# Patient Record
Sex: Male | Born: 1950 | Race: White | Hispanic: No | Smoking: Never smoker
Health system: Southern US, Community
[De-identification: ages and names within clinical notes are randomized; demographics above are authoritative.]

## PROBLEM LIST (undated history)

## (undated) DIAGNOSIS — K219 Gastro-esophageal reflux disease without esophagitis: Secondary | ICD-10-CM

## (undated) DIAGNOSIS — Z8673 Personal history of transient ischemic attack (TIA), and cerebral infarction without residual deficits: Secondary | ICD-10-CM

## (undated) DIAGNOSIS — I2510 Atherosclerotic heart disease of native coronary artery without angina pectoris: Secondary | ICD-10-CM

## (undated) DIAGNOSIS — F4310 Post-traumatic stress disorder, unspecified: Secondary | ICD-10-CM

## (undated) DIAGNOSIS — F319 Bipolar disorder, unspecified: Secondary | ICD-10-CM

## (undated) HISTORY — PX: CARDIAC CATHETERIZATION: SHX172

---

## 1999-12-12 ENCOUNTER — Encounter: Payer: Self-pay | Admitting: Cardiology

## 1999-12-12 ENCOUNTER — Ambulatory Visit (HOSPITAL_COMMUNITY): Admission: RE | Admit: 1999-12-12 | Discharge: 1999-12-12 | Payer: Self-pay | Admitting: Cardiology

## 2001-02-28 ENCOUNTER — Encounter: Payer: Self-pay | Admitting: Cardiology

## 2001-02-28 ENCOUNTER — Ambulatory Visit (HOSPITAL_COMMUNITY): Admission: RE | Admit: 2001-02-28 | Discharge: 2001-02-28 | Payer: Self-pay | Admitting: Cardiology

## 2001-03-01 ENCOUNTER — Ambulatory Visit (HOSPITAL_COMMUNITY): Admission: RE | Admit: 2001-03-01 | Discharge: 2001-03-01 | Payer: Self-pay | Admitting: Cardiology

## 2004-03-17 ENCOUNTER — Encounter: Admission: RE | Admit: 2004-03-17 | Discharge: 2004-03-17 | Payer: Self-pay | Admitting: Cardiovascular Disease

## 2004-09-13 ENCOUNTER — Emergency Department: Payer: Self-pay | Admitting: Unknown Physician Specialty

## 2004-09-13 ENCOUNTER — Other Ambulatory Visit: Payer: Self-pay

## 2004-09-18 ENCOUNTER — Encounter: Admission: RE | Admit: 2004-09-18 | Discharge: 2004-09-18 | Payer: Self-pay | Admitting: Cardiology

## 2005-02-05 ENCOUNTER — Encounter: Admission: RE | Admit: 2005-02-05 | Discharge: 2005-02-05 | Payer: Self-pay | Admitting: Cardiology

## 2005-10-23 ENCOUNTER — Emergency Department: Payer: Self-pay | Admitting: Unknown Physician Specialty

## 2009-01-28 ENCOUNTER — Inpatient Hospital Stay (HOSPITAL_COMMUNITY): Admission: EM | Admit: 2009-01-28 | Discharge: 2009-01-31 | Payer: Self-pay | Admitting: Emergency Medicine

## 2009-01-29 ENCOUNTER — Encounter (INDEPENDENT_AMBULATORY_CARE_PROVIDER_SITE_OTHER): Payer: Self-pay | Admitting: Cardiology

## 2009-01-30 ENCOUNTER — Encounter (INDEPENDENT_AMBULATORY_CARE_PROVIDER_SITE_OTHER): Payer: Self-pay | Admitting: Cardiovascular Disease

## 2009-01-31 ENCOUNTER — Encounter (INDEPENDENT_AMBULATORY_CARE_PROVIDER_SITE_OTHER): Payer: Self-pay | Admitting: Cardiology

## 2009-01-31 ENCOUNTER — Ambulatory Visit: Payer: Self-pay | Admitting: Vascular Surgery

## 2009-06-02 ENCOUNTER — Inpatient Hospital Stay (HOSPITAL_COMMUNITY): Admission: EM | Admit: 2009-06-02 | Discharge: 2009-06-02 | Payer: Self-pay | Admitting: Emergency Medicine

## 2010-02-23 ENCOUNTER — Encounter: Payer: Self-pay | Admitting: Neurology

## 2010-04-22 LAB — CBC
HCT: 43.4 % (ref 39.0–52.0)
Hemoglobin: 14.9 g/dL (ref 13.0–17.0)
MCHC: 34.5 g/dL (ref 30.0–36.0)
MCHC: 34.8 g/dL (ref 30.0–36.0)
MCV: 94.1 fL (ref 78.0–100.0)
RBC: 4.83 MIL/uL (ref 4.22–5.81)
RDW: 12.5 % (ref 11.5–15.5)
WBC: 6.5 10*3/uL (ref 4.0–10.5)
WBC: 9 10*3/uL (ref 4.0–10.5)

## 2010-04-22 LAB — GLUCOSE, CAPILLARY

## 2010-04-22 LAB — COMPREHENSIVE METABOLIC PANEL
AST: 20 U/L (ref 0–37)
Alkaline Phosphatase: 54 U/L (ref 39–117)
BUN: 28 mg/dL — ABNORMAL HIGH (ref 6–23)
Calcium: 8.9 mg/dL (ref 8.4–10.5)
Creatinine, Ser: 1.09 mg/dL (ref 0.4–1.5)
GFR calc non Af Amer: >60 mL/min (ref >60)
Glucose, Bld: 109 mg/dL — ABNORMAL HIGH (ref 70–99)
Sodium: 142 mEq/L (ref 135–145)

## 2010-04-22 LAB — DIFFERENTIAL
Lymphocytes Relative: 30 % (ref 12–46)
Lymphs Abs: 2.7 10*3/uL (ref 0.7–4.0)
Neutro Abs: 5.2 10*3/uL (ref 1.7–7.7)
Neutrophils Relative %: 58 % (ref 43–77)

## 2010-04-22 LAB — POCT CARDIAC MARKERS
CKMB, poc: <1 ng/mL — ABNORMAL LOW (ref 1.0–8.0)
Troponin i, poc: <0.05 ng/mL (ref 0.00–0.09)

## 2010-04-22 LAB — BASIC METABOLIC PANEL
BUN: 29 mg/dL — ABNORMAL HIGH (ref 6–23)
Chloride: 109 mEq/L (ref 96–112)
GFR calc Af Amer: >60 mL/min (ref >60)
Glucose, Bld: 109 mg/dL — ABNORMAL HIGH (ref 70–99)
Potassium: 3.8 mEq/L (ref 3.5–5.1)

## 2010-04-22 LAB — MAGNESIUM: Magnesium: 2.2 mg/dL (ref 1.5–2.5)

## 2010-05-05 LAB — COMPREHENSIVE METABOLIC PANEL
ALT: 27 U/L (ref 0–53)
ALT: 29 U/L (ref 0–53)
AST: 16 U/L (ref 0–37)
Albumin: 3.2 g/dL — ABNORMAL LOW (ref 3.5–5.2)
Alkaline Phosphatase: 40 U/L (ref 39–117)
Alkaline Phosphatase: 42 U/L (ref 39–117)
Calcium: 8.6 mg/dL (ref 8.4–10.5)
Chloride: 104 mEq/L (ref 96–112)
GFR calc Af Amer: >60 mL/min (ref >60)
Glucose, Bld: 146 mg/dL — ABNORMAL HIGH (ref 70–99)
Potassium: 3.5 mEq/L (ref 3.5–5.1)
Potassium: 3.7 mEq/L (ref 3.5–5.1)
Sodium: 141 mEq/L (ref 135–145)
Sodium: 142 mEq/L (ref 135–145)
Total Bilirubin: 0.6 mg/dL (ref 0.3–1.2)
Total Protein: 5.1 g/dL — ABNORMAL LOW (ref 6.0–8.3)
Total Protein: 5.5 g/dL — ABNORMAL LOW (ref 6.0–8.3)

## 2010-05-05 LAB — POCT I-STAT, CHEM 8
Creatinine, Ser: 1 mg/dL (ref 0.4–1.5)
Glucose, Bld: 100 mg/dL — ABNORMAL HIGH (ref 70–99)
HCT: 43 % (ref 39.0–52.0)
Hemoglobin: 14.6 g/dL (ref 13.0–17.0)
Potassium: 3.7 mEq/L (ref 3.5–5.1)
Sodium: 142 mEq/L (ref 135–145)
TCO2: 27 mmol/L (ref 0–100)

## 2010-05-05 LAB — LIPID PANEL
Cholesterol: 155 mg/dL (ref 0–200)
HDL: 43 mg/dL (ref >39)
LDL Cholesterol: 89 mg/dL (ref 0–99)
Total CHOL/HDL Ratio: 3.6 RATIO
Triglycerides: 117 mg/dL (ref <150)

## 2010-05-05 LAB — CBC
MCHC: 34.2 g/dL (ref 30.0–36.0)
RDW: 13.1 % (ref 11.5–15.5)

## 2010-05-05 LAB — PROTIME-INR: INR: 1.06 (ref 0.00–1.49)

## 2010-05-05 LAB — HEMOGLOBIN A1C: Hgb A1c MFr Bld: 5.9 % (ref 4.6–6.1)

## 2010-05-05 LAB — APTT: aPTT: 27 seconds (ref 24–37)

## 2010-06-20 NOTE — Cardiovascular Report (Signed)
[place]  Patient:    Austin Rhodes, Austin Rhodes Visit Number: [id number] MRN: [id number]          Service Type: CAT Location: [facility] 2874 [id number] Attending Physician:  Robynn Pane Dictated by:   Eduardo Osier Sharyn Lull, M.D. Proc. Date: 03/01/01 Admit Date:  03/01/2001 Discharge Date: 03/01/2001   CC:         Cardiac Catheterization Laboratory   Cardiac Catheterization  PROCEDURE: Left cardiac catheterization with selective right and left coronary angiography, left ventriculography via right groin using Judkins technique.  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 Denies any nausea, vomiting or diaphoresis. Denies PND, orthopnea, leg swelling. Denies palpitations, lightheadedness or syncope lately. States lately he gets short of breath and tired with minimal exertion. The patient underwent stress Cardiolite exercise for 9 minutes on Bruce protocol, complaints of retrosternal chest pain at peak of exercise. Peak heart rate achieved was 156 and peak blood pressure was 180/100. ECG showed minor ST depression in inferior leads. Cardiolite scan showed borderline inducible reversible ischemia in the anterior wall with EF of 65%. Due to recurrent typical anginal pain and mildly positive stress Cardiolite and multiple risk factors, the patient was advised for catheterization, possible PCI.  PAST MEDICAL HISTORY: As above.  PAST SURGICAL HISTORY: He had tonsillectomy many years ago.  MEDICATIONS AT HOME: 1. He is on Cozaar 100 mg p.o. q.d. 2. Norvasc was just recently added, 5 mg p.o. q.d. 3.  Enteric-coated aspirin 1 tablet daily. 4. Nitrostat sublingual p.r.n.  ALLERGIES: He is allergic to NOVOCAINE and he has intolerance to beta blockers.  SOCIAL HISTORY: He is married, works as ______.  No history of smoking nor alcohol abuse. Drinks alcohol socially.  FAMILY HISTORY: Father is alive, had three-vessel CAD with LV dysfunction and had stroke. He is hypertensive and diabetic and mother died of CV at the age of 59. Mother had hypertension. Two brothers and one sister in good health.  PHYSICAL EXAMINATION:  GENERAL: On examination, he is awake, alert and oriented x3 in no acute distress.  VITAL SIGNS: Blood pressure was 138/96, pulse was 70, regular.  HEENT: Conjunctiva pink.  NECK: Supple, no JVD, no bruits.  LUNGS: Lungs are clear to auscultation without rhonchi or rales.  CARDIOVASCULAR: S1 and S2 was normal. There was soft S4 gallop.  ABDOMEN: Soft.  Bowel sounds are present, nontender.  EXTREMITIES: There is no clubbing, cyanosis or edema.  IMPRESSION: New onset angina, positive stress Cardiolite, uncontrolled hypertension, positive family history of coronary artery disease.  Discussed with patient regarding various options of treatment, i.e., medical versus invasive, left catheterization, possible PCI, stenting, its risks, i.e., death, MI, stroke, need for emergency CABG, risk of restenosis, etc., and consented for PCI.  DESCRIPTION OF PROCEDURE: After obtaining the informed consent, the patient was brought to the catheterization lab and was placed on the fluoroscopy table.  The right groin was prepped and draped in the usual fashion. Xylocaine 2% was used for local anesthesia in the right groin. With the help of a thin-walled needle, a 6 French arterial sheath was placed. The sheath was aspirated and flushed. Next, a 6 French left Judkins catheter was advanced  over the wire under fluoroscopic guidance up to the ascending aorta. The wire was pulled out,  the catheter was aspirated and connected to the manifold. The catheter was further advanced and engaged into the left coronary ostium. Multiple views of the left system were taken.  Next, the catheter was disengaged and was pulled out over the wire and was replaced with a 6 French right Judkins catheter, which was advanced over the wire under fluoroscopic guidance up to the ascending aorta. The wire was pulled out, the catheter was aspirated and connected to the manifold. The catheter was further advanced and engaged into the right coronary ostium. Multiple views of the right system were taken.  Next, the catheter was disengaged and was pulled out over the wire and was replaced with a 6 French pigtail catheter, which was advanced over the wire under fluoroscopic guidance up to the ascending aorta. The wire was pulled out, the catheter was aspirated and connected to the manifold.  The catheter was further advanced across the aortic valve into the LV. LV pressures were recorded. Next, LV-graphy was done in 30-degree RAO position. Post-angiographic pressures were recorded from LV and then pullback pressures were recorded from the aorta. There was no gradient across the aortic valve. Next, the pigtail catheter was pulled out, the sheaths were aspirated and flushed.  FINDINGS:  LV showed good LV systolic function, EF of 60-65%.  Left main was patent.  LAD had multiple 10-15% sequential lesions in proximal and midportion. Diagonal #1 had 20-30% proximal and ostial stenosis. The vessel is small. Diagonal #2 to diagonal #4 are very small but are patent.  Left circumflex is patent proximally and tapers down in the AV groove after giving off large OM-2. OM-1 is small, which has 20-30% proximal stenosis which appears slightly hazy in one view. OM-2 is large which bifurcates into superior and inferior branches, which are patent.  RCA is patent. The patient has right dominant coronary  system.   Arteriotomy was closed with Perclose without any complications. The patient tolerated the procedure well and was transferred to recovery room in stable condition. Dictated by:   Eduardo Osier Sharyn Lull, M.D. Attending Physician:  Robynn Pane DD:  03/01/01 TD:  03/02/01 Job: [id number]

## 2011-03-01 ENCOUNTER — Other Ambulatory Visit: Payer: Self-pay | Admitting: Cardiology

## 2011-03-18 ENCOUNTER — Other Ambulatory Visit: Payer: Self-pay | Admitting: Cardiology

## 2011-06-30 ENCOUNTER — Other Ambulatory Visit: Payer: Self-pay | Admitting: Cardiology

## 2011-07-22 ENCOUNTER — Other Ambulatory Visit: Payer: Self-pay | Admitting: Cardiology

## 2015-05-09 ENCOUNTER — Emergency Department (HOSPITAL_COMMUNITY): Payer: Medicaid Other

## 2015-05-09 ENCOUNTER — Encounter (HOSPITAL_COMMUNITY): Payer: Self-pay

## 2015-05-09 ENCOUNTER — Observation Stay (HOSPITAL_COMMUNITY)
Admission: EM | Admit: 2015-05-09 | Discharge: 2015-05-10 | Disposition: A | Discharge status: Home / Self Care | Payer: Medicaid Other | Attending: Cardiology | Admitting: Cardiology

## 2015-05-09 DIAGNOSIS — R7303 Prediabetes: Secondary | ICD-10-CM | POA: Insufficient documentation

## 2015-05-09 DIAGNOSIS — D696 Thrombocytopenia, unspecified: Secondary | ICD-10-CM | POA: Diagnosis not present

## 2015-05-09 DIAGNOSIS — Z8673 Personal history of transient ischemic attack (TIA), and cerebral infarction without residual deficits: Secondary | ICD-10-CM | POA: Insufficient documentation

## 2015-05-09 DIAGNOSIS — K219 Gastro-esophageal reflux disease without esophagitis: Secondary | ICD-10-CM | POA: Insufficient documentation

## 2015-05-09 DIAGNOSIS — F4310 Post-traumatic stress disorder, unspecified: Secondary | ICD-10-CM | POA: Insufficient documentation

## 2015-05-09 DIAGNOSIS — F319 Bipolar disorder, unspecified: Secondary | ICD-10-CM | POA: Diagnosis not present

## 2015-05-09 DIAGNOSIS — R079 Chest pain, unspecified: Secondary | ICD-10-CM

## 2015-05-09 DIAGNOSIS — I25118 Atherosclerotic heart disease of native coronary artery with other forms of angina pectoris: Principal | ICD-10-CM | POA: Insufficient documentation

## 2015-05-09 DIAGNOSIS — Z79899 Other long term (current) drug therapy: Secondary | ICD-10-CM | POA: Insufficient documentation

## 2015-05-09 DIAGNOSIS — I959 Hypotension, unspecified: Secondary | ICD-10-CM

## 2015-05-09 DIAGNOSIS — I10 Essential (primary) hypertension: Secondary | ICD-10-CM | POA: Insufficient documentation

## 2015-05-09 HISTORY — DX: Atherosclerotic heart disease of native coronary artery without angina pectoris: I25.10

## 2015-05-09 HISTORY — DX: Gastro-esophageal reflux disease without esophagitis: K21.9

## 2015-05-09 HISTORY — DX: Bipolar disorder, unspecified: F31.9

## 2015-05-09 HISTORY — DX: Post-traumatic stress disorder, unspecified: F43.10

## 2015-05-09 LAB — BASIC METABOLIC PANEL
Anion gap: 7 (ref 5–15)
BUN: 16 mg/dL (ref 6–20)
CO2: 27 mmol/L (ref 22–32)
CREATININE: 1.4 mg/dL — AB (ref 0.61–1.24)
Calcium: 9.2 mg/dL (ref 8.9–10.3)
Chloride: 109 mmol/L (ref 101–111)
GFR, EST NON AFRICAN AMERICAN: 52 mL/min — AB (ref >60)
Glucose, Bld: 126 mg/dL — ABNORMAL HIGH (ref 65–99)
POTASSIUM: 4.8 mmol/L (ref 3.5–5.1)
SODIUM: 143 mmol/L (ref 135–145)

## 2015-05-09 LAB — CBG MONITORING, ED: GLUCOSE-CAPILLARY: 91 mg/dL (ref 65–99)

## 2015-05-09 LAB — CBC WITH DIFFERENTIAL/PLATELET
BASOS PCT: 0 %
Basophils Absolute: 0 10*3/uL (ref 0.0–0.1)
EOS ABS: 0.1 10*3/uL (ref 0.0–0.7)
EOS PCT: 1 %
HCT: 43.9 % (ref 39.0–52.0)
Hemoglobin: 14.2 g/dL (ref 13.0–17.0)
LYMPHS ABS: 1.3 10*3/uL (ref 0.7–4.0)
Lymphocytes Relative: 13 %
MCH: 29.6 pg (ref 26.0–34.0)
MCHC: 32.3 g/dL (ref 30.0–36.0)
MCV: 91.5 fL (ref 78.0–100.0)
Monocytes Absolute: 0.5 10*3/uL (ref 0.1–1.0)
Monocytes Relative: 5 %
NEUTROS PCT: 81 %
Neutro Abs: 7.9 10*3/uL — ABNORMAL HIGH (ref 1.7–7.7)
PLATELETS: 122 10*3/uL — AB (ref 150–400)
RBC: 4.8 MIL/uL (ref 4.22–5.81)
RDW: 13.3 % (ref 11.5–15.5)
WBC: 9.7 10*3/uL (ref 4.0–10.5)

## 2015-05-09 LAB — CBC
HEMATOCRIT: 40.9 % (ref 39.0–52.0)
HEMOGLOBIN: 13.2 g/dL (ref 13.0–17.0)
MCH: 29.5 pg (ref 26.0–34.0)
MCHC: 32.3 g/dL (ref 30.0–36.0)
MCV: 91.5 fL (ref 78.0–100.0)
Platelets: 123 10*3/uL — ABNORMAL LOW (ref 150–400)
RBC: 4.47 MIL/uL (ref 4.22–5.81)
RDW: 13.2 % (ref 11.5–15.5)
WBC: 8.1 10*3/uL (ref 4.0–10.5)

## 2015-05-09 LAB — VALPROIC ACID LEVEL: VALPROIC ACID LVL: 65 ug/mL (ref 50.0–100.0)

## 2015-05-09 LAB — PROTIME-INR
INR: 1.21 (ref 0.00–1.49)
PROTHROMBIN TIME: 15.5 s — AB (ref 11.6–15.2)

## 2015-05-09 LAB — COMPREHENSIVE METABOLIC PANEL
ALBUMIN: 2.9 g/dL — AB (ref 3.5–5.0)
ALK PHOS: 39 U/L (ref 38–126)
ALT: 11 U/L — AB (ref 17–63)
AST: 14 U/L — ABNORMAL LOW (ref 15–41)
Anion gap: 9 (ref 5–15)
BILIRUBIN TOTAL: 0.4 mg/dL (ref 0.3–1.2)
BUN: 15 mg/dL (ref 6–20)
CALCIUM: 8.8 mg/dL — AB (ref 8.9–10.3)
CO2: 24 mmol/L (ref 22–32)
CREATININE: 1.32 mg/dL — AB (ref 0.61–1.24)
Chloride: 108 mmol/L (ref 101–111)
GFR calc Af Amer: >60 mL/min (ref >60)
GFR calc non Af Amer: 56 mL/min — ABNORMAL LOW (ref >60)
GLUCOSE: 137 mg/dL — AB (ref 65–99)
Potassium: 3.9 mmol/L (ref 3.5–5.1)
SODIUM: 141 mmol/L (ref 135–145)
TOTAL PROTEIN: 4.8 g/dL — AB (ref 6.5–8.1)

## 2015-05-09 LAB — TROPONIN I: Troponin I: <0.03 ng/mL (ref <0.031)

## 2015-05-09 LAB — RAPID URINE DRUG SCREEN, HOSP PERFORMED
Amphetamines: NOT DETECTED
BARBITURATES: NOT DETECTED
BENZODIAZEPINES: NOT DETECTED
COCAINE: NOT DETECTED
OPIATES: NOT DETECTED
TETRAHYDROCANNABINOL: NOT DETECTED

## 2015-05-09 LAB — URINALYSIS, DIPSTICK ONLY
Bilirubin Urine: NEGATIVE
Glucose, UA: NEGATIVE mg/dL
Hgb urine dipstick: NEGATIVE
KETONES UR: NEGATIVE mg/dL
LEUKOCYTES UA: NEGATIVE
NITRITE: NEGATIVE
PH: 6.5 (ref 5.0–8.0)
Protein, ur: NEGATIVE mg/dL
SPECIFIC GRAVITY, URINE: 1.02 (ref 1.005–1.030)

## 2015-05-09 LAB — LACTIC ACID, PLASMA
Lactic Acid, Venous: 1.4 mmol/L (ref 0.5–2.0)
Lactic Acid, Venous: 1.9 mmol/L (ref 0.5–2.0)

## 2015-05-09 LAB — I-STAT TROPONIN, ED: Troponin i, poc: 0 ng/mL (ref 0.00–0.08)

## 2015-05-09 LAB — APTT: aPTT: 189 seconds — ABNORMAL HIGH (ref 24–37)

## 2015-05-09 LAB — MAGNESIUM: Magnesium: 2.1 mg/dL (ref 1.7–2.4)

## 2015-05-09 MED ORDER — HEPARIN (PORCINE) IN NACL 100-0.45 UNIT/ML-% IJ SOLN
1150.0000 [IU]/h | INTRAMUSCULAR | Status: DC
  Administered 2015-05-09: 1150 [IU]/h via INTRAVENOUS
  Filled 2015-05-09: qty 250

## 2015-05-09 MED ORDER — ASPIRIN 300 MG RE SUPP
300.0000 mg | RECTAL | Status: AC
  Filled 2015-05-09: qty 1

## 2015-05-09 MED ORDER — ASPIRIN 81 MG PO CHEW
324.0000 mg | CHEWABLE_TABLET | ORAL | Status: AC
  Administered 2015-05-09: 324 mg via ORAL
  Filled 2015-05-09: qty 4

## 2015-05-09 MED ORDER — METOPROLOL TARTRATE 12.5 MG HALF TABLET
12.5000 mg | ORAL_TABLET | Freq: Two times a day (BID) | ORAL | Status: DC
  Administered 2015-05-09 – 2015-05-10 (×2): 12.5 mg via ORAL
  Filled 2015-05-09 (×2): qty 1

## 2015-05-09 MED ORDER — HEPARIN SODIUM (PORCINE) 5000 UNIT/ML IJ SOLN
5000.0000 [IU] | Freq: Three times a day (TID) | INTRAMUSCULAR | Status: DC
  Administered 2015-05-10: 5000 [IU] via SUBCUTANEOUS
  Filled 2015-05-09 (×2): qty 1

## 2015-05-09 MED ORDER — ONDANSETRON HCL 4 MG/2ML IJ SOLN: 4.0000 mg | Freq: Four times a day (QID) | INTRAMUSCULAR | Status: DC | PRN

## 2015-05-09 MED ORDER — HEPARIN BOLUS VIA INFUSION
4000.0000 [IU] | Freq: Once | INTRAVENOUS | Status: AC
  Administered 2015-05-09: 4000 [IU] via INTRAVENOUS
  Filled 2015-05-09: qty 4000

## 2015-05-09 MED ORDER — NITROGLYCERIN 0.4 MG SL SUBL: 0.4000 mg | SUBLINGUAL_TABLET | SUBLINGUAL | Status: DC | PRN

## 2015-05-09 MED ORDER — DIVALPROEX SODIUM ER 500 MG PO TB24
500.0000 mg | ORAL_TABLET | Freq: Two times a day (BID) | ORAL | Status: DC
  Administered 2015-05-09 – 2015-05-10 (×2): 500 mg via ORAL
  Filled 2015-05-09 (×2): qty 1

## 2015-05-09 MED ORDER — ROSUVASTATIN CALCIUM 10 MG PO TABS
10.0000 mg | ORAL_TABLET | Freq: Every day | ORAL | Status: DC
  Administered 2015-05-09 – 2015-05-10 (×2): 10 mg via ORAL
  Filled 2015-05-09 (×2): qty 1

## 2015-05-09 MED ORDER — SODIUM CHLORIDE 0.9 % IV SOLN
INTRAVENOUS | Status: DC
  Administered 2015-05-09: 17:00:00 via INTRAVENOUS

## 2015-05-09 MED ORDER — QUETIAPINE FUMARATE ER 200 MG PO TB24
200.0000 mg | ORAL_TABLET | Freq: Every day | ORAL | Status: DC
  Administered 2015-05-09: 200 mg via ORAL
  Filled 2015-05-09 (×2): qty 1

## 2015-05-09 MED ORDER — ACETAMINOPHEN 325 MG PO TABS: 650.0000 mg | ORAL_TABLET | ORAL | Status: DC | PRN

## 2015-05-09 MED ORDER — ASPIRIN EC 81 MG PO TBEC
81.0000 mg | DELAYED_RELEASE_TABLET | Freq: Every day | ORAL | Status: DC
  Administered 2015-05-10: 81 mg via ORAL
  Filled 2015-05-09: qty 1

## 2015-05-09 NOTE — ED Notes (Signed)
Pt states he has not been feeling well for several days.  C/o body aches yesterday.  States today was sitting at [employer] and he felt as if he was going to pass out, so laid down.  When EMS arrived, pt was pale, diaphoretic and c/o central chest pressure and R sided neck stiffness.  SBP was in 70's, O2 sats 72% ekg unremarkable, cbg 121.  Given 700 NS with bp improvement to sbp in 90's. Rectal temp 96.7.

## 2015-05-09 NOTE — ED Notes (Signed)
Pt given sandwich pack and ginger ale per MD approval.

## 2015-05-09 NOTE — Progress Notes (Signed)
ANTICOAGULATION CONSULT NOTE - Initial Consult  Pharmacy Consult for Heparin  Indication: chest pain/ACS  No Known Allergies  Patient Measurements: Weight: 195 lb (88.451 kg) (Patient reported ) Heparin Dosing Weight:   Vital Signs: Temp: 96.7 F (35.9 C) (04/06 1221) Temp Source: Rectal (04/06 1221) BP: 94/61 mmHg (04/06 1500) Pulse Rate: 69 (04/06 1500)  Labs:  Recent Labs  05/09/15 1235  HGB 14.2  HCT 43.9  PLT 122*  CREATININE 1.40*    CrCl cannot be calculated (Unknown ideal weight.).   Medical History: Past Medical History  Diagnosis Date  . PTSD (post-traumatic stress disorder)   . Coronary artery disease     Medications:   (Not in a hospital admission)  Assessment: [age] YOM who presented with chest pain and mild diaphoresis. Pharmacy consulted to start IV heparin for angina with rule out STEMI. H/H wnl. Plt low. He is not on any anticoagulation prior to admission   Goal of Therapy:  Heparin level 0.3-0.7 units/ml Monitor platelets by anticoagulation protocol: Yes   Plan:  -Heparin 4000 units IV heparin bolus followed by IV heparin at 1150 units/hr  -F/u 6 hr HL -Monitor CBC, renal fx, and s/s of bleeding    Vinnie LevelBenjamin Sherryn Pollino, PharmD., BCPS Clinical Pharmacist Pager [phone]

## 2015-05-09 NOTE — Progress Notes (Signed)
Pt a/o, no c/o pain, pt ambulated in hallway, pt frustrated IV pump was beeping and stated "it bothers me, I have PTSD, can we turn it off?" Dr. Sharyn LullHarwani paged and said OK to turn off and ordered SQ heparin to be started, pt now resting comfortably in bed, pt stable

## 2015-05-09 NOTE — ED Notes (Signed)
Attempted report 

## 2015-05-09 NOTE — ED Notes (Signed)
MD at bedside. 

## 2015-05-09 NOTE — ED Provider Notes (Signed)
CSN: [id number]     Arrival date & time 05/09/15  1157 History   First MD Initiated Contact with Patient 05/09/15 1215     Chief Complaint  Patient presents with  . Near Syncope  . Chest Pain     (Consider location/radiation/quality/duration/timing/severity/associated sxs/prior Treatment) Patient is a 65 y.o. male presenting with near-syncope and chest pain. The history is provided by the patient and a relative (daughter).  Near Syncope This is a recurrent (hx of vasovagal syncope) problem. The current episode started today. The problem has been resolved. Associated symptoms include chest pain, fatigue, myalgias and weakness. Pertinent negatives include no abdominal pain, diaphoresis, fever, headaches, nausea, rash, sore throat or vomiting. Treatments tried: IVF. The treatment provided significant relief.  Chest Pain Pain location:  Substernal area Pain quality: pressure   Pain radiates to:  L shoulder Pain radiates to the back: no   Pain severity:  Moderate Onset quality:  Sudden Duration:  45 minutes Timing:  Constant Progression:  Partially resolved Chronicity:  Recurrent Context: at rest   Associated symptoms: fatigue, near-syncope and weakness   Associated symptoms: no abdominal pain, no back pain, no diaphoresis, no dizziness, no dysphagia, no fever, no headache, no nausea, no palpitations, no shortness of breath and not vomiting     Past Medical History  Diagnosis Date  . PTSD (post-traumatic stress disorder)   . Coronary artery disease    Past Surgical History  Procedure Laterality Date  . Cardiac catheterization     History reviewed. No pertinent family history. Social History  Substance Use Topics  . Smoking status: Never Smoker   . Smokeless tobacco: None  . Alcohol Use: No    Review of Systems  Constitutional: Positive for fatigue. Negative for fever, diaphoresis, activity change and appetite change.  HENT: Negative for facial swelling, sore throat,  tinnitus, trouble swallowing and voice change.   Eyes: Negative for pain, redness and visual disturbance.  Respiratory: Negative for chest tightness, shortness of breath and wheezing.   Cardiovascular: Positive for chest pain and near-syncope. Negative for palpitations and leg swelling.  Gastrointestinal: Negative for nausea, vomiting, abdominal pain, diarrhea, constipation and abdominal distention.  Endocrine: Negative.   Genitourinary: Negative.  Negative for dysuria, decreased urine volume, scrotal swelling and testicular pain.  Musculoskeletal: Positive for myalgias. Negative for back pain and gait problem.  Skin: Negative.  Negative for rash.  Neurological: Positive for syncope and weakness. Negative for dizziness, tremors and headaches.  Psychiatric/Behavioral: Negative for suicidal ideas, hallucinations and self-injury. The patient is not nervous/anxious.       Allergies  Review of patient's allergies indicates no known allergies.  Home Medications   Prior to Admission medications   Medication Sig Start Date End Date Taking? Authorizing Provider  amLODipine (NORVASC) 5 MG tablet Take 5 mg by mouth daily.   Yes Historical Provider, MD  B Complex-Biotin-FA (B COMPLETE PO) Take 1 tablet by mouth daily.   Yes Historical Provider, MD  divalproex (DEPAKOTE ER) 500 MG 24 hr tablet Take 500 mg by mouth 2 (two) times daily. 05/07/15  Yes Historical Provider, MD  lamoTRIgine (LAMICTAL) 200 MG tablet Take 200 mg by mouth daily. 04/08/15  Yes Historical Provider, MD  losartan (COZAAR) 100 MG tablet Take 100 mg by mouth daily.   Yes Historical Provider, MD  metoprolol succinate (TOPROL-XL) 25 MG 24 hr tablet Take 25 mg by mouth daily.   Yes Historical Provider, MD  Multiple Vitamin (MULTIVITAMIN WITH MINERALS) TABS tablet Take 1 tablet  by mouth daily.   Yes Historical Provider, MD  rosuvastatin (CRESTOR) 10 MG tablet Take 10 mg by mouth daily.   Yes Historical Provider, MD  SEROQUEL XR 200 MG 24  hr tablet Take 200 mg by mouth at bedtime.  03/29/15  Yes Historical Provider, MD  tamsulosin (FLOMAX) 0.4 MG CAPS capsule Take 0.4 mg by mouth daily.   Yes Historical Provider, MD   BP 108/63 mmHg  Pulse 79  Temp(Src) 96.7 F (35.9 C) (Rectal)  Resp 22  SpO2 96% Physical Exam  Constitutional: He is oriented to person, place, and time. He appears well-developed and well-nourished. No distress.  HENT:  Head: Normocephalic and atraumatic.  Right Ear: External ear normal.  Left Ear: External ear normal.  Nose: Nose normal.  Eyes: Conjunctivae and EOM are normal. Pupils are equal, round, and reactive to light. No scleral icterus.  Neck: Normal range of motion. Neck supple. No JVD present. No tracheal deviation present. No thyromegaly present.  Cardiovascular: Normal rate, regular rhythm and intact distal pulses.   Pulmonary/Chest: Effort normal and breath sounds normal. No stridor. No respiratory distress. He has no wheezes. He has no rales.  Abdominal: Soft. He exhibits no distension. There is no tenderness. There is no rebound and no guarding.  Musculoskeletal: Normal range of motion. He exhibits no edema or tenderness.  Neurological: He is alert and oriented to person, place, and time. No cranial nerve deficit. He exhibits normal muscle tone. Coordination normal.  Skin: Skin is warm and dry. No rash noted. He is not diaphoretic.  Psychiatric: He has a normal mood and affect. His behavior is normal.  Nursing note and vitals reviewed.   ED Course  Procedures (including critical care time) Labs Review Labs Reviewed  CBC WITH DIFFERENTIAL/PLATELET - Abnormal; Notable for the following:    Platelets 122 (*)    Neutro Abs 7.9 (*)    All other components within normal limits  BASIC METABOLIC PANEL - Abnormal; Notable for the following:    Glucose, Bld 126 (*)    Creatinine, Ser 1.40 (*)    GFR calc non Af Amer 52 (*)    All other components within normal limits  LACTIC ACID, PLASMA   LACTIC ACID, PLASMA  URINALYSIS, DIPSTICK ONLY  URINE RAPID DRUG SCREEN, HOSP PERFORMED  CBG MONITORING, ED  Rosezena Sensor, ED    Imaging Review Dg Chest 2 View  05/09/2015  CLINICAL DATA:  Chest pain, PTSD EXAM: CHEST  2 VIEW COMPARISON:  06/01/2009 FINDINGS: Cardiomediastinal silhouette is stable. No acute infiltrate or pleural effusion. No pulmonary edema. Minimal degenerative changes mid thoracic spine. IMPRESSION: No active cardiopulmonary disease. Electronically Signed   By: Natasha Mead M.D.   On: 05/09/2015 13:18   I have personally reviewed and evaluated these images and lab results as part of my medical decision-making.   EKG Interpretation   Date/Time:  Thursday May 09 2015 12:10:12 EDT Ventricular Rate:  64 PR Interval:  190 QRS Duration: 98 QT Interval:  412 QTC Calculation: 425 R Axis:   83 Text Interpretation:  Normal sinus rhythm notching inferior leads  Confirmed by RAY MD, Duwayne Heck [phone]) on 05/09/2015 2:32:46 PM      MDM   Final diagnoses:  Chest pain, unspecified chest pain type  Hypotension, unspecified hypotension type    The patient is a 65 year old male with a history of bipolar disorder, previous episodes of vasovagal syncope, and mild coronary artery disease with previous catheterizations without PCI who presents with a  couple days of generalized mobilize and then near syncopal episode with chest pain earlier today. Patient was hypotensive with poor oxygen saturation per EMS however both of these problems resolved with 700 mL of normal saline. On arrival to the emergency department the patient is afebrile hemolytically stable. GCS of 15. Blood pressure of 112/75 on arrival. The patient received aspirin in route and reports significant improvement in his chest pain. EKG shows sinus rhythm with possible fascicular block and inferior leads but is otherwise unremarkable. Original troponin is undetectable. I spoke to the patient's cardiologist Dr. Sharyn Lull  who agrees to admit the patient for ACS rule out and further management of his hypertension. The patient has daughter expressed understanding and agreement with this plan.  Patient seen with attending, Dr. Rosalia Hammers, who oversaw clinical decision making.    Lula Olszewski, MD 05/09/15 1437  Margarita Grizzle, MD 05/09/15 [phone]

## 2015-05-09 NOTE — H&P (Signed)
Austin Rhodes is an 65 y.o. male.   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 Patient denies any syncopal episode. Patient states chest pain radiating to the left shoulder associated with mild diaphoresis patient was noted to be hypotensive with blood pressure in 70s by EMS received IV fluids with improvement in his blood pressure EKG done in the ED showed normal sinus rhythm with no acute ischemic changes first set of troponin I is normal. Patient denies any weakness in the arms or legs denies any slurred speech. Denies any recent changes in the medications. States lately he feels started and fatigue no energy.  Past Medical History  Diagnosis Date  . PTSD (post-traumatic stress disorder)   . Coronary artery disease     Past Surgical History  Procedure Laterality Date  . Cardiac catheterization      History reviewed. No pertinent family history. Social History:  reports that he has never smoked. He does not have any smokeless tobacco history on file. He reports that he does not drink alcohol. His drug history is not on file.  Allergies: No Known Allergies   (Not in a hospital admission)  Results for orders placed or performed during the hospital encounter of 05/09/15 (from the past 48 hour(s))  CBC with Differential     Status: Abnormal   Collection Time: 05/09/15 12:35 PM  Result Value Ref Range   WBC 9.7 4.0 - 10.5 K/uL   RBC 4.80 4.22 - 5.81 MIL/uL   Hemoglobin 14.2 13.0 - 17.0  g/dL   HCT 43.9 39.0 - 52.0 %   MCV 91.5 78.0 - 100.0 fL   MCH 29.6 26.0 - 34.0 pg   MCHC 32.3 30.0 - 36.0 g/dL   RDW 13.3 11.5 - 15.5 %   Platelets 122 (L) 150 - 400 K/uL   Neutrophils Relative % 81 %   Neutro Abs 7.9 (H) 1.7 - 7.7 K/uL   Lymphocytes Relative 13 %   Lymphs Abs 1.3 0.7 - 4.0 K/uL   Monocytes Relative 5 %   Monocytes Absolute 0.5 0.1 - 1.0 K/uL   Eosinophils Relative 1 %   Eosinophils Absolute 0.1 0.0 - 0.7 K/uL   Basophils Relative 0 %   Basophils Absolute 0.0 0.0 - 0.1 K/uL  Basic metabolic panel     Status: Abnormal   Collection Time: 05/09/15 12:35 PM  Result Value Ref Range   Sodium 143 135 - 145 mmol/L   Potassium 4.8 3.5 - 5.1 mmol/L   Chloride 109 101 - 111 mmol/L   CO2 27 22 - 32 mmol/L   Glucose, Bld 126 (H) 65 - 99 mg/dL   BUN 16 6 - 20 mg/dL   Creatinine, Ser 1.40 (H) 0.61 - 1.24 mg/dL   Calcium 9.2 8.9 - 10.3 mg/dL   GFR calc non Af Amer 52 (L) >60 mL/min   GFR calc Af Amer >60 >60 mL/min    Comment: (NOTE) The eGFR has been calculated using the CKD EPI equation. This calculation has not been validated in all clinical situations. eGFR's persistently <  60 mL/min signify possible Chronic Kidney Disease.    Anion gap 7 5 - 15  POC CBG, ED     Status: None   Collection Time: 05/09/15 12:42 PM  Result Value Ref Range   Glucose-Capillary 91 65 - 99 mg/dL  I-stat troponin, ED     Status: None   Collection Time: 05/09/15 12:44 PM  Result Value Ref Range   Troponin i, poc 0.00 0.00 - 0.08 ng/mL   Comment 3            Comment: Due to the release kinetics of cTnI, a negative result within the first hours of the onset of symptoms does not rule out myocardial infarction with certainty. If myocardial infarction is still suspected, repeat the test at appropriate intervals.    Dg Chest 2 View  05/09/2015  CLINICAL DATA:  Chest pain, PTSD EXAM: CHEST  2 VIEW COMPARISON:  06/01/2009 FINDINGS: Cardiomediastinal silhouette is stable. No acute infiltrate  or pleural effusion. No pulmonary edema. Minimal degenerative changes mid thoracic spine. IMPRESSION: No active cardiopulmonary disease. Electronically Signed   By: Lahoma Crocker M.D.   On: 05/09/2015 13:18    Review of Systems  Constitutional: Positive for malaise/fatigue and diaphoresis. Negative for fever and chills.  Eyes: Negative for double vision and photophobia.  Respiratory: Negative for shortness of breath.   Cardiovascular: Positive for chest pain. Negative for palpitations, orthopnea, claudication and leg swelling.  Gastrointestinal: Negative for nausea, vomiting and abdominal pain.  Genitourinary: Negative for dysuria.  Neurological: Positive for dizziness. Negative for headaches.    Blood pressure 108/63, pulse 79, temperature 96.7 F (35.9 C), temperature source Rectal, resp. rate 22, SpO2 96 %. Physical Exam  Constitutional: He is oriented to person, place, and time.  HENT:  Head: Normocephalic and atraumatic.  Eyes: Conjunctivae are normal. Pupils are equal, round, and reactive to light. Left eye exhibits no discharge. No scleral icterus.  Neck: Normal range of motion. Neck supple. No JVD present. No tracheal deviation present. No thyromegaly present.  Cardiovascular: Normal rate and regular rhythm.   Murmur (Soft systolic murmur noted no S3 gallop) heard. Respiratory: Effort normal and breath sounds normal. No respiratory distress. He has no wheezes. He has no rales.  GI: Bowel sounds are normal. He exhibits no distension. There is no tenderness. There is no rebound.  Musculoskeletal: He exhibits no edema or tenderness.  Neurological: He is alert and oriented to person, place, and time.     Assessment/Plan Chest pain worrisome for angina rule out MI Status post hypotension probably secondary to meds History of mild CAD in remote past Hypertension History of CVA in the past History of vasovagal syncope in the past GERD Bipolar disorder Posttraumatic stress  disorder Plan As per orders   Charolette Forward, MD 05/09/2015, 2:48 PM

## 2015-05-10 ENCOUNTER — Observation Stay (HOSPITAL_COMMUNITY): Payer: Medicaid Other

## 2015-05-10 DIAGNOSIS — I10 Essential (primary) hypertension: Secondary | ICD-10-CM | POA: Diagnosis not present

## 2015-05-10 DIAGNOSIS — R7303 Prediabetes: Secondary | ICD-10-CM | POA: Diagnosis not present

## 2015-05-10 DIAGNOSIS — I25118 Atherosclerotic heart disease of native coronary artery with other forms of angina pectoris: Secondary | ICD-10-CM | POA: Diagnosis not present

## 2015-05-10 DIAGNOSIS — F319 Bipolar disorder, unspecified: Secondary | ICD-10-CM | POA: Diagnosis not present

## 2015-05-10 LAB — TROPONIN I: Troponin I: <0.03 ng/mL (ref <0.031)

## 2015-05-10 LAB — CBC
HCT: 41.1 % (ref 39.0–52.0)
Hemoglobin: 13.2 g/dL (ref 13.0–17.0)
MCH: 29.3 pg (ref 26.0–34.0)
MCHC: 32.1 g/dL (ref 30.0–36.0)
MCV: 91.3 fL (ref 78.0–100.0)
PLATELETS: 122 10*3/uL — AB (ref 150–400)
RBC: 4.5 MIL/uL (ref 4.22–5.81)
RDW: 13.3 % (ref 11.5–15.5)
WBC: 6.6 10*3/uL (ref 4.0–10.5)

## 2015-05-10 LAB — BASIC METABOLIC PANEL
ANION GAP: 9 (ref 5–15)
BUN: 14 mg/dL (ref 6–20)
CO2: 25 mmol/L (ref 22–32)
Calcium: 8.8 mg/dL — ABNORMAL LOW (ref 8.9–10.3)
Chloride: 106 mmol/L (ref 101–111)
Creatinine, Ser: 1.1 mg/dL (ref 0.61–1.24)
Glucose, Bld: 99 mg/dL (ref 65–99)
POTASSIUM: 4 mmol/L (ref 3.5–5.1)
SODIUM: 140 mmol/L (ref 135–145)

## 2015-05-10 LAB — LIPID PANEL
CHOL/HDL RATIO: 2.4 ratio
CHOLESTEROL: 93 mg/dL (ref 0–200)
HDL: 39 mg/dL — AB (ref >40)
LDL Cholesterol: 39 mg/dL (ref 0–99)
TRIGLYCERIDES: 75 mg/dL (ref <150)
VLDL: 15 mg/dL (ref 0–40)

## 2015-05-10 LAB — LAMOTRIGINE LEVEL: Lamotrigine Lvl: 12.9 ug/mL (ref 2.0–20.0)

## 2015-05-10 LAB — HEPARIN LEVEL (UNFRACTIONATED): Heparin Unfractionated: <0.1 IU/mL — ABNORMAL LOW (ref 0.30–0.70)

## 2015-05-10 MED ORDER — CLOPIDOGREL BISULFATE 75 MG PO TABS: 75.0000 mg | ORAL_TABLET | Freq: Every day | ORAL | Status: DC

## 2015-05-10 MED ORDER — TECHNETIUM TC 99M SESTAMIBI GENERIC - CARDIOLITE
30.0000 | Freq: Once | INTRAVENOUS | Status: AC | PRN
  Administered 2015-05-10: 30 via INTRAVENOUS

## 2015-05-10 MED ORDER — REGADENOSON 0.4 MG/5ML IV SOLN
INTRAVENOUS | Status: AC
  Administered 2015-05-10: 0.4 mg via INTRAVENOUS
  Filled 2015-05-10: qty 5

## 2015-05-10 MED ORDER — REGADENOSON 0.4 MG/5ML IV SOLN
0.4000 mg | Freq: Once | INTRAVENOUS | Status: AC
  Administered 2015-05-10: 0.4 mg via INTRAVENOUS
  Filled 2015-05-10: qty 5

## 2015-05-10 MED ORDER — NITROGLYCERIN 0.4 MG SL SUBL: 0.4000 mg | SUBLINGUAL_TABLET | SUBLINGUAL | Status: DC | PRN

## 2015-05-10 MED ORDER — ASPIRIN 81 MG PO TBEC: 81.0000 mg | DELAYED_RELEASE_TABLET | Freq: Every day | ORAL | Status: DC

## 2015-05-10 MED ORDER — TECHNETIUM TC 99M SESTAMIBI GENERIC - CARDIOLITE
10.0000 | Freq: Once | INTRAVENOUS | Status: AC | PRN
  Administered 2015-05-10: 10 via INTRAVENOUS

## 2015-05-10 NOTE — Progress Notes (Signed)
7 mins, Dr. Sharyn LullHarwani at bedside, pt denies CP/SOB or abd discomfort. Testing has ended.

## 2015-05-10 NOTE — Progress Notes (Signed)
5 mins, Dr. Sharyn LullHarwani at bedside. Pt denies CP or abd discomfort. Pt states his SOB "has went away."

## 2015-05-10 NOTE — Progress Notes (Signed)
1 min, Dr. Sharyn Lullharwani at bedside, pt reports some SOB. Pt denies CP or abd discomfort

## 2015-05-10 NOTE — Progress Notes (Signed)
3 mins, Dr. Sharyn LullHarwani remains at bedside. Pt denies CP or abd discomfort. Pt also reports SOB has "leveled down."

## 2015-05-10 NOTE — Discharge Summary (Signed)
Discharge summary dictated on 05/10/2015, dictation number is [id number]

## 2015-05-10 NOTE — Progress Notes (Signed)
Patient given discharge instructions and all questions answered.  Patient stated that he goes to the [facility] in [place], [place].  Dr. Sharyn LullHarwani made aware.  Patient discharged with all belongings.

## 2015-05-10 NOTE — Progress Notes (Signed)
Pt has orders to be discharged. Discharge instructions given and pt has no additional questions at this time. Medication regimen reviewed and pt educated. Pt verbalized understanding and has no additional questions. Telemetry box removed. IV removed and site in good condition. Pt stable and waiting for transportation. 

## 2015-05-10 NOTE — Progress Notes (Signed)
Pre test vital signs

## 2015-05-10 NOTE — Discharge Instructions (Signed)
Angina Pectoris  Angina pectoris, often called angina, is extreme discomfort in the chest, neck, or arm. This is caused by a lack of blood in the middle and thickest layer of the heart wall (myocardium). There are four types of angina:  · Stable angina. Stable angina usually occurs in episodes of predictable frequency and duration. It is usually brought on by physical activity, stress, or excitement. Stable angina usually lasts a few minutes and can often be relieved by a medicine that you place under your tongue. This medicine is called sublingual nitroglycerin.  · Unstable angina. Unstable angina can occur even when you are doing little or no physical activity. It can even occur while you are sleeping or when you are at rest. It can suddenly increase in severity or frequency. It may not be relieved by sublingual nitroglycerin, and it can last up to 30 minutes.  · Microvascular angina. This type of angina is caused by a disorder of tiny blood vessels called arterioles. Microvascular angina is more common in women. The pain may be more severe and last longer than other types of angina pectoris.  · Prinzmetal or variant angina. This type of angina pectoris is rare and usually occurs when you are doing little or no physical activity. It especially occurs in the early morning hours.  CAUSES  Atherosclerosis is the cause of angina. This is the buildup of fat and cholesterol (plaque) on the inside of the arteries. Over time, the plaque may narrow or block the artery, and this will lessen blood flow to the heart. Plaque can also become weak and break off within a coronary artery to form a clot and cause a sudden blockage.  RISK FACTORS  Risk factors common to both men and women include:  · High cholesterol levels.  · High blood pressure (hypertension).  · Tobacco use.  · Diabetes.  · Family history of angina.  · Obesity.  · Lack of exercise.  · A diet high in saturated fats.  Women are at greater risk for angina if they  are:  · Over age 55.  · Postmenopausal.  SYMPTOMS  Many people do not experience any symptoms during the early stages of angina. As the condition progresses, symptoms common to both men and women may include:  · Chest pain.    The pain can be described as a crushing or squeezing in the chest, or a tightness, pressure, fullness, or heaviness in the chest.    The pain can last more than a few minutes, or it can stop and recur.  · Pain in the arms, neck, jaw, or back.  · Unexplained heartburn or indigestion.  · Shortness of breath.  · Nausea.  · Sudden cold sweats.  · Sudden light-headedness.  Many women have chest discomfort and some of the other symptoms. However, women often have different (atypical) symptoms, such as:   · Fatigue.  · Unexplained feelings of nervousness or anxiety.  · Unexplained weakness.  · Dizziness or fainting.  Sometimes, women may have angina without any symptoms.  DIAGNOSIS   Tests to diagnose angina may include:  · ECG (electrocardiogram).  · Exercise stress test. This looks for signs of blockage when the heart is being exercised.  · Pharmacologic stress test. This test looks for signs of blockage when the heart is being stressed with a medicine.  · Blood tests.  · Coronary angiogram. This is a procedure to look at the coronary arteries to see if there is any blockage.    TREATMENT   The treatment of angina may include the following:  · Healthy behavioral changes to reduce or control risk factors.  · Medicine.  · Coronary stenting. A stent helps to keep an artery open.  · Coronary angioplasty. This procedure widens a narrowed or blocked artery.  · Coronary artery bypass surgery. This will allow your blood to pass the blockage (bypass) to reach your heart.  HOME CARE INSTRUCTIONS   · Take medicines only as directed by your health care provider.  · Do not take the following medicines unless your health care provider approves:    Nonsteroidal anti-inflammatory drugs (NSAIDs), such as ibuprofen,  naproxen, or celecoxib.    Vitamin supplements that contain vitamin A, vitamin E, or both.    Hormone replacement therapy that contains estrogen with or without progestin.  · Manage other health conditions such as hypertension and diabetes as directed by your health care provider.  · Follow a heart-healthy diet. A dietitian can help to educate you about healthy food options and changes.  · Use healthy cooking methods such as roasting, grilling, broiling, baking, poaching, steaming, or stir-frying. Talk to a dietitian to learn more about healthy cooking methods.  · Follow an exercise program approved by your health care provider.  · Maintain a healthy weight. Lose weight as approved by your health care provider.  · Plan rest periods when fatigued.  · Learn to manage stress.  · Do not use any tobacco products, including cigarettes, chewing tobacco, or electronic cigarettes. If you need help quitting, ask your health care provider.  · If you drink alcohol, and your health care provider approves, limit your alcohol intake to no more than 1 drink per day. One drink equals 12 ounces of beer, 5 ounces of wine, or 1½ ounces of hard liquor.  · Stop illegal drug use.  · Keep all follow-up visits as directed by your health care provider. This is important.  SEEK IMMEDIATE MEDICAL CARE IF:   · You have pain in your chest, neck, arm, jaw, stomach, or back that lasts more than a few minutes, is recurring, or is unrelieved by taking sublingual nitroglycerin.  · You have profuse sweating without cause.  · You have unexplained:    Heartburn or indigestion.    Shortness of breath or difficulty breathing.    Nausea or vomiting.    Fatigue.    Feelings of nervousness or anxiety.    Weakness.    Diarrhea.  · You have sudden light-headedness or dizziness.  · You faint.  These symptoms may represent a serious problem that is an emergency. Do not wait to see if the symptoms will go away. Get medical help right away. Call your local  emergency services (911 in the [place] Do not drive yourself to the hospital.     This information is not intended to replace advice given to you by your health care provider. Make sure you discuss any questions you have with your health care provider.     Document Released: 01/19/2005 Document Revised: 02/09/2014 Document Reviewed: 05/23/2013  Elsevier Interactive Patient Education ©2016 [employer]

## 2015-05-11 NOTE — Discharge Summary (Signed)
NAME:  Austin Rhodes, Austin Rhodes            ACCOUNT NO.:  [id number]  MEDICAL RECORD NO.:  [id number]  LOCATION:  3E20C                        FACILITY:  [facility]  PHYSICIAN:  Jaylanni Eltringham N. Sharyn Lull, M.D. DATE OF BIRTH:  1950/09/01  DATE OF ADMISSION:  05/09/2015 DATE OF DISCHARGE:  05/10/2015                              DISCHARGE SUMMARY   APRIL 08, 2017ADMITTING DIAGNOSES: 1. Chest pain, worrisome for angina, rule out myocardial infarction. 2. Status post hypotension, probably secondary to meds. 3. History of mild CAD in remote past. 4. Hypertension. 5. History of cerebrovascular accident in the past. 6. History of vasovagal syncope in the past. 7. Gastroesophageal reflux disease. 8. Bipolar disorder. 9. Posttraumatic stress disorder. 10.Elevated blood sugar, rule out diabetes mellitus.  FINAL DIAGNOSES: 1. Stable angina, myocardial infarction ruled out, mildly positive     nuclear stress test. 2. Status post hypotension secondary to medication. 3. History of mild coronary artery disease in the remote past. 4. Hypertension. 5. History of cerebrovascular accident in the past. 6. History of vasovagal syncope in the past. 7. Gastroesophageal reflux disease. 8. Bipolar disorder. 9. History of posttraumatic stress disorder. 10.Prediabetes. 11.Thrombocytopenia, stable.  DISCHARGE HOME MEDICATIONS: 1. Aspirin 81 mg 1 tablet daily. 2. Clopidogrel 75 mg 1 tablet daily. 3. Nitrostat 0.4 mg sublingual use as directed. 4. B complete vitamin 1 tablet daily. 5. Depakote ER 500 mg twice daily. 6. Lamictal 200 mg daily. 7. Metoprolol succinate 25 mg a day daily. 8. Multivitamin with minerals 1 tablet daily. 9. Crestor 10 mg 1 tablet daily. 10.Seroquel XR 200 mg daily at bedtime. 11.Flomax 0.4 mg daily. The patient has been advised to stop amlodipine and losartan for now.  DIET:  Low-salt, low-cholesterol 1800 calories ADA diet.  The patient has been advised to avoid sweets and carbohydrates.   Monitor blood pressure daily.  FOLLOWUP:  Follow up with me in 1 week.  CONDITION AT DISCHARGE:  Stable.  BRIEF HISTORY AND HOSPITAL COURSE:  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  The patient denies any syncopal episode.  States chest pain, radiated to left shoulder associated with mild diaphoresis.  The patient was noted to be hypotensive with blood pressure in 70s by EMS, received IV fluid bolus with improvement in his blood pressure.  EKG done in the ED showed normal sinus rhythm with no acute ischemic changes.  First set of troponin I in the ED was normal.  The patient denies any weakness in the arms or legs.  Denies any slurred speech.  Denies any recent changes in medications.  He states lately he feels tired, fatigued, and no energy.  PHYSICAL EXAMINATION:  GENERAL:  He was alert, awake, oriented x3. VITAL SIGNS:  Blood pressure when seen in the ED was 108/63, pulse 79, he was  afebrile.  Conjunctiva was pink. NECK:  Supple.  No JVD.  No bruit. LUNGS:  Clear to auscultation without rhonchi or rales. CARDIOVASCULAR EXAM:  S1, S2 was normal.  There was no S3 gallop. ABDOMEN:  Soft.  Bowel sounds were present.  Nontender. EXTREMITIES:  There is no clubbing, cyanosis, or edema. NEUROLOGIC:  Grossly intact.  LABORATORY DATA:  Sodium was 143, potassium 4.8, BUN 16, creatinine 1.40.  His glucose was 126.  Repeat fasting blood sugar was 99 hemoglobin was 14.2, hematocrit 43.9, white  count of 9.7 chest x-ray showed no evidence of cardiopulmonary disease.  Repeat electrolytes, BUN was 15 creatinine 1.32, which is trending down, this morning BUN was 14, creatinine 1.10.  Fasting blood sugar was 99.  Three sets of troponin-I were negative.  His lactic acid level was normal 1.4, cholesterol was 93, triglycerides 75, HDL 39, LDL 39.  BRIEF HOSPITAL COURSE:  The patient was admitted to telemetry unit.  MI was ruled out by serial enzymes and EKG.  The patient did not have any episodes of chest pain during the hospital stay.  The patient subsequently underwent nuclear stress test, which showed equivocal finding involving the inferior wall and anterior wall and questionable reversibility in the basal segment of the anterior wall, which is also equivocal with normal wall motion and EF of 67%.  The patient is eager to go home.  The patient will be discharged home on above medications and will be followed up closely in my office in 1 week.  If he continues to have recurrent chest pain we will discuss with the patient regarding cardiac catheterization as outpatient.     Eduardo OsierMohan N. Sharyn LullHarwani, M.D.     [facility]  D:  05/10/2015  T:  05/11/2015  Job:  [id number]

## 2016-08-29 ENCOUNTER — Encounter: Payer: Self-pay | Admitting: Emergency Medicine

## 2016-08-29 ENCOUNTER — Emergency Department: Payer: Medicaid Other

## 2016-08-29 ENCOUNTER — Emergency Department
Admission: EM | Admit: 2016-08-29 | Discharge: 2016-08-31 | Payer: Medicaid Other | Attending: Emergency Medicine | Admitting: Emergency Medicine

## 2016-08-29 DIAGNOSIS — Z79899 Other long term (current) drug therapy: Secondary | ICD-10-CM | POA: Diagnosis not present

## 2016-08-29 DIAGNOSIS — F22 Delusional disorders: Secondary | ICD-10-CM | POA: Diagnosis not present

## 2016-08-29 DIAGNOSIS — I2510 Atherosclerotic heart disease of native coronary artery without angina pectoris: Secondary | ICD-10-CM | POA: Insufficient documentation

## 2016-08-29 DIAGNOSIS — Z7982 Long term (current) use of aspirin: Secondary | ICD-10-CM | POA: Insufficient documentation

## 2016-08-29 DIAGNOSIS — F29 Unspecified psychosis not due to a substance or known physiological condition: Secondary | ICD-10-CM

## 2016-08-29 LAB — URINE DRUG SCREEN, QUALITATIVE (ARMC ONLY)
AMPHETAMINES, UR SCREEN: NOT DETECTED
BENZODIAZEPINE, UR SCRN: NOT DETECTED
Barbiturates, Ur Screen: NOT DETECTED
Cannabinoid 50 Ng, Ur ~~LOC~~: NOT DETECTED
Cocaine Metabolite,Ur ~~LOC~~: NOT DETECTED
MDMA (ECSTASY) UR SCREEN: NOT DETECTED
METHADONE SCREEN, URINE: NOT DETECTED
OPIATE, UR SCREEN: NOT DETECTED
Phencyclidine (PCP) Ur S: NOT DETECTED
Tricyclic, Ur Screen: NOT DETECTED

## 2016-08-29 LAB — URINALYSIS, COMPLETE (UACMP) WITH MICROSCOPIC
BACTERIA UA: NONE SEEN
BILIRUBIN URINE: NEGATIVE
Glucose, UA: 50 mg/dL — AB
Hgb urine dipstick: NEGATIVE
Ketones, ur: 5 mg/dL — AB
LEUKOCYTES UA: NEGATIVE
Nitrite: NEGATIVE
Protein, ur: NEGATIVE mg/dL
SPECIFIC GRAVITY, URINE: 1.006 (ref 1.005–1.030)
SQUAMOUS EPITHELIAL / LPF: NONE SEEN
pH: 7 (ref 5.0–8.0)

## 2016-08-29 LAB — ACETAMINOPHEN LEVEL

## 2016-08-29 LAB — CARBAMAZEPINE LEVEL, TOTAL: Carbamazepine Lvl: <2 ug/mL — ABNORMAL LOW (ref 4.0–12.0)

## 2016-08-29 LAB — COMPREHENSIVE METABOLIC PANEL
ALBUMIN: 4.2 g/dL (ref 3.5–5.0)
ALT: 19 U/L (ref 17–63)
AST: 36 U/L (ref 15–41)
Alkaline Phosphatase: 49 U/L (ref 38–126)
Anion gap: 12 (ref 5–15)
BILIRUBIN TOTAL: 0.6 mg/dL (ref 0.3–1.2)
BUN: 14 mg/dL (ref 6–20)
CO2: 24 mmol/L (ref 22–32)
CREATININE: 1.28 mg/dL — AB (ref 0.61–1.24)
Calcium: 9.7 mg/dL (ref 8.9–10.3)
Chloride: 104 mmol/L (ref 101–111)
GFR calc Af Amer: >60 mL/min (ref >60)
GFR calc non Af Amer: 58 mL/min — ABNORMAL LOW (ref >60)
GLUCOSE: 120 mg/dL — AB (ref 65–99)
POTASSIUM: 3.9 mmol/L (ref 3.5–5.1)
SODIUM: 140 mmol/L (ref 135–145)
Total Protein: 6.9 g/dL (ref 6.5–8.1)

## 2016-08-29 LAB — CBC
HCT: 49.9 % (ref 40.0–52.0)
Hemoglobin: 16.9 g/dL (ref 13.0–18.0)
MCH: 30.3 pg (ref 26.0–34.0)
MCHC: 33.9 g/dL (ref 32.0–36.0)
MCV: 89.5 fL (ref 80.0–100.0)
PLATELETS: 189 10*3/uL (ref 150–440)
RBC: 5.58 MIL/uL (ref 4.40–5.90)
RDW: 13.6 % (ref 11.5–14.5)
WBC: 8.5 10*3/uL (ref 3.8–10.6)

## 2016-08-29 LAB — SALICYLATE LEVEL

## 2016-08-29 LAB — ETHANOL: Alcohol, Ethyl (B): <5 mg/dL (ref <5)

## 2016-08-29 LAB — VALPROIC ACID LEVEL: Valproic Acid Lvl: 65 ug/mL (ref 50.0–100.0)

## 2016-08-29 MED ORDER — QUETIAPINE FUMARATE ER 200 MG PO TB24
200.0000 mg | ORAL_TABLET | Freq: Every day | ORAL | Status: DC
  Administered 2016-08-29 – 2016-08-30 (×2): 200 mg via ORAL
  Filled 2016-08-29 (×4): qty 1

## 2016-08-29 MED ORDER — SODIUM CHLORIDE 0.9 % IV BOLUS (SEPSIS)
500.0000 mL | Freq: Once | INTRAVENOUS | Status: AC
  Administered 2016-08-29: 500 mL via INTRAVENOUS

## 2016-08-29 MED ORDER — METOPROLOL SUCCINATE ER 50 MG PO TB24
25.0000 mg | ORAL_TABLET | Freq: Every day | ORAL | Status: DC
  Administered 2016-08-29 – 2016-08-30 (×2): 25 mg via ORAL
  Filled 2016-08-29 (×2): qty 1

## 2016-08-29 MED ORDER — ASPIRIN EC 81 MG PO TBEC
81.0000 mg | DELAYED_RELEASE_TABLET | Freq: Every day | ORAL | Status: DC
  Administered 2016-08-29 – 2016-08-30 (×2): 81 mg via ORAL
  Filled 2016-08-29 (×2): qty 1

## 2016-08-29 MED ORDER — LORAZEPAM 2 MG/ML IJ SOLN
1.0000 mg | Freq: Once | INTRAMUSCULAR | Status: AC
  Administered 2016-08-29: 1 mg via INTRAVENOUS
  Filled 2016-08-29: qty 1

## 2016-08-29 MED ORDER — TAMSULOSIN HCL 0.4 MG PO CAPS
0.4000 mg | ORAL_CAPSULE | Freq: Every day | ORAL | Status: DC
Start: 2016-08-29 — End: 2016-08-31
  Administered 2016-08-29 – 2016-08-30 (×2): 0.4 mg via ORAL
  Filled 2016-08-29 (×2): qty 1

## 2016-08-29 MED ORDER — CLOPIDOGREL BISULFATE 75 MG PO TABS
75.0000 mg | ORAL_TABLET | Freq: Every day | ORAL | Status: DC
  Administered 2016-08-29 – 2016-08-30 (×2): 75 mg via ORAL
  Filled 2016-08-29 (×2): qty 1

## 2016-08-29 MED ORDER — DIVALPROEX SODIUM ER 250 MG PO TB24
500.0000 mg | ORAL_TABLET | Freq: Two times a day (BID) | ORAL | Status: DC
  Administered 2016-08-29 – 2016-08-30 (×4): 500 mg via ORAL
  Filled 2016-08-29 (×4): qty 2

## 2016-08-29 MED ORDER — LAMOTRIGINE 100 MG PO TABS
200.0000 mg | ORAL_TABLET | Freq: Every day | ORAL | Status: DC
  Administered 2016-08-29 – 2016-08-30 (×2): 200 mg via ORAL
  Filled 2016-08-29 (×2): qty 2

## 2016-08-29 NOTE — ED Notes (Addendum)
This tech heard movement in pts room and discovered the pt to be incredibly unsteady attempting to walk towards the head of his bed when I caught him just before he fell to the ground mid air. Pt st'd "My clothes are wet, Im looking for the bathroom"/ this EDT assisted pt back to bed and informed RN of the near miss. Pt was transferred to the restroom via wheelchair. Pt;s bed and clothing were dry. Pt was assisted to the toilet. Pt did not void or defecate. Pt was then transferred back to bed via wheelchair and was given a drink of water and 2 new blankets with a new linen change. A bed alarm has been placed on the pt. RN notified

## 2016-08-29 NOTE — ED Notes (Signed)
PT IVC/ PENDING SOC CONSULT  

## 2016-08-29 NOTE — ED Notes (Signed)
Pts dtr Austin FanningJulie called to check status of pt, informed her that [facility] and that we are still waiting on report to come back. Informed her pt has been calm since she left and we have not had any issues.

## 2016-08-29 NOTE — ED Notes (Signed)
Spoke with Dr. Mayford KnifeWilliams and informed him that pt too unsteady on his feet [facility]
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
## 2016-08-29 NOTE — ED Notes (Signed)
Spoke with pt dtr, Raynelle FanningJulie, and informed her that pt is now IVC, gave her a copy of visitation and phone call hours and password and discussed plan of care with her.  Raynelle FanningJulie request to be called with any updates

## 2016-08-29 NOTE — ED Notes (Signed)
Pt was given a sandwich tray and a beverage for a midnight snack. Pt does not need anything at time. Bed alarm still on pt, environment secure

## 2016-08-29 NOTE — BH Assessment (Signed)
Spoke with [facility] and they do not have any beds at this time for the patient.   
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
## 2016-08-29 NOTE — ED Notes (Signed)
TTS at bedside. 
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
## 2016-08-29 NOTE — ED Notes (Signed)
Pt dressed out by this RN and Allayne StackJann, ED Tech. Pt had 1 pair of shoes, 1 pair of shorts, 1 white t-shirt, 1 pair of gray boxers, 1 belt, 1 set of keys, 71 cents in change (2 quarters, 2 dimes, 1 penny). Belongings were placed in pt belongings bag and given to daughter, [name](Julie).
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
## 2016-08-30 MED ORDER — RISPERIDONE 1 MG PO TABS
0.5000 mg | ORAL_TABLET | Freq: Two times a day (BID) | ORAL | Status: DC
  Administered 2016-08-30 (×2): 0.5 mg via ORAL
  Filled 2016-08-30 (×2): qty 1

## 2016-08-30 NOTE — BH Assessment (Signed)
[facility].

## 2016-08-30 NOTE — ED Notes (Signed)
PT IVC PENDING PSYCH ADMISSION 

## 2016-08-30 NOTE — ED Notes (Signed)
PT IVC/ PENDING TRANSPORTATION TO [facility].

## 2016-08-30 NOTE — BH Assessment (Signed)
[facility] requesting updated vitals. Pt RN Biomedical scientist(Bill) informed.

## 2016-08-30 NOTE — BH Assessment (Signed)
Spoke with Gunnar FusiPaula from [facility] and the patient has been placed on the wait list.

## 2016-08-30 NOTE — BH Assessment (Signed)
Pt has been accepted to [facility]. Informed ED Secretary, Nurse Annette StableBill and EDP Mayford KnifeWilliams. [facility] Is Dr. Merilyn BabaUreh [place]. The phone number is [phone]. Delorise ShinerGrace was the representative.

## 2016-08-30 NOTE — ED Notes (Signed)
Daughter, Raynelle FanningJulie, called with update, msg passed to pt

## 2016-08-30 NOTE — ED Provider Notes (Signed)
-----------------------------------------   7:22 AM on 08/30/2016 -----------------------------------------   Blood pressure 124/84, pulse 72, temperature 98.7 F (37.1 C), temperature source Oral, resp. rate 16, height 5\' 10"  (1.778 m), weight 77.1 kg (170 lb), SpO2 97 %.  The patient had no acute events since last update.  Calm and cooperative at this time.  Disposition is pending Psychiatry/Behavioral Medicine team recommendations.     Jeanmarie PlantMcShane, Eiman Maret A, MD 08/30/16 [phone]

## 2016-08-30 NOTE — ED Notes (Signed)
Report to bill, rn.  

## 2016-08-30 NOTE — ED Notes (Signed)
Pt daughter came to visit pt
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
## 2016-08-30 NOTE — ED Notes (Addendum)
Please call [facility] [phone] when pt is transferred.  Please call pts daughter, Raynelle FanningJulie, [phone] at transfer

## 2016-08-30 NOTE — ED Notes (Signed)
Report from noel, rn.  

## 2016-08-30 NOTE — ED Notes (Signed)
Pt awake;pt sitting up in bed eating lunch tray; pt given lemon-lime soda

## 2016-08-30 NOTE — ED Notes (Signed)
Per pts daughter pt has history of bipolar and has been taking his meds. [facility]. [facility]

## 2016-08-30 NOTE — ED Notes (Signed)
pts daughter Raynelle Fanningjulie notified of pts pending transfer.

## 2016-08-30 NOTE — ED Notes (Signed)
Pt's bed adjusted; pt reports I feel much better, "I forgot to tell you I'm supposed to get married next week, I have to get out of here"
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
## 2016-08-30 NOTE — BH Assessment (Signed)
Referral information for Psychiatric Hospitalization faxed to;    [facility] [phone]),    [facility] [phone]),    [facility] [phone]),    [address] [phone]),    [facility] [phone])    [facility] [phone]),    [facility] [phone]),    [facility] [phone]),    [facility] [phone]).

## 2016-08-31 DIAGNOSIS — I2510 Atherosclerotic heart disease of native coronary artery without angina pectoris: Secondary | ICD-10-CM | POA: Diagnosis present

## 2016-08-31 DIAGNOSIS — K219 Gastro-esophageal reflux disease without esophagitis: Secondary | ICD-10-CM | POA: Insufficient documentation

## 2016-08-31 DIAGNOSIS — Z862 Personal history of diseases of the blood and blood-forming organs and certain disorders involving the immune mechanism: Secondary | ICD-10-CM | POA: Insufficient documentation

## 2016-08-31 DIAGNOSIS — N400 Enlarged prostate without lower urinary tract symptoms: Secondary | ICD-10-CM | POA: Diagnosis present

## 2016-08-31 DIAGNOSIS — Z8673 Personal history of transient ischemic attack (TIA), and cerebral infarction without residual deficits: Secondary | ICD-10-CM | POA: Insufficient documentation

## 2016-08-31 DIAGNOSIS — R7303 Prediabetes: Secondary | ICD-10-CM | POA: Insufficient documentation

## 2016-08-31 DIAGNOSIS — Z87898 Personal history of other specified conditions: Secondary | ICD-10-CM | POA: Insufficient documentation

## 2016-08-31 NOTE — ED Notes (Signed)
Austin Rhodes, daughter, has pt belongings, updated on disposition [facility]

## 2016-08-31 NOTE — ED Provider Notes (Signed)
The patient remained stable for transfer no change in his condition.   Merrily Brittleifenbark, Shantanique Hodo, MD 08/31/16 [phone]
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
## 2016-09-01 LAB — LAMOTRIGINE LEVEL: Lamotrigine Lvl: 18.4 ug/mL (ref 2.0–20.0)

## 2017-03-31 ENCOUNTER — Emergency Department: Payer: Medicaid Other

## 2017-03-31 ENCOUNTER — Emergency Department
Admission: EM | Admit: 2017-03-31 | Discharge: 2017-03-31 | Payer: Medicaid Other | Attending: Emergency Medicine | Admitting: Emergency Medicine

## 2017-03-31 ENCOUNTER — Other Ambulatory Visit: Payer: Self-pay

## 2017-03-31 ENCOUNTER — Ambulatory Visit (HOSPITAL_COMMUNITY)
Admission: AD | Admit: 2017-03-31 | Discharge: 2017-03-31 | Disposition: A | Discharge status: Home / Self Care | Payer: Medicaid Other | Source: Other Acute Inpatient Hospital | Attending: Emergency Medicine | Admitting: Emergency Medicine

## 2017-03-31 ENCOUNTER — Encounter: Payer: Self-pay | Admitting: Emergency Medicine

## 2017-03-31 DIAGNOSIS — S022XXA Fracture of nasal bones, initial encounter for closed fracture: Secondary | ICD-10-CM | POA: Insufficient documentation

## 2017-03-31 DIAGNOSIS — S065XAA Traumatic subdural hemorrhage with loss of consciousness status unknown, initial encounter: Secondary | ICD-10-CM

## 2017-03-31 DIAGNOSIS — I2510 Atherosclerotic heart disease of native coronary artery without angina pectoris: Secondary | ICD-10-CM | POA: Diagnosis not present

## 2017-03-31 DIAGNOSIS — I6200 Nontraumatic subdural hemorrhage, unspecified: Secondary | ICD-10-CM | POA: Diagnosis not present

## 2017-03-31 DIAGNOSIS — S065X9A Traumatic subdural hemorrhage with loss of consciousness of unspecified duration, initial encounter: Secondary | ICD-10-CM

## 2017-03-31 DIAGNOSIS — W08XXXA Fall from other furniture, initial encounter: Secondary | ICD-10-CM | POA: Diagnosis not present

## 2017-03-31 DIAGNOSIS — Y999 Unspecified external cause status: Secondary | ICD-10-CM | POA: Diagnosis not present

## 2017-03-31 DIAGNOSIS — Y939 Activity, unspecified: Secondary | ICD-10-CM | POA: Insufficient documentation

## 2017-03-31 DIAGNOSIS — Y929 Unspecified place or not applicable: Secondary | ICD-10-CM | POA: Insufficient documentation

## 2017-03-31 DIAGNOSIS — S0990XA Unspecified injury of head, initial encounter: Secondary | ICD-10-CM | POA: Diagnosis present

## 2017-03-31 LAB — VALPROIC ACID LEVEL: VALPROIC ACID LVL: 16 ug/mL — AB (ref 50.0–100.0)

## 2017-03-31 LAB — CBC WITH DIFFERENTIAL/PLATELET
Basophils Absolute: 0.1 10*3/uL (ref 0–0.1)
Basophils Relative: 0 %
EOS ABS: 0 10*3/uL (ref 0–0.7)
Eosinophils Relative: 0 %
HEMATOCRIT: 48.9 % (ref 40.0–52.0)
HEMOGLOBIN: 16.4 g/dL (ref 13.0–18.0)
LYMPHS ABS: 1.1 10*3/uL (ref 1.0–3.6)
LYMPHS PCT: 7 %
MCH: 30.5 pg (ref 26.0–34.0)
MCHC: 33.5 g/dL (ref 32.0–36.0)
MCV: 91.1 fL (ref 80.0–100.0)
MONOS PCT: 5 %
Monocytes Absolute: 0.9 10*3/uL (ref 0.2–1.0)
NEUTROS ABS: 14.2 10*3/uL — AB (ref 1.4–6.5)
NEUTROS PCT: 88 %
Platelets: 215 10*3/uL (ref 150–440)
RBC: 5.37 MIL/uL (ref 4.40–5.90)
RDW: 13.9 % (ref 11.5–14.5)
WBC: 16.2 10*3/uL — AB (ref 3.8–10.6)

## 2017-03-31 LAB — ETHANOL

## 2017-03-31 LAB — COMPREHENSIVE METABOLIC PANEL
ALK PHOS: 59 U/L (ref 38–126)
ALT: 21 U/L (ref 17–63)
ANION GAP: 11 (ref 5–15)
AST: 36 U/L (ref 15–41)
Albumin: 4.2 g/dL (ref 3.5–5.0)
BILIRUBIN TOTAL: 0.8 mg/dL (ref 0.3–1.2)
BUN: 13 mg/dL (ref 6–20)
CALCIUM: 9 mg/dL (ref 8.9–10.3)
CO2: 22 mmol/L (ref 22–32)
CREATININE: 1.12 mg/dL (ref 0.61–1.24)
Chloride: 103 mmol/L (ref 101–111)
Glucose, Bld: 195 mg/dL — ABNORMAL HIGH (ref 65–99)
Potassium: 4.1 mmol/L (ref 3.5–5.1)
Sodium: 136 mmol/L (ref 135–145)
TOTAL PROTEIN: 6.8 g/dL (ref 6.5–8.1)

## 2017-03-31 LAB — TROPONIN I: Troponin I: <0.03 ng/mL (ref <0.03)

## 2017-03-31 MED ORDER — SODIUM CHLORIDE 0.9 % IV SOLN
Freq: Once | INTRAVENOUS | Status: AC
  Administered 2017-03-31: 18:00:00 via INTRAVENOUS

## 2017-03-31 MED ORDER — MORPHINE SULFATE (PF) 2 MG/ML IV SOLN
2.0000 mg | Freq: Once | INTRAVENOUS | Status: AC
  Administered 2017-03-31: 2 mg via INTRAVENOUS
  Filled 2017-03-31: qty 1

## 2017-03-31 MED ORDER — OXYCODONE-ACETAMINOPHEN 5-325 MG PO TABS: 2.0000 | ORAL_TABLET | Freq: Once | ORAL | Status: DC

## 2017-03-31 MED ORDER — MORPHINE SULFATE (PF) 2 MG/ML IV SOLN
2.0000 mg | Freq: Once | INTRAVENOUS | Status: AC
  Administered 2017-03-31: 2 mg via INTRAVENOUS

## 2017-03-31 MED ORDER — MORPHINE SULFATE (PF) 2 MG/ML IV SOLN
INTRAVENOUS | Status: AC
  Filled 2017-03-31: qty 1

## 2017-03-31 MED ORDER — LORAZEPAM 2 MG/ML IJ SOLN
0.5000 mg | Freq: Once | INTRAMUSCULAR | Status: AC
  Administered 2017-03-31: 0.5 mg via INTRAVENOUS
  Filled 2017-03-31: qty 1

## 2017-03-31 NOTE — ED Notes (Signed)
Daughter at bedside and updated on pt's plan of care

## 2017-03-31 NOTE — ED Notes (Addendum)
[facility]. Pt requesting pain medication. MD to be notified

## 2017-03-31 NOTE — ED Notes (Signed)
Patient transported to X-ray 

## 2017-03-31 NOTE — ED Notes (Signed)
[facility]  ALL  PAPERWORK AND  XRAY COPY SENT  WITH [facility] STAFF

## 2017-03-31 NOTE — ED Notes (Signed)
Family updated that still no bed assignment. Pt requesting food and something to drink

## 2017-03-31 NOTE — ED Notes (Signed)
Report given to [facility]. 

## 2017-03-31 NOTE — ED Notes (Signed)
Pt and family updated on plan of care  

## 2017-03-31 NOTE — ED Notes (Signed)
Family at bedside. 

## 2017-03-31 NOTE — ED Notes (Signed)
Pt spoke with daughter on phone.

## 2017-03-31 NOTE — ED Provider Notes (Addendum)
[facility] Emergency Department Provider Note       Time seen: ----------------------------------------- 1:26 PM on 03/31/2017 -----------------------------------------   I have reviewed the triage vital signs and the nursing notes.  HISTORY   Chief Complaint No chief complaint on file.    HPI Austin Rhodes is a 67 y.o. male with a history of polar disorder, coronary artery disease, PTSD and GERD who presents to the ED for a fall.  Patient states he fell off his couch and struck his face and head on the concrete floor.  Patient thinks he broke his nose, was noted to have a nosebleed prior to arrival.  He also vomited in route by EMS.  EMS states his story was somewhat unreliable as to why he fell.  He denies any recent illness or other complaints.  Patient reports he is taking his medications as prescribed  Past Medical History:  Diagnosis Date  . Bipolar disorder (HCC)   . Coronary artery disease   . GERD (gastroesophageal reflux disease)   . PTSD (post-traumatic stress disorder)     Patient Active Problem List   Diagnosis Date Noted  . Chest pain 05/09/2015    Past Surgical History:  Procedure Laterality Date  . CARDIAC CATHETERIZATION      Allergies Patient has no known allergies.  Social History Social History   Tobacco Use  . Smoking status: Never Smoker  . Smokeless tobacco: Never Used  Substance Use Topics  . Alcohol use: No  . Drug use: Not on file    Review of Systems Constitutional: Negative for fever. Eyes: Negative for vision changes ENT: Positive for nasal swelling and nosebleed Cardiovascular: Negative for chest pain. Respiratory: Negative for shortness of breath. Gastrointestinal: Negative for abdominal pain, positive for vomiting Musculoskeletal: Negative for back pain. Skin: Positive for facial swelling and contusions Neurological: Negative for headaches, positive for weakness  All systems  negative/normal/unremarkable except as stated in the HPI  ____________________________________________   PHYSICAL EXAM:  VITAL SIGNS: ED Triage Vitals  Enc Vitals Group     BP      Pulse      Resp      Temp      Temp src      SpO2      Weight      Height      Head Circumference      Peak Flow      Pain Score      Pain Loc      Pain Edu?      Excl. in GC?     Constitutional: Alert but confused.  No obvious distress, patient brought in C-spine immobilized Eyes: Conjunctivae are normal. Normal extraocular movements. ENT   Head: Normocephalic, with obvious anterior facial swelling and lip swelling   Nose: Dried blood is appreciated in the left naris   Mouth/Throat: Blood is present in his mouth with lip swelling   Neck: No stridor. Cardiovascular: Normal rate, regular rhythm. No murmurs, rubs, or gallops. Respiratory: Normal respiratory effort without tachypnea nor retractions. Breath sounds are clear and equal bilaterally. No wheezes/rales/rhonchi. Gastrointestinal: Soft and nontender. Normal bowel sounds Musculoskeletal: No lower extremity tenderness nor edema, pain with range of motion of the right knee and right wrist Neurologic:  Normal speech and language. No gross focal neurologic deficits are appreciated.  Confusion but seems to follow commands with repeated questioning Skin:  Skin is warm, dry and intact. No rash noted. Psychiatric: Flat affect is noted ____________________________________________  ED COURSE:  As part of my medical decision making, I reviewed the following data within the electronic MEDICAL RECORD NUMBER History obtained from family if available, nursing notes, old chart and ekg, as well as notes from prior ED visits. Patient presented for a fall with head and face injury, we will assess with labs and imaging as indicated at this time.   Procedures ____________________________________________   LABS (pertinent positives/negatives)  Labs  Reviewed  CBC WITH DIFFERENTIAL/PLATELET - Abnormal; Notable for the following components:      Result Value   WBC 16.2 (*)    Neutro Abs 14.2 (*)    All other components within normal limits  COMPREHENSIVE METABOLIC PANEL - Abnormal; Notable for the following components:   Glucose, Bld 195 (*)    All other components within normal limits  VALPROIC ACID LEVEL - Abnormal; Notable for the following components:   Valproic Acid Lvl 16 (*)    All other components within normal limits  TROPONIN I  ETHANOL  URINE DRUG SCREEN, QUALITATIVE ([facility] ONLY)  URINALYSIS, COMPLETE (UACMP) WITH MICROSCOPIC   EKG: Interpreted by me, normal sinus rhythm with normal axis, normal intervals, no evidence of hypertrophy or acute infarction  RADIOLOGY Images were viewed by me  CT head, C-spine, maxillofacial, knee x-ray, right wrist x-ray, chest x-ray Reveals subdural hematoma, nasal fracture IMPRESSION: CT head:  1. Falcine subdural hematoma with maximum thickness of 1.5 cm. Left frontal and temporal subdural hematoma with maximum thickness of 5 mm causing slight mass effect on localized structures but no midline shift. Note, however, that there is effacement of sulci throughout the left supratentorial region, consistent with early edema throughout this region. There is a left tentorial subdural hematoma with a thickness of 2 mm, not causing mass effect. No intra-axial fluid seen. 2. Moderate supratentorial small vessel disease. Small vessel disease noted throughout the right anterior limb internal capsule. 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 No acute infarct. 3. Apparent cerumen in each external auditory canal. 4. Foci of arterial vascular disease. CT maxillofacial:  1. Small avulsion arising from the anterior left nasal bone. No other fracture. No dislocation. 2. Mild paranasal sinus  disease. 3. Soft tissue edema mid face. No well-defined hematoma. 4. No intraorbital lesions. CT cervical spine:  1. No fracture or spondylolisthesis. 2. Multifocal arthropathy. 3. Carotid artery calcification bilaterally. Critical Value/emergent results were called by telephone at the time of interpretation on 03/31/2017 at 2:13 pm to Dr. Daryel NovemberJONATHAN Emmelina Mcloughlin , who verbally acknowledged these results. ____________________________________________  CRITICAL CARE Performed by: Ulice DashJohnathan E Aasiyah Auerbach   Total critical care time: 30 minutes  Critical care time was exclusive of separately billable procedures and treating other patients.  Critical care was necessary to treat or prevent imminent or life-threatening deterioration.  Critical care was time spent personally by me on the following activities: development of treatment plan with patient and/or surrogate as well as nursing, discussions with consultants, evaluation of patient's response to treatment, examination of patient, obtaining history from patient or surrogate, ordering and performing treatments and interventions, ordering and review of laboratory studies, ordering and review of radiographic studies, pulse oximetry and re-evaluation of patient's condition.   DIFFERENTIAL DIAGNOSIS   Head injury, ICH,  nasal fracture, C-spine fracture, concussion, medication noncompliance, medication side effect, dehydration, electrolyte abnormality, arrhythmia, MI  FINAL ASSESSMENT AND PLAN  Fall, head injury, nasal fracture, subdural hematomas   Plan: Patient had presented for  a fall that occurred at home. Patient's labs did not reveal any acute process other than leukocytosis. Patient's imaging was positive for multiple subdural hematomas.  Patient is somewhat confused at this time but otherwise appears to be awake and alert and following commands.  He will need to be transferred to a neuro ICU   Ulice Dash, MD   Note:  This note was generated in part or whole with voice recognition software. Voice recognition is usually quite accurate but there are transcription errors that can and very often do occur. I apologize for any typographical errors that were not detected and corrected.     Emily Filbert, MD 03/31/17 1414    Emily Filbert, MD 03/31/17 [phone]

## 2017-03-31 NOTE — ED Triage Notes (Signed)
Legal guardian is pt's daughter Cheryl FlashJulia Piscitelli [phone]) attempted to call , left a message for her to call ED.  Pt arrived by EMS after a fall with AMS c-spine precautions with c-collar and head blocks.  Bruising to left arm , swollen lower lip, pt complaining of neck pain.

## 2017-03-31 NOTE — ED Notes (Signed)
Secretary Misty StanleyLisa will attempt to call [facility] again to check on bed for pt. Family notified

## 2017-03-31 NOTE — ED Notes (Signed)
EMTAL[facility] and Medical Necessity documentation reviewed at this time and noted to be complete per policy. 

## 2017-10-29 ENCOUNTER — Encounter: Payer: Self-pay | Admitting: Emergency Medicine

## 2017-10-29 ENCOUNTER — Other Ambulatory Visit: Payer: Self-pay

## 2017-10-29 ENCOUNTER — Emergency Department
Admission: EM | Admit: 2017-10-29 | Discharge: 2017-10-31 | Disposition: A | Discharge status: Other Acute Inpatient Hospital | Payer: Medicare Other | Attending: Emergency Medicine | Admitting: Emergency Medicine

## 2017-10-29 DIAGNOSIS — Z7902 Long term (current) use of antithrombotics/antiplatelets: Secondary | ICD-10-CM | POA: Insufficient documentation

## 2017-10-29 DIAGNOSIS — Z79899 Other long term (current) drug therapy: Secondary | ICD-10-CM | POA: Insufficient documentation

## 2017-10-29 DIAGNOSIS — F3112 Bipolar disorder, current episode manic without psychotic features, moderate: Secondary | ICD-10-CM

## 2017-10-29 DIAGNOSIS — I2510 Atherosclerotic heart disease of native coronary artery without angina pectoris: Secondary | ICD-10-CM | POA: Insufficient documentation

## 2017-10-29 DIAGNOSIS — F209 Schizophrenia, unspecified: Secondary | ICD-10-CM | POA: Insufficient documentation

## 2017-10-29 HISTORY — DX: Personal history of transient ischemic attack (TIA), and cerebral infarction without residual deficits: Z86.73

## 2017-10-29 LAB — SALICYLATE LEVEL

## 2017-10-29 LAB — CBC
HCT: 48.6 % (ref 40.0–52.0)
Hemoglobin: 16.8 g/dL (ref 13.0–18.0)
MCH: 31.3 pg (ref 26.0–34.0)
MCHC: 34.5 g/dL (ref 32.0–36.0)
MCV: 90.6 fL (ref 80.0–100.0)
PLATELETS: 197 10*3/uL (ref 150–440)
RBC: 5.36 MIL/uL (ref 4.40–5.90)
RDW: 14.1 % (ref 11.5–14.5)
WBC: 9.5 10*3/uL (ref 3.8–10.6)

## 2017-10-29 LAB — COMPREHENSIVE METABOLIC PANEL
ALK PHOS: 56 U/L (ref 38–126)
ALT: 16 U/L (ref 0–44)
ANION GAP: 11 (ref 5–15)
AST: 22 U/L (ref 15–41)
Albumin: 4.4 g/dL (ref 3.5–5.0)
BUN: 12 mg/dL (ref 8–23)
CALCIUM: 9.5 mg/dL (ref 8.9–10.3)
CO2: 23 mmol/L (ref 22–32)
Chloride: 106 mmol/L (ref 98–111)
Creatinine, Ser: 1.12 mg/dL (ref 0.61–1.24)
GFR calc Af Amer: >60 mL/min (ref >60)
Glucose, Bld: 148 mg/dL — ABNORMAL HIGH (ref 70–99)
Potassium: 3.3 mmol/L — ABNORMAL LOW (ref 3.5–5.1)
SODIUM: 140 mmol/L (ref 135–145)
Total Bilirubin: 0.8 mg/dL (ref 0.3–1.2)
Total Protein: 6.8 g/dL (ref 6.5–8.1)

## 2017-10-29 LAB — URINE DRUG SCREEN, QUALITATIVE (ARMC ONLY)
AMPHETAMINES, UR SCREEN: NOT DETECTED
Barbiturates, Ur Screen: NOT DETECTED
Benzodiazepine, Ur Scrn: NOT DETECTED
Cannabinoid 50 Ng, Ur ~~LOC~~: NOT DETECTED
Cocaine Metabolite,Ur ~~LOC~~: NOT DETECTED
MDMA (ECSTASY) UR SCREEN: NOT DETECTED
Methadone Scn, Ur: NOT DETECTED
Opiate, Ur Screen: NOT DETECTED
PHENCYCLIDINE (PCP) UR S: NOT DETECTED
TRICYCLIC, UR SCREEN: NOT DETECTED

## 2017-10-29 LAB — ACETAMINOPHEN LEVEL: Acetaminophen (Tylenol), Serum: <10 ug/mL — ABNORMAL LOW (ref 10–30)

## 2017-10-29 LAB — ETHANOL

## 2017-10-29 NOTE — ED Notes (Signed)
[employer] got in touch with pt's son Nehemyah Foushee who is POA [phone] pt's son reports pt has bipolar disorder and Schizophrenia. Reported when pt does not take his medication pt has episodes like this, per officer

## 2017-10-29 NOTE — ED Provider Notes (Signed)
[facility] Emergency Department Provider Note   ____________________________________________   First MD Initiated Contact with Patient 10/29/17 2307     (approximate)  I have reviewed the triage vital signs and the nursing notes.   HISTORY  Chief Complaint Psychiatric Evaluation  Level V caveat: Limited by psychosis  HPI Austin Rhodes is a 67 y.o. male brought to the ED under IVC by [facility] police for psychosis.  Patient has a history of bipolar disorder as well as schizophrenia.  According to his son, who is his legal guardian, patient has been bizarre behavior when he is off his psychiatric medications.  Patient tells me he some fantastic all stories; for example, tells me he has fathered children with Hollywood stars such as Benay Spice.  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  Denies active SI/HI/AH/VH.  Voices no medical complaints.   Past Medical History:  Diagnosis Date  . Bipolar disorder (HCC)   . Coronary artery disease   . GERD (gastroesophageal reflux disease)   . History of multiple strokes   . PTSD (post-traumatic stress disorder)     Patient Active Problem List   Diagnosis Date Noted  . Chest pain 05/09/2015    Past Surgical History:  Procedure Laterality Date  . CARDIAC CATHETERIZATION      Prior to Admission medications   Medication Sig Start Date End Date Taking? Authorizing Provider  B Complex-Biotin-FA (B COMPLETE PO) Take 1 tablet by mouth daily.    [provider]  clopidogrel (PLAVIX) 75 MG tablet Take 1 tablet (75 mg total) by mouth daily. Patient not taking: Reported on 10/29/2017 05/10/15   Rinaldo Cloud, MD  divalproex (DEPAKOTE ER) 500 MG 24 hr tablet Take 500 mg by mouth 2 (two) times daily. 05/07/15   [provider]  lamoTRIgine (LAMICTAL) 200 MG tablet Take 200 mg by mouth daily. 04/08/15    [provider]  metoprolol succinate (TOPROL-XL) 25 MG 24 hr tablet Take 25 mg by mouth daily.    [provider]  Multiple Vitamin (MULTIVITAMIN WITH MINERALS) TABS tablet Take 1 tablet by mouth daily.    [provider]  rosuvastatin (CRESTOR) 10 MG tablet Take 10 mg by mouth daily.    [provider]  SEROQUEL XR 200 MG 24 hr tablet Take 200 mg by mouth at bedtime.  03/29/15   [provider]    Allergies Patient has no known allergies.  No family history on file.  Social History Social History   Tobacco Use  . Smoking status: Never Smoker  . Smokeless tobacco: Never Used  Substance Use Topics  . Alcohol use: No  . Drug use: Not on file    Review of Systems  Constitutional: No fever/chills Eyes: No visual changes. ENT: No sore throat. Cardiovascular: Denies chest pain. Respiratory: Denies shortness of breath. Gastrointestinal: No abdominal pain.  No nausea, no vomiting.  No diarrhea.  No constipation. Genitourinary: Negative for dysuria. Musculoskeletal: Negative for back pain. Skin: Negative for rash. Neurological: Negative for headaches, focal weakness or numbness. Psychiatric:Positive for psychosis.  ____________________________________________   PHYSICAL EXAM:  VITAL SIGNS: ED Triage Vitals [10/29/17 2209]  Enc Vitals Group     BP (!) 166/98     Pulse Rate (!) 130     Resp (!) 24     Temp 98.5 F (36.9 C)     Temp Source  Oral     SpO2 96 %     Weight 178 lb (80.7 kg)     Height 5\' 10"  (1.778 m)     Head Circumference      Peak Flow      Pain Score 0     Pain Loc      Pain Edu?      Excl. in GC?     Constitutional: Alert and oriented.  Disheveled appearing and in no acute distress.  Animated. Eyes: Conjunctivae are normal. PERRL. EOMI. Head: Atraumatic. Nose: No congestion/rhinnorhea. Mouth/Throat: Mucous membranes are moist.  Oropharynx non-erythematous. Neck: No stridor.   Cardiovascular: Normal  rate, regular rhythm. Grossly normal heart sounds.  Good peripheral circulation. Respiratory: Normal respiratory effort.  No retractions. Lungs CTAB. Gastrointestinal: Soft and nontender. No distention. No abdominal bruits. No CVA tenderness. Musculoskeletal: No lower extremity tenderness nor edema.  No joint effusions. Neurologic:  Normal speech and language. No gross focal neurologic deficits are appreciated. No gait instability. Skin:  Skin is warm, dry and intact. No rash noted. Psychiatric: Mood and affect are animated.  Pressured speech.  Bizarre behavior. ____________________________________________   LABS (all labs ordered are listed, but only abnormal results are displayed)  Labs Reviewed  COMPREHENSIVE METABOLIC PANEL - Abnormal; Notable for the following components:      Result Value   Potassium 3.3 (*)    Glucose, Bld 148 (*)    All other components within normal limits  ACETAMINOPHEN LEVEL - Abnormal; Notable for the following components:   Acetaminophen (Tylenol), Serum <10 (*)    All other components within normal limits  ETHANOL  CBC  URINE DRUG SCREEN, QUALITATIVE ([facility] ONLY)  SALICYLATE LEVEL   ____________________________________________  EKG  None ____________________________________________  RADIOLOGY  ED MD interpretation: None  Official radiology report(s): No results found.  ____________________________________________   PROCEDURES  Procedure(s) performed: None  Procedures  Critical Care performed: No  ____________________________________________   INITIAL IMPRESSION / ASSESSMENT AND PLAN / ED COURSE  As part of my medical decision making, I reviewed the following data within the electronic MEDICAL RECORD NUMBER History obtained from family, Nursing notes reviewed and incorporated, Labs reviewed, Old chart reviewed, A consult was requested and obtained from this/these consultant(s) Psychiatry and Notes from prior ED visits   67 year old  male with bipolar disorder as well as schizophrenia who presents to the ED under IVC for psychosis.  Laboratory results unremarkable.  Tachycardia noted; will have patient orally hydrate.  Will maintain IVC pending psychiatric evaluation and disposition.  Clinical Course as of Oct 31 703  Sat Oct 30, 2017  0704 Patient still awaiting [facility] psychiatry consultation.  He will remain in the ED under IVC pending psychiatric evaluation and disposition.   [JS]    Clinical Course User Index [JS] Irean Hong, MD     ____________________________________________   FINAL CLINICAL IMPRESSION(S) / ED DIAGNOSES  Final diagnoses:  Schizophrenia, unspecified type (HCC)  Bipolar affective disorder, currently manic, moderate [facility])     ED Discharge Orders    None       Note:  This document was prepared using Dragon voice recognition software and may include unintentional dictation errors.    Irean Hong, MD 10/30/17 [phone]

## 2017-10-29 NOTE — ED Notes (Signed)
Pt. Requested and was given 2nd meal tray and drink.  Pt. Is calm and cooperative at this time.

## 2017-10-29 NOTE — ED Triage Notes (Signed)
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 Pt upon RN entrance to triage room has pressure speech RN had to redirect several times Denies any SI, HI, denies any visual or auditory hallucinations

## 2017-10-30 MED ORDER — ROSUVASTATIN CALCIUM 10 MG PO TABS
10.0000 mg | ORAL_TABLET | Freq: Every day | ORAL | Status: DC
  Administered 2017-10-30 – 2017-10-31 (×2): 10 mg via ORAL
  Filled 2017-10-30 (×3): qty 1

## 2017-10-30 MED ORDER — HYDROXYZINE HCL 25 MG PO TABS
50.0000 mg | ORAL_TABLET | Freq: Once | ORAL | Status: AC
  Administered 2017-10-30: 50 mg via ORAL
  Filled 2017-10-30: qty 2

## 2017-10-30 MED ORDER — QUETIAPINE FUMARATE 200 MG PO TABS
200.0000 mg | ORAL_TABLET | Freq: Every day | ORAL | Status: DC
  Administered 2017-10-30 – 2017-10-31 (×2): 200 mg via ORAL
  Filled 2017-10-30 (×2): qty 1

## 2017-10-30 MED ORDER — LAMOTRIGINE 100 MG PO TABS
200.0000 mg | ORAL_TABLET | Freq: Every day | ORAL | Status: DC
  Administered 2017-10-30 – 2017-10-31 (×2): 200 mg via ORAL
  Filled 2017-10-30 (×2): qty 2

## 2017-10-30 MED ORDER — DIVALPROEX SODIUM 500 MG PO DR TAB
500.0000 mg | DELAYED_RELEASE_TABLET | Freq: Two times a day (BID) | ORAL | Status: DC
  Administered 2017-10-30 – 2017-10-31 (×4): 500 mg via ORAL
  Filled 2017-10-30 (×4): qty 1

## 2017-10-30 MED ORDER — METOPROLOL SUCCINATE ER 50 MG PO TB24
25.0000 mg | ORAL_TABLET | Freq: Every day | ORAL | Status: DC
  Administered 2017-10-30 – 2017-10-31 (×2): 25 mg via ORAL
  Filled 2017-10-30 (×2): qty 1

## 2017-10-30 NOTE — ED Notes (Signed)
Daughter of patient(Julie [phone] came to ED to see patient and pick up some of the patients things.  This nurse allowed daughter to talk to her father and ensure her father that his dog and car were with her and she would take care of it.  Patient was reassured and transported to the [facility].

## 2017-10-30 NOTE — ED Notes (Signed)
Patient received PM snack. 

## 2017-10-30 NOTE — BH Assessment (Addendum)
Patient is to be admitted to [facility] by Dr. Joseph Art. Attending Physician will be Dr.Pucilowska.   Patient has been assigned to room 313, by [facility] Charge Nurse Lorenda Hatchet.   Intake Paper Work has been signed and placed on patient chart.  ER staff is aware of the admission:  French Ana: ER Secretary    Dr. Cyril Loosen: ER MD   Amy: Patient's Nurse   Dan Humphreys: Patient Access.

## 2017-10-30 NOTE — ED Notes (Signed)

## 2017-10-30 NOTE — BH Assessment (Signed)
Assessment Note  Austin Rhodes is an 67 y.o. male who presents to the ED with a altered mental status. Per pt's chart he has a hx of Bipolar disorder, schizophrenia, and PTSD. Pt rambling on about various historical facts. Pt reports "I'm the whistle blower. And you should go home now because everyone who talked to me will get caught up in this mess. You all shouldn't have brought me here I'm going to have this entire hospital shut down". Pt reports several sexual relationships with several different celebrity women that all resulted in having a child.   Pt denies SI/HI A/V H/D. "No I shouldn't be here I'm fine. I'm just here because the FBI have me in a protected program and you all are supposed to be hidging e for safety"            Diagnosis: Bipo;ar Disorder.   Past Medical History:  Past Medical History:  Diagnosis Date  . Bipolar disorder (HCC)   . Coronary artery disease   . GERD (gastroesophageal reflux disease)   . History of multiple strokes   . PTSD (post-traumatic stress disorder)     Past Surgical History:  Procedure Laterality Date  . CARDIAC CATHETERIZATION      Family History: No family history on file.  Social History:  reports that he has never smoked. He has never used smokeless tobacco. He reports that he does not drink alcohol. His drug history is not on file.  Additional Social History:  Alcohol / Drug Use Pain Medications: SEE MAR Prescriptions: SEE MAR Over the Counter: SEE MAR History of alcohol / drug use?: No history of alcohol / drug abuse  CIWA: CIWA-Ar BP: (!) 166/98 Pulse Rate: (!) 130 COWS:    Allergies: No Known Allergies  Home Medications:  (Not in a hospital admission)  OB/GYN Status:  No LMP for male patient.  General Assessment Data Assessment unable to be completed: Yes Reason for not completing assessment: Altered mental status Location of Assessment: [facility] ED TTS Assessment: In system Is this a Tele or Face-to-Face  Assessment?: Face-to-Face Is this an Initial Assessment or a Re-assessment for this encounter?: Initial Assessment Patient Accompanied by:: N/A Language Other than English: No Living Arrangements: (Alone) What gender do you identify as?: Male Marital status: Single Living Arrangements: Alone Can pt return to current living arrangement?: Yes Admission Status: Involuntary Petitioner: Family member Is patient capable of signing voluntary admission?: No Referral Source: Self/Family/Friend Insurance type: Medicaid     Crisis Care Plan Living Arrangements: Alone Legal Guardian: Other relativeAmil Amen Reade [phone])) Name of Psychiatrist: [facility] Name of Therapist: [facility]  Education Status Is patient currently in school?: No Is the patient employed, unemployed or receiving disability?: Receiving disability income  Risk to self with the past 6 months Suicidal Ideation: No Has patient been a risk to self within the past 6 months prior to admission? : No Suicidal Intent: No Has patient had any suicidal intent within the past 6 months prior to admission? : No Is patient at risk for suicide?: No Suicidal Plan?: No Has patient had any suicidal plan within the past 6 months prior to admission? : No Access to Means: No What has been your use of drugs/alcohol within the last 12 months?: denies use Previous Attempts/Gestures: No How many times?: 0 Other Self Harm Risks: n/a Triggers for Past Attempts: None known Intentional Self Injurious Behavior: None Family Suicide History: No Recent stressful life event(s): Other (Comment) Persecutory voices/beliefs?: Yes Depression:  No Substance abuse history and/or treatment for substance abuse?: No Suicide prevention information given to non-admitted patients: Not applicable  Risk to Others within the past 6 months Homicidal Ideation: No Does patient have any lifetime risk of violence toward others beyond the six  months prior to admission? : No Thoughts of Harm to Others: No Current Homicidal Intent: No Current Homicidal Plan: No Access to Homicidal Means: No Identified Victim: n/a History of harm to others?: No Assessment of Violence: None Noted Violent Behavior Description: none noted Does patient have access to weapons?: No Criminal Charges Pending?: No Does patient have a court date: No Is patient on probation?: No  Psychosis Hallucinations: None noted Delusions: Erotomanic, Grandiose, Persecutory, Unspecified  Mental Status Report Appearance/Hygiene: In scrubs Eye Contact: Good Motor Activity: Freedom of movement Speech: Pressured, Rapid Level of Consciousness: Quiet/awake Mood: Suspicious Affect: Appropriate to circumstance Anxiety Level: Minimal Thought Processes: Flight of Ideas, Irrelevant Judgement: Impaired Orientation: Unable to assess Obsessive Compulsive Thoughts/Behaviors: Unable to Assess  Cognitive Functioning Concentration: Decreased Memory: Recent Intact, Remote Intact Is patient IDD: No Insight: Poor Impulse Control: Poor Appetite: Good Have you had any weight changes? : No Change Sleep: Unable to Assess Vegetative Symptoms: None  ADLScreening [facility] Assessment Services) Patient's cognitive ability adequate to safely complete daily activities?: Yes Patient able to express need for assistance with ADLs?: Yes Independently performs ADLs?: Yes (appropriate for developmental age)  Prior Inpatient Therapy Prior Inpatient Therapy: Yes Prior Therapy Dates: 2018 Prior Therapy Facilty/Provider(s): [facility] Reason for Treatment: Delusions of Grandour    Prior Outpatient Therapy Prior Outpatient Therapy: Yes Prior Therapy Dates: current Prior Therapy Facilty/Provider(s): [facility] Reason for Treatment: Bipolar, schizophreniia Does patient have an ACCT team?: No Does patient have Intensive In-House Services?  : No Does patient have [facility] services? :  No Does patient have P4CC services?: No  ADL Screening (condition at time of admission) Patient's cognitive ability adequate to safely complete daily activities?: Yes Is the patient deaf or have difficulty hearing?: No Does the patient have difficulty seeing, even when wearing glasses/contacts?: No Does the patient have difficulty concentrating, remembering, or making decisions?: No Patient able to express need for assistance with ADLs?: Yes Does the patient have difficulty dressing or bathing?: No Independently performs ADLs?: Yes (appropriate for developmental age) Does the patient have difficulty walking or climbing stairs?: No Weakness of Legs: None Weakness of Arms/Hands: None  Home Assistive Devices/Equipment Home Assistive Devices/Equipment: None  Therapy Consults (therapy consults require a physician order) PT Evaluation Needed: No OT Evalulation Needed: No SLP Evaluation Needed: No Abuse/Neglect Assessment (Assessment to be complete while patient is alone) Abuse/Neglect Assessment Can Be Completed: Unable to assess, patient is non-responsive or altered mental status Values / Beliefs Cultural Requests During Hospitalization: None Spiritual Requests During Hospitalization: None Consults Spiritual Care Consult Needed: No Social Work Consult Needed: No Merchant navy officer (For Healthcare) Does Patient Have a Medical Advance Directive?: No Would patient like information on creating a medical advance directive?: No - Patient declined          Disposition:  Disposition Initial Assessment Completed for this Encounter: Yes Disposition of Patient: (Pending psych disposition) Patient refused recommended treatment: No Mode of transportation if patient is discharged?: Car  On Site Evaluation by:   Reviewed with Physician:    Shaneca Orne D Marlea Gambill 10/30/2017 2:02 AM

## 2017-10-30 NOTE — ED Notes (Signed)
Pt. Came to ED via [employer] for displaying odd behavior.  Pt. States "I am the whistle-blower they talk about on tv".  Pt. While calm and cooperative, voicing multiple stories of grandeur.  (owner of [employer], multiple degrees, informant for government).

## 2017-10-30 NOTE — ED Notes (Signed)
Pt's son came to visit. RN updated on patient's status (SOC recommended inpatient treatment).    Maintained on 15 minute checks and observation by security camera for safety.

## 2017-10-30 NOTE — ED Notes (Signed)
Pt speaking with SOC.  Maintained on 15 minute checks and observation by security camera for safety. 

## 2017-10-30 NOTE — ED Notes (Addendum)
Pt to nurses station door requesting a drink. 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 Pt wanting staff to research various scandals to make [place] aware of his involvement.  Pt also stated he is marry Barrister's clerk at the [employer]. Austin Rhodes is his best man and it will be on [employer].   Maintained on 15 minute checks and observation by security camera for safety.

## 2017-10-30 NOTE — ED Notes (Signed)
Pt taking a shower. Maintained on 15 minute checks and observation by security camera for safety. 

## 2017-10-31 ENCOUNTER — Other Ambulatory Visit: Payer: Self-pay

## 2017-10-31 ENCOUNTER — Inpatient Hospital Stay
Admission: AD | Admit: 2017-10-31 | Discharge: 2017-11-10 | DRG: 885 | Disposition: A | Discharge status: Home / Self Care | Payer: Medicare Other | Attending: Psychiatry | Admitting: Psychiatry

## 2017-10-31 DIAGNOSIS — Z8673 Personal history of transient ischemic attack (TIA), and cerebral infarction without residual deficits: Secondary | ICD-10-CM

## 2017-10-31 DIAGNOSIS — F4310 Post-traumatic stress disorder, unspecified: Secondary | ICD-10-CM | POA: Diagnosis present

## 2017-10-31 DIAGNOSIS — Z7902 Long term (current) use of antithrombotics/antiplatelets: Secondary | ICD-10-CM

## 2017-10-31 DIAGNOSIS — I2510 Atherosclerotic heart disease of native coronary artery without angina pectoris: Secondary | ICD-10-CM | POA: Diagnosis present

## 2017-10-31 DIAGNOSIS — K219 Gastro-esophageal reflux disease without esophagitis: Secondary | ICD-10-CM | POA: Diagnosis present

## 2017-10-31 DIAGNOSIS — Z79899 Other long term (current) drug therapy: Secondary | ICD-10-CM | POA: Diagnosis not present

## 2017-10-31 DIAGNOSIS — F319 Bipolar disorder, unspecified: Secondary | ICD-10-CM | POA: Diagnosis present

## 2017-10-31 DIAGNOSIS — Z9181 History of falling: Secondary | ICD-10-CM

## 2017-10-31 DIAGNOSIS — F312 Bipolar disorder, current episode manic severe with psychotic features: Principal | ICD-10-CM | POA: Diagnosis present

## 2017-10-31 DIAGNOSIS — Z23 Encounter for immunization: Secondary | ICD-10-CM

## 2017-10-31 DIAGNOSIS — I10 Essential (primary) hypertension: Secondary | ICD-10-CM | POA: Diagnosis present

## 2017-10-31 DIAGNOSIS — F410 Panic disorder [episodic paroxysmal anxiety] without agoraphobia: Secondary | ICD-10-CM | POA: Diagnosis not present

## 2017-10-31 MED ORDER — ROSUVASTATIN CALCIUM 20 MG PO TABS
10.0000 mg | ORAL_TABLET | Freq: Every day | ORAL | Status: DC
  Administered 2017-11-01 – 2017-11-10 (×10): 10 mg via ORAL
  Filled 2017-10-31 (×10): qty 1

## 2017-10-31 MED ORDER — METOPROLOL SUCCINATE ER 25 MG PO TB24
25.0000 mg | ORAL_TABLET | Freq: Every day | ORAL | Status: DC
  Administered 2017-11-01 – 2017-11-10 (×10): 25 mg via ORAL
  Filled 2017-10-31 (×11): qty 1

## 2017-10-31 MED ORDER — QUETIAPINE FUMARATE 200 MG PO TABS
200.0000 mg | ORAL_TABLET | Freq: Every day | ORAL | Status: DC
  Administered 2017-11-01: 200 mg via ORAL
  Filled 2017-10-31: qty 1

## 2017-10-31 MED ORDER — MAGNESIUM HYDROXIDE 400 MG/5ML PO SUSP: 30.0000 mL | Freq: Every day | ORAL | Status: DC | PRN

## 2017-10-31 MED ORDER — TRAZODONE HCL 50 MG PO TABS
50.0000 mg | ORAL_TABLET | Freq: Every evening | ORAL | Status: DC | PRN
  Administered 2017-11-01 – 2017-11-08 (×4): 50 mg via ORAL
  Filled 2017-10-31 (×4): qty 1

## 2017-10-31 MED ORDER — LAMOTRIGINE 100 MG PO TABS
200.0000 mg | ORAL_TABLET | Freq: Every day | ORAL | Status: DC
  Administered 2017-11-01 – 2017-11-05 (×5): 200 mg via ORAL
  Filled 2017-10-31 (×5): qty 2

## 2017-10-31 MED ORDER — ALUM & MAG HYDROXIDE-SIMETH 200-200-20 MG/5ML PO SUSP: 30.0000 mL | ORAL | Status: DC | PRN

## 2017-10-31 MED ORDER — DIVALPROEX SODIUM 500 MG PO DR TAB
500.0000 mg | DELAYED_RELEASE_TABLET | Freq: Two times a day (BID) | ORAL | Status: DC
  Administered 2017-11-01 – 2017-11-07 (×14): 500 mg via ORAL
  Filled 2017-10-31 (×14): qty 1

## 2017-10-31 MED ORDER — ACETAMINOPHEN 325 MG PO TABS: 650.0000 mg | ORAL_TABLET | Freq: Four times a day (QID) | ORAL | Status: DC | PRN

## 2017-10-31 MED ORDER — HYDROXYZINE HCL 25 MG PO TABS: 25.0000 mg | ORAL_TABLET | Freq: Three times a day (TID) | ORAL | Status: DC | PRN

## 2017-10-31 NOTE — Tx Team (Signed)
Initial Treatment Plan 10/31/2017 11:47 PM Travaughn Vue Decou [id number]    PATIENT STRESSORS: Financial difficulties Health problems Medication change or noncompliance Occupational concerns   PATIENT STRENGTHS: Capable of independent living Motivation for treatment/growth Physical Health   PATIENT IDENTIFIED PROBLEMS: Depression/Anxiety    Delusions     Moderately confused    None compliant with his medications         DISCHARGE CRITERIA:  Improved stabilization in mood, thinking, and/or behavior Medical problems require only outpatient monitoring Motivation to continue treatment in a less acute level of care Reduction of life-threatening or endangering symptoms to within safe limits  PRELIMINARY DISCHARGE PLAN: Outpatient therapy Participate in family therapy Return to previous living arrangement  PATIENT/FAMILY INVOLVEMENT: This treatment plan has been presented to and reviewed with the patient, Ashok Croon,.  The patient  have been given the opportunity to ask questions and make suggestions.  Lelan Pons, RN 10/31/2017, 11:47 PM

## 2017-10-31 NOTE — BH Assessment (Addendum)
Referral information for Psychiatric Hospitalization faxed to;    [facility] [phone]),    [facility] ([phone]---[phone]),   Austin Rhodes [phone] or [phone])   [address] [phone]),    Austin Rhodes [phone]),    [facility] [phone] or [phone]),   [facility] [phone]),   [facility] [phone])   [facility] [phone])  Patient referred to other facilities per the instructions of [facility]). [facility] BMU staffing would not allow for patient to transfer to the unit at this time.

## 2017-10-31 NOTE — ED Provider Notes (Signed)
Vitals:   10/30/17 1606 10/30/17 1925  BP: 135/75 121/63  Pulse: 77 81  Resp: 18 16  Temp:  98.2 F (36.8 C)  SpO2: 100% 100%   No acute events reported to me overnight in report.  Patient is accepted to [facility] behavioral medicine for psychiatric admission inpatient.   Governor Rooks, MD 10/31/17 [phone]

## 2017-10-31 NOTE — ED Notes (Signed)
Patient received PM snack. 

## 2017-10-31 NOTE — ED Notes (Signed)
Pt IVC has been accept to [facility] bed 313  on 10/31/17 after 9:00 am.

## 2017-10-31 NOTE — BH Assessment (Signed)
[facility] (Shaleta) was able to secure the staff member needed in order to have patient transfer to [facility] BMU. We will move forward the original plan for the patient move to the BMU. Patient nurse (Olivette) and ER Secretary (Ronnie) are aware of the plan. 

## 2017-10-31 NOTE — ED Notes (Signed)
Pt given supper tray.

## 2017-10-31 NOTE — Progress Notes (Signed)
Pt A & O to self, place and event. Presents animated, hyper-verbal, tangential / confuse and delusional (grandeur) on interactions. Per pt "I'm well rested, I had a good night sleep and I will like to leave now". 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 Pt also believes he is the "whistle blower to the Trump case, I did that , I'm tired of all this, I need my phone to call my staff, I have drones everywhere". Vitals done. Scheduled medications given as ordered with verbal education and effects monitored. Support and encouragement offered. Safety checks maintained without self harm gestures. Pt receptive to care. Compliant with medications when offered. Denies concerns or adverse drug reactions when assessed. Pending transfer to BMU.

## 2017-11-01 ENCOUNTER — Encounter: Payer: Self-pay | Admitting: Psychiatry

## 2017-11-01 DIAGNOSIS — F312 Bipolar disorder, current episode manic severe with psychotic features: Principal | ICD-10-CM

## 2017-11-01 DIAGNOSIS — I10 Essential (primary) hypertension: Secondary | ICD-10-CM | POA: Diagnosis present

## 2017-11-01 MED ORDER — PNEUMOCOCCAL VAC POLYVALENT 25 MCG/0.5ML IJ INJ
0.5000 mL | INJECTION | INTRAMUSCULAR | Status: DC
  Filled 2017-11-01: qty 0.5

## 2017-11-01 MED ORDER — INFLUENZA VAC SPLIT HIGH-DOSE 0.5 ML IM SUSY
0.5000 mL | PREFILLED_SYRINGE | INTRAMUSCULAR | Status: AC
  Administered 2017-11-02: 0.5 mL via INTRAMUSCULAR
  Filled 2017-11-01 (×2): qty 0.5

## 2017-11-01 NOTE — Progress Notes (Signed)
   11/01/17 1030  Clinical Encounter Type  Visited With Patient  Visit Type Initial;Spiritual support;Behavioral Health  Referral From Social work  Consult/Referral To Chaplain  Spiritual Encounters  Spiritual Needs Emotional;Other (Comment)   CH attended the patient's treatment team meeting. The patient appears to be having a wide imagination as he sates that his purpose for his hospital stay was to make others aware of the harm that plastic is causing the world. Mr. Doo states that he has invented a way to get rid of plastic and many companies are currently using his invention. Patient also states that he wants to prove that [employer] is doubling billing people.

## 2017-11-01 NOTE — Tx Team (Addendum)
Interdisciplinary Treatment and Diagnostic Plan Update  11/01/2017 Time of Session: 10:30am MATHIEU Rhodes MRN: [id number]  Principal Diagnosis: Bipolar I disorder, current or most recent episode manic, with psychotic features (HCC)  Secondary Diagnoses: Principal Problem:   Bipolar I disorder, current or most recent episode manic, with psychotic features (HCC) Active Problems:   HTN (hypertension)   Current Medications:  Current Facility-Administered Medications  Medication Dose Route Frequency Provider Last Rate Last Dose  . acetaminophen (TYLENOL) tablet 650 mg  650 mg Oral Q6H PRN Beverly Sessions, MD      . alum & mag hydroxide-simeth (MAALOX/MYLANTA) 200-200-20 MG/5ML suspension 30 mL  30 mL Oral Q4H PRN Beverly Sessions, MD      . divalproex (DEPAKOTE) DR tablet 500 mg  500 mg Oral BID Beverly Sessions, MD   500 mg at 11/01/17 4098  . hydrOXYzine (ATARAX/VISTARIL) tablet 25 mg  25 mg Oral TID PRN Beverly Sessions, MD      . Austin Rhodes ON 11/02/2017] Influenza vac split quadrivalent PF (FLUZONE HIGH-DOSE) injection 0.5 mL  0.5 mL Intramuscular Tomorrow-1000 Pucilowska, [name], MD      . lamoTRIgine (LAMICTAL) tablet 200 mg  200 mg Oral Daily Beverly Sessions, MD   200 mg at 11/01/17 0807  . magnesium hydroxide (MILK OF MAGNESIA) suspension 30 mL  30 mL Oral Daily PRN Beverly Sessions, MD      . metoprolol succinate (TOPROL-XL) 24 hr tablet 25 mg  25 mg Oral Daily Beverly Sessions, MD   25 mg at 11/01/17 0807  . [START ON 11/02/2017] pneumococcal 23 valent vaccine (PNU-IMMUNE) injection 0.5 mL  0.5 mL Intramuscular Tomorrow-1000 Pucilowska, [name], MD      . QUEtiapine (SEROQUEL) tablet 200 mg  200 mg Oral QHS Beverly Sessions, MD      . rosuvastatin (CRESTOR) tablet 10 mg  10 mg Oral Daily Beverly Sessions, MD      . traZODone (DESYREL) tablet 50 mg  50 mg Oral QHS PRN Beverly Sessions, MD       PTA Medications: Medications Prior to Admission  Medication Sig  Dispense Refill Last Dose  . B Complex-Biotin-FA (B COMPLETE PO) Take 1 tablet by mouth daily.   Not Taking at Unknown time  . clopidogrel (PLAVIX) 75 MG tablet Take 1 tablet (75 mg total) by mouth daily. (Patient not taking: Reported on 10/29/2017) 30 tablet 3 Not Taking at Unknown time  . divalproex (DEPAKOTE ER) 500 MG 24 hr tablet Take 500 mg by mouth 2 (two) times daily.  0 Not Taking at Unknown time  . lamoTRIgine (LAMICTAL) 200 MG tablet Take 200 mg by mouth daily.  0 Not Taking at Unknown time  . metoprolol succinate (TOPROL-XL) 25 MG 24 hr tablet Take 25 mg by mouth daily.   Not Taking at Unknown time  . Multiple Vitamin (MULTIVITAMIN WITH MINERALS) TABS tablet Take 1 tablet by mouth daily.   Not Taking at Unknown time  . rosuvastatin (CRESTOR) 10 MG tablet Take 10 mg by mouth daily.   Not Taking at Unknown time  . SEROQUEL XR 200 MG 24 hr tablet Take 200 mg by mouth at bedtime.   0 Not Taking at Unknown time    Patient Stressors: Financial difficulties Health problems Medication change or noncompliance Occupational concerns  Patient Strengths: Capable of independent living Motivation for treatment/growth Physical Health  Treatment Modalities: Medication Management, Group therapy, Case management,  1 to 1 session with clinician, Psychoeducation, Recreational therapy.   Physician Treatment Plan for  Primary Diagnosis: Bipolar I disorder, current or most recent episode manic, with psychotic features (HCC) Long Term Goal(s): Improvement in symptoms so as ready for discharge Improvement in symptoms so as ready for discharge   Short Term Goals: Ability to identify changes in lifestyle to reduce recurrence of condition will improve Ability to verbalize feelings will improve Ability to disclose and discuss suicidal ideas Ability to demonstrate self-control will improve Ability to identify and develop effective coping behaviors will improve Ability to maintain clinical measurements  within normal limits will improve Compliance with prescribed medications will improve Ability to identify triggers associated with substance abuse/mental health issues will improve NA  Medication Management: Evaluate patient's response, side effects, and tolerance of medication regimen.  Therapeutic Interventions: 1 to 1 sessions, Unit Group sessions and Medication administration.  Evaluation of Outcomes: Progressing  Physician Treatment Plan for Secondary Diagnosis: Principal Problem:   Bipolar I disorder, current or most recent episode manic, with psychotic features (HCC) Active Problems:   HTN (hypertension)  Long Term Goal(s): Improvement in symptoms so as ready for discharge Improvement in symptoms so as ready for discharge   Short Term Goals: Ability to identify changes in lifestyle to reduce recurrence of condition will improve Ability to verbalize feelings will improve Ability to disclose and discuss suicidal ideas Ability to demonstrate self-control will improve Ability to identify and develop effective coping behaviors will improve Ability to maintain clinical measurements within normal limits will improve Compliance with prescribed medications will improve Ability to identify triggers associated with substance abuse/mental health issues will improve NA     Medication Management: Evaluate patient's response, side effects, and tolerance of medication regimen.  Therapeutic Interventions: 1 to 1 sessions, Unit Group sessions and Medication administration.  Evaluation of Outcomes: Progressing   RN Treatment Plan for Primary Diagnosis: Bipolar I disorder, current or most recent episode manic, with psychotic features (HCC) Long Term Goal(s): Knowledge of disease and therapeutic regimen to maintain health will improve  Short Term Goals: Ability to identify and develop effective coping behaviors will improve and Compliance with prescribed medications will  improve  Medication Management: RN will administer medications as ordered by provider, will assess and evaluate patient's response and provide education to patient for prescribed medication. RN will report any adverse and/or side effects to prescribing provider.  Therapeutic Interventions: 1 on 1 counseling sessions, Psychoeducation, Medication administration, Evaluate responses to treatment, Monitor vital signs and CBGs as ordered, Perform/monitor CIWA, COWS, AIMS and Fall Risk screenings as ordered, Perform wound care treatments as ordered.  Evaluation of Outcomes: Progressing   LCSW Treatment Plan for Primary Diagnosis: Bipolar I disorder, current or most recent episode manic, with psychotic features (HCC) Long Term Goal(s): Safe transition to appropriate next level of care at discharge, Engage patient in therapeutic group addressing interpersonal concerns.  Short Term Goals: Engage patient in aftercare planning with referrals and resources, Identify triggers associated with mental health/substance abuse issues and Increase skills for wellness and recovery  Therapeutic Interventions: Assess for all discharge needs, 1 to 1 time with Social worker, Explore available resources and support systems, Assess for adequacy in community support network, Educate family and significant other(s) on suicide prevention, Complete Psychosocial Assessment, Interpersonal group therapy.  Evaluation of Outcomes: Progressing   Progress in Treatment: Attending groups: No. Participating in groups: No. Taking medication as prescribed: Yes. Toleration medication: Yes. Family/Significant other contact made: No, will contact:    Patient understands diagnosis: Yes. Discussing patient identified problems/goals with staff: Yes. Medical problems stabilized or  resolved: Yes. Denies suicidal/homicidal ideation: Yes. Issues/concerns per patient self-inventory: Yes. Other:    New problem(s) identified: No, Describe:      New Short Term/Long Term Goal(s):  Patient Goals:  TBD, Pt unable to really state as difficult to redirect for delusions of grandiosity   Discharge Plan or Barriers: TBD  Reason for Continuation of Hospitalization: Delusions  Mania Medication stabilization  Coordination of aftercare plan  Estimated Length of Stay:5-7  Recreational Therapy: Patient Stressors: N/A Patient Goal: Patient will focus on task/topic with 2 prompts from staff within 5 recreation therapy group sessions  Attendees: Patient:Austin Rhodes 11/01/2017 5:04 PM  Physician: Braulio Conte Pucilowska 11/01/2017 5:04 PM  Nursing: Hulan Amato, RN 11/01/2017 5:04 PM  RN Care Manager: 11/01/2017 5:04 PM  Social Worker: Jake Shark, LCSW 11/01/2017 5:04 PM  Recreational Therapist: Garret Reddish, LRT 11/01/2017 5:04 PM  Other: Johny Shears, LCSWA 11/01/2017 5:04 PM  Other: Damian Leavell, Chaplain 11/01/2017 5:04 PM  Other: 11/01/2017 5:04 PM    Scribe for Treatment Team: Glennon Mac, LCSW 11/01/2017 5:04 PM

## 2017-11-01 NOTE — [facility] Suicide Risk Assessment (Addendum)
[facility] Admission Suicide Risk Assessment   Nursing information obtained from:  Patient Demographic factors:  Male, Age 65 or older Current Mental Status:  Self-harm behaviors Loss Factors:  NA Historical Factors:  NA Risk Reduction Factors:  Positive therapeutic relationship  Total Time spent with patient: 1 hour Principal Problem: Bipolar I disorder, current or most recent episode manic, with psychotic features (HCC) Diagnosis:   Patient Active Problem List   Diagnosis Date Noted  . Bipolar I disorder, current or most recent episode manic, with psychotic features (HCC) [F31.2] 10/31/2017    Priority: High  . HTN (hypertension) [I10] 11/01/2017  . Chest pain [R07.9] 05/09/2015   Subjective Data: mania  Continued Clinical Symptoms:  Alcohol Use Disorder Identification Test Final Score (AUDIT): 7 The "Alcohol Use Disorders Identification Test", Guidelines for Use in Primary Care, Second Edition.  World Science writer [facility]). Score between 0-7:  no or low risk or alcohol related problems. Score between 8-15:  moderate risk of alcohol related problems. Score between 16-19:  high risk of alcohol related problems. Score 20 or above:  warrants further diagnostic evaluation for alcohol dependence and treatment.   CLINICAL FACTORS:   Bipolar Disorder:   Mixed State Currently Psychotic   Musculoskeletal: Strength & Muscle Tone: within normal limits Gait & Station: normal Patient leans: N/A  Psychiatric Specialty Exam: Physical Exam  Nursing note and vitals reviewed. Psychiatric: His affect is labile and inappropriate. His speech is rapid and/or pressured. He is hyperactive. Thought content is paranoid and delusional. Cognition and memory are impaired. He expresses impulsivity.    Review of Systems  Psychiatric/Behavioral: The patient has insomnia.   All other systems reviewed and are negative.   Blood pressure 125/85, pulse 95, temperature 97.7 F (36.5 C), temperature  source Oral, resp. rate 18, height 5\' 9"  (1.753 m), weight 85.3 kg, SpO2 98 %.Body mass index is 27.76 kg/m.  General Appearance: Fairly Groomed  Eye Contact:  Good  Speech:  Pressured  Volume:  Normal  Mood:  Euphoric  Affect:  Congruent  Thought Process:  Disorganized, Irrelevant and Descriptions of Associations: Tangential  Orientation:  Full (Time, Place, and Person)  Thought Content:  Delusions and Paranoid Ideation  Suicidal Thoughts:  No  Homicidal Thoughts:  No  Memory:  Immediate;   Fair Recent;   Fair Remote;   Fair  Judgement:  Poor  Insight:  Lacking  Psychomotor Activity:  Increased  Concentration:  Concentration: Fair and Attention Span: Fair  Recall:  [employer] of Knowledge:  Fair  Language:  Fair  Akathisia:  No  Handed:  Right  AIMS (if indicated):     Assets:  Communication Skills Desire for Improvement Financial Resources/Insurance Housing Physical Health Resilience Social Support Transportation  ADL's:  Intact  Cognition:  WNL  Sleep:  Number of Hours: 6.45      COGNITIVE FEATURES THAT CONTRIBUTE TO RISK:  None    SUICIDE RISK:   Minimal: No identifiable suicidal ideation.  Patients presenting with no risk factors but with morbid ruminations; may be classified as minimal risk based on the severity of the depressive symptoms  PLAN OF CARE: hospital admission, medication management, discharge planning.  Austin Rhodes is a 67 year old male with a history of bipolar disorder admitted for a manic episode.  #Mood/psychosis -continue Depakote 500 mg BID -Lamictal 200 mg daily -Seroquel 200 mg nightly -Trazodone 100 mg nightly PRN  #HTN -Metoprolol 12.5 mg BID -Crestorl 10 mg daily  #Labs -lipid panel, TSH, A1C -EKG  #  Disposition -discharge to home -follow up with [facility]  I certify that inpatient services furnished can reasonably be expected to improve the patient's condition.   Kristine Linea, MD 11/01/2017, 3:03 PM

## 2017-11-01 NOTE — Progress Notes (Signed)
New involuntary  admit to the unit, alert and moderately confused , responding to internal stimuli with delusions of grandeur , calm and none violent, unit guidelines and expected behaviors discussed , cold tray and beverages provided, hygiene products provided, room and orientation complete, body search complete no contraband found, skin check done by two RNs Alex/Terry  RN, multiple bruise site noted on bilateral abdominal areas and on the right arm area . Patient denies any SI/HI/AVH at this time

## 2017-11-01 NOTE — [facility] Group Notes (Signed)
[facility] Group Notes:  (Nursing/MHT/Case Management/Adjunct)  Date:  11/01/2017  Time:  9:41 AM  Type of Therapy:  Psychoeducational Skills  Participation Level:  Did Not Attend  Foy Guadalajara 11/01/2017, 9:41 AM

## 2017-11-01 NOTE — Progress Notes (Signed)
Patient ID: Austin Rhodes, male   DOB: 03/30/1950, 67 y.o.   MRN: [id number] Pt remains grandiose and manic. CSW will attempt assessment tomorrow.  Jake Shark, LCSW

## 2017-11-01 NOTE — Plan of Care (Signed)
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

## 2017-11-01 NOTE — Progress Notes (Signed)
Recreation Therapy Notes  Date: 11/01/2017  Time: 9:30 am  Location: Craft Room  Behavioral response: Appropriate   Intervention Topic: Team Work  Discussion/Intervention:  Group content on today was focused on teamwork. The group identified what teamwork is. Individuals described who is a part of their team. Patients expressed why they thought teamwork is important. The group stated reasons why they thought it was easier to work with a Comptroller team. Individuals discussed some positives and negatives of working with a team. Patients gave examples of past experiences they had while working with a team. The group participated in the intervention "Story in a bag", patients were in groups and were able to test their skill in a team setting.  Clinical Observations/Feedback:  Patient attended group and stated he get anxiety when working in a large team. Individual was social with peers and staff while participating in the intervention.  Bonniejean Piano LRT/CTRS         Kimbree Casanas 11/01/2017 1:04 PM

## 2017-11-01 NOTE — Progress Notes (Signed)
Patient ID: Austin Rhodes, male   DOB: [birth year], 67 y.o.   MRN: [id number] Per State regulations 482.30 this chart was reviewed for medical necessity with respect to the patient's admission/duration of stay.    Next review date: 11/04/17  Thurman Coyer, BSN, RN-BC  Case Manager

## 2017-11-01 NOTE — Progress Notes (Signed)
Recreation Therapy Notes  INPATIENT RECREATION THERAPY ASSESSMENT  Patient Details Name: BRAYTEN KOMAR MRN: [id number] DOB: 10-Feb-1950 Today's Date: 11/01/2017       Information Obtained From: (Patient unable to focus on qestions asked he goes off into talking about the many projects he is working on )  Able to [employer] in Assessment/Interview:    Patient Presentation: Hyperverbal, Responsive  Reason for Admission (Per Patient): Med Non-Compliance  Patient Stressors:    Coping Skills:   Read  Leisure Interests (2+):  Individual - Reading  Frequency of Recreation/Participation:    Awareness of Community Resources:     [employer]:     Current Use:    If no, Barriers?:    Expressed Interest in [employer] Information:    [place] of Residence:     Patient Main Form of Transportation: Set designer  Patient Strengths:     Patient Identified Areas of Improvement:     Patient Goal for Hospitalization:     Current SI (including self-harm):     Current HI:     Current AVH:    Staff Intervention Plan: Group Attendance, Collaborate with Interdisciplinary Treatment Team  Consent to Intern Participation:    Deasha Clendenin 11/01/2017, 3:56 PM

## 2017-11-01 NOTE — H&P (Addendum)
Psychiatric Admission Assessment Adult  Patient Identification: Austin Rhodes MRN:  [id number] Date of Evaluation:  11/01/2017 Chief Complaint:  Bipolar Disorder  Principal Diagnosis: Bipolar I disorder, current or most recent episode manic, with psychotic features [facility]) Diagnosis:   Patient Active Problem List   Diagnosis Date Noted  . Bipolar I disorder, current or most recent episode manic, with psychotic features (HCC) [F31.2] 10/31/2017    Priority: High  . HTN (hypertension) [I10] 11/01/2017  . Chest pain [R07.9] 05/09/2015   History of Present Illness:   Identifying data. Mr. Dillinger is a 67 year old male with a history of bipolar disorder.  Chief complaint. "I have a lot of famous friends."  History of present illness. Information was obtained from the patient and the chart. The patient was brought to the ER for bizarre behavior in the course of manic episode most likely resulting in medication administration. Apparently, the patient has been stable on a combination of Seroquel, Lamictal and Depakote since his last hospitalization in [place]. The patient complains that his bubble packs were "out of order". The patient is loud, euphoric, grandiose and very talkative. He has multiple degrees, power, connections and money. 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 Overall, he is pleasantly manic. He is probably better already as today he only has 4 (likely true) and not 10 children with famous women. The patient himself, denies any problesms or symptoms of depression, anxiety, psychosis or substance use.   Past psychiatric history. History of bipolar. Hospitalized twice at [facility], and recently in [place]. In the care of [facility].  Family psychiatric history. Unknown.  Social history. He is retired and lives independently. 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 His daughter lives locally and is involved.   Total Time spent with patient: 1 hour  Is the patient at  risk to self? No.  Has the patient been a risk to self in the past 6 months? No.  Has the patient been a risk to self within the distant past? No.  Is the patient a risk to others? No.  Has the patient been a risk to others in the past 6 months? No.  Has the patient been a risk to others within the distant past? No.   Prior Inpatient Therapy:   Prior Outpatient Therapy:    Alcohol Screening: 1. How often do you have a drink containing alcohol?: Monthly or less 2. How many drinks containing alcohol do you have on a typical day when you are drinking?: 3 or 4 3. How often do you have six or more drinks on one occasion?: Never AUDIT-C Score: 2 4. How often during the last year have you found that you were not able to stop drinking once you had started?: Less than monthly 5. How often during the last year have you failed to do what was normally expected from you becasue of drinking?: Less than monthly 6. How often during the last year have you needed a first drink in the morning to get yourself going after a heavy drinking session?: Less than monthly 7. How often during the last year have you had a feeling of guilt of remorse after drinking?: Less than monthly 8. How often during the last year have you been unable to remember what happened the night before because you had been drinking?: Less than monthly 9. Have you or someone else been injured as a result of your drinking?: No 10. Has a relative or friend or a doctor or another health  worker been concerned about your drinking or suggested you cut down?: No Alcohol Use Disorder Identification Test Final Score (AUDIT): 7 Intervention/Follow-up: Alcohol Education Substance Abuse History in the last 12 months:  No. Consequences of Substance Abuse: NA Previous Psychotropic Medications: Yes  Psychological Evaluations: No  Past Medical History:  Past Medical History:  Diagnosis Date  . Bipolar disorder (HCC)   . Coronary artery disease   .  GERD (gastroesophageal reflux disease)   . History of multiple strokes   . PTSD (post-traumatic stress disorder)     Past Surgical History:  Procedure Laterality Date  . CARDIAC CATHETERIZATION     Family History: History reviewed. No pertinent family history.  Tobacco Screening: Have you used any form of tobacco in the last 30 days? (Cigarettes, Smokeless Tobacco, Cigars, and/or Pipes): No Social History:  Social History   Substance and Sexual Activity  Alcohol Use No     Social History   Substance and Sexual Activity  Drug Use Not on file    Additional Social History:                           Allergies:  No Known Allergies Lab Results: No results found for this or any previous visit (from the past 48 hour(s)).  Blood Alcohol level:  Lab Results  Component Value Date   ETH <10 10/29/2017   ETH <10 03/31/2017    Metabolic Disorder Labs:  Lab Results  Component Value Date   HGBA1C  01/29/2009    5.9 (NOTE) The ADA recommends the following therapeutic goal for glycemic control related to Hgb A1c measurement: Goal of therapy: <6.5 Hgb A1c  Reference: American Diabetes Association: Clinical Practice Recommendations 2010, Diabetes Care, 2010, 33: (Suppl  1).   MPG 123 01/29/2009   No results found for: PROLACTIN Lab Results  Component Value Date   CHOL 93 05/10/2015   TRIG 75 05/10/2015   HDL 39 (L) 05/10/2015   CHOLHDL 2.4 05/10/2015   VLDL 15 05/10/2015   LDLCALC 39 05/10/2015   LDLCALC  01/29/2009    89        Total Cholesterol/HDL:CHD Risk Coronary Heart Disease Risk Table                     Men   Women  1/2 Average Risk   3.4   3.3  Average Risk       5.0   4.4  2 X Average Risk   9.6   7.1  3 X Average Risk  23.4   11.0        Use the calculated Patient Ratio above and the CHD Risk Table to determine the patient's CHD Risk.        ATP III CLASSIFICATION (LDL):  <100     mg/dL   Optimal  161-096  mg/dL   Near or Above                     Optimal  130-159  mg/dL   Borderline  045-409  mg/dL   High  >811     mg/dL   Very High    Current Medications: Current Facility-Administered Medications  Medication Dose Route Frequency Provider Last Rate Last Dose  . acetaminophen (TYLENOL) tablet 650 mg  650 mg Oral Q6H PRN Beverly Sessions, MD      . alum & mag hydroxide-simeth (MAALOX/MYLANTA) 200-200-20 MG/5ML suspension 30 mL  30  mL Oral Q4H PRN Beverly Sessions, MD      . divalproex (DEPAKOTE) DR tablet 500 mg  500 mg Oral BID Beverly Sessions, MD   500 mg at 11/01/17 1610  . hydrOXYzine (ATARAX/VISTARIL) tablet 25 mg  25 mg Oral TID PRN Beverly Sessions, MD      . [name] ON 11/02/2017] Influenza vac split quadrivalent PF (FLUZONE HIGH-DOSE) injection 0.5 mL  0.5 mL Intramuscular Tomorrow-1000 Taylore Hinde B, MD      . lamoTRIgine (LAMICTAL) tablet 200 mg  200 mg Oral Daily Beverly Sessions, MD   200 mg at 11/01/17 0807  . magnesium hydroxide (MILK OF MAGNESIA) suspension 30 mL  30 mL Oral Daily PRN Beverly Sessions, MD      . metoprolol succinate (TOPROL-XL) 24 hr tablet 25 mg  25 mg Oral Daily Beverly Sessions, MD   25 mg at 11/01/17 0807  . [START ON 11/02/2017] pneumococcal 23 valent vaccine (PNU-IMMUNE) injection 0.5 mL  0.5 mL Intramuscular Tomorrow-1000 Mckaylah Bettendorf B, MD      . QUEtiapine (SEROQUEL) tablet 200 mg  200 mg Oral QHS Beverly Sessions, MD      . rosuvastatin (CRESTOR) tablet 10 mg  10 mg Oral Daily Beverly Sessions, MD      . traZODone (DESYREL) tablet 50 mg  50 mg Oral QHS PRN Beverly Sessions, MD       PTA Medications: Medications Prior to Admission  Medication Sig Dispense Refill Last Dose  . B Complex-Biotin-FA (B COMPLETE PO) Take 1 tablet by mouth daily.   Not Taking at Unknown time  . clopidogrel (PLAVIX) 75 MG tablet Take 1 tablet (75 mg total) by mouth daily. (Patient not taking: Reported on 10/29/2017) 30 tablet 3 Not Taking at Unknown time  . divalproex (DEPAKOTE ER) 500 MG 24  hr tablet Take 500 mg by mouth 2 (two) times daily.  0 Not Taking at Unknown time  . lamoTRIgine (LAMICTAL) 200 MG tablet Take 200 mg by mouth daily.  0 Not Taking at Unknown time  . metoprolol succinate (TOPROL-XL) 25 MG 24 hr tablet Take 25 mg by mouth daily.   Not Taking at Unknown time  . Multiple Vitamin (MULTIVITAMIN WITH MINERALS) TABS tablet Take 1 tablet by mouth daily.   Not Taking at Unknown time  . rosuvastatin (CRESTOR) 10 MG tablet Take 10 mg by mouth daily.   Not Taking at Unknown time  . SEROQUEL XR 200 MG 24 hr tablet Take 200 mg by mouth at bedtime.   0 Not Taking at Unknown time    Musculoskeletal: Strength & Muscle Tone: within normal limits Gait & Station: normal Patient leans: N/A  Psychiatric Specialty Exam: Physical Exam  Nursing note and vitals reviewed. Constitutional: He is oriented to person, place, and time. He appears well-developed and well-nourished.  HENT:  Head: Normocephalic and atraumatic.  Eyes: Pupils are equal, round, and reactive to light. Conjunctivae and EOM are normal.  Neck: Normal range of motion.  Cardiovascular: Normal rate, regular rhythm and normal heart sounds.  Respiratory: Effort normal and breath sounds normal.  GI: Soft. Bowel sounds are normal.  Musculoskeletal: Normal range of motion.  Neurological: He is alert and oriented to person, place, and time.  Skin: Skin is warm and dry.  Psychiatric: His affect is labile and inappropriate. His speech is rapid and/or pressured. He is hyperactive. Thought content is paranoid and delusional. Cognition and memory are impaired. He expresses impulsivity.    Review of Systems  Psychiatric/Behavioral: The patient  has insomnia.   All other systems reviewed and are negative.   Blood pressure 125/85, pulse 95, temperature 97.7 F (36.5 C), temperature source Oral, resp. rate 18, height 5\' 9"  (1.753 m), weight 85.3 kg, SpO2 98 %.Body mass index is 27.76 kg/m.  See SRA                                                   Sleep:  Number of Hours: 6.45    Treatment Plan Summary: Daily contact with patient to assess and evaluate symptoms and progress in treatment and Medication management   Mr. Rakestraw is a 67 year old male with a history of bipolar disorder admitted for a manic episode.  #Mood/psychosis -continue Depakote 500 mg BID -Lamictal 200 mg daily -Seroquel 200 mg nightly -Trazodone 100 mg nightly PRN  #HTN -Metoprolol 12.5 mg BID -Crestorl 10 mg daily  #Labs -lipid panel, TSH, A1C -EKG  #Disposition -discharge to home -follow up with [facility]   Observation Level/Precautions:  15 minute checks  Laboratory:  CBC Chemistry Profile UDS UA  Psychotherapy:    Medications:    Consultations:    Discharge Concerns:    Estimated LOS:  Other:     Physician Treatment Plan for Primary Diagnosis: Bipolar I disorder, current or most recent episode manic, with psychotic features (HCC) Long Term Goal(s): Improvement in symptoms so as ready for discharge  Short Term Goals: Ability to identify changes in lifestyle to reduce recurrence of condition will improve, Ability to verbalize feelings will improve, Ability to disclose and discuss suicidal ideas, Ability to demonstrate self-control will improve, Ability to identify and develop effective coping behaviors will improve, Ability to maintain clinical measurements within normal limits will improve, Compliance with prescribed medications will improve and Ability to identify triggers associated with substance abuse/mental health issues will improve  Physician Treatment Plan for Secondary Diagnosis: Principal Problem:   Bipolar I disorder, current or most recent episode manic, with psychotic features (HCC) Active Problems:   HTN (hypertension)  Long Term Goal(s): Improvement in symptoms so as ready for discharge  Short Term Goals: NA  I certify that inpatient services furnished can reasonably be  expected to improve the patient's condition.    Kristine Linea, MD 9/30/20193:03 PM

## 2017-11-02 MED ORDER — QUETIAPINE FUMARATE 200 MG PO TABS
300.0000 mg | ORAL_TABLET | Freq: Every day | ORAL | Status: DC
  Administered 2017-11-02: 300 mg via ORAL
  Filled 2017-11-02: qty 1

## 2017-11-02 NOTE — [facility] Counselor (Signed)
Adult Comprehensive Assessment  Patient ID: Austin Rhodes, male   DOB: 1950-05-07, 67 y.o.   MRN: [id number]  Information Source: Information source: Patient  Current Stressors:  Patient states their primary concerns and needs for treatment are:: to get a break from everything that's going on Patient states their goals for this hospitilization and ongoing recovery are:: TO be discharged soon Employment / Job issues: Pt verbalizes job stress, but this is likely grandiose thinking.  Living/Environment/Situation:  Living Arrangements: Alone Living conditions (as described by patient or guardian): lives at [facility] where he says he likes it and that the other residents are quiet and good.  Family History:  Marital status: Single Does patient have children?: Yes How many children?: 4 How is patient's relationship with their children?: says he has 3 boys and a girl and that he has good relationships with them.    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  that he spends a lot of time with.  Leisure/Recreation:   Leisure and Hobbies: fishing, reading, being outdoors.  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  Discharge Plan:   Currently receiving community mental health services: Yes (From Whom)([facility], but does not want to return there) Patient states concerns and preferences for aftercare planning are: getting bubble pack medications straightened out. Does patient have access to transportation?: Yes Does patient have financial barriers related to discharge medications?: Yes Will patient be returning to same living situation after discharge?: Yes  Summary/Recommendations:   Summary and Recommendations (to be completed by the evaluator): Pt is 67yo male who was brought in to the ER after he reportedly was chasing someone's car.  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 ACcording to the chart Pt has a history of Bipolar disorder and schizophrenia.   While in the hospital he will have the opportunity to participate in groups and therapeutic milieu,. He will have medications managed and assistance with appropriate discharge planning.  Cleda Daub Carmine Youngberg. LCSW 11/02/2017

## 2017-11-02 NOTE — Progress Notes (Signed)
Received Austin Rhodes this AM after breakfast, he was compliant with his medications, He denied all of the psychiatric symptoms including feeling suicidal. He has been OOB in the milieu at intervals throughout the day. He received his flu vaccine this PM.

## 2017-11-02 NOTE — [facility] Suicide Risk Assessment (Signed)
[facility] INPATIENT:  Family/Significant Other Suicide Prevention Education  Suicide Prevention Education:  Contact Attempts: Anothony Bursch, daughter [phone], (name of family member/significant other) has been identified by the patient as the family member/significant other with whom the patient will be residing, and identified as the person(s) who will aid the patient in the event of a mental health crisis.  With written consent from the patient, two attempts were made to provide suicide prevention education, prior to and/or following the patient's discharge.  We were unsuccessful in providing suicide prevention education.  A suicide education pamphlet was given to the patient to share with family/significant other.  Date and time of first attempt: 11/02/2017 Date and time of second attempt:  Glennon Mac, LCSW 11/02/2017, 2:28 PM

## 2017-11-02 NOTE — Plan of Care (Signed)
Patient is responding well to treatment therapy, participating in social activities with peers and communicating needs to providers, compliant with medications and maintaining safety of self and others, denies any SI/HI and no signs of AVH noted, 15 minute safety rounding is in place no distress, Problem: Activity: Goal: Will verbalize the importance of balancing activity with adequate rest periods Outcome: Progressing   Problem: Coping: Goal: Coping ability will improve Outcome: Progressing Goal: Will verbalize feelings Outcome: Progressing   Problem: Coping: Goal: Coping ability will improve Outcome: Progressing Goal: Will verbalize feelings Outcome: Progressing   Problem: Safety: Goal: Ability to disclose and discuss suicidal ideas will improve Outcome: Progressing Goal: Ability to identify and utilize support systems that promote safety will improve Outcome: Progressing   Problem: Education: Goal: Ability to state activities that reduce stress will improve Outcome: Progressing   Problem: Self-Concept: Goal: Ability to identify factors that promote anxiety will improve Outcome: Progressing Goal: Level of anxiety will decrease Outcome: Progressing Goal: Ability to modify response to factors that promote anxiety will improve Outcome: Progressing   Problem: Education: Goal: Knowledge of General Education information will improve Description Including pain rating scale, medication(s)/side effects and non-pharmacologic comfort measures Outcome: Progressing   Problem: Health Behavior/Discharge Planning: Goal: Ability to manage health-related needs will improve Outcome: Progressing   Problem: Clinical Measurements: Goal: Ability to maintain clinical measurements within normal limits will improve Outcome: Progressing Goal: Will remain free from infection Outcome: Progressing Goal: Diagnostic test results will improve Outcome: Progressing Goal: Respiratory complications  will improve Outcome: Progressing Goal: Cardiovascular complication will be avoided Outcome: Progressing   Problem: Activity: Goal: Risk for activity intolerance will decrease Outcome: Progressing   Problem: Nutrition: Goal: Adequate nutrition will be maintained Outcome: Progressing   Problem: Coping: Goal: Level of anxiety will decrease Outcome: Progressing   Problem: Elimination: Goal: Will not experience complications related to bowel motility Outcome: Progressing Goal: Will not experience complications related to urinary retention Outcome: Progressing   Problem: Pain Managment: Goal: General experience of comfort will improve Outcome: Progressing   Problem: Safety: Goal: Ability to remain free from injury will improve Outcome: Progressing   Problem: Skin Integrity: Goal: Risk for impaired skin integrity will decrease Outcome: Progressing

## 2017-11-02 NOTE — Progress Notes (Signed)
[facility] MD Progress Note  11/02/2017 6:33 PM Austin Rhodes  MRN:  [id number]  Subjective:   Austin Rhodes is still preoccupied with grandiose delusions and completely disorganized in his thinking. He mixes together water, eclipse, [employer], [employer] in one sentence.   Principal Problem: Bipolar I disorder, current or most recent episode manic, with psychotic features (HCC) Diagnosis:   Patient Active Problem List   Diagnosis Date Noted  . Bipolar I disorder, current or most recent episode manic, with psychotic features (HCC) [F31.2] 10/31/2017    Priority: High  . HTN (hypertension) [I10] 11/01/2017  . Chest pain [R07.9] 05/09/2015   Total Time spent with patient: 20 minutes  Past Psychiatric History: bipolar disorder  Past Medical History:  Past Medical History:  Diagnosis Date  . Bipolar disorder (HCC)   . Coronary artery disease   . GERD (gastroesophageal reflux disease)   . History of multiple strokes   . PTSD (post-traumatic stress disorder)     Past Surgical History:  Procedure Laterality Date  . CARDIAC CATHETERIZATION     Family History: History reviewed. No pertinent family history. Family Psychiatric  History: none Social History:  Social History   Substance and Sexual Activity  Alcohol Use No     Social History   Substance and Sexual Activity  Drug Use Not on file    Social History   Socioeconomic History  . Marital status: Legally Separated    Spouse name: Not on file  . Number of children: Not on file  . Years of education: Not on file  . Highest education level: Not on file  Occupational History  . Not on file  Social Needs  . Financial resource strain: Not on file  . Food insecurity:    Worry: Not on file    Inability: Not on file  . Transportation needs:    Medical: Not on file    Non-medical: Not on file  Tobacco Use  . Smoking status: Never Smoker  . Smokeless tobacco: Never Used  Substance and Sexual Activity  . Alcohol use: No  .  Drug use: Not on file  . Sexual activity: Not on file  Lifestyle  . Physical activity:    Days per week: Not on file    Minutes per session: Not on file  . Stress: Not on file  Relationships  . Social connections:    Talks on phone: Not on file    Gets together: Not on file    Attends religious service: Not on file    Active member of club or organization: Not on file    Attends meetings of clubs or organizations: Not on file    Relationship status: Not on file  Other Topics Concern  . Not on file  Social History Narrative  . Not on file   Additional Social History:                         Sleep: Fair  Appetite:  Fair  Current Medications: Current Facility-Administered Medications  Medication Dose Route Frequency Provider Last Rate Last Dose  . acetaminophen (TYLENOL) tablet 650 mg  650 mg Oral Q6H PRN Beverly Sessions, MD      . alum & mag hydroxide-simeth (MAALOX/MYLANTA) 200-200-20 MG/5ML suspension 30 mL  30 mL Oral Q4H PRN Beverly Sessions, MD      . divalproex (DEPAKOTE) DR tablet 500 mg  500 mg Oral BID Beverly Sessions, MD   500 mg at  11/02/17 1608  . hydrOXYzine (ATARAX/VISTARIL) tablet 25 mg  25 mg Oral TID PRN Beverly Sessions, MD      . lamoTRIgine (LAMICTAL) tablet 200 mg  200 mg Oral Daily Beverly Sessions, MD   200 mg at 11/02/17 0846  . magnesium hydroxide (MILK OF MAGNESIA) suspension 30 mL  30 mL Oral Daily PRN Beverly Sessions, MD      . metoprolol succinate (TOPROL-XL) 24 hr tablet 25 mg  25 mg Oral Daily Beverly Sessions, MD   25 mg at 11/02/17 0846  . pneumococcal 23 valent vaccine (PNU-IMMUNE) injection 0.5 mL  0.5 mL Intramuscular Tomorrow-1000 Madisan Bice B, MD      . QUEtiapine (SEROQUEL) tablet 200 mg  200 mg Oral QHS Beverly Sessions, MD   200 mg at 11/01/17 2052  . rosuvastatin (CRESTOR) tablet 10 mg  10 mg Oral Daily Beverly Sessions, MD   10 mg at 11/02/17 1716  . traZODone (DESYREL) tablet 50 mg  50 mg Oral QHS PRN  Beverly Sessions, MD   50 mg at 11/01/17 2052    Lab Results: No results found for this or any previous visit (from the past 48 hour(s)).  Blood Alcohol level:  Lab Results  Component Value Date   ETH <10 10/29/2017   ETH <10 03/31/2017    Metabolic Disorder Labs: Lab Results  Component Value Date   HGBA1C  01/29/2009    5.9 (NOTE) The ADA recommends the following therapeutic goal for glycemic control related to Hgb A1c measurement: Goal of therapy: <6.5 Hgb A1c  Reference: American Diabetes Association: Clinical Practice Recommendations 2010, Diabetes Care, 2010, 33: (Suppl  1).   MPG 123 01/29/2009   No results found for: PROLACTIN Lab Results  Component Value Date   CHOL 93 05/10/2015   TRIG 75 05/10/2015   HDL 39 (L) 05/10/2015   CHOLHDL 2.4 05/10/2015   VLDL 15 05/10/2015   LDLCALC 39 05/10/2015   LDLCALC  01/29/2009    89        Total Cholesterol/HDL:CHD Risk Coronary Heart Disease Risk Table                     Men   Women  1/2 Average Risk   3.4   3.3  Average Risk       5.0   4.4  2 X Average Risk   9.6   7.1  3 X Average Risk  23.4   11.0        Use the calculated Patient Ratio above and the CHD Risk Table to determine the patient's CHD Risk.        ATP III CLASSIFICATION (LDL):  <100     mg/dL   Optimal  161-096  mg/dL   Near or Above                    Optimal  130-159  mg/dL   Borderline  045-409  mg/dL   High  >811     mg/dL   Very High    Physical Findings: AIMS: Facial and Oral Movements Muscles of Facial Expression: None, normal Lips and Perioral Area: None, normal Jaw: None, normal Tongue: None, normal,Extremity Movements Upper (arms, wrists, hands, fingers): None, normal Lower (legs, knees, ankles, toes): None, normal, Trunk Movements Neck, shoulders, hips: None, normal, Overall Severity Severity of abnormal movements (highest score from questions above): None, normal Incapacitation due to abnormal movements: None, normal Patient's  awareness of abnormal movements (rate only  patient's report): No Awareness, Dental Status Current problems with teeth and/or dentures?: No Does patient usually wear dentures?: No  CIWA:  CIWA-Ar Total: 2 COWS:  COWS Total Score: 3  Musculoskeletal: Strength & Muscle Tone: within normal limits Gait & Station: normal Patient leans: N/A  Psychiatric Specialty Exam: Physical Exam  Nursing note and vitals reviewed. Psychiatric: His speech is normal. His affect is inappropriate. He is hyperactive. Thought content is paranoid. Cognition and memory are impaired. He expresses impulsivity.    Review of Systems  Neurological: Negative.   Psychiatric/Behavioral: Positive for hallucinations.  All other systems reviewed and are negative.   Blood pressure 130/79, pulse 63, temperature 98 F (36.7 C), resp. rate 18, height 5\' 9"  (1.753 m), weight 85.3 kg, SpO2 98 %.Body mass index is 27.76 kg/m.  General Appearance: Fairly Groomed  Eye Contact:  Good  Speech:  Clear and Coherent  Volume:  Normal  Mood:  Euphoric  Affect:  Congruent  Thought Process:  Disorganized, Irrelevant and Descriptions of Associations: Loose  Orientation:  Full (Time, Place, and Person)  Thought Content:  Illogical, Delusions, Obsessions, Paranoid Ideation, Rumination and Tangential  Suicidal Thoughts:  No  Homicidal Thoughts:  No  Memory:  Immediate;   Fair Recent;   Fair Remote;   Fair  Judgement:  Poor  Insight:  Lacking  Psychomotor Activity:  Normal  Concentration:  Concentration: Poor and Attention Span: Poor  Recall:  Poor  Fund of Knowledge:  Poor  Language:  Poor  Akathisia:  No  Handed:  Right  AIMS (if indicated):     Assets:  Communication Skills Desire for Improvement Financial Resources/Insurance Housing Physical Health Resilience Social Support  ADL's:  Intact  Cognition:  WNL  Sleep:  Number of Hours: 8.25     Treatment Plan Summary: Daily contact with patient to assess and evaluate  symptoms and progress in treatment and Medication management   Austin Rhodes is a 68 year old male with a history of bipolar disorder admitted for a manic episode.  #Mood/psychosis -continue Depakote 500 mg BID -Lamictal 200 mg daily -increase Seroquel to 300 mg nightly -Trazodone 100 mg nightly PRN  #HTN -Metoprolol 12.5 mg BID -Crestorl 10 mg daily  #Labs -lipid panel, TSH, A1C -EKG reviewed, sinus tachycardia with QTc 431  #Disposition -discharge to home -follow up with Loletta Parish, MD 11/02/2017, 6:33 PM

## 2017-11-02 NOTE — Plan of Care (Addendum)
Patient found in room awake upon my arrival. Patient is isolative to his room throughout the evening. Patient is neither visible or social this evening. Patient stated mood is "fine" and affect is anxious. Speech is pressured and grandiose. Patient believes he is head of an investigation accusing [employer] of poisoning water and fraudulent billing practices. Believes this story is on [employer]. Patient reports he is "protecting" me and tells me not to worry. Denies SI/HI/AVH. Education provided regarding medications and safety. Patient verbalized understanding but processing is impaired. Reinforcement necessary. Reports eating and voiding adequately. Ate large snack. Denies pain. Compliant with HS medication and staff direction. Given Trazodone PRN for sleep with positive results. Q 15 minute checks maintained. Will continue to monitor throughout the shift. Patient slept 8.25 hours. No apparent distress. Will endorse care to oncoming shift.  Problem: Coping: Goal: Coping ability will improve Outcome: Progressing Goal: Will verbalize feelings Outcome: Progressing   Problem: Coping: Goal: Coping ability will improve Outcome: Progressing Goal: Will verbalize feelings Outcome: Progressing   Problem: Self-Concept: Goal: Level of anxiety will decrease Outcome: Progressing

## 2017-11-02 NOTE — Progress Notes (Addendum)
[facility] MD Progress Note  11/03/2017 2:04 PM Austin Rhodes  MRN:  [id number]  Subjective:   Patient in euphoric mania, sensless, grandiose but very pleasant. Tolerates Seroquel titration well.   Principal Problem: Bipolar I disorder, current or most recent episode manic, with psychotic features (HCC) Diagnosis:   Patient Active Problem List   Diagnosis Date Noted  . Bipolar I disorder, current or most recent episode manic, with psychotic features (HCC) [F31.2] 10/31/2017    Priority: High  . HTN (hypertension) [I10] 11/01/2017  . Chest pain [R07.9] 05/09/2015   Total Time spent with patient: 20 minutes  Past Psychiatric History: bipolar disorder  Past Medical History:  Past Medical History:  Diagnosis Date  . Bipolar disorder (HCC)   . Coronary artery disease   . GERD (gastroesophageal reflux disease)   . History of multiple strokes   . PTSD (post-traumatic stress disorder)     Past Surgical History:  Procedure Laterality Date  . CARDIAC CATHETERIZATION     Family History: History reviewed. No pertinent family history. Family Psychiatric  History: none Social History:  Social History   Substance and Sexual Activity  Alcohol Use No     Social History   Substance and Sexual Activity  Drug Use Not on file    Social History   Socioeconomic History  . Marital status: Legally Separated    Spouse name: Not on file  . Number of children: Not on file  . Years of education: Not on file  . Highest education level: Not on file  Occupational History  . Not on file  Social Needs  . Financial resource strain: Not on file  . Food insecurity:    Worry: Not on file    Inability: Not on file  . Transportation needs:    Medical: Not on file    Non-medical: Not on file  Tobacco Use  . Smoking status: Never Smoker  . Smokeless tobacco: Never Used  Substance and Sexual Activity  . Alcohol use: No  . Drug use: Not on file  . Sexual activity: Not on file  Lifestyle  .  Physical activity:    Days per week: Not on file    Minutes per session: Not on file  . Stress: Not on file  Relationships  . Social connections:    Talks on phone: Not on file    Gets together: Not on file    Attends religious service: Not on file    Active member of club or organization: Not on file    Attends meetings of clubs or organizations: Not on file    Relationship status: Not on file  Other Topics Concern  . Not on file  Social History Narrative  . Not on file   Additional Social History:                         Sleep: Fair  Appetite:  Fair  Current Medications: Current Facility-Administered Medications  Medication Dose Route Frequency Provider Last Rate Last Dose  . acetaminophen (TYLENOL) tablet 650 mg  650 mg Oral Q6H PRN Beverly Sessions, MD      . alum & mag hydroxide-simeth (MAALOX/MYLANTA) 200-200-20 MG/5ML suspension 30 mL  30 mL Oral Q4H PRN Beverly Sessions, MD      . divalproex (DEPAKOTE) DR tablet 500 mg  500 mg Oral BID Beverly Sessions, MD   500 mg at 11/03/17 0826  . hydrOXYzine (ATARAX/VISTARIL) tablet 25 mg  25  mg Oral TID PRN Beverly Sessions, MD      . lamoTRIgine (LAMICTAL) tablet 200 mg  200 mg Oral Daily Beverly Sessions, MD   200 mg at 11/03/17 0826  . magnesium hydroxide (MILK OF MAGNESIA) suspension 30 mL  30 mL Oral Daily PRN Beverly Sessions, MD      . metoprolol succinate (TOPROL-XL) 24 hr tablet 25 mg  25 mg Oral Daily Beverly Sessions, MD   25 mg at 11/03/17 0826  . pneumococcal 23 valent vaccine (PNU-IMMUNE) injection 0.5 mL  0.5 mL Intramuscular Tomorrow-1000 Pucilowska, [name], MD      . QUEtiapine (SEROQUEL) tablet 400 mg  400 mg Oral QHS Pucilowska, [name], MD      . rosuvastatin (CRESTOR) tablet 10 mg  10 mg Oral Daily Beverly Sessions, MD   10 mg at 11/02/17 1716  . traZODone (DESYREL) tablet 50 mg  50 mg Oral QHS PRN Beverly Sessions, MD   50 mg at 11/01/17 2052    Lab Results:  Results for orders  placed or performed during the hospital encounter of 10/31/17 (from the past 48 hour(s))  Lipid panel     Status: None   Collection Time: 11/03/17  7:21 AM  Result Value Ref Range   Cholesterol 133 0 - 200 mg/dL   Triglycerides 161 <096 mg/dL   HDL 44 >04 mg/dL   Total CHOL/HDL Ratio 3.0 RATIO   VLDL 30 0 - 40 mg/dL   LDL Cholesterol 59 0 - 99 mg/dL    Comment:        Total Cholesterol/HDL:CHD Risk Coronary Heart Disease Risk Table                     Men   Women  1/2 Average Risk   3.4   3.3  Average Risk       5.0   4.4  2 X Average Risk   9.6   7.1  3 X Average Risk  23.4   11.0        Use the calculated Patient Ratio above and the CHD Risk Table to determine the patient's CHD Risk.        ATP III CLASSIFICATION (LDL):  <100     mg/dL   Optimal  540-981  mg/dL   Near or Above                    Optimal  130-159  mg/dL   Borderline  191-478  mg/dL   High  >295     mg/dL   Very High Performed at [facility], [address] [place], [place] [address]   TSH     Status: None   Collection Time: 11/03/17  7:21 AM  Result Value Ref Range   TSH 3.757 0.350 - 4.500 uIU/mL    Comment: Performed by a 3rd Generation assay with a functional sensitivity of <=0.01 uIU/mL. Performed at [facility], [address] [place], [place] [address]     Blood Alcohol level:  Lab Results  Component Value Date   [facility] <10 10/29/2017   ETH <10 03/31/2017    Metabolic Disorder Labs: Lab Results  Component Value Date   HGBA1C  01/29/2009    5.9 (NOTE) The ADA recommends the following therapeutic goal for glycemic control related to Hgb A1c measurement: Goal of therapy: <6.5 Hgb A1c  Reference: American Diabetes Association: Clinical Practice Recommendations 2010, Diabetes Care, 2010, 33: (Suppl  1).  MPG 123 01/29/2009   No results found for: PROLACTIN Lab Results  Component Value Date   CHOL 133 11/03/2017   TRIG 148 11/03/2017   HDL 44 11/03/2017   CHOLHDL  3.0 11/03/2017   VLDL 30 11/03/2017   LDLCALC 59 11/03/2017   LDLCALC 39 05/10/2015    Physical Findings: AIMS: Facial and Oral Movements Muscles of Facial Expression: None, normal Lips and Perioral Area: None, normal Jaw: None, normal Tongue: None, normal,Extremity Movements Upper (arms, wrists, hands, fingers): None, normal Lower (legs, knees, ankles, toes): None, normal, Trunk Movements Neck, shoulders, hips: None, normal, Overall Severity Severity of abnormal movements (highest score from questions above): None, normal Incapacitation due to abnormal movements: None, normal Patient's awareness of abnormal movements (rate only patient's report): No Awareness, Dental Status Current problems with teeth and/or dentures?: No Does patient usually wear dentures?: No  CIWA:  CIWA-Ar Total: 2 COWS:  COWS Total Score: 3  Musculoskeletal: Strength & Muscle Tone: within normal limits Gait & Station: normal Patient leans: N/A  Psychiatric Specialty Exam: Physical Exam  Nursing note and vitals reviewed. Psychiatric: His speech is normal and behavior is normal. His affect is labile and inappropriate. Thought content is paranoid and delusional. Cognition and memory are impaired. He expresses impulsivity.    Review of Systems  Neurological: Negative.   Psychiatric/Behavioral: Negative.   All other systems reviewed and are negative.   Blood pressure 130/79, pulse 63, temperature 98 F (36.7 C), resp. rate 18, height 5\' 9"  (1.753 m), weight 85.3 kg, SpO2 98 %.Body mass index is 27.76 kg/m.  General Appearance: Casual  Eye Contact:  Good  Speech:  Clear and Coherent  Volume:  Normal  Mood:  Euphoric  Affect:  Inappropriate  Thought Process:  Disorganized, Irrelevant and Descriptions of Associations: Loose  Orientation:  Full (Time, Place, and Person)  Thought Content:  WDL  Suicidal Thoughts:  No  Homicidal Thoughts:  No  Memory:  Immediate;   Poor Recent;   Poor Remote;   Poor   Judgement:  Impaired  Insight:  Lacking  Psychomotor Activity:  Normal  Concentration:  Concentration: Fair and Attention Span: Fair  Recall:  Poor  Fund of Knowledge:  Poor  Language:  Poor  Akathisia:  No  Handed:  Right  AIMS (if indicated):     Assets:  Communication Skills Desire for Improvement Financial Resources/Insurance Housing Physical Health Resilience Social Support  ADL's:  Intact  Cognition:  WNL  Sleep:  Number of Hours: 7.15     Treatment Plan Summary: Daily contact with patient to assess and evaluate symptoms and progress in treatment and Medication management   Mr. Pinsky is a 67 year old male with a history of bipolar disorder admitted for a manic episode.  #Mood/psychosis -continue Depakote 500 mg BID -Lamictal 200 mg daily -increase Seroquel to 400 mg nightly  -Trazodone 100 mg nightly PRN  #HTN -Metoprolol 12.5 mg BID -Crestorl 10 mg daily  #Labs -lipid panel normal, TSH normal, A1C -EKG reviewed, sinus tachycardia with QTc 431  #Disposition -discharge to home -follow up with Loletta Parish, MD 11/03/2017, 2:04 PM

## 2017-11-02 NOTE — Progress Notes (Signed)
Recreation Therapy Notes  Date: 11/02/2017  Time: 9:30 am  Location: Craft Room  Behavioral response: Appropriate   Intervention Topic: Coping Skills  Discussion/Intervention:  Group content on today was focused on coping skills. The group defined what coping skills are and when they can be used. Individuals described how they normally cope with thing and the coping skills they normally use. Patients expressed why it is important to cope with things and how not coping with things can affect you. The group participated in the intervention "My coping box" and made coping boxes while adding coping skills they could use in the future to the box. Clinical Observations/Feedback:  Patient came to group and stated coping skills are difficult to use when you keep using the same ones. He stated he normally reads as his coping skills. 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 He went on to talking about his friends that work at [employer]. He was redirected several times during group.  Individual was social with peers and staff while participating in the intervention. Participant left early and never returned to group. Rickardo Brinegar LRT/CTRS         Bradan Congrove 11/02/2017 11:25 AM

## 2017-11-03 LAB — LIPID PANEL
CHOLESTEROL: 133 mg/dL (ref 0–200)
HDL: 44 mg/dL (ref >40)
LDL Cholesterol: 59 mg/dL (ref 0–99)
TRIGLYCERIDES: 148 mg/dL (ref <150)
Total CHOL/HDL Ratio: 3 RATIO
VLDL: 30 mg/dL (ref 0–40)

## 2017-11-03 LAB — TSH: TSH: 3.757 u[IU]/mL (ref 0.350–4.500)

## 2017-11-03 MED ORDER — QUETIAPINE FUMARATE 200 MG PO TABS
400.0000 mg | ORAL_TABLET | Freq: Every day | ORAL | Status: DC
  Administered 2017-11-03 – 2017-11-09 (×7): 400 mg via ORAL
  Filled 2017-11-03 (×7): qty 2

## 2017-11-03 NOTE — [facility] Group Notes (Addendum)
11/02/2017 1PM  Type of Therapy/Topic:  Group Therapy:  Feelings about Diagnosis  Participation Level:  Active   Description of Group:   This group will allow patients to explore their thoughts and feelings about diagnoses they have received. Patients will be guided to explore their level of understanding and acceptance of these diagnoses. Facilitator will encourage patients to process their thoughts and feelings about the reactions of others to their diagnosis and will guide patients in identifying ways to discuss their diagnosis with significant others in their lives. This group will be process-oriented, with patients participating in exploration of their own experiences, giving and receiving support, and processing challenge from other group members.   Therapeutic Goals: 1. Patient will demonstrate understanding of diagnosis as evidenced by identifying two or more symptoms of the disorder 2. Patient will be able to express two feelings regarding the diagnosis 3. Patient will demonstrate their ability to communicate their needs through discussion and/or role play  Summary of Patient Progress: Actively and appropriately engaged in the group. Patient practiced active listening when interacting with the facilitator. Patient is still in the process of obtaining treatment goals.        Therapeutic Modalities:   Cognitive Behavioral Therapy Brief Therapy Feelings Identification    Kristine Linea, MD 11/03/2017 2:03 PM

## 2017-11-03 NOTE — Progress Notes (Addendum)
[facility] MD Progress Note  11/04/2017 2:24 PM KNOAH NEDEAU  MRN:  [id number]  Subjective:    Mr. Klosinski is still paranoid, delusional and grandiose. He is very disorganized in his thinking still. He has been hiding in his room most of the time and believes that he suffers "whistleblower syndrome". He feels involved in water pollution scandal at [employer] and is afraid to be prosecuted for it. He has been stable for years but in August 29, 2022, his mother passed away and the patient deteriorated rapidly.   Principal Problem: Bipolar I disorder, current or most recent episode manic, with psychotic features (HCC) Diagnosis:   Patient Active Problem List   Diagnosis Date Noted  . Bipolar I disorder, current or most recent episode manic, with psychotic features (HCC) [F31.2] 10/31/2017    Priority: High  . HTN (hypertension) [I10] 11/01/2017  . Chest pain [R07.9] 05/09/2015   Total Time spent with patient: 20 minutes  Past Psychiatric History: bipolar disorder  Past Medical History:  Past Medical History:  Diagnosis Date  . Bipolar disorder (HCC)   . Coronary artery disease   . GERD (gastroesophageal reflux disease)   . History of multiple strokes   . PTSD (post-traumatic stress disorder)     Past Surgical History:  Procedure Laterality Date  . CARDIAC CATHETERIZATION     Family History: History reviewed. No pertinent family history. Family Psychiatric  History: none Social History:  Social History   Substance and Sexual Activity  Alcohol Use No     Social History   Substance and Sexual Activity  Drug Use Not on file    Social History   Socioeconomic History  . Marital status: Legally Separated    Spouse name: Not on file  . Number of children: Not on file  . Years of education: Not on file  . Highest education level: Not on file  Occupational History  . Not on file  Social Needs  . Financial resource strain: Not on file  . Food insecurity:    Worry: Not on file     Inability: Not on file  . Transportation needs:    Medical: Not on file    Non-medical: Not on file  Tobacco Use  . Smoking status: Never Smoker  . Smokeless tobacco: Never Used  Substance and Sexual Activity  . Alcohol use: No  . Drug use: Not on file  . Sexual activity: Not on file  Lifestyle  . Physical activity:    Days per week: Not on file    Minutes per session: Not on file  . Stress: Not on file  Relationships  . Social connections:    Talks on phone: Not on file    Gets together: Not on file    Attends religious service: Not on file    Active member of club or organization: Not on file    Attends meetings of clubs or organizations: Not on file    Relationship status: Not on file  Other Topics Concern  . Not on file  Social History Narrative  . Not on file   Additional Social History:                         Sleep: Fair  Appetite:  Fair  Current Medications: Current Facility-Administered Medications  Medication Dose Route Frequency Provider Last Rate Last Dose  . acetaminophen (TYLENOL) tablet 650 mg  650 mg Oral Q6H PRN Beverly Sessions, MD      .  alum & mag hydroxide-simeth (MAALOX/MYLANTA) 200-200-20 MG/5ML suspension 30 mL  30 mL Oral Q4H PRN Beverly Sessions, MD      . divalproex (DEPAKOTE) DR tablet 500 mg  500 mg Oral BID Beverly Sessions, MD   500 mg at 11/04/17 0910  . hydrOXYzine (ATARAX/VISTARIL) tablet 25 mg  25 mg Oral TID PRN Beverly Sessions, MD      . lamoTRIgine (LAMICTAL) tablet 200 mg  200 mg Oral Daily Beverly Sessions, MD   200 mg at 11/04/17 0910  . magnesium hydroxide (MILK OF MAGNESIA) suspension 30 mL  30 mL Oral Daily PRN Beverly Sessions, MD      . metoprolol succinate (TOPROL-XL) 24 hr tablet 25 mg  25 mg Oral Daily Beverly Sessions, MD   25 mg at 11/04/17 0910  . pneumococcal 23 valent vaccine (PNU-IMMUNE) injection 0.5 mL  0.5 mL Intramuscular Tomorrow-1000 Pucilowska, [name], MD      . QUEtiapine (SEROQUEL)  tablet 400 mg  400 mg Oral QHS Pucilowska, [name], MD   400 mg at 11/03/17 2106  . rosuvastatin (CRESTOR) tablet 10 mg  10 mg Oral Daily Beverly Sessions, MD   10 mg at 11/03/17 1705  . traZODone (DESYREL) tablet 50 mg  50 mg Oral QHS PRN Beverly Sessions, MD   50 mg at 11/01/17 2052    Lab Results:  Results for orders placed or performed during the hospital encounter of 10/31/17 (from the past 48 hour(s))  Lipid panel     Status: None   Collection Time: 11/03/17  7:21 AM  Result Value Ref Range   Cholesterol 133 0 - 200 mg/dL   Triglycerides 161 <096 mg/dL   HDL 44 >04 mg/dL   Total CHOL/HDL Ratio 3.0 RATIO   VLDL 30 0 - 40 mg/dL   LDL Cholesterol 59 0 - 99 mg/dL    Comment:        Total Cholesterol/HDL:CHD Risk Coronary Heart Disease Risk Table                     Men   Women  1/2 Average Risk   3.4   3.3  Average Risk       5.0   4.4  2 X Average Risk   9.6   7.1  3 X Average Risk  23.4   11.0        Use the calculated Patient Ratio above and the CHD Risk Table to determine the patient's CHD Risk.        ATP III CLASSIFICATION (LDL):  <100     mg/dL   Optimal  540-981  mg/dL   Near or Above                    Optimal  130-159  mg/dL   Borderline  191-478  mg/dL   High  >295     mg/dL   Very High Performed at [facility], [address] [place], [place] [address]   TSH     Status: None   Collection Time: 11/03/17  7:21 AM  Result Value Ref Range   TSH 3.757 0.350 - 4.500 uIU/mL    Comment: Performed by a 3rd Generation assay with a functional sensitivity of <=0.01 uIU/mL. Performed at [facility], [address] [place], [place] [address]   Hemoglobin A1c     Status: None   Collection Time: 11/03/17  7:21 AM  Result Value Ref Range   Hgb  A1c MFr Bld 5.5 4.8 - 5.6 %    Comment: (NOTE)         Prediabetes: 5.7 - 6.4         Diabetes: >6.4         Glycemic control for adults with diabetes: <7.0    Mean Plasma Glucose 111 mg/dL     Comment: (NOTE) Performed At: [facility] [address] [place], [place] [id number] Jolene Schimke MD XB:[id number]   Valproic acid level     Status: None   Collection Time: 11/04/17  6:27 AM  Result Value Ref Range   Valproic Acid Lvl 52 50.0 - 100.0 ug/mL    Comment: Performed at [facility], [address] [place], [place] [address]  Ammonia     Status: None   Collection Time: 11/04/17  6:27 AM  Result Value Ref Range   Ammonia 33 9 - 35 umol/L    Comment: Performed at [facility], [address] [place], [place] [address]    Blood Alcohol level:  Lab Results  Component Value Date   [facility] <10 10/29/2017   ETH <10 03/31/2017    Metabolic Disorder Labs: Lab Results  Component Value Date   HGBA1C 5.5 11/03/2017   MPG 111 11/03/2017   MPG 123 01/29/2009   No results found for: PROLACTIN Lab Results  Component Value Date   CHOL 133 11/03/2017   TRIG 148 11/03/2017   HDL 44 11/03/2017   CHOLHDL 3.0 11/03/2017   VLDL 30 11/03/2017   LDLCALC 59 11/03/2017   LDLCALC 39 05/10/2015    Physical Findings: AIMS: Facial and Oral Movements Muscles of Facial Expression: None, normal Lips and Perioral Area: None, normal Jaw: None, normal Tongue: None, normal,Extremity Movements Upper (arms, wrists, hands, fingers): None, normal Lower (legs, knees, ankles, toes): None, normal, Trunk Movements Neck, shoulders, hips: None, normal, Overall Severity Severity of abnormal movements (highest score from questions above): None, normal Incapacitation due to abnormal movements: None, normal Patient's awareness of abnormal movements (rate only patient's report): No Awareness, Dental Status Current problems with teeth and/or dentures?: No Does patient usually wear dentures?: No  CIWA:  CIWA-Ar Total: 2 COWS:  COWS Total Score: 3  Musculoskeletal: Strength & Muscle Tone: within normal limits Gait & Station: normal Patient leans: N/A  Psychiatric  Specialty Exam: Physical Exam  Nursing note and vitals reviewed. Psychiatric: His affect is labile and inappropriate. His speech is rapid and/or pressured and tangential. Thought content is paranoid and delusional. Cognition and memory are impaired. He expresses impulsivity.    Review of Systems  Neurological: Negative.   Psychiatric/Behavioral: Negative.   All other systems reviewed and are negative.   Blood pressure (!) 143/71, pulse 73, temperature 97.9 F (36.6 C), temperature source Oral, resp. rate 18, height 5\' 9"  (1.753 m), weight 85.3 kg, SpO2 98 %.Body mass index is 27.76 kg/m.  General Appearance: Fairly Groomed  Eye Contact:  Good  Speech:  Clear and Coherent  Volume:  Increased  Mood:  Anxious  Affect:  Labile  Thought Process:  Disorganized, Irrelevant and Descriptions of Associations: Loose  Orientation:  Full (Time, Place, and Person)  Thought Content:  Illogical, Delusions, Obsessions and Paranoid Ideation  Suicidal Thoughts:  No  Homicidal Thoughts:  No  Memory:  Immediate;   Fair Recent;   Fair Remote;   Fair  Judgement:  Impaired  Insight:  Lacking  Psychomotor Activity:  Increased  Concentration:  Concentration: Poor and Attention Span: Poor  Recall:  Poor  Fund of Knowledge:  Poor  Language:  Poor  Akathisia:  No  Handed:  Right  AIMS (if indicated):     Assets:  Communication Skills Desire for Improvement Financial Resources/Insurance Housing Physical Health Resilience Social Support  ADL's:  Intact  Cognition:  WNL  Sleep:  Number of Hours: 8     Treatment Plan Summary: Daily contact with patient to assess and evaluate symptoms and progress in treatment and Medication management   Mr. Voorheis is a 67 year old male with a history of bipolar disorder admitted for a manic episode.  #Mood/psychosis -continue Depakote 500 mg BID, VPA level 52 and ammonia 33 on 10/3 -Lamictal 200 mg daily -increaseSeroquelto 400 mg nightly  -Trazodone  100 mg nightly PRN  #HTN -Metoprolol 12.5 mg BID -Crestor 10 mg daily  #Labs -lipid panel normal, TSH normal, A1C -EKGreviewed, sinus tachycardia with QTc 431  #Disposition -discharge to home -follow up with Loletta Parish, MD 11/04/2017, 2:24 PM

## 2017-11-03 NOTE — Progress Notes (Signed)
Recreation Therapy Notes  Date: 11/03/17  Time: 9:30 am   Location: Craft room   Behavioral response: N/A   Intervention Topic: Self-care   Discussion/Intervention: Patient did not attend group.   Clinical Observations/Feedback:  Patient did not attend group.        Tane Biegler 11/03/2017 9:56 AM 

## 2017-11-03 NOTE — Progress Notes (Signed)
Received Austin Rhodes this AM after breakfast, he was compliant with his medications. He continues deny all psychiatric symptoms. He is OOB in the milieu at intervals throughout the day. He asked if a lawyer was available for him to talk with related to whistle blowing. He would not elaborate further on the issue.

## 2017-11-03 NOTE — [facility] Group Notes (Signed)

## 2017-11-04 LAB — AMMONIA: AMMONIA: 33 umol/L (ref 9–35)

## 2017-11-04 LAB — HEMOGLOBIN A1C
HEMOGLOBIN A1C: 5.5 % (ref 4.8–5.6)
Mean Plasma Glucose: 111 mg/dL

## 2017-11-04 LAB — VALPROIC ACID LEVEL: Valproic Acid Lvl: 52 ug/mL (ref 50.0–100.0)

## 2017-11-04 NOTE — [facility] Suicide Risk Assessment (Signed)
[facility] INPATIENT:  Family/Significant Other Suicide Prevention Education  Suicide Prevention Education:  Education Completed; Juriel Cid (daughter at [phone]) has been identified as the person who will aid the patient in the event of a mental health crisis (suicidal ideations/suicide attempt).  With written consent from the patient, the family member has been provided the following suicide prevention education, prior to the discharge of the patient.  The suicide prevention education provided includes the following:  Suicide risk factors  Suicide prevention and interventions  National Suicide Hotline telephone number  [facility] assessment telephone number  [facility] Emergency Assistance 911  [facility] and/or Residential Mobile Crisis Unit telephone number  Request made of family/significant other to:  Remove weapons (e.g., guns, rifles, knives), all items previously/currently identified as safety concern.    Remove drugs/medications (over-the-counter, prescriptions, illicit drugs), all items previously/currently identified as a safety concern.  The family member/significant other verbalizes understanding of the suicide prevention education information provided.   Alease Frame, LCSW 11/04/2017, 4:29 PM

## 2017-11-04 NOTE — Progress Notes (Signed)
Recreation Therapy Notes Date: 11/04/2017  Time: 9:30 am  Location: Craft Room  Behavioral response: Appropriate, redirected  Intervention Topic: Problem Solving  Discussion/Intervention:  Group content on today was focused on problem solving. The group described what problem solving is. Patients expressed how problems affect them and how they deal with problems. Individuals identified healthy ways to deal with problems. Patients explained what normally happens to them when they do not deal with problems. The group expressed reoccurring problems for them. The group participated in the intervention "Ways to Solve problems" where patients were given a chance to explore different ways to solve problems.  Clinical Observations/Feedback:  Patient came to group late due to unknown reasons. He became agitated in group when his answers were not correct while participating in the activity. 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  Patient was told by group facilitator to focus on why we were participating in the intervention instead of right and wrong answers. Participant eventually calmed down and continued to be social with peers and staff during group.  Miranda Garber LRT/CTRS       Shahil Speegle 11/04/2017 11:23 AM

## 2017-11-04 NOTE — Plan of Care (Signed)
Patient is interacting more with peers and participating in social activities, compliant with medical regimen, denies any SI/HI, still grandiose when talking to him, sleep long hours with out interactions, no complains voiced by patient at this time , no distress noted.   Problem: Activity: Goal: Will verbalize the importance of balancing activity with adequate rest periods Outcome: Progressing   Problem: Coping: Goal: Coping ability will improve Outcome: Progressing Goal: Will verbalize feelings Outcome: Progressing   Problem: Coping: Goal: Coping ability will improve Outcome: Progressing Goal: Will verbalize feelings Outcome: Progressing   Problem: Safety: Goal: Ability to disclose and discuss suicidal ideas will improve Outcome: Progressing Goal: Ability to identify and utilize support systems that promote safety will improve Outcome: Progressing   Problem: Education: Goal: Ability to state activities that reduce stress will improve Outcome: Progressing   Problem: Self-Concept: Goal: Ability to identify factors that promote anxiety will improve Outcome: Progressing Goal: Level of anxiety will decrease Outcome: Progressing Goal: Ability to modify response to factors that promote anxiety will improve Outcome: Progressing   Problem: Education: Goal: Knowledge of General Education information will improve Description Including pain rating scale, medication(s)/side effects and non-pharmacologic comfort measures Outcome: Progressing   Problem: Health Behavior/Discharge Planning: Goal: Ability to manage health-related needs will improve Outcome: Progressing   Problem: Clinical Measurements: Goal: Ability to maintain clinical measurements within normal limits will improve Outcome: Progressing Goal: Will remain free from infection Outcome: Progressing Goal: Diagnostic test results will improve Outcome: Progressing Goal: Respiratory complications will improve Outcome:  Progressing Goal: Cardiovascular complication will be avoided Outcome: Progressing   Problem: Activity: Goal: Risk for activity intolerance will decrease Outcome: Progressing   Problem: Nutrition: Goal: Adequate nutrition will be maintained Outcome: Progressing   Problem: Coping: Goal: Level of anxiety will decrease Outcome: Progressing   Problem: Elimination: Goal: Will not experience complications related to bowel motility Outcome: Progressing Goal: Will not experience complications related to urinary retention Outcome: Progressing   Problem: Pain Managment: Goal: General experience of comfort will improve Outcome: Progressing   Problem: Safety: Goal: Ability to remain free from injury will improve Outcome: Progressing   Problem: Skin Integrity: Goal: Risk for impaired skin integrity will decrease Outcome: Progressing

## 2017-11-04 NOTE — Progress Notes (Addendum)
Received Austin Rhodes this AM after breakfast,  He was compliant with his medications. He continues to deny all of the psychiatric symptoms. He is OOB in the milieu at intervals and napping throughout the morning. He had a visit from his daughter this PM and was trying to explain to her the survey questions from  the group therapy session was upsetting. This Clinical research associate gave the daughter the workbook for Thursday, hoping it was the correct item.

## 2017-11-04 NOTE — Progress Notes (Signed)
[facility] MD Progress Note  11/05/2017 1:10 PM KEELIN SHERIDAN  MRN:  [id number]  Subjective:    Mr. Magri has a history of bipolar disorder destabilized by his mother's recent death in 27-Aug-2022. Admitted manic, grandiose, delusional. Calms him down when he is listened to.   The patient is in his room this morning even though he was doing better yesterday. He fely there was too much stimulation during morning classes due to the presence of nursing students on the unit. He had a panic attack. He is also very frightened and overwhelmed with the thought that he may face legal charges. 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 I do not believe there are legal charges. He seems to tolerate medications well. No somatic complaints. Sleep is better. His hygiene still poor.    Principal Problem: Bipolar I disorder, current or most recent episode manic, with psychotic features (HCC) Diagnosis:   Patient Active Problem List   Diagnosis Date Noted  . Bipolar I disorder, current or most recent episode manic, with psychotic features (HCC) [F31.2] 10/31/2017    Priority: High  . HTN (hypertension) [I10] 11/01/2017  . Chest pain [R07.9] 05/09/2015   Total Time spent with patient: 20 minutes  Past Psychiatric History: bipolar disorder  Past Medical History:  Past Medical History:  Diagnosis Date  . Bipolar disorder (HCC)   . Coronary artery disease   . GERD (gastroesophageal reflux disease)   . History of multiple strokes   . PTSD (post-traumatic stress disorder)     Past Surgical History:  Procedure Laterality Date  . CARDIAC CATHETERIZATION     Family History: History reviewed. No pertinent family history. Family Psychiatric  History: none Social History:  Social History   Substance and Sexual Activity  Alcohol Use No     Social History   Substance and Sexual Activity  Drug Use Not on file    Social History   Socioeconomic History  . Marital status: Legally  Separated    Spouse name: Not on file  . Number of children: Not on file  . Years of education: Not on file  . Highest education level: Not on file  Occupational History  . Not on file  Social Needs  . Financial resource strain: Not on file  . Food insecurity:    Worry: Not on file    Inability: Not on file  . Transportation needs:    Medical: Not on file    Non-medical: Not on file  Tobacco Use  . Smoking status: Never Smoker  . Smokeless tobacco: Never Used  Substance and Sexual Activity  . Alcohol use: No  . Drug use: Not on file  . Sexual activity: Not on file  Lifestyle  . Physical activity:    Days per week: Not on file    Minutes per session: Not on file  . Stress: Not on file  Relationships  . Social connections:    Talks on phone: Not on file    Gets together: Not on file    Attends religious service: Not on file    Active member of club or organization: Not on file    Attends meetings of clubs or organizations: Not on file    Relationship status: Not on file  Other Topics Concern  . Not on file  Social History Narrative  . Not on file   Additional Social History:  Sleep: Fair  Appetite:  Fair  Current Medications: Current Facility-Administered Medications  Medication Dose Route Frequency Provider Last Rate Last Dose  . acetaminophen (TYLENOL) tablet 650 mg  650 mg Oral Q6H PRN Beverly Sessions, MD      . alum & mag hydroxide-simeth (MAALOX/MYLANTA) 200-200-20 MG/5ML suspension 30 mL  30 mL Oral Q4H PRN Beverly Sessions, MD      . divalproex (DEPAKOTE) DR tablet 500 mg  500 mg Oral BID Beverly Sessions, MD   500 mg at 11/05/17 0853  . hydrOXYzine (ATARAX/VISTARIL) tablet 25 mg  25 mg Oral TID PRN Beverly Sessions, MD      . lamoTRIgine (LAMICTAL) tablet 200 mg  200 mg Oral Daily Beverly Sessions, MD   200 mg at 11/05/17 0853  . magnesium hydroxide (MILK OF MAGNESIA) suspension 30 mL  30 mL Oral Daily PRN Beverly Sessions, MD      . metoprolol succinate (TOPROL-XL) 24 hr tablet 25 mg  25 mg Oral Daily Beverly Sessions, MD   25 mg at 11/05/17 0853  . pneumococcal 23 valent vaccine (PNU-IMMUNE) injection 0.5 mL  0.5 mL Intramuscular Tomorrow-1000 Pucilowska, [name], MD      . QUEtiapine (SEROQUEL) tablet 400 mg  400 mg Oral QHS Pucilowska, [name], MD   400 mg at 11/04/17 2210  . rosuvastatin (CRESTOR) tablet 10 mg  10 mg Oral Daily Beverly Sessions, MD   10 mg at 11/04/17 1825  . traZODone (DESYREL) tablet 50 mg  50 mg Oral QHS PRN Beverly Sessions, MD   50 mg at 11/01/17 2052    Lab Results:  Results for orders placed or performed during the hospital encounter of 10/31/17 (from the past 48 hour(s))  Valproic acid level     Status: None   Collection Time: 11/04/17  6:27 AM  Result Value Ref Range   Valproic Acid Lvl 52 50.0 - 100.0 ug/mL    Comment: Performed at [facility], [address] [place], [place] [address]  Ammonia     Status: None   Collection Time: 11/04/17  6:27 AM  Result Value Ref Range   Ammonia 33 9 - 35 umol/L    Comment: Performed at [facility], [address] [place], [place] [address]    Blood Alcohol level:  Lab Results  Component Value Date   [facility] <10 10/29/2017   ETH <10 03/31/2017    Metabolic Disorder Labs: Lab Results  Component Value Date   HGBA1C 5.5 11/03/2017   MPG 111 11/03/2017   MPG 123 01/29/2009   No results found for: PROLACTIN Lab Results  Component Value Date   CHOL 133 11/03/2017   TRIG 148 11/03/2017   HDL 44 11/03/2017   CHOLHDL 3.0 11/03/2017   VLDL 30 11/03/2017   LDLCALC 59 11/03/2017   LDLCALC 39 05/10/2015    Physical Findings: AIMS: Facial and Oral Movements Muscles of Facial Expression: None, normal Lips and Perioral Area: None, normal Jaw: None, normal Tongue: None, normal,Extremity Movements Upper (arms, wrists, hands, fingers): None, normal Lower (legs, knees, ankles, toes): None, normal,  Trunk Movements Neck, shoulders, hips: None, normal, Overall Severity Severity of abnormal movements (highest score from questions above): None, normal Incapacitation due to abnormal movements: None, normal Patient's awareness of abnormal movements (rate only patient's report): No Awareness, Dental Status Current problems with teeth and/or dentures?: No Does patient usually wear dentures?: No  CIWA:  CIWA-Ar Total: 2 COWS:  COWS Total Score: 3  Musculoskeletal: Strength &  Muscle Tone: within normal limits Gait & Station: normal Patient leans: N/A  Psychiatric Specialty Exam: Physical Exam  Nursing note and vitals reviewed. Psychiatric: His speech is normal. His affect is labile and inappropriate. He is withdrawn. Thought content is paranoid and delusional. Cognition and memory are impaired. He expresses impulsivity.    Review of Systems  Neurological: Negative.   Psychiatric/Behavioral: Negative.   All other systems reviewed and are negative.   Blood pressure 130/81, pulse 64, temperature 98.4 F (36.9 C), temperature source Oral, resp. rate 16, height 5\' 9"  (1.753 m), weight 85.3 kg, SpO2 97 %.Body mass index is 27.76 kg/m.  General Appearance: Fairly Groomed  Eye Contact:  Good  Speech:  Clear and Coherent  Volume:  Normal  Mood:  Euphoric  Affect:  Inappropriate  Thought Process:  Disorganized, Irrelevant and Descriptions of Associations: Loose  Orientation:  Full (Time, Place, and Person)  Thought Content:  Delusions and Paranoid Ideation  Suicidal Thoughts:  No  Homicidal Thoughts:  No  Memory:  Immediate;   Poor Recent;   Poor Remote;   Poor  Judgement:  Poor  Insight:  Lacking  Psychomotor Activity:  Decreased  Concentration:  Concentration: Poor and Attention Span: Poor  Recall:  Poor  Fund of Knowledge:  Poor  Language:  Poor  Akathisia:  No  Handed:  Right  AIMS (if indicated):     Assets:  Communication Skills Desire for Improvement Financial  Resources/Insurance Housing Physical Health Resilience Social Support  ADL's:  Intact  Cognition:  WNL  Sleep:  Number of Hours: 6     Treatment Plan Summary: Daily contact with patient to assess and evaluate symptoms and progress in treatment and Medication management   Mr. Chismar is a 67 year old male with a history of bipolar disorder admitted for a manic episode. Came very paranoid and disorganized. Improving.  #Mood/psychosis, improving -continue Depakote 500 mg BID, VPA level 52 and ammonia 33 on 10/3 -change Lamictal 200 mg to night time  -increaseSeroquelto400 mg nightly -Trazodone 100 mg nightly PRN  #HTN -Metoprolol 12.5 mg BID -Crestor 10 mg daily  #Labs -lipid panelnormal, TSHnormal, A1C -EKGreviewed, sinus tachycardia with QTc 431  #Disposition -discharge to home -follow up with Loletta Parish, MD 11/05/2017, 1:10 PM

## 2017-11-04 NOTE — Progress Notes (Signed)
Cedar was in the dayroom at the beginning of this shift. Calm, pleasant  and cooperative. Denying hallucinations. Denying thoughts of self harm. Interacting with staff and peers appropriately. Attended evening activities. Had a snack and received bedtime medications as scheduled. Went to bed and currently resting. No sign of discomfort noted. Safety maintained at Q 87mAdvanced Vision Surgery CenterMiBri75antNardByrd [phone] Hospital SoutLCharlsie MerCreoSelect Specialty Hospital - Winston SalMuseum/[phone] B81ShubMuseum/[phone] Endoscopy CeMiBri25antNardByrd [phone] Surgical LCharlsie MerSeneAscension Sacred Heart Rehab InMuseum/[phone] B43KeMuseum/[phone] Samaritan MemorialMiBri60antNardByrd [phone] MerOllCentennial Surgery Center Museum/[phone]. Helena Parish HospMiBri68antNardByrd [phone] MerPinetop Country ClDriscoll Children'S HospitMuseum/galLochmoor Waterway [phone] CeMiBri68antNardByrd [phone] Hospital Of NorthLCharlsie MerClarksvilMental Health Insitute HospitMuseum/[phone] Surgery CeMiBri4antNardByrd [phone] MemorialLCharlsie MerEl Morro VallOsf Saint Luke Medical CentMuseum/[phone] Scott & White Medical Center At WaxahaMiBri45antNardByrd [phone] And LasLCharlsie MerSecurity-WidefieBeauregard Memorial HospitMuseum/[phone] CaMiBri28antNardByrd [phone] [place] HLCharlsie MerWestminstStarr Regional Medical CentMuseum/[phone] Maryvale CaMiBri5antNardByrd [phone] -LCharlsie MerNew SalFremont Medical CentMuseum/[phone] West Regional HospMiBri30antNardByrd [phone] MediLCharlsie MerMagnolThe Endoscopy Center Of West Central [place] LMuseum/galC208-85978m1Laredo Rehabilitation HospMiBri42antNardByrd [phone] Medical CLCharlsie MerMerteMedical Center EnterpriMuseum/galIndepe503 30093m5[facility] CartMiBri43antNardByrd [phone] RalLCharlsie MerMeElliot Hospital City Of ManchestMuseum/[phone] Surgery CenterMiBri76antNardByrd [phone] MerMapleviRoc Surgery LMuseum/galDobbins [phone] Beach Eye CenteMiBri19antNardByrd [phone] MLCharlsie MerHuntsvilConnecticut Orthopaedic Specialists Outpatient Surgical Center LMuseum/[phone] HospMiBri63antNardByrd [phone] MLCharlsie MerPort EwLoma Linda Va Medical CentMuseum/galLittle [phone] Memorial HospMiBri27antNardByrd [phone] MemorLCharlsie MerMarysvilAscension Seton Smithville Regional HospitMuseum/galHidden [phone] TomMiBri31antNardByrd [phone] CountLCharlsie MerCincinnaMidwest Orthopedic Specialty Hospital LMuseum/galWest [phone] [facility] Health ServMiBri29antNardByrd [phone] EndosLCharlsie MerOakfieBon Secours Health Center At Harbour ViMuseum/[phone] Street HospMiBri94antNardByrd [phone] OakLCharlsie MerCharlotte PaGrande Ronde HospitMuseum/[phone] Hermann Surgery Center PinecMiBri25antNardByrd [phone] Surgery Center Of NLCharlsie MerHurBerks Urologic Surgery CentMuseum/[phone] HospMiBri55antNardByrd [phone] Clinic Hlth System- FrancisLCharlsie MerNekoDesert Regional Medical CentMuseum/galNor512 22866m8Digest[facility] HMiBri2antNardByrd [phone] Plains Ambulatory SurgerLCharlsie MerFerry PaEuclid HospitMuseum/galAirport H302-52749m7Regency Hospital Of Fort WMiBri20antNardByrd [phone] EnLCharlsie MerPinehurVancouver Eye Care Museum/galCave [phone] Endoscopy SuitesMiBri23ant4NardByrd HesselbLas Palmas MedLCharlsie MerNoxapatLiberty Medical CentMuseum/[phone] Saint Vincent'S Medical Center RiverMiBri77antNardByrd [phone] Harbor Community HosLCharlsie MerHarpers FerDekalb Regional Medical CentMuseum/[phone] Surgery CeMiBri31antNardByrd [phone] Diest Medical CentCarLCharlsie MerPea RidMallard Creek Surgery CentMuseum/galCollings347 38157m4Rex Surgery Center Of CaryMiBri40antNardByrd [phone] EndLCharlsie MerVadiBehavioral Health HospitMuseum/[phone] M[facility] Logan HospMiBri62antNardByrd [phone] CLCharlsie MerRichboEncompass Health Rehabilitation Hospital Of Desert CanyMuseum/[phone] De[facility] Medical CeMiBri45antNardByrd [phone] BehaLCharlsie MerHannahs MiPrevost Memorial HospitMuseum/galL443-36230m6Copper Queen Douglas Emergency DepartMiBri33antNardByrd [phone] MeLCharlsie MerHaveloMorton Plant HospitMuseum/[phone] Group LLC Dba Syosset SurgicenMiBri53antNardByrd [phone] Surgery Center LLC Dba [place] VLCharlsie MerForest PaSelect Specialty Hospital Central [place] Camp HiMuseum/[phone] [place] University HospiMiBri50antNardByrd [phone] MedicalLCharlsie MerGarfieHeartland Regional Medical CentMuseum/[phone] Health MerMiBri11antNardByrd [phone] MemoriaLCharlsie MerTompkinsvilIdaho State Hospital NorMuseum/galCold321-72246m8Encompass Health Rehabilitation Hospital The VinMiBri57antNardByrd [phone] Surgery LCharlsie MerButUintah Basin Medical CentMuseum/[phone] Medical Center-New HamMiBri72antNardByrd [phone] MerDouglMackinaw Surgery Center LMuseum/[phone] Surgery CenterMiBriant429NardByrd Hesselbac77LCharlsie MerlHuntington V A Medical CenteFruitridge 34mOSouthwest Endoscopy CeMiBri37antNardByrd [phone] LLC Dba The Endoscopy Center For DiLCharlsie MerChoudraHutchinson Area Health CaMuseum/galFl787-42851m3M[facility] Hospital And ClMiBri71antNardByrd [phone] Institute SurgLCharlsie MerChicago HeighNortheast Ohio Surgery Center LMuseum/galS412-29451m1Hurst Ambulatory Surgery Center LLC Dba Precinct Ambulatory Surgery CenterMiBri64antNardByrd [phone] Ridge View Endoscopy CenLCharlsie MerPlymouth MeetiCentral Valley Surgical CentMuseum/galFour [phone]way CaMiBri59antNardByrd [phone] SLCharlsie MerButtevilInterstate Ambulatory Surgery CentMuseum/[phone] Rocksatorn B226m 

## 2017-11-04 NOTE — BHH Group Notes (Signed)
[facility] Group Notes:  (Nursing/MHT/Case Management/Adjunct)  Date:  11/04/2017  Time:  2:11 AM  Type of Therapy:  Group Therapy  Participation Level:  Active  Participation Quality:  Appropriate  Affect:  Appropriate  Cognitive:  Alert  Insight:  Good  Engagement in Group:  Engaged  Modes of Intervention:  Support  Summary of Progress/Problems:  Austin Rhodes 11/04/2017, 2:11 AM

## 2017-11-04 NOTE — Progress Notes (Signed)
Patient ID: Austin Rhodes, male   DOB: 02-01-1951, 68 y.o.   MRN: [id number] PER STATE REGULATIONS 482.30  THIS CHART WAS REVIEWED FOR MEDICAL NECESSITY WITH RESPECT TO THE PATIENT'S ADMISSION/ DURATION OF STAY.  NEXT REVIEW DATE: 11/08/2017  Willa Rough, RN, BSN CASE MANAGER

## 2017-11-04 NOTE — BHH Group Notes (Signed)
LCSW Group Therapy Note  11/04/2017 1:15pm  Type of Therapy/Topic:  Group Therapy:  Balance in Life  Participation Level:  Did Not Attend  Description of Group:    This group will address the concept of balance and how it feels and looks when one is unbalanced. Patients will be encouraged to process areas in their lives that are out of balance and identify reasons for remaining unbalanced. Facilitators will guide patients in utilizing problem-solving interventions to address and correct the stressor making their life unbalanced. Understanding and applying boundaries will be explored and addressed for obtaining and maintaining a balanced life. Patients will be encouraged to explore ways to assertively make their unbalanced needs known to significant others in their lives, using other group members and facilitator for support and feedback.  Therapeutic Goals: 1. Patient will identify two or more emotions or situations they have that consume much of in their lives. 2. Patient will identify signs/triggers that life has become out of balance:  3. Patient will identify two ways to set boundaries in order to achieve balance in their lives:  4. Patient will demonstrate ability to communicate their needs through discussion and/or role plays  Summary of Patient Progress:      Therapeutic Modalities:   Cognitive Behavioral Therapy Solution-Focused Therapy Assertiveness Training  Cheron Schaumann, [place] 11/04/2017 3:17 PM

## 2017-11-04 NOTE — Plan of Care (Signed)
More visible in the milieu, active and cooperative. Thought process improved

## 2017-11-05 MED ORDER — LAMOTRIGINE 100 MG PO TABS
200.0000 mg | ORAL_TABLET | Freq: Every day | ORAL | Status: DC
  Administered 2017-11-06 – 2017-11-09 (×4): 200 mg via ORAL
  Filled 2017-11-05 (×4): qty 2

## 2017-11-05 NOTE — BHH Group Notes (Signed)
  LCSW Group Therapy Note  11/05/2017 1:15pm  Type of Therapy and Topic:  Group Therapy:  Feelings around Relapse and Recovery  Participation Level:  Active/ Needed several redirection interventions   Description of Group:    Patients in this group will discuss emotions they experience before and after a relapse. They will process how experiencing these feelings, or avoidance of experiencing them, relates to having a relapse. Facilitator will guide patients to explore emotions they have related to recovery. Patients will be encouraged to process which emotions are more powerful. They will be guided to discuss the emotional reaction significant others in their lives may have to their relapse or recovery. Patients will be assisted in exploring ways to respond to the emotions of others without this contributing to a relapse.  Therapeutic Goals: 1. Patient will identify two or more emotions that lead to a relapse for them 2. Patient will identify two emotions that result when they relapse 3. Patient will identify two emotions related to recovery 4. Patient will demonstrate ability to communicate their needs through discussion and/or role plays   Summary of Patient Progress:  LCSW was presenting feeling around relapse and ways to reduce relapse and complete daily tasks to increase a patients chance for a good recovery outcome. This patient did listen but on every opportunity to speak the patient could not stay on topic and needed redirection. Towards the end of group he stated that he would prefer 1-1 one time with this worker when I would be able to listen to his theories. LCSW made no promises and provided support to his peers.   Therapeutic Modalities:   Cognitive Behavioral Therapy Solution-Focused Therapy Assertiveness Training Relapse Prevention Therapy   Cheron Schaumann, LCSW 11/05/2017 2:14 PM

## 2017-11-05 NOTE — BHH Group Notes (Signed)
[facility] Group Notes:  (Nursing/MHT/Case Management/Adjunct)  Date:  11/05/2017  Time:  9:34 AM  Type of Therapy:  Psychoeducational Skills  Participation Level:  Active  Participation Quality:  Intrusive, Monopolizing and tangential  Affect:  Defensive and Not Congruent  Cognitive:  Disorganized and Lacking  Insight:  None  Engagement in Group:  Defensive, Distracting, Limited, Monopolizing and Off Topic  Modes of Intervention:  Discussion, Education and Limit-setting  Summary of Progress/Problems:  Austin Rhodes 11/05/2017, 9:34 AM

## 2017-11-05 NOTE — Progress Notes (Signed)
Recreation Therapy Notes  Date: 11/05/2017  Time: 9:30 am   Location: Craft room   Behavioral response: N/A   Intervention Topic: Creative Expressions  Discussion/Intervention: Patient did not attend group.   Clinical Observations/Feedback:  Patient did not attend group.   Bryelle Spiewak LRT/CTRS        Jessilynn Taft 11/05/2017 11:29 AM

## 2017-11-05 NOTE — Tx Team (Signed)
Interdisciplinary Treatment and Diagnostic Plan Update  11/05/2017 Time of Session: 10:50 am JAIDEV SANGER MRN: [id number]  Principal Diagnosis: Bipolar I disorder, current or most recent episode manic, with psychotic features (HCC)  Secondary Diagnoses: Principal Problem:   Bipolar I disorder, current or most recent episode manic, with psychotic features (HCC) Active Problems:   HTN (hypertension)   Current Medications:  Current Facility-Administered Medications  Medication Dose Route Frequency Provider Last Rate Last Dose  . acetaminophen (TYLENOL) tablet 650 mg  650 mg Oral Q6H PRN Beverly Sessions, MD      . alum & mag hydroxide-simeth (MAALOX/MYLANTA) 200-200-20 MG/5ML suspension 30 mL  30 mL Oral Q4H PRN Beverly Sessions, MD      . divalproex (DEPAKOTE) DR tablet 500 mg  500 mg Oral BID Beverly Sessions, MD   500 mg at 11/05/17 0853  . hydrOXYzine (ATARAX/VISTARIL) tablet 25 mg  25 mg Oral TID PRN Beverly Sessions, MD      . [name] ON 11/06/2017] lamoTRIgine (LAMICTAL) tablet 200 mg  200 mg Oral QHS Pucilowska, [name], MD      . magnesium hydroxide (MILK OF MAGNESIA) suspension 30 mL  30 mL Oral Daily PRN Beverly Sessions, MD      . metoprolol succinate (TOPROL-XL) 24 hr tablet 25 mg  25 mg Oral Daily Beverly Sessions, MD   25 mg at 11/05/17 0853  . pneumococcal 23 valent vaccine (PNU-IMMUNE) injection 0.5 mL  0.5 mL Intramuscular Tomorrow-1000 Pucilowska, [name], MD      . QUEtiapine (SEROQUEL) tablet 400 mg  400 mg Oral QHS Pucilowska, [name], MD   400 mg at 11/04/17 2210  . rosuvastatin (CRESTOR) tablet 10 mg  10 mg Oral Daily Beverly Sessions, MD   10 mg at 11/04/17 1825  . traZODone (DESYREL) tablet 50 mg  50 mg Oral QHS PRN Beverly Sessions, MD   50 mg at 11/01/17 2052   PTA Medications: Medications Prior to Admission  Medication Sig Dispense Refill Last Dose  . B Complex-Biotin-FA (B COMPLETE PO) Take 1 tablet by mouth daily.   Not Taking at Unknown  time  . clopidogrel (PLAVIX) 75 MG tablet Take 1 tablet (75 mg total) by mouth daily. (Patient not taking: Reported on 10/29/2017) 30 tablet 3 Not Taking at Unknown time  . divalproex (DEPAKOTE ER) 500 MG 24 hr tablet Take 500 mg by mouth 2 (two) times daily.  0 Not Taking at Unknown time  . lamoTRIgine (LAMICTAL) 200 MG tablet Take 200 mg by mouth daily.  0 Not Taking at Unknown time  . metoprolol succinate (TOPROL-XL) 25 MG 24 hr tablet Take 25 mg by mouth daily.   Not Taking at Unknown time  . Multiple Vitamin (MULTIVITAMIN WITH MINERALS) TABS tablet Take 1 tablet by mouth daily.   Not Taking at Unknown time  . rosuvastatin (CRESTOR) 10 MG tablet Take 10 mg by mouth daily.   Not Taking at Unknown time  . SEROQUEL XR 200 MG 24 hr tablet Take 200 mg by mouth at bedtime.   0 Not Taking at Unknown time    Patient Stressors: Financial difficulties Health problems Medication change or noncompliance Occupational concerns  Patient Strengths: Capable of independent living Motivation for treatment/growth Physical Health  Treatment Modalities: Medication Management, Group therapy, Case management,  1 to 1 session with clinician, Psychoeducation, Recreational therapy.   Physician Treatment Plan for Primary Diagnosis: Bipolar I disorder, current or most recent episode manic, with psychotic features (HCC) Long Term Goal(s): Improvement  in symptoms so as ready for discharge Improvement in symptoms so as ready for discharge   Short Term Goals: Ability to identify changes in lifestyle to reduce recurrence of condition will improve Ability to verbalize feelings will improve Ability to disclose and discuss suicidal ideas Ability to demonstrate self-control will improve Ability to identify and develop effective coping behaviors will improve Ability to maintain clinical measurements within normal limits will improve Compliance with prescribed medications will improve Ability to identify triggers  associated with substance abuse/mental health issues will improve NA  Medication Management: Evaluate patient's response, side effects, and tolerance of medication regimen.  Therapeutic Interventions: 1 to 1 sessions, Unit Group sessions and Medication administration.  Evaluation of Outcomes: Progressing  Physician Treatment Plan for Secondary Diagnosis: Principal Problem:   Bipolar I disorder, current or most recent episode manic, with psychotic features (HCC) Active Problems:   HTN (hypertension)  Long Term Goal(s): Improvement in symptoms so as ready for discharge Improvement in symptoms so as ready for discharge   Short Term Goals: Ability to identify changes in lifestyle to reduce recurrence of condition will improve Ability to verbalize feelings will improve Ability to disclose and discuss suicidal ideas Ability to demonstrate self-control will improve Ability to identify and develop effective coping behaviors will improve Ability to maintain clinical measurements within normal limits will improve Compliance with prescribed medications will improve Ability to identify triggers associated with substance abuse/mental health issues will improve NA     Medication Management: Evaluate patient's response, side effects, and tolerance of medication regimen.  Therapeutic Interventions: 1 to 1 sessions, Unit Group sessions and Medication administration.  Evaluation of Outcomes: Progressing   RN Treatment Plan for Primary Diagnosis: Bipolar I disorder, current or most recent episode manic, with psychotic features (HCC) Long Term Goal(s): Knowledge of disease and therapeutic regimen to maintain health will improve  Short Term Goals: Ability to identify and develop effective coping behaviors will improve and Compliance with prescribed medications will improve  Medication Management: RN will administer medications as ordered by provider, will assess and evaluate patient's response and  provide education to patient for prescribed medication. RN will report any adverse and/or side effects to prescribing provider.  Therapeutic Interventions: 1 on 1 counseling sessions, Psychoeducation, Medication administration, Evaluate responses to treatment, Monitor vital signs and CBGs as ordered, Perform/monitor CIWA, COWS, AIMS and Fall Risk screenings as ordered, Perform wound care treatments as ordered.  Evaluation of Outcomes: Progressing   LCSW Treatment Plan for Primary Diagnosis: Bipolar I disorder, current or most recent episode manic, with psychotic features (HCC) Long Term Goal(s): Safe transition to appropriate next level of care at discharge, Engage patient in therapeutic group addressing interpersonal concerns.  Short Term Goals: Engage patient in aftercare planning with referrals and resources, Identify triggers associated with mental health/substance abuse issues and Increase skills for wellness and recovery  Therapeutic Interventions: Assess for all discharge needs, 1 to 1 time with Social worker, Explore available resources and support systems, Assess for adequacy in community support network, Educate family and significant other(s) on suicide prevention, Complete Psychosocial Assessment, Interpersonal group therapy.  Evaluation of Outcomes: Progressing   Progress in Treatment: Attending groups: Yes. Participating in groups: No. Taking medication as prescribed: Yes. Toleration medication: Yes. Family/Significant other contact made: Yes, individual(s) contacted:  Pt's daughter, Avian Konigsberg, has been contacted. Patient understands diagnosis: Yes. Discussing patient identified problems/goals with staff: Yes. Medical problems stabilized or resolved: Yes. Denies suicidal/homicidal ideation: Yes. Issues/concerns per patient self-inventory: Yes. Other:  New problem(s) identified: No, Describe:     New Short Term/Long Term Goal(s):  Patient Goals:  TBD, Pt unable  to really state as difficult to redirect for delusions of grandiosity   Discharge Plan or Barriers: Tentative plan for pt to return to his home and resume outpatient psychiatric services.  Pt receives good support from his daughter.  Pt is doing better (better hygiene, coming out of his room), but still struggles with delusional thinking.  Reason for Continuation of Hospitalization: Delusions  Medication stabilization  Coordination of aftercare plan  Estimated Length of Stay:5-7  Recreational Therapy: Patient Stressors: N/A Patient Goal: Patient will focus on task/topic with 2 prompts from staff within 5 recreation therapy group sessions  Attendees: Patient: 11/05/2017 1:37 PM  Physician: Braulio Conte Pucilowska 11/05/2017 1:37 PM  Nursing: Horton Marshall, RN 11/05/2017 1:37 PM  RN Care Manager: 11/05/2017 1:37 PM  Social Worker: Huey Romans, LCSW 11/05/2017 1:37 PM  Recreational Therapist: Garret Reddish, LRT 11/05/2017 1:37 PM  Other: Johny Shears, LCSWA 11/05/2017 1:37 PM  Other:  11/05/2017 1:37 PM  Other: 11/05/2017 1:37 PM    Scribe for Treatment Team: Alease Frame, LCSW 11/05/2017 1:37 PM

## 2017-11-05 NOTE — Plan of Care (Signed)
Patient is calm and appropriate with staff & peers.Visible in the milieu.Patient continues to have some delusions states "I am a super busy person I don't have time to sit in these groups."Compliant with medications.Support and encouragent given.

## 2017-11-06 NOTE — [facility] Group Notes (Addendum)
LCSW Group Therapy Note   11/06/2017 1:15pm   Type of Therapy and Topic:  Group Therapy:  Trust and Honesty  Participation Level:  Minimal  Description of Group:    In this group patients will be asked to explore the value of being honest.  Patients will be guided to discuss their thoughts, feelings, and behaviors related to honesty and trusting in others. Patients will process together how trust and honesty relate to forming relationships with peers, family members, and self. Each patient will be challenged to identify and express feelings of being vulnerable. Patients will discuss reasons why people are dishonest and identify alternative outcomes if one was truthful (to self or others). This group will be process-oriented, with patients participating in exploration of their own experiences, giving and receiving support, and processing challenge from other group members.   Therapeutic Goals: 1. Patient will identify why honesty is important to relationships and how honesty overall affects relationships.  2. Patient will identify a situation where they lied or were lied too and the  feelings, thought process, and behaviors surrounding the situation 3. Patient will identify the meaning of being vulnerable, how that feels, and how that correlates to being honest with self and others. 4. Patient will identify situations where they could have told the truth, but instead lied and explain reasons of dishonesty.   Summary of Patient Progress: The patient scored his mood at an 11 (10 best.) The patient did not agree with the group topic and became upset by stating that "talking about honesty and dishonesty was not appropriate." He kept expressing his dislikes in an aggressive way and was very disruptive in group. He was asked by the CSW to leave the room if he didn't calm down. Patient left group very upset.    Therapeutic Modalities:   Cognitive Behavioral Therapy Solution Focused Therapy Motivational  Interviewing Brief Therapy  Meliah Appleman  CUEBAS-COLON, LCSW 11/06/2017 11:59 AM

## 2017-11-06 NOTE — Progress Notes (Signed)
Patient alert and oriented x 4, affect is bright, denies SI/HI/AVH, interacting appropriately with peers and staff, no distress noted, complaint with medication. 15 minutes safety checks maintained will continue to monitor.

## 2017-11-06 NOTE — Plan of Care (Signed)
Patient is alert and oriented to self and place, present in the milieu intermittently for medications and meals, some social interaction with peers noted. Patient is compliant with his medications and tolerates well, no somatic complaints. Milieu remains safe with q 15 minute safety checks. Will continue to monitor.

## 2017-11-06 NOTE — Plan of Care (Signed)
Patient is able to sleep and socialize during the day.

## 2017-11-06 NOTE — Progress Notes (Signed)
[facility] MD Progress Note  11/06/2017 4:04 PM Austin Rhodes  MRN:  [id number]  Subjective:   Austin Rhodes has a history of bipolar disorder destabilized by his mother's recent death in 2022/08/29. Admitted manic, grandiose, delusional.   Austin Rhodes was seen in his room. Austin Rhodes continues to be quite manic. Austin Rhodes said that Austin Rhodes answered "11" when asked about his mood in a scale of 10 in [facility].  Austin Rhodes felt that was the reason for him to "be ejected" from the group today. Later, our staff reported that Austin Rhodes was very disruptive in group, thus, was asked to leave.  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   Principal Problem: Bipolar I disorder, current or most recent episode manic, with psychotic features (HCC) Diagnosis:   Patient Active Problem List   Diagnosis Date Noted  . HTN (hypertension) [I10] 11/01/2017  . Bipolar I disorder, current or most recent episode manic, with psychotic features (HCC) [F31.2] 10/31/2017  . Chest pain [R07.9] 05/09/2015   Total Time spent with patient: 20 minutes  Past Psychiatric History: bipolar disorder  Past Medical History:  Past Medical History:  Diagnosis Date  . Bipolar disorder (HCC)   . Coronary artery disease   . GERD (gastroesophageal reflux disease)   . History of multiple strokes   . PTSD (post-traumatic stress disorder)     Past Surgical History:  Procedure Laterality Date  . CARDIAC CATHETERIZATION     Family History: History reviewed. No pertinent family history. Family Psychiatric  History: none Social History:  Social History   Substance and Sexual Activity  Alcohol Use No     Social History   Substance and Sexual Activity  Drug Use Not on file    Social History   Socioeconomic History  . Marital status: Legally Separated    Spouse name: Not on file  . Number of children: Not on file  . Years of education: Not on file  . Highest  education level: Not on file  Occupational History  . Not on file  Social Needs  . Financial resource strain: Not on file  . Food insecurity:    Worry: Not on file    Inability: Not on file  . Transportation needs:    Medical: Not on file    Non-medical: Not on file  Tobacco Use  . Smoking status: Never Smoker  . Smokeless tobacco: Never Used  Substance and Sexual Activity  . Alcohol use: No  . Drug use: Not on file  . Sexual activity: Not on file  Lifestyle  . Physical activity:    Days per week: Not on file    Minutes per session: Not on file  . Stress: Not on file  Relationships  . Social connections:    Talks on phone: Not on file    Gets together: Not on file    Attends religious service: Not on file    Active member of club or organization: Not on file    Attends meetings of clubs or organizations: Not on file    Relationship status: Not on file  Other Topics Concern  . Not on file  Social History Narrative  . Not on file   Additional Social History:    Sleep: Fair  Appetite:  Fair  Current Medications: Current Facility-Administered Medications  Medication Dose Route Frequency Provider Last Rate Last Dose  .  acetaminophen (TYLENOL) tablet 650 mg  650 mg Oral Q6H PRN Beverly Sessions, MD      . alum & mag hydroxide-simeth (MAALOX/MYLANTA) 200-200-20 MG/5ML suspension 30 mL  30 mL Oral Q4H PRN Beverly Sessions, MD      . divalproex (DEPAKOTE) DR tablet 500 mg  500 mg Oral BID Beverly Sessions, MD   500 mg at 11/06/17 5366  . hydrOXYzine (ATARAX/VISTARIL) tablet 25 mg  25 mg Oral TID PRN Beverly Sessions, MD      . lamoTRIgine (LAMICTAL) tablet 200 mg  200 mg Oral QHS Pucilowska, [name], MD      . magnesium hydroxide (MILK OF MAGNESIA) suspension 30 mL  30 mL Oral Daily PRN Beverly Sessions, MD      . metoprolol succinate (TOPROL-XL) 24 hr tablet 25 mg  25 mg Oral Daily Beverly Sessions, MD   25 mg at 11/06/17 4403  . pneumococcal 23 valent vaccine  (PNU-IMMUNE) injection 0.5 mL  0.5 mL Intramuscular Tomorrow-1000 Pucilowska, [name], MD      . QUEtiapine (SEROQUEL) tablet 400 mg  400 mg Oral QHS Pucilowska, [name], MD   400 mg at 11/05/17 2137  . rosuvastatin (CRESTOR) tablet 10 mg  10 mg Oral Daily Beverly Sessions, MD   10 mg at 11/05/17 1721  . traZODone (DESYREL) tablet 50 mg  50 mg Oral QHS PRN Beverly Sessions, MD   50 mg at 11/05/17 2137    Lab Results:  No results found for this or any previous visit (from the past 48 hour(s)).  Blood Alcohol level:  Lab Results  Component Value Date   ETH <10 10/29/2017   ETH <10 03/31/2017    Metabolic Disorder Labs: Lab Results  Component Value Date   HGBA1C 5.5 11/03/2017   MPG 111 11/03/2017   MPG 123 01/29/2009   No results found for: PROLACTIN Lab Results  Component Value Date   CHOL 133 11/03/2017   TRIG 148 11/03/2017   HDL 44 11/03/2017   CHOLHDL 3.0 11/03/2017   VLDL 30 11/03/2017   LDLCALC 59 11/03/2017   LDLCALC 39 05/10/2015    Physical Findings: AIMS: Facial and Oral Movements Muscles of Facial Expression: None, normal Lips and Perioral Area: None, normal Jaw: None, normal Tongue: None, normal,Extremity Movements Upper (arms, wrists, hands, fingers): None, normal Lower (legs, knees, ankles, toes): None, normal, Trunk Movements Neck, shoulders, hips: None, normal, Overall Severity Severity of abnormal movements (highest score from questions above): None, normal Incapacitation due to abnormal movements: None, normal Patient's awareness of abnormal movements (rate only patient's report): No Awareness, Dental Status Current problems with teeth and/or dentures?: No Does patient usually wear dentures?: No  CIWA:  CIWA-Ar Total: 2 COWS:  COWS Total Score: 3  Musculoskeletal: Strength & Muscle Tone: within normal limits Gait & Station: normal Patient leans: N/A  Psychiatric Specialty Exam: Physical Exam  Nursing note and vitals  reviewed. Psychiatric: His speech is normal. His affect is labile and inappropriate. Austin Rhodes is withdrawn. Thought content is paranoid and delusional. Cognition and memory are impaired. Austin Rhodes expresses impulsivity.    Review of Systems  Neurological: Negative.   Psychiatric/Behavioral: Negative.   All other systems reviewed and are negative.   Blood pressure 112/74, pulse 65, temperature 98.4 F (36.9 C), temperature source Oral, resp. rate 18, height 5\' 9"  (1.753 m), weight 85.3 kg, SpO2 97 %.Body mass index is 27.76 kg/m.  General Appearance: Fairly Groomed  Eye Contact:  Good  Speech:  Clear and Coherent and  Pressured  Volume:  Normal  Mood:  Euphoric  Affect:  Inappropriate  Thought Process:  Disorganized, Irrelevant and Descriptions of Associations: Loose  Orientation:  Full (Time, Place, and Person)  Thought Content:  Delusions and Paranoid Ideation  Suicidal Thoughts:  No  Homicidal Thoughts:  No  Memory:  Immediate;   Fair Recent;   Fair Remote;   Fair  Judgement:  Poor  Insight:  Lacking  Psychomotor Activity:  Decreased  Concentration:  Concentration: Poor and Attention Span: Poor  Recall:  Poor  Fund of Knowledge:  Poor  Language:  Poor  Akathisia:  No  Handed:  Right  AIMS (if indicated):     Assets:  Communication Skills Desire for Improvement Financial Resources/Insurance Housing Physical Health Resilience Social Support  ADL's:  Intact  Cognition:  WNL  Sleep:  Number of Hours: 7.15     Treatment Plan Summary: Daily contact with patient to assess and evaluate symptoms and progress in treatment and Medication management   Mr. Drinkard is a 67 year old male with a history of bipolar disorder admitted for a manic episode. Came very paranoid and disorganized. Improving.  #Mood/psychosis, improving -continue Depakote 500 mg BID, VPA level 52 and ammonia 33 on 10/3 -change Lamictal 200 mg to night time  -continueSeroquelto400 mg nightly -Trazodone 100 mg  nightly PRN  #HTN -Metoprolol 12.5 mg BID -Crestor 10 mg daily  #Labs -lipid panelnormal, TSHnormal, A1C -EKGreviewed, sinus tachycardia with QTc 431  #Disposition -discharge to home -follow up with [facility]  Cherylynn Liszewski, MD 11/06/2017, 4:04 PM

## 2017-11-07 MED ORDER — HALOPERIDOL 5 MG PO TABS
10.0000 mg | ORAL_TABLET | Freq: Every day | ORAL | Status: DC
  Administered 2017-11-07 – 2017-11-09 (×3): 10 mg via ORAL
  Filled 2017-11-07 (×3): qty 2

## 2017-11-07 MED ORDER — DIVALPROEX SODIUM 500 MG PO DR TAB
750.0000 mg | DELAYED_RELEASE_TABLET | Freq: Two times a day (BID) | ORAL | Status: DC
  Administered 2017-11-08 (×2): 750 mg via ORAL
  Filled 2017-11-07 (×2): qty 1

## 2017-11-07 NOTE — [facility] Group Notes (Signed)
[facility] Group Notes:  (Nursing/MHT/Case Management/Adjunct)  Date:  11/07/2017  Time:  9:57 PM  Type of Therapy:  Group Therapy  Participation Level:  Did Not Attend   Jinger Neighbors 11/07/2017, 9:57 PM

## 2017-11-07 NOTE — Progress Notes (Addendum)
[facility] MD Progress Note  11/08/2017 4:25 PM Austin Rhodes  MRN:  [id number]  Subjective:    Austin Rhodes is still grandiose and disorganized. He can hold a decent conversation for a few minutes but no longer. He is again talking about his degrees, purchases, sawing [place] from [place]. He has multiple complaints about his peers. Conflict over remote, unable to participate in groups due to disruptive behavior. Complains of tremor from Depakote.   Principal Problem: Bipolar I disorder, current or most recent episode manic, with psychotic features (HCC) Diagnosis:   Patient Active Problem List   Diagnosis Date Noted  . Bipolar I disorder, current or most recent episode manic, with psychotic features (HCC) [F31.2] 10/31/2017    Priority: High  . HTN (hypertension) [I10] 11/01/2017  . Chest pain [R07.9] 05/09/2015   Total Time spent with patient: 20 minutes  Past Psychiatric History: bipolar disorder  Past Medical History:  Past Medical History:  Diagnosis Date  . Bipolar disorder (HCC)   . Coronary artery disease   . GERD (gastroesophageal reflux disease)   . History of multiple strokes   . PTSD (post-traumatic stress disorder)     Past Surgical History:  Procedure Laterality Date  . CARDIAC CATHETERIZATION     Family History: History reviewed. No pertinent family history. Family Psychiatric  History: bipolar Social History:  Social History   Substance and Sexual Activity  Alcohol Use No     Social History   Substance and Sexual Activity  Drug Use Not on file    Social History   Socioeconomic History  . Marital status: Legally Separated    Spouse name: Not on file  . Number of children: Not on file  . Years of education: Not on file  . Highest education level: Not on file  Occupational History  . Not on file  Social Needs  . Financial resource strain: Not on file  . Food insecurity:    Worry: Not on file    Inability: Not on file  . Transportation needs:     Medical: Not on file    Non-medical: Not on file  Tobacco Use  . Smoking status: Never Smoker  . Smokeless tobacco: Never Used  Substance and Sexual Activity  . Alcohol use: No  . Drug use: Not on file  . Sexual activity: Not on file  Lifestyle  . Physical activity:    Days per week: Not on file    Minutes per session: Not on file  . Stress: Not on file  Relationships  . Social connections:    Talks on phone: Not on file    Gets together: Not on file    Attends religious service: Not on file    Active member of club or organization: Not on file    Attends meetings of clubs or organizations: Not on file    Relationship status: Not on file  Other Topics Concern  . Not on file  Social History Narrative  . Not on file   Additional Social History:                         Sleep: Fair  Appetite:  Fair  Current Medications: Current Facility-Administered Medications  Medication Dose Route Frequency Provider Last Rate Last Dose  . acetaminophen (TYLENOL) tablet 650 mg  650 mg Oral Q6H PRN Beverly Sessions, MD      . alum & mag hydroxide-simeth (MAALOX/MYLANTA) 200-200-20 MG/5ML suspension 30 mL  30 mL Oral Q4H PRN Beverly Sessions, MD      . divalproex (DEPAKOTE) DR tablet 750 mg  750 mg Oral BID Raylee Adamec B, MD   750 mg at 11/08/17 0805  . haloperidol (HALDOL) tablet 10 mg  10 mg Oral QHS Kailer Heindel B, MD   10 mg at 11/07/17 2109  . lamoTRIgine (LAMICTAL) tablet 200 mg  200 mg Oral QHS Jamon Hayhurst B, MD   200 mg at 11/07/17 2107  . magnesium hydroxide (MILK OF MAGNESIA) suspension 30 mL  30 mL Oral Daily PRN Beverly Sessions, MD      . metoprolol succinate (TOPROL-XL) 24 hr tablet 25 mg  25 mg Oral Daily Beverly Sessions, MD   25 mg at 11/08/17 0805  . pneumococcal 23 valent vaccine (PNU-IMMUNE) injection 0.5 mL  0.5 mL Intramuscular Tomorrow-1000 Jaythan Hinely B, MD      . QUEtiapine (SEROQUEL) tablet 400 mg  400 mg Oral QHS  Carrol Hougland B, MD   400 mg at 11/07/17 2107  . rosuvastatin (CRESTOR) tablet 10 mg  10 mg Oral Daily Beverly Sessions, MD   10 mg at 11/07/17 1701  . traZODone (DESYREL) tablet 50 mg  50 mg Oral QHS PRN Beverly Sessions, MD   50 mg at 11/07/17 2107    Lab Results: No results found for this or any previous visit (from the past 48 hour(s)).  Blood Alcohol level:  Lab Results  Component Value Date   ETH <10 10/29/2017   ETH <10 03/31/2017    Metabolic Disorder Labs: Lab Results  Component Value Date   HGBA1C 5.5 11/03/2017   MPG 111 11/03/2017   MPG 123 01/29/2009   No results found for: PROLACTIN Lab Results  Component Value Date   CHOL 133 11/03/2017   TRIG 148 11/03/2017   HDL 44 11/03/2017   CHOLHDL 3.0 11/03/2017   VLDL 30 11/03/2017   LDLCALC 59 11/03/2017   LDLCALC 39 05/10/2015    Physical Findings: AIMS: Facial and Oral Movements Muscles of Facial Expression: None, normal Lips and Perioral Area: None, normal Jaw: None, normal Tongue: None, normal,Extremity Movements Upper (arms, wrists, hands, fingers): None, normal Lower (legs, knees, ankles, toes): None, normal, Trunk Movements Neck, shoulders, hips: None, normal, Overall Severity Severity of abnormal movements (highest score from questions above): None, normal Incapacitation due to abnormal movements: None, normal Patient's awareness of abnormal movements (rate only patient's report): No Awareness, Dental Status Current problems with teeth and/or dentures?: No Does patient usually wear dentures?: No  CIWA:  CIWA-Ar Total: 2 COWS:  COWS Total Score: 3  Musculoskeletal: Strength & Muscle Tone: within normal limits Gait & Station: normal Patient leans: N/A  Psychiatric Specialty Exam: Physical Exam  Nursing note and vitals reviewed. Psychiatric: His affect is labile and inappropriate. His speech is rapid and/or pressured. He is hyperactive. Thought content is paranoid and delusional. Cognition  and memory are impaired. He expresses impulsivity.    Review of Systems  Neurological: Negative.   Psychiatric/Behavioral: Negative.   All other systems reviewed and are negative.   Blood pressure 128/77, pulse 70, temperature 97.7 F (36.5 C), temperature source Oral, resp. rate 16, height 5\' 9"  (1.753 m), weight 85.3 kg, SpO2 99 %.Body mass index is 27.76 kg/m.  General Appearance: Fairly Groomed  Eye Contact:  Good  Speech:  Pressured  Volume:  Increased  Mood:  Dysphoric and Irritable  Affect:  Congruent  Thought Process:  Disorganized, Irrelevant and Descriptions of Associations: Loose  Orientation:  Full (Time, Place, and Person)  Thought Content:  Illogical, Delusions and Paranoid Ideation  Suicidal Thoughts:  No  Homicidal Thoughts:  No  Memory:  Immediate;   Fair Recent;   Poor Remote;   Poor  Judgement:  Poor  Insight:  Lacking  Psychomotor Activity:  Increased  Concentration:  Concentration: Poor and Attention Span: Poor  Recall:  Poor  Fund of Knowledge:  Poor  Language:  Poor  Akathisia:  No  Handed:  Right  AIMS (if indicated):     Assets:  Communication Skills Desire for Improvement Financial Resources/Insurance Housing Physical Health Resilience Social Support  ADL's:  Intact  Cognition:  WNL  Sleep:  Number of Hours: 7     Treatment Plan Summary: Daily contact with patient to assess and evaluate symptoms and progress in treatment and Medication management   Austin Rhodes is a 67 year old male with a history of bipolar disorder admitted for a manic episode. Came very paranoid and disorganized. His Mania and paranoia are improved, but still quite disorganized.   #Mood/psychosis, improving -increase Depakote to 750 mg BID, VPA level 52 and ammonia 33 on 10/3 -continue Lamictal 200 mg nightly   -continueSeroquelto400 mg nightly -Trazodone 100 mg nightly PRN -start Haldol 10 mg nightly  #HTN -Metoprolol 12.5 mg BID -Crestor 10 mg  daily  #Labs -lipid panelnormal, TSHnormal, A1C -EKGreviewed, sinus tachycardia with QTc 431  #Disposition -discharge to home -follow up with Loletta Parish, MD 11/08/2017, 4:25 PM

## 2017-11-07 NOTE — Plan of Care (Signed)
Patient is still promoting self grandiose of delusions, and maintaining safety , no aggressions , compliant with medicines and coping nicely with group activities, appetite is good and well hydrated with fluids and juices, voice no complain, denies any SI and Hi, some delusions present with discussions pertinent to health issues, no distress noted. 15 minute safety round is maintained.   Problem: Activity: Goal: Will verbalize the importance of balancing activity with adequate rest periods Outcome: Progressing   Problem: Coping: Goal: Coping ability will improve Outcome: Progressing Goal: Will verbalize feelings Outcome: Progressing   Problem: Coping: Goal: Coping ability will improve Outcome: Progressing Goal: Will verbalize feelings Outcome: Progressing   Problem: Safety: Goal: Ability to disclose and discuss suicidal ideas will improve Outcome: Progressing Goal: Ability to identify and utilize support systems that promote safety will improve Outcome: Progressing   Problem: Education: Goal: Ability to state activities that reduce stress will improve Outcome: Progressing   Problem: Self-Concept: Goal: Ability to identify factors that promote anxiety will improve Outcome: Progressing Goal: Level of anxiety will decrease Outcome: Progressing Goal: Ability to modify response to factors that promote anxiety will improve Outcome: Progressing   Problem: Education: Goal: Knowledge of General Education information will improve Description Including pain rating scale, medication(s)/side effects and non-pharmacologic comfort measures Outcome: Progressing   Problem: Health Behavior/Discharge Planning: Goal: Ability to manage health-related needs will improve Outcome: Progressing   Problem: Clinical Measurements: Goal: Ability to maintain clinical measurements within normal limits will improve Outcome: Progressing Goal: Will remain free from infection Outcome:  Progressing Goal: Diagnostic test results will improve Outcome: Progressing Goal: Respiratory complications will improve Outcome: Progressing Goal: Cardiovascular complication will be avoided Outcome: Progressing   Problem: Activity: Goal: Risk for activity intolerance will decrease Outcome: Progressing   Problem: Nutrition: Goal: Adequate nutrition will be maintained Outcome: Progressing   Problem: Coping: Goal: Level of anxiety will decrease Outcome: Progressing   Problem: Elimination: Goal: Will not experience complications related to bowel motility Outcome: Progressing Goal: Will not experience complications related to urinary retention Outcome: Progressing   Problem: Pain Managment: Goal: General experience of comfort will improve Outcome: Progressing   Problem: Safety: Goal: Ability to remain free from injury will improve Outcome: Progressing   Problem: Skin Integrity: Goal: Risk for impaired skin integrity will decrease Outcome: Progressing

## 2017-11-07 NOTE — [facility] Group Notes (Signed)
LCSW Group Therapy Note 11/07/2017 1:15pm  Type of Therapy and Topic: Group Therapy: Feelings Around Returning Home & Establishing a Supportive Framework and Supporting Oneself When Supports Not Available  Participation Level: Did Not Attend  Description of Group:  Patients first processed thoughts and feelings about upcoming discharge. These included fears of upcoming changes, lack of change, new living environments, judgements and expectations from others and overall stigma of mental health issues. The group then discussed the definition of a supportive framework, what that looks and feels like, and how do to discern it from an unhealthy non-supportive network. The group identified different types of supports as well as what to do when your family/friends are less than helpful or unavailable  Therapeutic Goals  1. Patient will identify one healthy supportive network that they can use at discharge. 2. Patient will identify one factor of a supportive framework and how to tell it from an unhealthy network. 3. Patient able to identify one coping skill to use when they do not have positive supports from others. 4. Patient will demonstrate ability to communicate their needs through discussion and/or role plays.  Summary of Patient Progress:  Pt was invited to attend group but chose not to attend. CSW will continue to encourage pt to attend group throughout their admission.    Therapeutic Modalities Cognitive Behavioral Therapy Motivational Interviewing   Nicolet Griffy  CUEBAS-COLON, LCSW 11/07/2017 11:42 AM

## 2017-11-07 NOTE — Progress Notes (Signed)
[facility] MD Progress Note  11/07/2017 1:58 PM Austin Rhodes  MRN:  [id number]  Subjective:   Austin Rhodes has a history of bipolar disorder destabilized by his mother's recent death in 09/09/22. Admitted manic, grandiose, delusional.   Austin Rhodes is visibly calmer. His speech is less pressured and not as loud.  However, Austin Rhodes continues to be grandiose and disorganized. 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 Austin Rhodes went on and on about different things that are not logically related and difficult to follow.   However, it is a lot easier to be redirected.  Austin Rhodes subjectively denied any racing thoughts.   Austin Rhodes tolerates meds, no SI or HI, denied AVH.   Principal Problem: Bipolar I disorder, current or most recent episode manic, with psychotic features (HCC) Diagnosis:   Patient Active Problem List   Diagnosis Date Noted  . HTN (hypertension) [I10] 11/01/2017  . Bipolar I disorder, current or most recent episode manic, with psychotic features (HCC) [F31.2] 10/31/2017  . Chest pain [R07.9] 05/09/2015   Total Time spent with patient: 30 minutes  Past Psychiatric History: bipolar disorder  Past Medical History:  Past Medical History:  Diagnosis Date  . Bipolar disorder (HCC)   . Coronary artery disease   . GERD (gastroesophageal reflux disease)   . History of multiple strokes   . PTSD (post-traumatic stress disorder)     Past Surgical History:  Procedure Laterality Date  . CARDIAC CATHETERIZATION     Family History: History reviewed. No pertinent family history. Family Psychiatric  History: none Social History:  Social History   Substance and Sexual Activity  Alcohol Use No     Social History   Substance and Sexual Activity  Drug Use Not on file    Social History   Socioeconomic History  . Marital status: Legally Separated    Spouse  name: Not on file  . Number of children: Not on file  . Years of education: Not on file  . Highest education level: Not on file  Occupational History  . Not on file  Social Needs  . Financial resource strain: Not on file  . Food insecurity:    Worry: Not on file    Inability: Not on file  . Transportation needs:    Medical: Not on file    Non-medical: Not on file  Tobacco Use  . Smoking status: Never Smoker  . Smokeless tobacco: Never Used  Substance and Sexual Activity  . Alcohol use: No  . Drug use: Not on file  . Sexual activity: Not on file  Lifestyle  . Physical activity:    Days per week: Not on file    Minutes per session: Not on file  . Stress: Not on file  Relationships  . Social connections:    Talks on phone: Not on file    Gets together: Not on file    Attends religious service: Not on file    Active member of club or organization: Not on file    Attends meetings of clubs or organizations: Not on file    Relationship status: Not on file  Other Topics Concern  . Not on file  Social History Narrative  . Not on file   Additional Social History:    Sleep: Fair  Appetite:  Fair  Current Medications: Current Facility-Administered Medications  Medication Dose Route Frequency Provider Last Rate Last Dose  . acetaminophen (TYLENOL) tablet 650 mg  650 mg Oral Q6H PRN Beverly Sessions, MD      . alum & mag hydroxide-simeth (MAALOX/MYLANTA) 200-200-20 MG/5ML suspension 30 mL  30 mL Oral Q4H PRN Beverly Sessions, MD      . divalproex (DEPAKOTE) DR tablet 500 mg  500 mg Oral BID Beverly Sessions, MD   500 mg at 11/07/17 0744  . hydrOXYzine (ATARAX/VISTARIL) tablet 25 mg  25 mg Oral TID PRN Beverly Sessions, MD      . lamoTRIgine (LAMICTAL) tablet 200 mg  200 mg Oral QHS Pucilowska, [name], MD   200 mg at 11/06/17 2123  . magnesium hydroxide (MILK OF MAGNESIA) suspension 30 mL  30 mL Oral Daily PRN Beverly Sessions, MD      . metoprolol succinate (TOPROL-XL)  24 hr tablet 25 mg  25 mg Oral Daily Beverly Sessions, MD   25 mg at 11/07/17 0743  . pneumococcal 23 valent vaccine (PNU-IMMUNE) injection 0.5 mL  0.5 mL Intramuscular Tomorrow-1000 Pucilowska, [name], MD      . QUEtiapine (SEROQUEL) tablet 400 mg  400 mg Oral QHS Pucilowska, [name], MD   400 mg at 11/06/17 2123  . rosuvastatin (CRESTOR) tablet 10 mg  10 mg Oral Daily Beverly Sessions, MD   10 mg at 11/06/17 1724  . traZODone (DESYREL) tablet 50 mg  50 mg Oral QHS PRN Beverly Sessions, MD   50 mg at 11/05/17 2137    Lab Results:  No results found for this or any previous visit (from the past 48 hour(s)).  Blood Alcohol level:  Lab Results  Component Value Date   ETH <10 10/29/2017   ETH <10 03/31/2017    Metabolic Disorder Labs: Lab Results  Component Value Date   HGBA1C 5.5 11/03/2017   MPG 111 11/03/2017   MPG 123 01/29/2009   No results found for: PROLACTIN Lab Results  Component Value Date   CHOL 133 11/03/2017   TRIG 148 11/03/2017   HDL 44 11/03/2017   CHOLHDL 3.0 11/03/2017   VLDL 30 11/03/2017   LDLCALC 59 11/03/2017   LDLCALC 39 05/10/2015    Physical Findings: AIMS: Facial and Oral Movements Muscles of Facial Expression: None, normal Lips and Perioral Area: None, normal Jaw: None, normal Tongue: None, normal,Extremity Movements Upper (arms, wrists, hands, fingers): None, normal Lower (legs, knees, ankles, toes): None, normal, Trunk Movements Neck, shoulders, hips: None, normal, Overall Severity Severity of abnormal movements (highest score from questions above): None, normal Incapacitation due to abnormal movements: None, normal Patient's awareness of abnormal movements (rate only patient's report): No Awareness, Dental Status Current problems with teeth and/or dentures?: No Does patient usually wear dentures?: No  CIWA:  CIWA-Ar Total: 2 COWS:  COWS Total Score: 3  Musculoskeletal: Strength & Muscle Tone: within normal limits Gait & Station:  normal Patient leans: N/A  Psychiatric Specialty Exam: Physical Exam  Nursing note and vitals reviewed. Psychiatric: His speech is normal. His affect is labile and inappropriate. Austin Rhodes is withdrawn. Thought content is paranoid and delusional. Cognition and memory are impaired. Austin Rhodes expresses impulsivity.    Review of Systems  Neurological: Negative.   Psychiatric/Behavioral: Negative.   All other systems reviewed and are negative.   Blood pressure 122/78, pulse 100, temperature 98.4 F (36.9 C), temperature source Oral, resp. rate 16, height 5\' 9"  (1.753 m), weight 85.3 kg, SpO2 99 %.Body mass index is 27.76 kg/m.  General Appearance:  Fairly Groomed  Eye Contact:  Good  Speech:  Clear and Coherent and Normal Rate  Volume:  Normal  Mood:  Euphoric  Affect:  Inappropriate  Thought Process:  Disorganized, Irrelevant and Descriptions of Associations: Loose  Orientation:  Full (Time, Place, and Person)  Thought Content:  Delusions and Paranoid Ideation  Suicidal Thoughts:  No  Homicidal Thoughts:  No  Memory:  Immediate;   Fair Recent;   Fair Remote;   Fair  Judgement:  Poor  Insight:  Lacking  Psychomotor Activity:  Decreased  Concentration:  Concentration: Poor and Attention Span: Poor  Recall:  Poor  Fund of Knowledge:  Poor  Language:  Poor  Akathisia:  No  Handed:  Right  AIMS (if indicated):     Assets:  Communication Skills Desire for Improvement Financial Resources/Insurance Housing Physical Health Resilience Social Support  ADL's:  Intact  Cognition:  WNL  Sleep:  Number of Hours: 7.3     Treatment Plan Summary: Daily contact with patient to assess and evaluate symptoms and progress in treatment and Medication management   Austin Rhodes is a 67 year old male with a history of bipolar disorder admitted for a manic episode. Came very paranoid and disorganized. His Mania and paranoia are improved, but still quite disorganized.   #Mood/psychosis,  improving -continue Depakote 500 mg BID, VPA level 52 and ammonia 33 on 10/3 -change Lamictal 200 mg to night time  -continueSeroquelto400 mg nightly -Trazodone 100 mg nightly PRN  #HTN -Metoprolol 12.5 mg BID -Crestor 10 mg daily  #Labs -lipid panelnormal, TSHnormal, A1C -EKGreviewed, sinus tachycardia with QTc 431  #Disposition -discharge to home -follow up with [facility]  Austin Porcaro, MD 11/07/2017, 1:58 PM

## 2017-11-07 NOTE — Plan of Care (Signed)
Patient is alert and oriented to self, place and situation. Patient aware of his medications and was educated on some of the side affects. Present in the milieu intermittently for meals and medications. Observed interacting selectively with peers and participates actively in groups. Milieu remains safe with q 15 minute safety checks. Will continue to monitor. Denies SI/HI/AVH and pain at this time. 

## 2017-11-08 MED ORDER — DIVALPROEX SODIUM 500 MG PO DR TAB
500.0000 mg | DELAYED_RELEASE_TABLET | Freq: Two times a day (BID) | ORAL | Status: DC
  Administered 2017-11-09 – 2017-11-10 (×4): 500 mg via ORAL
  Filled 2017-11-08 (×4): qty 1

## 2017-11-08 NOTE — Progress Notes (Signed)
Patient ID: ISABELLA IDA, male   DOB: 1950/08/30, 67 y.o.   MRN: [id number] PER STATE REGULATIONS 482.30  THIS CHART WAS REVIEWED FOR MEDICAL NECESSITY WITH RESPECT TO THE PATIENT'S ADMISSION/ DURATION OF STAY.  NEXT REVIEW DATE: 11/12/2017 Willa Rough, RN, BSN CASE MANAGER

## 2017-11-08 NOTE — Plan of Care (Signed)
Patient is alert and oriented to self, place and situation. Patient aware of his medications and was educated on some of the side affects. Present in the milieu intermittently for meals and medications. Observed interacting selectively with peers and participates actively in groups. Milieu remains safe with q 15 minute safety checks. Will continue to monitor. Denies SI/HI/AVH and pain at this time.

## 2017-11-08 NOTE — [facility] Group Notes (Signed)
[facility] Group Notes:  (Nursing/MHT/Case Management/Adjunct)  Date:  11/08/2017  Time:  10:33 PM  Type of Therapy:  Group Therapy  Participation Level:  Active  Participation Quality:  Appropriate  Affect:  Appropriate  Cognitive:  Appropriate  Insight:  Appropriate  Engagement in Group:  Engaged  Modes of Intervention:  Discussion  Summary of Progress/Problems: Tashawn stated he did not have a goal on this day. Maico stated he would have a goal on tomorrow. Sisto completed his self inventory sheet. Irene denied depression and anxiety. Denies any physical pain. Reports his goal is to go to group. MHT reviewed rules and expectations of the unit. MHT processed with patients about making routine checks throughout the night. MHT encouraged patients to complete self-inventory sheet. MHT processed with patients about seeking outpatient treatment when discharged from the unit. MHT explained the expected benefits of working with a mental health provider in the community. MHT encouraged patients to comply with doctor recommendations and follow up appointments.  Jinger Neighbors 11/08/2017, 10:33 PM

## 2017-11-08 NOTE — Progress Notes (Signed)
Recreation Therapy Notes  Date: 11/08/2017  Time: 9:30 am   Location: Craft room   Behavioral response: N/A   Intervention Topic: Self-esteem  Discussion/Intervention: Patient did not attend group.   Clinical Observations/Feedback:  Patient did not attend group.   Austin Rhodes LRT/CTRS        Jury Caserta 11/08/2017 10:34 AM

## 2017-11-08 NOTE — Plan of Care (Signed)
Patient found awake in bed upon my arrival. Patient is neither visible nor social with peers today. Patient reports "good" mood and affect is full range. Patient denies all complaints including SI/HI/AVH. Patient speech is less pressured and less tangential tonight. Does not talk about "whistle blowing" this evening. Reports eating and voiding adequately. Denies pain. Compliant with HS medications and staff direction. Q 15 minute checks maintained. Will continue to monitor throughout the shift. Patient slept 7 hours. No apparent distress. Will endorse care to oncoming shift.  Problem: Coping: Goal: Coping ability will improve Outcome: Progressing Goal: Will verbalize feelings Outcome: Progressing   Problem: Self-Concept: Goal: Level of anxiety will decrease Outcome: Progressing

## 2017-11-08 NOTE — Progress Notes (Addendum)
[facility] MD Progress Note  11/09/2017 4:08 PM Austin Rhodes  MRN:  [id number]  Subjective:    Still very grandiose and irritable. Complains about his peers a lot and wants to go home. Disruptive in groups and argumentative in dayroom. Very poor hygiene even though he claims to shower.   Spoke with his daughter, who sees improvement. It is unclear if he is able to do ADLs properly.   Principal Problem: Bipolar I disorder, current or most recent episode manic, with psychotic features (HCC) Diagnosis:   Patient Active Problem List   Diagnosis Date Noted  . Bipolar I disorder, current or most recent episode manic, with psychotic features (HCC) [F31.2] 10/31/2017    Priority: High  . HTN (hypertension) [I10] 11/01/2017  . Chest pain [R07.9] 05/09/2015   Total Time spent with patient: 20 minutes  Past Psychiatric History: bipolar  Past Medical History:  Past Medical History:  Diagnosis Date  . Bipolar disorder (HCC)   . Coronary artery disease   . GERD (gastroesophageal reflux disease)   . History of multiple strokes   . PTSD (post-traumatic stress disorder)     Past Surgical History:  Procedure Laterality Date  . CARDIAC CATHETERIZATION     Family History: History reviewed. No pertinent family history. Family Psychiatric  History: none Social History:  Social History   Substance and Sexual Activity  Alcohol Use No     Social History   Substance and Sexual Activity  Drug Use Not on file    Social History   Socioeconomic History  . Marital status: Legally Separated    Spouse name: Not on file  . Number of children: Not on file  . Years of education: Not on file  . Highest education level: Not on file  Occupational History  . Not on file  Social Needs  . Financial resource strain: Not on file  . Food insecurity:    Worry: Not on file    Inability: Not on file  . Transportation needs:    Medical: Not on file    Non-medical: Not on file  Tobacco Use  .  Smoking status: Never Smoker  . Smokeless tobacco: Never Used  Substance and Sexual Activity  . Alcohol use: No  . Drug use: Not on file  . Sexual activity: Not on file  Lifestyle  . Physical activity:    Days per week: Not on file    Minutes per session: Not on file  . Stress: Not on file  Relationships  . Social connections:    Talks on phone: Not on file    Gets together: Not on file    Attends religious service: Not on file    Active member of club or organization: Not on file    Attends meetings of clubs or organizations: Not on file    Relationship status: Not on file  Other Topics Concern  . Not on file  Social History Narrative  . Not on file   Additional Social History:                         Sleep: Fair  Appetite:  Fair  Current Medications: Current Facility-Administered Medications  Medication Dose Route Frequency Provider Last Rate Last Dose  . acetaminophen (TYLENOL) tablet 650 mg  650 mg Oral Q6H PRN Beverly Sessions, MD      . alum & mag hydroxide-simeth (MAALOX/MYLANTA) 200-200-20 MG/5ML suspension 30 mL  30 mL Oral Q4H PRN  Beverly Sessions, MD      . divalproex (DEPAKOTE) DR tablet 500 mg  500 mg Oral BID Pucilowska, [name], MD   500 mg at 11/09/17 0812  . haloperidol (HALDOL) tablet 10 mg  10 mg Oral QHS Pucilowska, [name], MD   10 mg at 11/08/17 2146  . lamoTRIgine (LAMICTAL) tablet 200 mg  200 mg Oral QHS Pucilowska, [name], MD   200 mg at 11/08/17 2146  . magnesium hydroxide (MILK OF MAGNESIA) suspension 30 mL  30 mL Oral Daily PRN Beverly Sessions, MD      . metoprolol succinate (TOPROL-XL) 24 hr tablet 25 mg  25 mg Oral Daily Beverly Sessions, MD   25 mg at 11/09/17 1610  . pneumococcal 23 valent vaccine (PNU-IMMUNE) injection 0.5 mL  0.5 mL Intramuscular Tomorrow-1000 Pucilowska, [name], MD      . QUEtiapine (SEROQUEL) tablet 400 mg  400 mg Oral QHS Pucilowska, [name], MD   400 mg at 11/08/17 2146  . rosuvastatin  (CRESTOR) tablet 10 mg  10 mg Oral Daily Beverly Sessions, MD   10 mg at 11/08/17 1716  . traZODone (DESYREL) tablet 50 mg  50 mg Oral QHS PRN Beverly Sessions, MD   50 mg at 11/08/17 2146    Lab Results: No results found for this or any previous visit (from the past 48 hour(s)).  Blood Alcohol level:  Lab Results  Component Value Date   ETH <10 10/29/2017   ETH <10 03/31/2017    Metabolic Disorder Labs: Lab Results  Component Value Date   HGBA1C 5.5 11/03/2017   MPG 111 11/03/2017   MPG 123 01/29/2009   No results found for: PROLACTIN Lab Results  Component Value Date   CHOL 133 11/03/2017   TRIG 148 11/03/2017   HDL 44 11/03/2017   CHOLHDL 3.0 11/03/2017   VLDL 30 11/03/2017   LDLCALC 59 11/03/2017   LDLCALC 39 05/10/2015    Physical Findings: AIMS: Facial and Oral Movements Muscles of Facial Expression: None, normal Lips and Perioral Area: None, normal Jaw: None, normal Tongue: None, normal,Extremity Movements Upper (arms, wrists, hands, fingers): None, normal Lower (legs, knees, ankles, toes): None, normal, Trunk Movements Neck, shoulders, hips: None, normal, Overall Severity Severity of abnormal movements (highest score from questions above): None, normal Incapacitation due to abnormal movements: None, normal Patient's awareness of abnormal movements (rate only patient's report): No Awareness, Dental Status Current problems with teeth and/or dentures?: No Does patient usually wear dentures?: No  CIWA:  CIWA-Ar Total: 2 COWS:  COWS Total Score: 3  Musculoskeletal: Strength & Muscle Tone: within normal limits Gait & Station: normal Patient leans: N/A  Psychiatric Specialty Exam: Physical Exam  Nursing note and vitals reviewed. Psychiatric: His affect is angry, labile and inappropriate. His speech is rapid and/or pressured. He is withdrawn. Thought content is paranoid and delusional. Cognition and memory are impaired. He expresses impulsivity.    Review  of Systems  Neurological: Negative.   Psychiatric/Behavioral: Negative.   All other systems reviewed and are negative.   Blood pressure 112/76, pulse 66, temperature 97.9 F (36.6 C), resp. rate 18, height 5\' 9"  (1.753 m), weight 85.3 kg, SpO2 96 %.Body mass index is 27.76 kg/m.  General Appearance: Disheveled  Eye Contact:  Good  Speech:  Clear and Coherent and Pressured  Volume:  Increased  Mood:  Dysphoric and Irritable  Affect:  Inappropriate and Labile  Thought Process:  Disorganized, Irrelevant and Descriptions of Associations: Loose  Orientation:  Full (Time, Place, and Person)  Thought Content:  Illogical, Delusions and Paranoid Ideation  Suicidal Thoughts:  No  Homicidal Thoughts:  No  Memory:  Immediate;   Poor Recent;   Poor Remote;   Poor  Judgement:  Poor  Insight:  Lacking  Psychomotor Activity:  Increased  Concentration:  Concentration: Poor and Attention Span: Poor  Recall:  Poor  Fund of Knowledge:  Poor  Language:  Poor  Akathisia:  No  Handed:  Right  AIMS (if indicated):     Assets:  Communication Skills Desire for Improvement Financial Resources/Insurance Housing Physical Health Resilience Social Support  ADL's:  Intact  Cognition:  WNL  Sleep:  Number of Hours: 7.25     Treatment Plan Summary: Daily contact with patient to assess and evaluate symptoms and progress in treatment and Medication management   Austin Rhodes is a 67 year old male with a history of bipolar disorder admitted for a manic episode. Came very paranoid and disorganized.His Mania and paranoia are improved, but still quite disorganized.  #Mood/psychosis, improving -continue Depakote 500 mg BID, worried about interaction with Lamictal, VPA level 52 and ammonia 33 on 10/3 -continue Lamictal 200 mg nightly   -continueSeroquelto400 mg nightly -Trazodone 100 mg nightly PRN -continue Haldol 10 mg nightly -consider Haldol decanoate injection but the daughter confirms good  med complinace  #HTN -Metoprolol 12.5 mg BID -Crestor 10 mg daily  #Labs -lipid panelnormal, TSHnormal, A1C -EKGreviewed, sinus tachycardia with QTc 431  #Disposition -discharge to home -follow up with Loletta Parish, MD 11/09/2017, 4:08 PM

## 2017-11-09 MED ORDER — LAMOTRIGINE 200 MG PO TABS: 200.0000 mg | ORAL_TABLET | Freq: Every day | ORAL | 1 refills | Status: DC

## 2017-11-09 MED ORDER — METOPROLOL SUCCINATE ER 25 MG PO TB24: 25.0000 mg | ORAL_TABLET | Freq: Every day | ORAL | 1 refills | Status: DC

## 2017-11-09 MED ORDER — HALOPERIDOL 10 MG PO TABS: 10.0000 mg | ORAL_TABLET | Freq: Every day | ORAL | 1 refills | Status: DC

## 2017-11-09 MED ORDER — DIVALPROEX SODIUM ER 500 MG PO TB24: 500.0000 mg | ORAL_TABLET | Freq: Two times a day (BID) | ORAL | 1 refills | Status: DC

## 2017-11-09 MED ORDER — QUETIAPINE FUMARATE 400 MG PO TABS: 400.0000 mg | ORAL_TABLET | Freq: Every day | ORAL | 1 refills | Status: DC

## 2017-11-09 MED ORDER — ROSUVASTATIN CALCIUM 10 MG PO TABS: 10.0000 mg | ORAL_TABLET | Freq: Every day | ORAL | 1 refills | Status: DC

## 2017-11-09 MED ORDER — TRAZODONE HCL 50 MG PO TABS: 50.0000 mg | ORAL_TABLET | Freq: Every evening | ORAL | 1 refills | Status: DC | PRN

## 2017-11-09 NOTE — Discharge Summary (Addendum)
Physician Discharge Summary Note  Patient:  Austin Rhodes is an 67 y.o., male MRN:  [id number] DOB:  [birth year] Patient phone:  [phone] (home)  Patient address:   [address] [place] [address],  Total Time spent with patient: 20 minutes plus 15 min on care coordination and documentation  Date of Admission:  10/31/2017 Date of Discharge: 11/10/2017  Reason for Admission:  Psychotic break.  History of Present Illness:   Identifying data. [name] is a 67 year old male with a history of bipolar disorder.  Chief complaint. "I have a lot of famous friends."  History of present illness. Information was obtained from the patient and the chart. The patient was brought to the ER for bizarre behavior in the course of manic episode most likely resulting in medication administration. Apparently, the patient has been stable on a combination of Seroquel, Lamictal and Depakote since his last hospitalization in [place]. The patient complains that his bubble packs were "out of order". The patient is loud, euphoric, grandiose and very talkative. He has multiple degrees, power, connections and money. 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 Overall, he is pleasantly manic. He is probably better already as today he only has 4 (likely true) and not 10 children with famous women. The patient himself, denies any problesms or symptoms of depression, anxiety, psychosis or substance use.   Per daughter's report, the patient lost his mother not long ago and moved to a new apartment.  Past psychiatric history. History of bipolar. Hospitalized twice at [facility], and recently in [place]. In the care of [facility].  Family psychiatric history. Unknown.  Social history. He is retired and lives independently. 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 His daughter lives locally and is involved.   Principal Problem: Bipolar I disorder, current or most recent episode manic, with psychotic features  [facility]) Discharge Diagnoses: Patient Active Problem List   Diagnosis Date Noted  . Bipolar I disorder, current or most recent episode manic, with psychotic features (HCC) [F31.2] 10/31/2017    Priority: High  . HTN (hypertension) [I10] 11/01/2017  . Chest pain [R07.9] 05/09/2015     Past Medical History:  Past Medical History:  Diagnosis Date  . Bipolar disorder (HCC)   . Coronary artery disease   . GERD (gastroesophageal reflux disease)   . History of multiple strokes   . PTSD (post-traumatic stress disorder)     Past Surgical History:  Procedure Laterality Date  . CARDIAC CATHETERIZATION     Family History: History reviewed. No pertinent family history.   Social History:  Social History   Substance and Sexual Activity  Alcohol Use No     Social History   Substance and Sexual Activity  Drug Use Not on file    Social History   Socioeconomic History  . Marital status: Legally Separated    Spouse name: Not on file  . Number of children: Not on file  . Years of education: Not on file  . Highest education level: Not on file  Occupational History  . Not on file  Social Needs  . Financial resource strain: Not on file  . Food insecurity:    Worry: Not on file    Inability: Not on file  . Transportation needs:    Medical: Not on file    Non-medical: Not on file  Tobacco Use  . Smoking status: Never Smoker  . Smokeless tobacco: Never Used  Substance and Sexual Activity  . Alcohol use: No  .  Drug use: Not on file  . Sexual activity: Not on file  Lifestyle  . Physical activity:    Days per week: Not on file    Minutes per session: Not on file  . Stress: Not on file  Relationships  . Social connections:    Talks on phone: Not on file    Gets together: Not on file    Attends religious service: Not on file    Active member of club or organization: Not on file    Attends meetings of clubs or organizations: Not on file    Relationship status: Not on file   Other Topics Concern  . Not on file  Social History Narrative  . Not on file    Hospital Course:     [name] is a 67 year old male with a history of bipolar disorder admitted for a manic episode. He tolerated medication adjustment well. We, reluctantly, added a second antipsychotic to address persisting paranoid delusions. At the time of discharge, the patient is only mildly delusional.   #Mood/psychosis, improved -continue Depakote 500 mg BID, VPA level 52 -continue Lamictal 200 mg nightly   -continueSeroquel400 mg nightly -continue Haldol 10 mg nightly -Trazodone 50 mg nightly PRN  #HTN -Metoprolol 25 mg daily -Crestor 10 mg daily  #Fall risk -daughter report one fall at home in February 2019 -PT consult  #Labs -lipid panelnormal, TSHnormal, A1C -EKGreviewed, sinus tachycardia with QTc 431  #Disposition -discharge to home -follow up with [facility]   Physical Findings: AIMS: Facial and Oral Movements Muscles of Facial Expression: None, normal Lips and Perioral Area: None, normal Jaw: None, normal Tongue: None, normal,Extremity Movements Upper (arms, wrists, hands, fingers): None, normal Lower (legs, knees, ankles, toes): None, normal, Trunk Movements Neck, shoulders, hips: None, normal, Overall Severity Severity of abnormal movements (highest score from questions above): None, normal Incapacitation due to abnormal movements: None, normal Patient's awareness of abnormal movements (rate only patient's report): No Awareness, Dental Status Current problems with teeth and/or dentures?: No Does patient usually wear dentures?: No  CIWA:  CIWA-Ar Total: 2 COWS:  COWS Total Score: 3  Musculoskeletal: Strength & Muscle Tone: within normal limits Gait & Station: normal Patient leans: N/A  Psychiatric Specialty Exam: Physical Exam  Nursing note and vitals reviewed. Psychiatric: He has a normal mood and affect. His speech is normal and behavior is  normal. Thought content is delusional. Cognition and memory are normal. He expresses impulsivity.    Review of Systems  Neurological: Negative.   Psychiatric/Behavioral: Negative.   All other systems reviewed and are negative.   Blood pressure 117/83, pulse 97, temperature 97.8 F (36.6 C), temperature source Oral, resp. rate 18, height 5\' 9"  (1.753 m), weight 85.3 kg, SpO2 93 %.Body mass index is 27.76 kg/m.  General Appearance: Casual  Eye Contact:  Good  Speech:  Clear and Coherent  Volume:  Normal  Mood:  Euphoric  Affect:  Appropriate  Thought Process:  Goal Directed and Descriptions of Associations: Intact  Orientation:  Full (Time, Place, and Person)  Thought Content:  Delusions  Suicidal Thoughts:  No  Homicidal Thoughts:  No  Memory:  Immediate;   Fair Recent;   Fair Remote;   Fair  Judgement:  Impaired  Insight:  Shallow  Psychomotor Activity:  Normal  Concentration:  Concentration: Fair and Attention Span: Fair  Recall:  [employer] of Knowledge:  Fair  Language:  Fair  Akathisia:  No  Handed:  Right  AIMS (if indicated):  Assets:  Communication Skills Desire for Improvement Financial Resources/Insurance Housing Physical Health Resilience Social Support  ADL's:  Intact  Cognition:  WNL  Sleep:  Number of Hours: 6.15     Have you used any form of tobacco in the last 30 days? (Cigarettes, Smokeless Tobacco, Cigars, and/or Pipes): No  Has this patient used any form of tobacco in the last 30 days? (Cigarettes, Smokeless Tobacco, Cigars, and/or Pipes) Yes, No  Blood Alcohol level:  Lab Results  Component Value Date   ETH <10 10/29/2017   ETH <10 03/31/2017    Metabolic Disorder Labs:  Lab Results  Component Value Date   HGBA1C 5.5 11/03/2017   MPG 111 11/03/2017   MPG 123 01/29/2009   No results found for: PROLACTIN Lab Results  Component Value Date   CHOL 133 11/03/2017   TRIG 148 11/03/2017   HDL 44 11/03/2017   CHOLHDL 3.0 11/03/2017    VLDL 30 11/03/2017   LDLCALC 59 11/03/2017   LDLCALC 39 05/10/2015    See Psychiatric Specialty Exam and Suicide Risk Assessment completed by Attending Physician prior to discharge.  Discharge destination:  Home  Is patient on multiple antipsychotic therapies at discharge:  Yes,   Do you recommend tapering to monotherapy for antipsychotics?  Yes   Has Patient had three or more failed trials of antipsychotic monotherapy by history:  No  Recommended Plan for Multiple Antipsychotic Therapies: Taper to monotherapy as described:  discontinue Haldol as appropriate  Discharge Instructions    Diet - low sodium heart healthy   Complete by:  As directed    Increase activity slowly   Complete by:  As directed      Allergies as of 11/10/2017   No Known Allergies     Medication List    STOP taking these medications   SEROQUEL XR 200 MG 24 hr tablet Generic drug:  QUEtiapine Replaced by:  QUEtiapine 400 MG tablet     TAKE these medications     Indication  B COMPLETE PO Take 1 tablet by mouth daily.  Indication:  general health   clopidogrel 75 MG tablet Commonly known as:  PLAVIX Take 1 tablet (75 mg total) by mouth daily.  Indication:  Stroke caused by a Blood Clot   divalproex 500 MG 24 hr tablet Commonly known as:  DEPAKOTE ER Take 1 tablet (500 mg total) by mouth 2 (two) times daily.  Indication:  Manic Phase of Manic-Depression   haloperidol 10 MG tablet Commonly known as:  HALDOL Take 1 tablet (10 mg total) by mouth at bedtime.  Indication:  Psychosis   lamoTRIgine 200 MG tablet Commonly known as:  LAMICTAL Take 1 tablet (200 mg total) by mouth daily.  Indication:  Manic-Depression   metoprolol succinate 25 MG 24 hr tablet Commonly known as:  TOPROL-XL Take 1 tablet (25 mg total) by mouth daily.  Indication:  High Blood Pressure Disorder   multivitamin with minerals Tabs tablet Take 1 tablet by mouth daily.  Indication:  general health   QUEtiapine 400 MG  tablet Commonly known as:  SEROQUEL Take 1 tablet (400 mg total) by mouth at bedtime. Replaces:  SEROQUEL XR 200 MG 24 hr tablet  Indication:  Manic Phase of Manic-Depression   rosuvastatin 10 MG tablet Commonly known as:  CRESTOR Take 1 tablet (10 mg total) by mouth daily.  Indication:  High Amount of Fats in the Blood   traZODone 50 MG tablet Commonly known as:  DESYREL Take 1 tablet (  50 mg total) by mouth at bedtime as needed for sleep.  Indication:  Trouble Sleeping      Follow-up Information    [facility], [facility]. Go on 11/17/2017.   Why:  11/17/17 for 9:30am for Hospital follow up. Contact information: [address] [place] [address] [phone]           Follow-up recommendations:  Activity:  as toleratedas tolerated Diet:  low sodium heart healthy Other:  keep follow up appointments  Comments:    Signed: Kristine Linea, MD 11/10/2017, 11:43 AM

## 2017-11-09 NOTE — B[facility] Group Notes (Signed)
LCSW Group Therapy Note   11/08/2017 1:00pm   Type of Therapy and Topic:  Group Therapy:  Overcoming Obstacles   Participation Level:  Did Not Attend   Description of Group:    In this group patients will be encouraged to explore what they see as obstacles to their own wellness and recovery. They will be guided to discuss their thoughts, feelings, and behaviors related to these obstacles. The group will process together ways to cope with barriers, with attention given to specific choices patients can make. Each patient will be challenged to identify changes they are motivated to make in order to overcome their obstacles. This group will be process-oriented, with patients participating in exploration of their own experiences as well as giving and receiving support and challenge from other group members.   Therapeutic Goals: 1. Patient will identify personal and current obstacles as they relate to admission. 2. Patient will identify barriers that currently interfere with their wellness or overcoming obstacles.  3. Patient will identify feelings, thought process and behaviors related to these barriers. 4. Patient will identify two changes they are willing to make to overcome these obstacles:      Summary of Patient Progress      Therapeutic Modalities:   Cognitive Behavioral Therapy Solution Focused Therapy Motivational Interviewing Relapse Prevention Therapy  Glennon Mac, LCSW 11/09/2017 1:03 PM

## 2017-11-09 NOTE — Plan of Care (Addendum)
Patient found in common area upon my arrival. Patient is visible but not social this evening. Patient reports mood is "good," affect congruent and animated. Denies all complaints including SI/HI/AVH. Reports eating and voiding adequately. Speech continues to be less and less tangential and pressured. No delusional thinking voiced. Denies pain. Patient is smiling, appropriate, and polite. Compliant with HS medications and staff direction. Q 15 minute checks maintained. Will continue to monitor throughout the shift. Patient slept 7.25 hours. No apparent distress. Will endorse care to oncoming shift.  Problem: Coping: Goal: Coping ability will improve Outcome: Progressing Goal: Will verbalize feelings Outcome: Progressing   Problem: Coping: Goal: Coping ability will improve Outcome: Progressing   Problem: Education: Goal: Ability to state activities that reduce stress will improve Outcome: Progressing   Problem: Self-Concept: Goal: Level of anxiety will decrease Outcome: Progressing

## 2017-11-09 NOTE — [facility] Suicide Risk Assessment (Signed)
[facility] Discharge Suicide Risk Assessment   Principal Problem: Bipolar I disorder, current or most recent episode manic, with psychotic features [facility]) Discharge Diagnoses:  Patient Active Problem List   Diagnosis Date Noted  . Bipolar I disorder, current or most recent episode manic, with psychotic features (HCC) [F31.2] 10/31/2017    Priority: High  . HTN (hypertension) [I10] 11/01/2017  . Chest pain [R07.9] 05/09/2015    Total Time spent with patient: 20 minutes  Musculoskeletal: Strength & Muscle Tone: within normal limits Gait & Station: normal Patient leans: N/A  Psychiatric Specialty Exam: Review of Systems  Neurological: Negative.   Psychiatric/Behavioral: Negative.   All other systems reviewed and are negative.   Blood pressure 117/83, pulse 97, temperature 97.8 F (36.6 C), temperature source Oral, resp. rate 18, height 5\' 9"  (1.753 m), weight 85.3 kg, SpO2 93 %.Body mass index is 27.76 kg/m.  General Appearance: Casual  Eye Contact::  Good  Speech:  Clear and Coherent409  Volume:  Normal  Mood:  Euphoric  Affect:  Appropriate  Thought Process:  Goal Directed and Descriptions of Associations: Intact  Orientation:  Full (Time, Place, and Person)  Thought Content:  Delusions  Suicidal Thoughts:  No  Homicidal Thoughts:  No  Memory:  Immediate;   Fair Recent;   Fair Remote;   Fair  Judgement:  Impaired  Insight:  Shallow  Psychomotor Activity:  Normal  Concentration:  Fair  Recall:  [employer] of Knowledge:Fair  Language: Fair  Akathisia:  No  Handed:  Right  AIMS (if indicated):     Assets:  Communication Skills Desire for Improvement Financial Resources/Insurance Housing Physical Health Resilience Social Support  Sleep:  Number of Hours: 6.15  Cognition: WNL  ADL's:  Intact   Mental Status Per Nursing Assessment::   On Admission:  Self-harm behaviors  Demographic Factors:  Male, Age 65 or older, Divorced or widowed, Cardell Peach, lesbian, or bisexual  orientation and Living alone  Loss Factors: Loss of significant relationship  Historical Factors: Impulsivity  Risk Reduction Factors:   Sense of responsibility to family, Positive social support and Positive therapeutic relationship  Continued Clinical Symptoms:  Bipolar Disorder:   Mixed State  Cognitive Features That Contribute To Risk:  None    Suicide Risk:  Minimal: No identifiable suicidal ideation.  Patients presenting with no risk factors but with morbid ruminations; may be classified as minimal risk based on the severity of the depressive symptoms  Follow-up Information    [facility], [facility]. Go on 11/17/2017.   Why:  11/17/17 for 9:30am for Hospital follow up. Contact information: [address] [place] [address] [phone]           Plan Of Care/Follow-up recommendations:  Activity:  as tolerated Diet:  low sodium heart healthy Other:  keep follow up appointments  Kristine Linea, MD 11/10/2017, 9:14 AM

## 2017-11-09 NOTE — Plan of Care (Signed)
Patient is knowledge of information received  able to verbalize understanding of information received . Emotional and mental status improved . Voice no concerns around sleep  . No issues  with self control at present .  Continued programing at present  time . Working on coping concerns  Leaflet given. Limited insight on lifestyle changes at present   No safety Concerns  voiced . Limited insight into  what is promoting  anxiety or ares that  increase stress  levels  Problem: Safety: Goal: Ability to disclose and discuss suicidal ideas will improve Outcome: Progressing   Problem: Coping: Goal: Coping ability will improve Outcome: Progressing   Problem: Coping: Goal: Will verbalize feelings Outcome: Progressing   Problem: Coping: Goal: Will verbalize feelings Outcome: Progressing   Problem: Coping: Goal: Coping ability will improve Outcome: Progressing   Problem: Activity: Goal: Will verbalize the importance of balancing activity with adequate rest periods Outcome: Progressing   Problem: Activity: Goal: Will verbalize the importance of balancing activity with adequate rest periods Outcome: Progressing   Problem: Coping: Goal: Coping ability will improve Outcome: Progressing Goal: Will verbalize feelings Outcome: Progressing   Problem: Coping: Goal: Coping ability will improve Outcome: Progressing Goal: Will verbalize feelings Outcome: Progressing   Problem: Safety: Goal: Ability to disclose and discuss suicidal ideas will improve Outcome: Progressing Goal: Ability to identify and utilize support systems that promote safety will improve Outcome: Progressing   Problem: Education: Goal: Ability to state activities that reduce stress will improve Outcome: Progressing   Problem: Self-Concept: Goal: Ability to identify factors that promote anxiety will improve Outcome: Progressing Goal: Level of anxiety will decrease Outcome: Progressing Goal: Ability to modify  response to factors that promote anxiety will improve Outcome: Progressing

## 2017-11-09 NOTE — Progress Notes (Signed)
D: Patient stated slept good last night .Stated appetite is good and energy level  Is normal. Stated concentration is good . Stated on Depression scale 0 , hopeless 0 and anxiety 0 .( low 0-10 high) Denies suicidal  homicidal ideations  .  No auditory hallucinations  No pain concerns . Appropriate ADL'S. Interacting with peers and staff. Patient is knowledge of information received  able to verbalize understanding of information received . Emotional and mental status improved . Voice no concerns around sleep  . No issues  with self control at present .  Limited  programing at present  time . Working on coping concerns   given. Limited insight on lifestyle changes at present   No safety Concerns  voiced . Limited insight into  what is promoting  anxiety or ares that  increase stress  levels   A: Encourage patient participation with unit programming . Instruction  Given on  Medication , verbalize understanding.  R: Voice no other concerns. Staff continue to monitor

## 2017-11-09 NOTE — [facility] Group Notes (Signed)
11/09/2017 1PM  Type of Therapy/Topic:  Group Therapy:  Feelings about Diagnosis  Participation Level:  Active   Description of Group:   This group will allow patients to explore their thoughts and feelings about diagnoses they have received. Patients will be guided to explore their level of understanding and acceptance of these diagnoses. Facilitator will encourage patients to process their thoughts and feelings about the reactions of others to their diagnosis and will guide patients in identifying ways to discuss their diagnosis with significant others in their lives. This group will be process-oriented, with patients participating in exploration of their own experiences, giving and receiving support, and processing challenge from other group members.   Therapeutic Goals: 1. Patient will demonstrate understanding of diagnosis as evidenced by identifying two or more symptoms of the disorder 2. Patient will be able to express two feelings regarding the diagnosis 3. Patient will demonstrate their ability to communicate their needs through discussion and/or role play  Summary of Patient Progress: Actively engaged in the group. Patient was able to provide support and validation to other group members.Patient practiced active listening when interacting with the facilitator and other group members. At times the patient needed to be redirected. He would get on a rant about having whistel blower syndrome and water pollution.He also spoke about how the reasons that people with PTSD are killing themselves and others is because the psychiatrist not caring to treat them proper medically. Patient is still in the process of obtaining treatment goals.         Therapeutic Modalities:   Cognitive Behavioral Therapy Brief Therapy Feelings Identification    Johny Shears, LCSW 11/09/2017 3:40 PM

## 2017-11-10 NOTE — Progress Notes (Signed)
Recreation Therapy Notes  Date: 11/10/2017  Time: 9:30 am   Location: Craft room   Behavioral response: N/A   Intervention Topic: Relaxation  Discussion/Intervention: Patient did not attend group.   Clinical Observations/Feedback:  Patient did not attend group.   [name] LRT/CTRS        Austin Rhodes 11/10/2017 12:16 PM 

## 2017-11-10 NOTE — Progress Notes (Signed)
Patient ID: Austin Rhodes, male   DOB: May 30, 1950, 67 y.o.   MRN: [id number] CSW spoke with Pt's daughter Austin Rhodes who will plan to pick him up after she gets off work around 6pm.  She inquired about PT and a nurse to help with his medications in the morning. CSW informed her that PT was consulted today and per their notes they do not recommend PT at this time.  She also asked about having someone come in and help him with taking his medication in the morning. CSW agreed to provide a list of providers that may be able to help with this.    Jake Shark, LCSW

## 2017-11-10 NOTE — Tx Team (Signed)
Interdisciplinary Treatment and Diagnostic Plan Update  11/10/2017 Time of Session: 10:50 am Austin Rhodes MRN: [id number]  Principal Diagnosis: Bipolar I disorder, current or most recent episode manic, with psychotic features (HCC)  Secondary Diagnoses: Principal Problem:   Bipolar I disorder, current or most recent episode manic, with psychotic features (HCC) Active Problems:   HTN (hypertension)   Current Medications:  Current Facility-Administered Medications  Medication Dose Route Frequency Provider Last Rate Last Dose  . acetaminophen (TYLENOL) tablet 650 mg  650 mg Oral Q6H PRN Beverly Sessions, MD      . alum & mag hydroxide-simeth (MAALOX/MYLANTA) 200-200-20 MG/5ML suspension 30 mL  30 mL Oral Q4H PRN Beverly Sessions, MD      . divalproex (DEPAKOTE) DR tablet 500 mg  500 mg Oral BID Pucilowska, [name], MD   500 mg at 11/10/17 0759  . haloperidol (HALDOL) tablet 10 mg  10 mg Oral QHS Pucilowska, [name], MD   10 mg at 11/09/17 2136  . lamoTRIgine (LAMICTAL) tablet 200 mg  200 mg Oral QHS Pucilowska, [name], MD   200 mg at 11/09/17 2135  . magnesium hydroxide (MILK OF MAGNESIA) suspension 30 mL  30 mL Oral Daily PRN Beverly Sessions, MD      . metoprolol succinate (TOPROL-XL) 24 hr tablet 25 mg  25 mg Oral Daily Beverly Sessions, MD   25 mg at 11/10/17 0759  . pneumococcal 23 valent vaccine (PNU-IMMUNE) injection 0.5 mL  0.5 mL Intramuscular Tomorrow-1000 Pucilowska, [name], MD      . QUEtiapine (SEROQUEL) tablet 400 mg  400 mg Oral QHS Pucilowska, [name], MD   400 mg at 11/09/17 2135  . rosuvastatin (CRESTOR) tablet 10 mg  10 mg Oral Daily Beverly Sessions, MD   10 mg at 11/09/17 1716  . traZODone (DESYREL) tablet 50 mg  50 mg Oral QHS PRN Beverly Sessions, MD   50 mg at 11/08/17 2146   PTA Medications: Medications Prior to Admission  Medication Sig Dispense Refill Last Dose  . B Complex-Biotin-FA (B COMPLETE PO) Take 1 tablet by mouth daily.   Not  Taking at Unknown time  . clopidogrel (PLAVIX) 75 MG tablet Take 1 tablet (75 mg total) by mouth daily. (Patient not taking: Reported on 10/29/2017) 30 tablet 3 Not Taking at Unknown time  . Multiple Vitamin (MULTIVITAMIN WITH MINERALS) TABS tablet Take 1 tablet by mouth daily.   Not Taking at Unknown time  . SEROQUEL XR 200 MG 24 hr tablet Take 200 mg by mouth at bedtime.   0 Not Taking at Unknown time  . [DISCONTINUED] divalproex (DEPAKOTE ER) 500 MG 24 hr tablet Take 500 mg by mouth 2 (two) times daily.  0 Not Taking at Unknown time  . [DISCONTINUED] lamoTRIgine (LAMICTAL) 200 MG tablet Take 200 mg by mouth daily.  0 Not Taking at Unknown time  . [DISCONTINUED] metoprolol succinate (TOPROL-XL) 25 MG 24 hr tablet Take 25 mg by mouth daily.   Not Taking at Unknown time  . [DISCONTINUED] rosuvastatin (CRESTOR) 10 MG tablet Take 10 mg by mouth daily.   Not Taking at Unknown time    Patient Stressors: Financial difficulties Health problems Medication change or noncompliance Occupational concerns  Patient Strengths: Capable of independent living Motivation for treatment/growth Physical Health  Treatment Modalities: Medication Management, Group therapy, Case management,  1 to 1 session with clinician, Psychoeducation, Recreational therapy.   Physician Treatment Plan for Primary Diagnosis: Bipolar I disorder, current or most recent episode manic,  with psychotic features (HCC) Long Term Goal(s): Improvement in symptoms so as ready for discharge Improvement in symptoms so as ready for discharge   Short Term Goals: Ability to identify changes in lifestyle to reduce recurrence of condition will improve Ability to verbalize feelings will improve Ability to disclose and discuss suicidal ideas Ability to demonstrate self-control will improve Ability to identify and develop effective coping behaviors will improve Ability to maintain clinical measurements within normal limits will  improve Compliance with prescribed medications will improve Ability to identify triggers associated with substance abuse/mental health issues will improve NA  Medication Management: Evaluate patient's response, side effects, and tolerance of medication regimen.  Therapeutic Interventions: 1 to 1 sessions, Unit Group sessions and Medication administration.  Evaluation of Outcomes: Progressing  Physician Treatment Plan for Secondary Diagnosis: Principal Problem:   Bipolar I disorder, current or most recent episode manic, with psychotic features (HCC) Active Problems:   HTN (hypertension)  Long Term Goal(s): Improvement in symptoms so as ready for discharge Improvement in symptoms so as ready for discharge   Short Term Goals: Ability to identify changes in lifestyle to reduce recurrence of condition will improve Ability to verbalize feelings will improve Ability to disclose and discuss suicidal ideas Ability to demonstrate self-control will improve Ability to identify and develop effective coping behaviors will improve Ability to maintain clinical measurements within normal limits will improve Compliance with prescribed medications will improve Ability to identify triggers associated with substance abuse/mental health issues will improve NA     Medication Management: Evaluate patient's response, side effects, and tolerance of medication regimen.  Therapeutic Interventions: 1 to 1 sessions, Unit Group sessions and Medication administration.  Evaluation of Outcomes: Progressing   RN Treatment Plan for Primary Diagnosis: Bipolar I disorder, current or most recent episode manic, with psychotic features (HCC) Long Term Goal(s): Knowledge of disease and therapeutic regimen to maintain health will improve  Short Term Goals: Ability to identify and develop effective coping behaviors will improve and Compliance with prescribed medications will improve  Medication Management: RN will  administer medications as ordered by provider, will assess and evaluate patient's response and provide education to patient for prescribed medication. RN will report any adverse and/or side effects to prescribing provider.  Therapeutic Interventions: 1 on 1 counseling sessions, Psychoeducation, Medication administration, Evaluate responses to treatment, Monitor vital signs and CBGs as ordered, Perform/monitor CIWA, COWS, AIMS and Fall Risk screenings as ordered, Perform wound care treatments as ordered.  Evaluation of Outcomes: Progressing   LCSW Treatment Plan for Primary Diagnosis: Bipolar I disorder, current or most recent episode manic, with psychotic features (HCC) Long Term Goal(s): Safe transition to appropriate next level of care at discharge, Engage patient in therapeutic group addressing interpersonal concerns.  Short Term Goals: Engage patient in aftercare planning with referrals and resources, Identify triggers associated with mental health/substance abuse issues and Increase skills for wellness and recovery  Therapeutic Interventions: Assess for all discharge needs, 1 to 1 time with Social worker, Explore available resources and support systems, Assess for adequacy in community support network, Educate family and significant other(s) on suicide prevention, Complete Psychosocial Assessment, Interpersonal group therapy.  Evaluation of Outcomes: Progressing   Progress in Treatment: Attending groups: Yes. Participating in groups: No. Taking medication as prescribed: Yes. Toleration medication: Yes. Family/Significant other contact made: Yes, individual(s) contacted:  Pt's daughter, Blythe Hartshorn, has been contacted. Patient understands diagnosis: Yes. Discussing patient identified problems/goals with staff: Yes. Medical problems stabilized or resolved: Yes. Denies suicidal/homicidal ideation: Yes.  Issues/concerns per patient self-inventory: Yes. Other:    New problem(s)  identified: No, Describe:     New Short Term/Long Term Goal(s):  Patient Goals:  TBD, Pt unable to really state as difficult to redirect for delusions of grandiosity   Discharge Plan or Barriers: Tentative plan for pt to return to his home and resume outpatient psychiatric services.  Pt receives good support from his daughter.  Pt is doing better (better hygiene, coming out of his room), but still struggles with delusional thinking.  Reason for Continuation of Hospitalization: Delusions  Medication stabilization  Coordination of aftercare plan  Estimated Length of Stay:5-7  Recreational Therapy: Patient Stressors: N/A Patient Goal: Patient will focus on task/topic with 2 prompts from staff within 5 recreation therapy group sessions  Attendees: Patient: 11/10/2017 2:47 PM  Physician: Braulio Conte Pucilowska 11/10/2017 2:47 PM  Nursing: Hulan Amato, RN 11/10/2017 2:47 PM  RN Care Manager: 11/10/2017 2:47 PM  Social Worker: Jake Shark, LCSW 11/10/2017 2:47 PM  Recreational Therapist: Garret Reddish, LRT 11/10/2017 2:47 PM  Other: Johny Shears, LCSWA 11/10/2017 2:47 PM  Other:  11/10/2017 2:47 PM  Other: 11/10/2017 2:47 PM    Scribe for Treatment Team: Glennon Mac, LCSW 11/10/2017 2:47 PM

## 2017-11-10 NOTE — Progress Notes (Signed)
  [facility] Adult Case Management Discharge Plan :  Will you be returning to the same living situation after discharge:  Yes,    At discharge, do you have transportation home?: Yes,    Do you have the ability to pay for your medications: Yes,     Release of information consent forms completed and in the chart;  Patient's signature needed at discharge.  Patient to Follow up at: Follow-up Information    Pc, [facility]. Go on 11/17/2017.   Why:  11/17/17 for 9:30am for Hospital follow up. Contact information: [address] [place] [address] [phone]           Next level of care provider has access to [facility] Link:no  Safety Planning and Suicide Prevention discussed: Yes,     Have you used any form of tobacco in the last 30 days? (Cigarettes, Smokeless Tobacco, Cigars, and/or Pipes): No  Has patient been referred to the Quitline?: N/A patient is not a smoker  Patient has been referred for addiction treatment: Yes  Glennon Mac, LCSW 11/10/2017, 1:39 PM

## 2017-11-10 NOTE — Progress Notes (Signed)
Recreation Therapy Notes  INPATIENT RECREATION TR PLAN  Patient Details Name: Austin Rhodes MRN: [id number] DOB: 01-22-1951 Today's Date: 11/10/2017  Rec Therapy Plan Is patient appropriate for Therapeutic Recreation?: Yes Treatment times per week: at least 3 Estimated Length of Stay: 5-7 days TR Treatment/Interventions: Group participation (Comment)  Discharge Criteria Pt will be discharged from therapy if:: Discharged Treatment plan/goals/alternatives discussed and agreed upon by:: Patient/family  Discharge Summary Short term goals set: Patient will focus on task/topic with 2 prompts from staff within 5 recreation therapy group sessions Short term goals met: Adequate for discharge Progress toward goals comments: Groups attended Which groups?: Coping skills, Other (Comment)(Problem solving, team work) Reason goals not met: N/A Therapeutic equipment acquired: N/A Reason patient discharged from therapy: Discharge from hospital Pt/family agrees with progress & goals achieved: Yes Date patient discharged from therapy: 11/10/17   Daniah Zaldivar 11/10/2017, 3:54 PM

## 2017-11-10 NOTE — Plan of Care (Signed)
Patient is cognitively grandiose of delusions, states that he is the world greatest philanthropist,    the food supply chain comes from me, patient is non aggressive , compliant and calm , denies any SI /HI  and no signs of AVH, sleep long hours with any interreactions and without any PRN, no concern voiced by patinet. 15 minutrs safety rounding  is maintained..   Problem: Activity: Goal: Will verbalize the importance of balancing activity with adequate rest periods Outcome: Progressing   Problem: Coping: Goal: Coping ability will improve Outcome: Progressing Goal: Will verbalize feelings Outcome: Progressing   Problem: Coping: Goal: Coping ability will improve Outcome: Progressing Goal: Will verbalize feelings Outcome: Progressing   Problem: Safety: Goal: Ability to disclose and discuss suicidal ideas will improve Outcome: Progressing Goal: Ability to identify and utilize support systems that promote safety will improve Outcome: Progressing   Problem: Education: Goal: Ability to state activities that reduce stress will improve Outcome: Progressing   Problem: Self-Concept: Goal: Ability to identify factors that promote anxiety will improve Outcome: Progressing Goal: Level of anxiety will decrease Outcome: Progressing Goal: Ability to modify response to factors that promote anxiety will improve Outcome: Progressing

## 2017-11-10 NOTE — Progress Notes (Signed)
  [facility] Adult Case Management Discharge Plan :  Will you be returning to the same living situation after discharge:  Yes,    At discharge, do you have transportation home?: Yes,    Do you have the ability to pay for your medications: Yes,     Release of information consent forms completed and in the chart;  Patient's signature needed at discharge.  Patient to Follow up at: Follow-up Information    Pc, [facility]. Go on 11/17/2017.   Why:  11/17/17 for 9:30am for Hospital follow up. Contact information: [address] [place] [address] [phone]           Next level of care provider has access to [facility] Link:no  Safety Planning and Suicide Prevention discussed: Yes,     Have you used any form of tobacco in the last 30 days? (Cigarettes, Smokeless Tobacco, Cigars, and/or Pipes): No  Has patient been referred to the Quitline?: N/A patient is not a smoker  Patient has been referred for addiction treatment: Yes  Glennon Mac, LCSW 11/10/2017, 9:14 AM

## 2017-11-10 NOTE — Progress Notes (Signed)
D: Patient is aware of  Discharge this shift .Patient denies suicidal /homicidal ideations. Patient received all belongings brought in  A: No Storage medications. Writer reviewed Discharge Summary, Suicide Risk Assessment, and Transitional Record. Patient also received Prescriptions   from  MD. Aware  Of follow up appointment . R: Patient left unit with no questions  Or concerns  With daugther 

## 2017-11-10 NOTE — Progress Notes (Signed)
Physical Therapy Evaluation Patient Details Name: MACARI ZALESKY MRN: [id number] DOB: 1950-04-27 Today's Date: 11/10/2017   History of Present Illness  Information was obtained from the patient and the chart. The patient was brought to the ER for bizarre behavior in the course of manic episode most likely resulting in medication administration. Apparently, the patient has been stable on a combination of Seroquel, Lamictal and Depakote since his last hospitalization in [place]. The patient complains that his bubble packs were "out of order". The patient is loud, euphoric, grandiose and very talkative. He has multiple degrees, power, connections and money. Overall, he is pleasantly manic. He is probably better already as today he only has 4 (likely true) and not 10 children with famous women. The patient himself, denies any problesms or symptoms of depression, anxiety, psychosis or substance use. He is currently admitted for bipolar disorder and has a discharge order pending.   Clinical Impression  Pt is independent with bed mobility, transfers, and ambulation. He is able to ambulate extensively on the unit without assistive device. Gait speed is [facility] and no need for external assist for balance. He is able to perform horizontal and vertical head turns without instability. Pt has some higher level balance deficits in single leg stance but not in need of immediate physical therapy intervention. Pt is provided with some basic balance exercises he can perform at home. If necessary PCP can refer for additional balance therapy in the future. No further PT needs at this time. Pt is safe to return home. Order completed.     Follow Up Recommendations No PT follow up    Equipment Recommendations  None recommended by PT    Recommendations for Other Services       Precautions / Restrictions Precautions Precautions: Fall Restrictions Weight Bearing Restrictions: No      Mobility  Bed  Mobility Overal bed mobility: Independent             General bed mobility comments: Good speed and sequencing  Transfers Overall transfer level: Independent               General transfer comment: Good speed and sequencing. No concerns noted  Ambulation/Gait Ambulation/Gait assistance: Independent Gait Distance (Feet): 250 Feet Assistive device: None       General Gait Details: Pt is able to ambulate extensively on the unit without assistive device. Gait speed is [facility] and no need for external assist for balance. He is able to perform horizontal and vertical head turns without instability.  Stairs            Wheelchair Mobility    Modified Rankin (Stroke Patients Only)       Balance Overall balance assessment: Needs assistance Sitting-balance support: No upper extremity supported Sitting balance-Leahy Scale: Normal     Standing balance support: No upper extremity supported Standing balance-Leahy Scale: Good Standing balance comment: Negative Romberg. Single leg balance is only 2-3 seconds on each side                             Pertinent Vitals/Pain Pain Assessment: No/denies pain    Home Living Family/patient expects to be discharged to:: Private residence Living Arrangements: Alone Available Help at Discharge: Family Type of Home: Apartment Home Access: Level entry     Home Layout: One level Home Equipment: None      Prior Function Level of Independence: Independent  Comments: Independent with ADLs/IADLs. No falls. Full community ambulator without assistive device. Drives     Hand Dominance   Dominant Hand: Right    Extremity/Trunk Assessment   Upper Extremity Assessment Upper Extremity Assessment: Overall WFL for tasks assessed    Lower Extremity Assessment Lower Extremity Assessment: Overall WFL for tasks assessed       Communication   Communication: No difficulties  Cognition Arousal/Alertness:  Awake/alert Behavior During Therapy: WFL for tasks assessed/performed Overall Cognitive Status: Within Functional Limits for tasks assessed                                 General Comments: Pt is calm and pleasant throughout assessment      General Comments      Exercises     Assessment/Plan    PT Assessment Patent does not need any further PT services  PT Problem List         PT Treatment Interventions      PT Goals (Current goals can be found in the Care Plan section)  Acute Rehab PT Goals PT Goal Formulation: All assessment and education complete, DC therapy    Frequency     Barriers to discharge        Co-evaluation               AM-PAC PT "6 Clicks" Daily Activity  Outcome Measure Difficulty turning over in bed (including adjusting bedclothes, sheets and blankets)?: None Difficulty moving from lying on back to sitting on the side of the bed? : None Difficulty sitting down on and standing up from a chair with arms (e.g., wheelchair, bedside commode, etc,.)?: None Help needed moving to and from a bed to chair (including a wheelchair)?: None Help needed walking in hospital room?: None Help needed climbing 3-5 steps with a railing? : None 6 Click Score: 24    End of Session   Activity Tolerance: Patient tolerated treatment well Patient left: in chair   PT Visit Diagnosis: Unsteadiness on feet (R26.81)    Time: 1610-9604 PT Time Calculation (min) (ACUTE ONLY): 10 min   Charges:   PT Evaluation $PT Eval Low Complexity: 1 Low          Jason D Huprich PT, DPT, GCS   Huprich,Jason 11/10/2017, 11:50 AM

## 2018-11-25 ENCOUNTER — Encounter: Payer: Self-pay | Admitting: Emergency Medicine

## 2018-11-25 ENCOUNTER — Other Ambulatory Visit: Payer: Self-pay

## 2018-11-25 ENCOUNTER — Emergency Department
Admission: EM | Admit: 2018-11-25 | Discharge: 2018-11-25 | Disposition: A | Discharge status: Home / Self Care | Payer: Medicare Other | Attending: Emergency Medicine | Admitting: Emergency Medicine

## 2018-11-25 DIAGNOSIS — Z5321 Procedure and treatment not carried out due to patient leaving prior to being seen by health care provider: Secondary | ICD-10-CM | POA: Insufficient documentation

## 2018-11-25 DIAGNOSIS — H9201 Otalgia, right ear: Secondary | ICD-10-CM | POA: Diagnosis not present

## 2018-11-25 DIAGNOSIS — R519 Headache, unspecified: Secondary | ICD-10-CM | POA: Diagnosis present

## 2018-11-25 NOTE — ED Triage Notes (Addendum)
Pt presents to ED with concerns that he is having an allergic reaction to the flu shot that he received earlier this afternoon. Pt states he got the "stronger flu shot" and then afterwards read on line that "it can be problematic". Pt states he looked up adverse reactions to the shot and filled out a questionnaire on-line that told him he was having an allergic reaction. Pt states his eyes were watering and he developed a headache behind his eyes. Also c/o sudden onset of right ear pain, bilateral ankle, elbow, and knee pain. Pt states these symptoms were all listed as related to a bad reaction to the flu shot and the Internet website told him to call 911 and be seen by the emergency department. 2 antihistamines and 1000 mg of tylenol taken at home with no relief. Pt currently has no facial swelling or angioedema. Denies sob or chest pain.

## 2018-11-26 NOTE — ED Notes (Signed)
Pt states he is worried he will get COVID  Waiting in the lobby to be seen. Pt states [place] in [place] and he wouldn't want to get sick and get his neighbors sick as well. Pt states it is not worth the risk and he will be leaving to go home.

## 2018-12-16 ENCOUNTER — Other Ambulatory Visit: Payer: Self-pay

## 2018-12-16 ENCOUNTER — Emergency Department: Payer: Medicare Other

## 2018-12-16 ENCOUNTER — Encounter: Payer: Self-pay | Admitting: Emergency Medicine

## 2018-12-16 ENCOUNTER — Emergency Department
Admission: EM | Admit: 2018-12-16 | Discharge: 2018-12-18 | Disposition: A | Discharge status: Other Acute Inpatient Hospital | Payer: Medicare Other | Attending: Emergency Medicine | Admitting: Emergency Medicine

## 2018-12-16 DIAGNOSIS — Z20828 Contact with and (suspected) exposure to other viral communicable diseases: Secondary | ICD-10-CM | POA: Diagnosis not present

## 2018-12-16 DIAGNOSIS — F319 Bipolar disorder, unspecified: Secondary | ICD-10-CM | POA: Diagnosis not present

## 2018-12-16 DIAGNOSIS — Z046 Encounter for general psychiatric examination, requested by authority: Secondary | ICD-10-CM | POA: Diagnosis present

## 2018-12-16 DIAGNOSIS — Z7902 Long term (current) use of antithrombotics/antiplatelets: Secondary | ICD-10-CM | POA: Insufficient documentation

## 2018-12-16 DIAGNOSIS — I10 Essential (primary) hypertension: Secondary | ICD-10-CM | POA: Insufficient documentation

## 2018-12-16 DIAGNOSIS — F312 Bipolar disorder, current episode manic severe with psychotic features: Secondary | ICD-10-CM | POA: Insufficient documentation

## 2018-12-16 DIAGNOSIS — Z79899 Other long term (current) drug therapy: Secondary | ICD-10-CM | POA: Insufficient documentation

## 2018-12-16 DIAGNOSIS — I259 Chronic ischemic heart disease, unspecified: Secondary | ICD-10-CM | POA: Diagnosis not present

## 2018-12-16 LAB — CBC WITH DIFFERENTIAL/PLATELET
Abs Immature Granulocytes: 0.05 10*3/uL (ref 0.00–0.07)
Basophils Absolute: 0 10*3/uL (ref 0.0–0.1)
Basophils Relative: 0 %
Eosinophils Absolute: 0 10*3/uL (ref 0.0–0.5)
Eosinophils Relative: 0 %
HCT: 45.8 % (ref 39.0–52.0)
Hemoglobin: 16 g/dL (ref 13.0–17.0)
Immature Granulocytes: 0 %
Lymphocytes Relative: 18 %
Lymphs Abs: 2.3 10*3/uL (ref 0.7–4.0)
MCH: 29.6 pg (ref 26.0–34.0)
MCHC: 34.9 g/dL (ref 30.0–36.0)
MCV: 84.8 fL (ref 80.0–100.0)
Monocytes Absolute: 1 10*3/uL (ref 0.1–1.0)
Monocytes Relative: 8 %
Neutro Abs: 9.5 10*3/uL — ABNORMAL HIGH (ref 1.7–7.7)
Neutrophils Relative %: 74 %
Platelets: 250 10*3/uL (ref 150–400)
RBC: 5.4 MIL/uL (ref 4.22–5.81)
RDW: 13.6 % (ref 11.5–15.5)
WBC: 12.9 10*3/uL — ABNORMAL HIGH (ref 4.0–10.5)
nRBC: 0 % (ref 0.0–0.2)

## 2018-12-16 LAB — GLUCOSE, CAPILLARY: Glucose-Capillary: 121 mg/dL — ABNORMAL HIGH (ref 70–99)

## 2018-12-16 LAB — COMPREHENSIVE METABOLIC PANEL
ALT: 42 U/L (ref 0–44)
AST: 61 U/L — ABNORMAL HIGH (ref 15–41)
Albumin: 4.4 g/dL (ref 3.5–5.0)
Alkaline Phosphatase: 59 U/L (ref 38–126)
Anion gap: 14 (ref 5–15)
BUN: 24 mg/dL — ABNORMAL HIGH (ref 8–23)
CO2: 21 mmol/L — ABNORMAL LOW (ref 22–32)
Calcium: 9.3 mg/dL (ref 8.9–10.3)
Chloride: 107 mmol/L (ref 98–111)
Creatinine, Ser: 1.16 mg/dL (ref 0.61–1.24)
GFR calc Af Amer: >60 mL/min (ref >60)
GFR calc non Af Amer: >60 mL/min (ref >60)
Glucose, Bld: 134 mg/dL — ABNORMAL HIGH (ref 70–99)
Potassium: 3.5 mmol/L (ref 3.5–5.1)
Sodium: 142 mmol/L (ref 135–145)
Total Bilirubin: 0.8 mg/dL (ref 0.3–1.2)
Total Protein: 7.2 g/dL (ref 6.5–8.1)

## 2018-12-16 LAB — ETHANOL: Alcohol, Ethyl (B): <10 mg/dL (ref <10)

## 2018-12-16 LAB — SALICYLATE LEVEL: Salicylate Lvl: <7 mg/dL (ref 2.8–30.0)

## 2018-12-16 LAB — ACETAMINOPHEN LEVEL: Acetaminophen (Tylenol), Serum: <10 ug/mL — ABNORMAL LOW (ref 10–30)

## 2018-12-16 MED ORDER — HALOPERIDOL LACTATE 5 MG/ML IJ SOLN
5.0000 mg | Freq: Once | INTRAMUSCULAR | Status: AC
  Administered 2018-12-16: 5 mg via INTRAMUSCULAR

## 2018-12-16 MED ORDER — HALOPERIDOL LACTATE 5 MG/ML IJ SOLN
INTRAMUSCULAR | Status: AC
  Administered 2018-12-16: 5 mg
  Filled 2018-12-16: qty 1

## 2018-12-16 MED ORDER — LORAZEPAM 2 MG/ML IJ SOLN
INTRAMUSCULAR | Status: AC
  Administered 2018-12-16: 19:00:00
  Filled 2018-12-16: qty 1

## 2018-12-16 MED ORDER — LORAZEPAM 2 MG/ML IJ SOLN
2.0000 mg | Freq: Once | INTRAMUSCULAR | Status: AC
  Administered 2018-12-16: 2 mg via INTRAMUSCULAR

## 2018-12-16 NOTE — ED Notes (Signed)
Patient sleeping comfortably.  ?

## 2018-12-16 NOTE — ED Notes (Signed)
No changes noted Patient sleeping comfortably

## 2018-12-16 NOTE — ED Notes (Signed)
IVC  FROM  [facility] 

## 2018-12-16 NOTE — ED Provider Notes (Signed)
[facility] Emergency Department Provider Note   ____________________________________________   First MD Initiated Contact with Patient 12/16/18 1850     (approximate)  I have reviewed the triage vital signs and the nursing notes.   HISTORY  Chief Complaint Psychiatric Evaluation   HPI Austin Rhodes is a 68 y.o. male who is a known schizophrenic.  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  He starts cussing and becomes a little violent acting and says he is leaving.  We will have to sedate him.         Past Medical History:  Diagnosis Date  . Bipolar disorder (HCC)   . Coronary artery disease   . GERD (gastroesophageal reflux disease)   . History of multiple strokes   . PTSD (post-traumatic stress disorder)     Patient Active Problem List   Diagnosis Date Noted  . HTN (hypertension) 11/01/2017  . Bipolar I disorder, current or most recent episode manic, with psychotic features (HCC) 10/31/2017  . Chest pain 05/09/2015    Past Surgical History:  Procedure Laterality Date  . CARDIAC CATHETERIZATION      Prior to Admission medications   Medication Sig Start Date End Date Taking? Authorizing Provider  B Complex-Biotin-FA (B COMPLETE PO) Take 1 tablet by mouth daily.    [provider]  clopidogrel (PLAVIX) 75 MG tablet Take 1 tablet (75 mg total) by mouth daily. Patient not taking: Reported on 10/29/2017 05/10/15   Rinaldo Cloud, MD  divalproex (DEPAKOTE ER) 500 MG 24 hr tablet Take 1 tablet (500 mg total) by mouth 2 (two) times daily. 11/09/17   Pucilowska, [name], MD  haloperidol (HALDOL) 10 MG tablet Take 1 tablet (10 mg total) by mouth at bedtime. 11/09/17   Pucilowska, Braulio Conte B, MD  lamoTRIgine (LAMICTAL) 200 MG tablet Take 1 tablet (200 mg total) by mouth daily. 11/09/17   Pucilowska, Braulio Conte B, MD   metoprolol succinate (TOPROL-XL) 25 MG 24 hr tablet Take 1 tablet (25 mg total) by mouth daily. 11/09/17   Pucilowska, Ellin Goodie, MD  Multiple Vitamin (MULTIVITAMIN WITH MINERALS) TABS tablet Take 1 tablet by mouth daily.    [provider]  QUEtiapine (SEROQUEL) 400 MG tablet Take 1 tablet (400 mg total) by mouth at bedtime. 11/09/17   Pucilowska, [name], MD  rosuvastatin (CRESTOR) 10 MG tablet Take 1 tablet (10 mg total) by mouth daily. 11/09/17   Pucilowska, Braulio Conte B, MD  traZODone (DESYREL) 50 MG tablet Take 1 tablet (50 mg total) by mouth at bedtime as needed for sleep. 11/09/17   Pucilowska, Ellin Goodie, MD    Allergies Patient has no known allergies.  History reviewed. No pertinent family history.  Social History Social History   Tobacco Use  . Smoking status: Never Smoker  . Smokeless tobacco: Never Used  Substance Use Topics  . Alcohol use: No  . Drug use: Never    Review of Systems  Constitutional: No fever/chills Eyes: No visual changes. ENT: No sore throat. Cardiovascular: Denies chest pain. Respiratory: Denies shortness of breath. Gastrointestinal: No abdominal pain.  No nausea, no vomiting.  No diarrhea.  No constipation. Genitourinary: Negative for dysuria. Musculoskeletal: Negative for back pain. Skin: Negative for rash. Neurological: Negative for headaches, focal weakness  ____________________________________________   PHYSICAL EXAM:  VITAL SIGNS: ED Triage Vitals  Enc Vitals Group     BP 12/16/18 1803 (!) 161/91     Pulse Rate 12/16/18 1803 (!) 106     Resp 12/16/18 1803 18     Temp 12/16/18 1803 99.3 F (37.4 C)     Temp Source 12/16/18 1803 Oral     SpO2 12/16/18 1803 100 %     Weight 12/16/18 1840 184 lb 15.5 oz (83.9 kg)     Height 12/16/18 1840 5\' 10"  (1.778 m)     Head Circumference --      Peak Flow --      Pain Score --      Pain Loc --      Pain Edu? --      Excl. in GC? --     Constitutional: Alert and oriented. Well  appearing and in no acute distress. Eyes: Conjunctivae are normal.  Head: Atraumatic. Nose: No congestion/rhinnorhea. Mouth/Throat: Mucous membranes are moist.  Oropharynx non-erythematous. Neck: No stridor. Cardiovascular: Normal rate, regular rhythm. Grossly normal heart sounds.  Good peripheral circulation. Respiratory: Normal respiratory effort.  No retractions. Lungs CTAB. Gastrointestinal: Soft and nontender. No distention. No abdominal bruits. No CVA tenderness. Musculoskeletal: No lower extremity tenderness nor edema.   Neurologic:  Normal speech and language. No gross focal neurologic deficits are appreciated.  Skin:  Skin is warm, dry and intact. No rash noted.   ____________________________________________   LABS (all labs ordered are listed, but only abnormal results are displayed)  Labs Reviewed  GLUCOSE, CAPILLARY - Abnormal; Notable for the following components:      Result Value   Glucose-Capillary 121 (*)    All other components within normal limits  ACETAMINOPHEN LEVEL - Abnormal; Notable for the following components:   Acetaminophen (Tylenol), Serum <10 (*)    All other components within normal limits  COMPREHENSIVE METABOLIC PANEL - Abnormal; Notable for the following components:   CO2 21 (*)    Glucose, Bld 134 (*)    BUN 24 (*)    AST 61 (*)    All other components within normal limits  CBC WITH DIFFERENTIAL/PLATELET - Abnormal; Notable for the following components:   WBC 12.9 (*)    Neutro Abs 9.5 (*)    All other components within normal limits  ETHANOL  SALICYLATE LEVEL  URINALYSIS, COMPLETE (UACMP) WITH MICROSCOPIC  URINE DRUG SCREEN, QUALITATIVE ([facility] ONLY)   ____________________________________________  EKG   ____________________________________________  RADIOLOGY  ED MD interpretation:  Official radiology report(s): Dg Chest Portable 1 View  Result Date: 12/16/2018 CLINICAL DATA:  Fever EXAM: PORTABLE CHEST 1 VIEW COMPARISON:   05/19/2015 FINDINGS: Chronic linear scarring in the lingula. The lungs appear otherwise clear. Cardiac and mediastinal margins appear normal. No pleural effusion is identified. IMPRESSION: 1. Chronic linear scarring in the lingula. Otherwise, no significant abnormalities are radiographically apparent. No pneumonia or pleural effusion identified. Electronically Signed   By: Gaylyn RongWalter  Liebkemann M.D.   On: 12/16/2018 20:06    ____________________________________________   PROCEDURES  Procedure(s) performed (including Critical Care):  Procedures   ____________________________________________   INITIAL IMPRESSION / ASSESSMENT AND PLAN / ED COURSE  Austin Rhodes was evaluated in Emergency Department on 12/16/2018 for the symptoms described in the history of present illness. He was evaluated in the context of the global COVID-19 pandemic, which necessitated consideration that the patient might be at risk for infection with the SARS-CoV-2 virus that causes COVID-19. Institutional protocols and algorithms that pertain to the evaluation of patients at risk for COVID-19  are in a state of rapid change based on information released by regulatory bodies including the CDC and federal and state organizations. These policies and algorithms were followed during the patient's care in the ED.              ____________________________________________   FINAL CLINICAL IMPRESSION(S) / ED DIAGNOSES  Final diagnoses:  Schizophrenia, unspecified type [facility])     ED Discharge Orders    None       Note:  This document was prepared using Dragon voice recognition software and may include unintentional dictation errors.    Nena Polio, MD 12/16/18 [phone]

## 2018-12-16 NOTE — ED Triage Notes (Signed)
Pt to ER with [employer] after driving his car into the lobby.  Pt has significant mental health history.  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  Pt denies SI or HI at this time.

## 2018-12-16 NOTE — ED Notes (Signed)
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 Patient IVC, md at bedside to assess. Patient moving his hands with talking, escalating his voice and speaking rapidly, called Md a "mother Asa Saunas", Patient labs drawn in [place]. Awaiting psych eval.

## 2018-12-17 DIAGNOSIS — F312 Bipolar disorder, current episode manic severe with psychotic features: Secondary | ICD-10-CM

## 2018-12-17 LAB — SARS CORONAVIRUS 2 (TAT 6-24 HRS): SARS Coronavirus 2: NEGATIVE

## 2018-12-17 MED ORDER — OLANZAPINE 10 MG PO TABS
10.0000 mg | ORAL_TABLET | Freq: Two times a day (BID) | ORAL | Status: DC
  Administered 2018-12-17 – 2018-12-18 (×2): 10 mg via ORAL
  Filled 2018-12-17 (×2): qty 1

## 2018-12-17 MED ORDER — DIVALPROEX SODIUM 500 MG PO DR TAB
500.0000 mg | DELAYED_RELEASE_TABLET | Freq: Two times a day (BID) | ORAL | Status: DC
  Administered 2018-12-17 – 2018-12-18 (×2): 500 mg via ORAL
  Filled 2018-12-17 (×2): qty 1

## 2018-12-17 MED ORDER — METOPROLOL SUCCINATE ER 50 MG PO TB24
25.0000 mg | ORAL_TABLET | Freq: Every day | ORAL | Status: DC
  Administered 2018-12-17 – 2018-12-18 (×2): 25 mg via ORAL
  Filled 2018-12-17 (×2): qty 1

## 2018-12-17 MED ORDER — CLOPIDOGREL BISULFATE 75 MG PO TABS
75.0000 mg | ORAL_TABLET | Freq: Every day | ORAL | Status: DC
  Administered 2018-12-17 – 2018-12-18 (×2): 75 mg via ORAL
  Filled 2018-12-17 (×3): qty 1

## 2018-12-17 MED ORDER — ADULT MULTIVITAMIN W/MINERALS CH
1.0000 | ORAL_TABLET | Freq: Every day | ORAL | Status: DC
  Administered 2018-12-17 – 2018-12-18 (×2): 1 via ORAL
  Filled 2018-12-17 (×2): qty 1

## 2018-12-17 MED ORDER — ZIPRASIDONE MESYLATE 20 MG IM SOLR
20.0000 mg | Freq: Once | INTRAMUSCULAR | Status: AC
  Administered 2018-12-17: 20 mg via INTRAMUSCULAR
  Filled 2018-12-17: qty 20

## 2018-12-17 MED ORDER — DIPHENHYDRAMINE HCL 50 MG/ML IJ SOLN
50.0000 mg | Freq: Once | INTRAMUSCULAR | Status: AC
  Administered 2018-12-17: 50 mg via INTRAMUSCULAR
  Filled 2018-12-17: qty 1

## 2018-12-17 MED ORDER — ROSUVASTATIN CALCIUM 10 MG PO TABS
10.0000 mg | ORAL_TABLET | Freq: Every day | ORAL | Status: DC
  Administered 2018-12-17 – 2018-12-18 (×2): 10 mg via ORAL
  Filled 2018-12-17 (×3): qty 1

## 2018-12-17 MED ORDER — LORAZEPAM 2 MG/ML IJ SOLN
2.0000 mg | Freq: Once | INTRAMUSCULAR | Status: AC
  Administered 2018-12-17: 2 mg via INTRAMUSCULAR
  Filled 2018-12-17: qty 1

## 2018-12-17 NOTE — ED Notes (Signed)
Pt laying on floor in front of nurses station door. Pt demanding staff retrieve a phone number for him off of [employer]. Maintained on 15 minute checks and observation by security camera for safety.

## 2018-12-17 NOTE — ED Notes (Signed)
Report to include Situation, Background, Assessment, and Recommendations received from RN. Patient alert and oriented, warm and dry, in no acute distress. Patient denies SI, HI, AVH and pain. Patient made aware of Q15 minute rounds and security cameras for their safety. Patient instructed to come to me with needs or concerns.  

## 2018-12-17 NOTE — BH Assessment (Signed)
Patient is still sleeping. 

## 2018-12-17 NOTE — BH Assessment (Signed)
Patient is not yet able to be assessed as he is still sleeping following medications.

## 2018-12-17 NOTE — ED Notes (Addendum)
Pt insists staff get the number for [employer] and local car dealerships.  Per the patient's daughter Almyra Free) the patient does not need to have contact with Quillian Quince.  RN explained to patient he could not call and buy a care from the hospital. Pt accepting. Maintained on 15 minute checks and observation by security camera for safety.

## 2018-12-17 NOTE — BH Assessment (Signed)
TTS Telephoned the patients nurse to coordinate tele assessment.  Please call [phone].

## 2018-12-17 NOTE — BH Assessment (Signed)
Patient is sleeping due to being medicated, thus is unable to be assessed at this time.

## 2018-12-17 NOTE — ED Notes (Addendum)
Pt to nurse station demanding Gatorade, coke with ice, an orange and a banana. When told we did not have all of those items available he threw the remote and became angry with staff.  NP made aware and PRN medications ordered. Maintained on 15 minute checks and observation by security camera for safety.

## 2018-12-17 NOTE — Consult Note (Addendum)
[facility] Face-to-Face Psychiatry Consult   Reason for Consult:  Mania with psychosis Referring Physician:  EDP Patient Identification: Austin Rhodes MRN:  [id number] Principal Diagnosis: Bipolar I disorder, current or most recent episode manic, with psychotic features (HCC) Diagnosis:  Principal Problem:   Bipolar I disorder, current or most recent episode manic, with psychotic features (HCC)   Total Time spent with patient: 1 hour  Subjective:   Austin Rhodes is a 68 y.o. male patient admitted with psychosis and mania.  "I have seven postgraduate degrees.  I'm a whistle blower.  I drove my car into the police department because [employer] is charging [phone] per COVID test and everyone's else test is free."  Patient is seen and evaluated in person by this provider.  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  Tangential on assessment with some agitation noted.  When this provider was leaving the room he demanded that she stay because she was his personal nurse.  His agitation increased with him throwing things and cursing, agitation medications provided.  His daughter reports that he has not been sleeping and is typically not like this.  Inpatient psychiatric admission warranted and will transfer to [facility] behavioral health unit when he is more stable.  HPI per MD:  Austin Rhodes is an 68 y.o. male. Who presents Involuntarily to the ER with c/o SI and paranoia.Pt with mental health history as states below. Shealso report her current living enviorement ( black mold in home) as a stressor which trigger thoughts of paranoia. Pt recently discharged from [facility] due to similar complaints. She state that she has yet to follow up with an outpatient provider but does have medication that was provided at discharge. She self  reports compliance most of the time. Pt denied the use of any illicit drugs but states taht she drinks alcohol daily (3-4 40oz beers) .Pt has a history of Schizophrenia  and  shares that she is compliant most of the time. Pt endorses hearing auditory hallucinations She states that they are command in nature and often tell her to kill herself. Ptis able tocontract for safety outside of the hospital system and denied current SI. Reports that since arriving to the emergency department the symptoms have resolved. She now denies SI/HI or hallucinations. Patient denies any other medical complaints. A behavioral health assessment has been completed including evaluation of the patient, collecting collateral history:, reviewing available medical/clinic records, evaluating his unique risk and protective factors, and discussing treatment recommendations.  Past Psychiatric History: Bipolar disorder  Risk to Self:  Yes Risk to Others:  Yes Prior Inpatient Therapy:  2019 and prior admissions Prior Outpatient Therapy:  Yes  Past Medical History:  Past Medical History:  Diagnosis Date  . Bipolar disorder (HCC)   . Coronary artery disease   . GERD (gastroesophageal reflux disease)   . History of multiple strokes   . PTSD (post-traumatic stress disorder)     Past Surgical History:  Procedure Laterality Date  . CARDIAC CATHETERIZATION     Family History: History reviewed. No pertinent family history. Family Psychiatric  History: Unknown Social History:  Social History   Substance and Sexual Activity  Alcohol Use No     Social History   Substance and Sexual Activity  Drug Use Never  Social History   Socioeconomic History  . Marital status: Legally Separated    Spouse name: Not on file  . Number of children: Not on file  . Years of education: Not on file  . Highest education level: Not on file  Occupational History  . Not on file  Social Needs  . Financial resource strain: Not on file   . Food insecurity    Worry: Not on file    Inability: Not on file  . Transportation needs    Medical: Not on file    Non-medical: Not on file  Tobacco Use  . Smoking status: Never Smoker  . Smokeless tobacco: Never Used  Substance and Sexual Activity  . Alcohol use: No  . Drug use: Never  . Sexual activity: Not on file  Lifestyle  . Physical activity    Days per week: Not on file    Minutes per session: Not on file  . Stress: Not on file  Relationships  . Social Herbalist on phone: Not on file    Gets together: Not on file    Attends religious service: Not on file    Active member of club or organization: Not on file    Attends meetings of clubs or organizations: Not on file    Relationship status: Not on file  Other Topics Concern  . Not on file  Social History Narrative  . Not on file   Additional Social History:    Allergies:   Allergies  Allergen Reactions  . Procaine Other (See Comments)    Labs:  Results for orders placed or performed during the hospital encounter of 12/16/18 (from the past 48 hour(s))  Glucose, capillary     Status: Abnormal   Collection Time: 12/16/18  6:00 PM  Result Value Ref Range   Glucose-Capillary 121 (H) 70 - 99 mg/dL  Acetaminophen level     Status: Abnormal   Collection Time: 12/16/18  6:05 PM  Result Value Ref Range   Acetaminophen (Tylenol), Serum <10 (L) 10 - 30 ug/mL    Comment: (NOTE) Therapeutic concentrations vary significantly. A range of 10-30 ug/mL  may be an effective concentration for many patients. However, some  are best treated at concentrations outside of this range. Acetaminophen concentrations >150 ug/mL at 4 hours after ingestion  and >50 ug/mL at 12 hours after ingestion are often associated with  toxic reactions. Performed at [facility], [address] [place], [place] [address]   Comprehensive metabolic panel     Status: Abnormal   Collection Time: 12/16/18  6:05 PM  Result  Value Ref Range   Sodium 142 135 - 145 mmol/L   Potassium 3.5 3.5 - 5.1 mmol/L   Chloride 107 98 - 111 mmol/L   CO2 21 (L) 22 - 32 mmol/L   Glucose, Bld 134 (H) 70 - 99 mg/dL   BUN 24 (H) 8 - 23 mg/dL   Creatinine, Ser 1.16 0.61 - 1.24 mg/dL   Calcium 9.3 8.9 - 10.3 mg/dL   Total Protein 7.2 6.5 - 8.1 g/dL   Albumin 4.4 3.5 - 5.0 g/dL   AST 61 (H) 15 - 41 U/L   ALT 42 0 - 44 U/L   Alkaline Phosphatase 59 38 - 126 U/L   Total Bilirubin 0.8 0.3 - 1.2 mg/dL   GFR calc non Af Amer >60 >60 mL/min   GFR calc Af Amer >60 >60 mL/min   Anion gap 14 5 -  15    Comment: Performed at [facility], [address] [place], [place] [address]  Ethanol     Status: None   Collection Time: 12/16/18  6:05 PM  Result Value Ref Range   Alcohol, Ethyl (B) <10 <10 mg/dL    Comment: (NOTE) Lowest detectable limit for serum alcohol is 10 mg/dL. For medical purposes only. Performed at [facility], [address] [place], [place] [address]   Salicylate level     Status: None   Collection Time: 12/16/18  6:05 PM  Result Value Ref Range   Salicylate Lvl <7.0 2.8 - 30.0 mg/dL    Comment: Performed at [facility], [address] [place], [place] [address]  CBC with Differential     Status: Abnormal   Collection Time: 12/16/18  6:05 PM  Result Value Ref Range   WBC 12.9 (H) 4.0 - 10.5 K/uL   RBC 5.40 4.22 - 5.81 MIL/uL   Hemoglobin 16.0 13.0 - 17.0 g/dL   HCT 72.5 36.6 - 44.0 %   MCV 84.8 80.0 - 100.0 fL   MCH 29.6 26.0 - 34.0 pg   MCHC 34.9 30.0 - 36.0 g/dL   RDW 34.7 42.5 - 95.6 %   Platelets 250 150 - 400 K/uL   nRBC 0.0 0.0 - 0.2 %   Neutrophils Relative % 74 %   Neutro Abs 9.5 (H) 1.7 - 7.7 K/uL   Lymphocytes Relative 18 %   Lymphs Abs 2.3 0.7 - 4.0 K/uL   Monocytes Relative 8 %   Monocytes Absolute 1.0 0.1 - 1.0 K/uL   Eosinophils Relative 0 %   Eosinophils Absolute 0.0 0.0 - 0.5 K/uL   Basophils Relative 0 %   Basophils Absolute 0.0 0.0 - 0.1 K/uL    Immature Granulocytes 0 %   Abs Immature Granulocytes 0.05 0.00 - 0.07 K/uL    Comment: Performed at [facility], [address] [place], [place] [address]    No current facility-administered medications for this encounter.    Current Outpatient Medications  Medication Sig Dispense Refill  . B Complex-Biotin-FA (B COMPLETE PO) Take 1 tablet by mouth daily.    . clopidogrel (PLAVIX) 75 MG tablet Take 1 tablet (75 mg total) by mouth daily. 30 tablet 3  . divalproex (DEPAKOTE) 250 MG DR tablet Take 250 mg by mouth 2 (two) times daily.    Marland Kitchen lamoTRIgine (LAMICTAL) 200 MG tablet Take 1 tablet (200 mg total) by mouth daily. 30 tablet 1  . metoprolol succinate (TOPROL-XL) 25 MG 24 hr tablet Take 1 tablet (25 mg total) by mouth daily. 30 tablet 1  . Multiple Vitamin (MULTIVITAMIN WITH MINERALS) TABS tablet Take 1 tablet by mouth daily.    . rosuvastatin (CRESTOR) 10 MG tablet Take 1 tablet (10 mg total) by mouth daily. 30 tablet 1  . QUEtiapine (SEROQUEL XR) 200 MG 24 hr tablet Take 200 mg by mouth at bedtime.      Musculoskeletal: Strength & Muscle Tone: within normal limits Gait & Station: normal Patient leans: N/A  Psychiatric Specialty Exam: Physical Exam  Nursing note and vitals reviewed. Constitutional: He is oriented to person, place, and time. He appears well-developed and well-nourished.  HENT:  Head: Normocephalic.  Neck: Normal range of motion.  Respiratory: Effort normal.  Musculoskeletal: Normal range of motion.  Neurological: He is alert and oriented to person, place, and time.  Psychiatric: His mood appears anxious. His speech is tangential. He is hyperactive. Thought content  is paranoid and delusional. Cognition and memory are impaired. He expresses impulsivity. He is inattentive.    Review of Systems  Psychiatric/Behavioral: Positive for memory loss. The patient is nervous/anxious.   All other systems reviewed and are negative.   Blood pressure 127/64,  pulse 65, temperature 97.8 F (36.6 C), temperature source Oral, resp. rate 16, height 5\' 10"  (1.778 m), weight 83.9 kg, SpO2 99 %.Body mass index is 26.54 kg/m.  General Appearance: Casual  Eye Contact:  Good  Speech:  Normal Rate  Volume:  Normal  Mood:  Anxious and Irritable  Affect:  Congruent  Thought Process:  Descriptions of Associations: Tangential  Orientation:  Full (Time, Place, and Person)  Thought Content:  Delusions, Obsessions, Paranoid Ideation and Tangential  Suicidal Thoughts:  No  Homicidal Thoughts:  No  Memory:  Immediate;   Poor Recent;   Poor Remote;   Poor  Judgement:  Impaired  Insight:  Lacking  Psychomotor Activity:  Decreased  Concentration:  Concentration: Poor and Attention Span: Poor  Recall:  Fair  Fund of Knowledge:  Fair  Language:  Good  Akathisia:  No  Handed:  Right  AIMS (if indicated):     Assets:  Leisure Time Physical Health Resilience Social Support  ADL's:  Intact  Cognition:  Impaired,  Moderate  Sleep:      68 year old male admitted for mania with psychosis, history of bipolar disorder.  Evidently, he has not been sleeping nor taking his medications.  He is currently delusional, paranoid, tangential, and anxious.  Agitation medications given due to aggression.  When patient is more stable he may transfer to behavioral medicine.  Treatment Plan Summary: Daily contact with patient to assess and evaluate symptoms and progress in treatment, Medication management and Plan Bipolar disorder, mania, severe with psychosis:  -Geodon 20 mg, Ativan 2 mg, and Benadryl 50 mg IM given for agitation once -Restart Depakote at 50 mg twice daily -Start Zyprexa 10 mg twice daily -[facility]  Disposition: Recommend psychiatric Inpatient admission when medically cleared.  Nanine MeansJamison Lord, NP 12/17/2018 4:23 PM   Case discussed and plan agreed upon as outlined above.

## 2018-12-17 NOTE — ED Notes (Signed)
Pt's sister Jacqlyn Larsen called to speak with patient.

## 2018-12-17 NOTE — ED Notes (Signed)
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  Pt can be re-directed at this time. Maintained on 15 minute checks and observation by security camera for safety.

## 2018-12-17 NOTE — ED Notes (Signed)
Pt. Introduced to unit.  Pt. Advised of cameras, safety checks and bathroom use.  Pt. Requested to use bathroom, pt. Shown bathroom.  Pt. Escorted to room #4, pt. Requested and was given drink.

## 2018-12-17 NOTE — ED Notes (Signed)
IVC/Consult completed/ Recommend Inpatient Admit  

## 2018-12-17 NOTE — ED Notes (Signed)
Hourly rounding reveals patient in room. No complaints, stable, in no acute distress. Q15 minute rounds and monitoring via Security Cameras to continue. 

## 2018-12-17 NOTE — ED Notes (Signed)
Daughter called to check on patient. Informed of phone hours and visiting times. Understanding.

## 2018-12-17 NOTE — ED Provider Notes (Signed)
-----------------------------------------   7:07 AM on 12/17/2018 -----------------------------------------   Blood pressure 127/64, pulse 65, temperature 97.8 F (36.6 C), temperature source Oral, resp. rate 16, height 5\' 10"  (1.778 m), weight 83.9 kg, SpO2 99 %.  The patient is calm and cooperative at this time.  There have been no acute events since the last update.  Awaiting disposition plan from Behavioral Medicine and/or Social Work team(s).   Carrie Mew, MD 12/17/18 [phone]

## 2018-12-17 NOTE — ED Notes (Signed)
Patient standing at door to nurses station after telling nurse that he is a government witness with 36 degrees. Asking to use telephone. Patient informed that phone time is over at which point he became upset.

## 2018-12-18 ENCOUNTER — Inpatient Hospital Stay
Admission: EM | Admit: 2018-12-18 | Discharge: 2018-12-25 | DRG: 885 | Disposition: A | Discharge status: Other Acute Inpatient Hospital | Payer: Medicare Other | Source: Intra-hospital | Attending: Psychiatry | Admitting: Psychiatry

## 2018-12-18 ENCOUNTER — Other Ambulatory Visit: Payer: Self-pay

## 2018-12-18 DIAGNOSIS — Z9181 History of falling: Secondary | ICD-10-CM | POA: Diagnosis not present

## 2018-12-18 DIAGNOSIS — Z7902 Long term (current) use of antithrombotics/antiplatelets: Secondary | ICD-10-CM

## 2018-12-18 DIAGNOSIS — K219 Gastro-esophageal reflux disease without esophagitis: Secondary | ICD-10-CM | POA: Diagnosis present

## 2018-12-18 DIAGNOSIS — Z20828 Contact with and (suspected) exposure to other viral communicable diseases: Secondary | ICD-10-CM | POA: Diagnosis present

## 2018-12-18 DIAGNOSIS — Z79899 Other long term (current) drug therapy: Secondary | ICD-10-CM | POA: Diagnosis not present

## 2018-12-18 DIAGNOSIS — I2510 Atherosclerotic heart disease of native coronary artery without angina pectoris: Secondary | ICD-10-CM | POA: Diagnosis present

## 2018-12-18 DIAGNOSIS — F319 Bipolar disorder, unspecified: Principal | ICD-10-CM | POA: Diagnosis present

## 2018-12-18 DIAGNOSIS — F312 Bipolar disorder, current episode manic severe with psychotic features: Principal | ICD-10-CM | POA: Diagnosis present

## 2018-12-18 DIAGNOSIS — F200 Paranoid schizophrenia: Secondary | ICD-10-CM | POA: Diagnosis not present

## 2018-12-18 DIAGNOSIS — F209 Schizophrenia, unspecified: Secondary | ICD-10-CM | POA: Diagnosis present

## 2018-12-18 DIAGNOSIS — Z23 Encounter for immunization: Secondary | ICD-10-CM | POA: Diagnosis not present

## 2018-12-18 DIAGNOSIS — N400 Enlarged prostate without lower urinary tract symptoms: Secondary | ICD-10-CM | POA: Diagnosis present

## 2018-12-18 DIAGNOSIS — F4310 Post-traumatic stress disorder, unspecified: Secondary | ICD-10-CM | POA: Diagnosis present

## 2018-12-18 DIAGNOSIS — Z8673 Personal history of transient ischemic attack (TIA), and cerebral infarction without residual deficits: Secondary | ICD-10-CM | POA: Diagnosis not present

## 2018-12-18 DIAGNOSIS — G4700 Insomnia, unspecified: Secondary | ICD-10-CM | POA: Diagnosis present

## 2018-12-18 DIAGNOSIS — Z888 Allergy status to other drugs, medicaments and biological substances status: Secondary | ICD-10-CM

## 2018-12-18 DIAGNOSIS — I2584 Coronary atherosclerosis due to calcified coronary lesion: Secondary | ICD-10-CM | POA: Diagnosis not present

## 2018-12-18 DIAGNOSIS — E785 Hyperlipidemia, unspecified: Secondary | ICD-10-CM | POA: Diagnosis present

## 2018-12-18 DIAGNOSIS — S0990XA Unspecified injury of head, initial encounter: Secondary | ICD-10-CM | POA: Diagnosis not present

## 2018-12-18 DIAGNOSIS — I10 Essential (primary) hypertension: Secondary | ICD-10-CM | POA: Diagnosis present

## 2018-12-18 MED ORDER — DIVALPROEX SODIUM 500 MG PO DR TAB
500.0000 mg | DELAYED_RELEASE_TABLET | Freq: Two times a day (BID) | ORAL | Status: DC
  Administered 2018-12-18 – 2018-12-24 (×12): 500 mg via ORAL
  Filled 2018-12-18 (×13): qty 1

## 2018-12-18 MED ORDER — ALUM & MAG HYDROXIDE-SIMETH 200-200-20 MG/5ML PO SUSP: 30.0000 mL | ORAL | Status: DC | PRN

## 2018-12-18 MED ORDER — OLANZAPINE 10 MG PO TABS
10.0000 mg | ORAL_TABLET | Freq: Two times a day (BID) | ORAL | Status: DC
  Administered 2018-12-18 – 2018-12-19 (×2): 10 mg via ORAL
  Filled 2018-12-18 (×2): qty 1

## 2018-12-18 MED ORDER — METOPROLOL SUCCINATE ER 25 MG PO TB24
25.0000 mg | ORAL_TABLET | Freq: Every day | ORAL | Status: DC
  Administered 2018-12-19: 25 mg via ORAL
  Filled 2018-12-18: qty 1

## 2018-12-18 MED ORDER — CLOPIDOGREL BISULFATE 75 MG PO TABS
75.0000 mg | ORAL_TABLET | Freq: Every day | ORAL | Status: DC
  Administered 2018-12-19 – 2018-12-24 (×6): 75 mg via ORAL
  Filled 2018-12-18 (×6): qty 1

## 2018-12-18 MED ORDER — ADULT MULTIVITAMIN W/MINERALS CH
1.0000 | ORAL_TABLET | Freq: Every day | ORAL | Status: DC
  Administered 2018-12-19 – 2018-12-24 (×6): 1 via ORAL
  Filled 2018-12-18 (×6): qty 1

## 2018-12-18 MED ORDER — MAGNESIUM HYDROXIDE 400 MG/5ML PO SUSP: 30.0000 mL | Freq: Every day | ORAL | Status: DC | PRN

## 2018-12-18 MED ORDER — INFLUENZA VAC A&B SA ADJ QUAD 0.5 ML IM PRSY
0.5000 mL | PREFILLED_SYRINGE | INTRAMUSCULAR | Status: DC
  Filled 2018-12-18: qty 0.5

## 2018-12-18 MED ORDER — ROSUVASTATIN CALCIUM 10 MG PO TABS
10.0000 mg | ORAL_TABLET | Freq: Every day | ORAL | Status: DC
  Administered 2018-12-19 – 2018-12-24 (×6): 10 mg via ORAL
  Filled 2018-12-18 (×6): qty 1

## 2018-12-18 MED ORDER — MENTHOL 3 MG MT LOZG
1.0000 | LOZENGE | OROMUCOSAL | Status: DC | PRN
  Filled 2018-12-18 (×2): qty 9

## 2018-12-18 MED ORDER — ACETAMINOPHEN 325 MG PO TABS
650.0000 mg | ORAL_TABLET | Freq: Four times a day (QID) | ORAL | Status: DC | PRN
  Administered 2018-12-20 – 2018-12-23 (×3): 650 mg via ORAL
  Filled 2018-12-18 (×3): qty 2

## 2018-12-18 NOTE — ED Notes (Signed)
Report called to [facility] lower level Cristie Hem, Therapist, sports. Patient will go to room 7 in [facility].

## 2018-12-18 NOTE — ED Notes (Addendum)
Pt states his chest feels tight when laying down, disappears when he sits up.  VS stable. RN will notify EDP.  Maintained on 15 minute checks and observation by security camera for safety.
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
## 2018-12-18 NOTE — ED Notes (Signed)
Hourly rounding reveals patient sleeping in room. No complaints, stable, in no acute distress. Q15 minute rounds and monitoring via Security Cameras to continue. 

## 2018-12-18 NOTE — ED Notes (Signed)
Pt speaking with TTS 

## 2018-12-18 NOTE — ED Notes (Signed)
Pt given meal tray.

## 2018-12-18 NOTE — Progress Notes (Addendum)
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 To report [facility] for charging to much money for COVID Test , patient seem delusional and over stating his worth and  tangential , patient denies suicidal or homicidal ideations , contract for safety of self and others , unit guide lines and expected behaviors are explained , body search and skin check  yielded no contraband and skin is clean  , unit and room orientation is complete , hygiene product is provided , snacks and beverages is provided,  Patient is placed in room 307 and Dr, Weber Cooks will be the attending , no distress.

## 2018-12-18 NOTE — ED Notes (Signed)
Patient approached the door as soon as this RN sat down to get report stating he wants a cough drop because he is prone to strep throat. Will send message to EDP for order.

## 2018-12-18 NOTE — ED Notes (Signed)
Pt given hygiene supplies to take a shower.

## 2018-12-18 NOTE — ED Provider Notes (Signed)
Patient complained of brief episode of chest discomfort only when lying down, resolves when standing.  EKG is normal  ED ECG REPORT I, Lavonia Drafts, the attending physician, personally viewed and interpreted this ECG.  Date: 12/18/2018  Rhythm: normal sinus rhythm QRS Axis: normal Intervals: normal ST/T Wave abnormalities: normal Narrative Interpretation: no evidence of acute ischemia    Lavonia Drafts, MD 12/18/18 1750

## 2018-12-18 NOTE — BH Assessment (Signed)
BMU is currently at capacity for current staffing. Patient was pre-assigned a room. Patient may transition after BMU discharges are completed.

## 2018-12-18 NOTE — Tx Team (Signed)
Initial Treatment Plan 12/18/2018 8:35 PM Austin Rhodes ZPH:[id number]    PATIENT STRESSORS: Financial difficulties Health problems Medication change or noncompliance Occupational concerns   PATIENT STRENGTHS: Capable of independent living Motivation for treatment/growth Special hobby/interest   PATIENT IDENTIFIED PROBLEMS: Psychotic/Manic Episodes.  Patient is Confused    Delusional    Depressed           DISCHARGE CRITERIA:  Improved stabilization in mood, thinking, and/or behavior Motivation to continue treatment in a less acute level of care Reduction of life-threatening or endangering symptoms to within safe limits Safe-care adequate arrangements made  PRELIMINARY DISCHARGE PLAN: Attend aftercare/continuing care group Attend 12-step recovery group  PATIENT/FAMILY INVOLVEMENT: This treatment plan has been presented to and reviewed with the patient, Austin Rhodes,   The patient  have been given the opportunity to ask questions and make suggestions.  Clemens Catholic, RN 12/18/2018, 8:35 PM

## 2018-12-18 NOTE — ED Notes (Signed)
Pt making demands of staff to find phone numbers and give him the phone. Maintained on 15 minute checks and observation by security camera for safety.

## 2018-12-18 NOTE — ED Notes (Signed)
Pt given meal tray and drink.

## 2018-12-18 NOTE — ED Notes (Signed)
Pt at nurses station asking for something to drink, pt provided with ginger ale  

## 2018-12-18 NOTE — ED Notes (Signed)
Pt to nurses station door 4 times in the last 10 minutes. Pt asking to call his daughter and have his personal cell phone returned to him because he has meetings about COVID 19 he must attend.  Pt reminded of unit rules and returned to his room. Maintained on 15 minute checks and observation by security camera for safety.

## 2018-12-18 NOTE — BH Assessment (Signed)
Patient is to be admitted to [facility] by Psychiatric Nurse Practitioner Waylan Boga.  Attending Physician will be Dr. Weber Cooks.   Patient has been assigned to room 306-B, by [place] (overnight).    ER staff is aware of the admission:   Nitchia, ER Secretary    Dr. Jimmye Norman, ER MD   Amy, Patient's Nurse   Marius Ditch.,  Patient Access.

## 2018-12-19 DIAGNOSIS — F312 Bipolar disorder, current episode manic severe with psychotic features: Principal | ICD-10-CM

## 2018-12-19 MED ORDER — METOPROLOL SUCCINATE ER 25 MG PO TB24
25.0000 mg | ORAL_TABLET | Freq: Every day | ORAL | Status: DC
  Administered 2018-12-20 – 2018-12-23 (×4): 25 mg via ORAL
  Filled 2018-12-19 (×4): qty 1

## 2018-12-19 MED ORDER — LORAZEPAM 2 MG PO TABS
2.0000 mg | ORAL_TABLET | Freq: Four times a day (QID) | ORAL | Status: DC | PRN
  Administered 2018-12-20 – 2018-12-24 (×7): 2 mg via ORAL
  Filled 2018-12-19 (×8): qty 1

## 2018-12-19 MED ORDER — QUETIAPINE FUMARATE 200 MG PO TABS
200.0000 mg | ORAL_TABLET | Freq: Every day | ORAL | Status: DC
  Administered 2018-12-19: 200 mg via ORAL
  Filled 2018-12-19: qty 1

## 2018-12-19 NOTE — B[facility] Group Notes (Signed)
LCSW Group Therapy Note   12/19/2018 10:55 AM   Type of Therapy and Topic:  Group Therapy:  Overcoming Obstacles   Participation Level:  Did Not Attend   Description of Group:    In this group patients will be encouraged to explore what they see as obstacles to their own wellness and recovery. They will be guided to discuss their thoughts, feelings, and behaviors related to these obstacles. The group will process together ways to cope with barriers, with attention given to specific choices patients can make. Each patient will be challenged to identify changes they are motivated to make in order to overcome their obstacles. This group will be process-oriented, with patients participating in exploration of their own experiences as well as giving and receiving support and challenge from other group members.   Therapeutic Goals: 1. Patient will identify personal and current obstacles as they relate to admission. 2. Patient will identify barriers that currently interfere with their wellness or overcoming obstacles.  3. Patient will identify feelings, thought process and behaviors related to these barriers. 4. Patient will identify two changes they are willing to make to overcome these obstacles:      Summary of Patient Progress x     Therapeutic Modalities:   Cognitive Behavioral Therapy Solution Focused Therapy Motivational Interviewing Relapse Prevention Therapy  Sharelle Burditt, MSW, LCSW Clinical Social Work 12/19/2018 10:55 AM   

## 2018-12-19 NOTE — Progress Notes (Signed)
Patient keeps coming up to the nurses station and stated that he just "bought [employer] through my nonprofit, so I need my bag with my cell phone and belt, I don't need a note from the doctor". Patient also stated "send the doctor a note and tell him he has until 5pm to let him go or I'm going to call the [employer]".

## 2018-12-19 NOTE — Progress Notes (Signed)
Recreation Therapy Notes  Date: 12/19/2018  Time: 9:30 am   Location: Craft room   Behavioral response: N/A   Intervention Topic: Goals  Discussion/Intervention: Patient did not attend group.   Clinical Observations/Feedback:  Patient did not attend group.   Austin Rhodes LRT/CTRS        [name] 12/19/2018 11:09 AM

## 2018-12-19 NOTE — Plan of Care (Signed)
Patient present with bizarre   behavior and self grandiose, but has not present any aggression  or destructive behaviors, patient is medication compliant , safety is maintained  , denies any SI/HI/AVH, encourage coping skills , and participate in groups and plan of care , support and education is provided , has not ask for any PRNs , patient exhibits generalize anxiety, difficulty making decisions and mentally challenged , education is provided no distress.   Problem: Education: Goal: Knowledge of [place] [facility] Education information/materials will improve Outcome: Progressing Goal: Emotional status will improve Outcome: Progressing Goal: Mental status will improve Outcome: Progressing Goal: Verbalization of understanding the information provided will improve Outcome: Progressing   Problem: Activity: Goal: Interest or engagement in activities will improve Outcome: Progressing Goal: Sleeping patterns will improve Outcome: Progressing   Problem: Coping: Goal: Ability to verbalize frustrations and anger appropriately will improve Outcome: Progressing Goal: Ability to demonstrate self-control will improve Outcome: Progressing   Problem: Health Behavior/Discharge Planning: Goal: Identification of resources available to assist in meeting health care needs will improve Outcome: Progressing Goal: Compliance with treatment plan for underlying cause of condition will improve Outcome: Progressing   Problem: Physical Regulation: Goal: Ability to maintain clinical measurements within normal limits will improve Outcome: Progressing   Problem: Safety: Goal: Periods of time without injury will increase Outcome: Progressing   Problem: Activity: Goal: Will verbalize the importance of balancing activity with adequate rest periods Outcome: Progressing   Problem: Education: Goal: Will be free of psychotic symptoms Outcome: Progressing Goal: Knowledge of the prescribed therapeutic  regimen will improve Outcome: Progressing   Problem: Coping: Goal: Coping ability will improve Outcome: Progressing Goal: Will verbalize feelings Outcome: Progressing   Problem: Health Behavior/Discharge Planning: Goal: Compliance with prescribed medication regimen will improve Outcome: Progressing   Problem: Nutritional: Goal: Ability to achieve adequate nutritional intake will improve Outcome: Progressing   Problem: Role Relationship: Goal: Ability to communicate needs accurately will improve Outcome: Progressing Goal: Ability to interact with others will improve Outcome: Progressing   Problem: Safety: Goal: Ability to redirect hostility and anger into socially appropriate behaviors will improve Outcome: Progressing Goal: Ability to remain free from injury will improve Outcome: Progressing   Problem: Self-Care: Goal: Ability to participate in self-care as condition permits will improve Outcome: Progressing   Problem: Self-Concept: Goal: Will verbalize positive feelings about self Outcome: Progressing

## 2018-12-19 NOTE — Progress Notes (Signed)
Patient stated to this writer that the fire alarm that just went off was "the signal that we just bought the place".

## 2018-12-19 NOTE — Progress Notes (Signed)
Patient states to this Probation officer, "email that doctor and tell him, no discharge papers, he's fired".

## 2018-12-19 NOTE — Plan of Care (Signed)
D- Patient alert and oriented. Patient presented in a preoccupied mood on assessment stating that he only slept two hours last night. 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 Patient also stated that he is getting married this weekend to "Arwa Damon of [employer]". Patient denied signs/symptoms of depression/anxiety to this writer stating that he was going to be leaving soon. Patient also denied SI, HI, AVH, and pain at this time, although he stated that he wanted to kill "Sandy [place]". Patient's goal for today is to "transfer to house".  A- Scheduled medications administered to patient, per MD orders. Support and encouragement provided.  Routine safety checks conducted every 15 minutes.  Patient informed to notify staff with problems or concerns.  R- No adverse drug reactions noted. Patient contracts for safety at this time. Patient compliant with medications and treatment plan. Patient receptive and cooperative. Patient remains safe at this time.  Problem: Education: Goal: Knowledge of [place] [facility] Education information/materials will improve Outcome: Not Progressing Goal: Emotional status will improve Outcome: Not Progressing Goal: Mental status will improve Outcome: Not Progressing Goal: Verbalization of understanding the information provided will improve Outcome: Not Progressing   Problem: Activity: Goal: Interest or engagement in activities will improve Outcome: Not Progressing Goal: Sleeping patterns will improve Outcome: Not Progressing   Problem: Coping: Goal: Ability to verbalize frustrations and anger appropriately will improve Outcome: Not Progressing Goal: Ability to demonstrate self-control will improve Outcome: Not Progressing   Problem: Health Behavior/Discharge Planning: Goal: Identification of resources available to assist in meeting health care needs will improve Outcome: Not Progressing Goal: Compliance  with treatment plan for underlying cause of condition will improve Outcome: Not Progressing   Problem: Physical Regulation: Goal: Ability to maintain clinical measurements within normal limits will improve Outcome: Not Progressing   Problem: Safety: Goal: Periods of time without injury will increase Outcome: Not Progressing   Problem: Activity: Goal: Will verbalize the importance of balancing activity with adequate rest periods Outcome: Not Progressing   Problem: Education: Goal: Will be free of psychotic symptoms Outcome: Not Progressing Goal: Knowledge of the prescribed therapeutic regimen will improve Outcome: Not Progressing   Problem: Coping: Goal: Coping ability will improve Outcome: Not Progressing Goal: Will verbalize feelings Outcome: Not Progressing   Problem: Health Behavior/Discharge Planning: Goal: Compliance with prescribed medication regimen will improve Outcome: Not Progressing   Problem: Nutritional: Goal: Ability to achieve adequate nutritional intake will improve Outcome: Not Progressing   Problem: Role Relationship: Goal: Ability to communicate needs accurately will improve Outcome: Not Progressing Goal: Ability to interact with others will improve Outcome: Not Progressing   Problem: Safety: Goal: Ability to redirect hostility and anger into socially appropriate behaviors will improve Outcome: Not Progressing Goal: Ability to remain free from injury will improve Outcome: Not Progressing   Problem: Self-Care: Goal: Ability to participate in self-care as condition permits will improve Outcome: Not Progressing   Problem: Self-Concept: Goal: Will verbalize positive feelings about self Outcome: Not Progressing
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
## 2018-12-19 NOTE — BHH Counselor (Signed)
CSW attempted to complete PSA with patient.  Patient remains psychotic at this time and PSA was unable to be completed.   Patient was unable to provide permission for SPE or aftercare as well.  [name], MSW, LCSW 12/19/2018 9:49 AM

## 2018-12-19 NOTE — B[facility] Suicide Risk Assessment (Signed)
[facility] Admission Suicide Risk Assessment   Nursing information obtained from:  Patient Demographic factors:  Male, Caucasian, Age 68 or older Current Mental Status:  Suicidal ideation indicated by others Loss Factors:  Legal issues Historical Factors:  Impulsivity Risk Reduction Factors:  Religious beliefs about death  Total Time spent with patient: 1 hour Principal Problem: Affective psychosis, bipolar (HCC) Diagnosis:  Principal Problem:   Affective psychosis, bipolar (HCC) Active Problems:   HTN (hypertension)  Subjective Data: Patient seen and chart reviewed.  68 year old man with a history of bipolar disorder presents to the hospital with manic symptoms and dangerously agitated behavior in the community.  He completely denies suicidal ideation denies any past history of suicidal behavior.  By contrast he actually presents as grandiose.  Continued Clinical Symptoms:  Alcohol Use Disorder Identification Test Final Score (AUDIT): 1 The "Alcohol Use Disorders Identification Test", Guidelines for Use in Primary Care, Second Edition.  World Science writer [facility]). Score between 0-7:  no or low risk or alcohol related problems. Score between 8-15:  moderate risk of alcohol related problems. Score between 16-19:  high risk of alcohol related problems. Score 20 or above:  warrants further diagnostic evaluation for alcohol dependence and treatment.   CLINICAL FACTORS:   Bipolar Disorder:   Mixed State   Musculoskeletal: Strength & Muscle Tone: within normal limits Gait & Station: normal Patient leans: N/A  Psychiatric Specialty Exam: Physical Exam  Nursing note and vitals reviewed. Constitutional: He appears well-developed and well-nourished.  HENT:  Head: Normocephalic and atraumatic.  Eyes: Pupils are equal, round, and reactive to light. Conjunctivae are normal.  Neck: Normal range of motion.  Cardiovascular: Regular rhythm and normal heart sounds.  Respiratory: Effort  normal. No respiratory distress.  GI: Soft.  Musculoskeletal: Normal range of motion.  Neurological: He is alert.  Skin: Skin is warm and dry.  Psychiatric: His affect is labile and inappropriate. His speech is rapid and/or pressured. He is agitated. He is not aggressive. Thought content is paranoid and delusional. Cognition and memory are impaired. He expresses impulsivity and inappropriate judgment. He expresses no homicidal and no suicidal ideation.    Review of Systems  Constitutional: Negative.   HENT: Negative.   Eyes: Negative.   Respiratory: Negative.   Cardiovascular: Negative.   Gastrointestinal: Negative.   Musculoskeletal: Negative.   Skin: Negative.   Neurological: Negative.   Psychiatric/Behavioral: Negative for depression, hallucinations, memory loss, substance abuse and suicidal ideas. The patient is not nervous/anxious and does not have insomnia.     Blood pressure (!) 142/86, pulse (!) 107, temperature (!) 97.5 F (36.4 C), temperature source Oral, resp. rate 18, height 5\' 9"  (1.753 m), weight 79.4 kg, SpO2 97 %.Body mass index is 25.84 kg/m.  General Appearance: Casual  Eye Contact:  Fair  Speech:  Pressured  Volume:  Increased  Mood:  Euphoric  Affect:  Labile  Thought Process:  Disorganized  Orientation:  Negative  Thought Content:  Illogical, Delusions, Paranoid Ideation, Rumination and Tangential  Suicidal Thoughts:  No  Homicidal Thoughts:  No  Memory:  Immediate;   Fair Recent;   Poor Remote;   Fair  Judgement:  Impaired  Insight:  Fair  Psychomotor Activity:  Normal  Concentration:  Concentration: Fair  Recall:  of Knowledge:  Fair  Language:  Fair  Akathisia:  No  Handed:  Right  AIMS (if indicated):     Assets:  Desire for Improvement Housing Physical Health Social Support  ADL's:  Intact  Cognition:  Impaired,  Mild  Sleep:  Number of Hours: 7      COGNITIVE FEATURES THAT CONTRIBUTE TO RISK:  Loss of executive function  and Thought constriction (tunnel vision)    SUICIDE RISK:   Minimal: No identifiable suicidal ideation.  Patients presenting with no risk factors but with morbid ruminations; may be classified as minimal risk based on the severity of the depressive symptoms  PLAN OF CARE: Patient is presenting with mania which has been his presentation mostly in the past.  No evidence of suicidal thinking or behavior at all.  Continue 15-minute checks however for his agitation and potential for aggression.  Medicines for bipolar disorder.  Ongoing reassessment prior to discharge  I certify that inpatient services furnished can reasonably be expected to improve the patient's condition.   Alethia Berthold, MD 12/19/2018, 4:41 PM

## 2018-12-20 LAB — GLUCOSE, CAPILLARY: Glucose-Capillary: 174 mg/dL — ABNORMAL HIGH (ref 70–99)

## 2018-12-20 MED ORDER — QUETIAPINE FUMARATE 200 MG PO TABS
400.0000 mg | ORAL_TABLET | Freq: Every day | ORAL | Status: DC
  Administered 2018-12-21: 400 mg via ORAL
  Filled 2018-12-20: qty 2

## 2018-12-20 NOTE — BHH Group Notes (Signed)
LCSW Group Therapy Note  12/20/2018 1:00 PM  Type of Therapy/Topic:  Group Therapy:  Feelings about Diagnosis  Participation Level:  Did Not Attend   Description of Group:   This group will allow patients to explore their thoughts and feelings about diagnoses they have received. Patients will be guided to explore their level of understanding and acceptance of these diagnoses. Facilitator will encourage patients to process their thoughts and feelings about the reactions of others to their diagnosis and will guide patients in identifying ways to discuss their diagnosis with significant others in their lives. This group will be process-oriented, with patients participating in exploration of their own experiences, giving and receiving support, and processing challenge from other group members.   Therapeutic Goals: 1. Patient will demonstrate understanding of diagnosis as evidenced by identifying two or more symptoms of the disorder 2. Patient will be able to express two feelings regarding the diagnosis 3. Patient will demonstrate their ability to communicate their needs through discussion and/or role play  Summary of Patient Progress: X  Therapeutic Modalities:   Cognitive Behavioral Therapy Brief Therapy Feelings Identification   Laurene Melendrez, MSW, LCSW 12/20/2018 11:48 AM 

## 2018-12-20 NOTE — Progress Notes (Signed)
[facility]  12/20/2018 2:07 PM Ashok CroonMaurice D Gick  MRN:  [id number] Subjective: Patient seen and chart reviewed.  Patient attended treatment team today.  He remains extremely psychotic to the point where it is essentially impossible to have a conversation with him.  Every question or topic in the conversation immediately leads him to bizarrely grandiose statements about how he owns the hospital owns all of us employs us etc.  Speech is pressured.  Affect which started out relatively pleasant rapidly switched into the hospital to the point that he was posturing for a fight at 1 point during treatment team over no particular stimulus at all.  Patient does show that he has been compliant with medicine.  Blood pressure today slightly high but not enormously so.  No physical complaints.  Has not been violent or threatening as far as I know to other patients Principal Problem: Affective psychosis, bipolar (HCC) Diagnosis: Principal Problem:   Affective psychosis, bipolar (HCC) Active Problems:   HTN (hypertension)  Total Time spent with patient: 30 minutes  Past Psychiatric History: Patient has a history of bipolar disorder  Past Medical History:  Past Medical History:  Diagnosis Date  . Bipolar disorder (HCC)   . Coronary artery disease   . GERD (gastroesophageal reflux disease)   . History of multiple strokes   . PTSD (post-traumatic stress disorder)     Past Surgical History:  Procedure Laterality Date  . CARDIAC CATHETERIZATION     Family History: History reviewed. No pertinent family history. Family Psychiatric  History: See previous Social History:  Social History   Substance and Sexual Activity  Alcohol Use No     Social History   Substance and Sexual Activity  Drug Use Never    Social History   Socioeconomic History  . Marital status: Legally Separated    Spouse name: Not on file  . Number of children: Not on file  . Years of education: Not on file  .  Highest education level: Not on file  Occupational History  . Not on file  Social Needs  . Financial resource strain: Not on file  . Food insecurity    Worry: Not on file    Inability: Not on file  . Transportation needs    Medical: Not on file    Non-medical: Not on file  Tobacco Use  . Smoking status: Never Smoker  . Smokeless tobacco: Never Used  Substance and Sexual Activity  . Alcohol use: No  . Drug use: Never  . Sexual activity: Not on file  Lifestyle  . Physical activity    Days per week: Not on file    Minutes per session: Not on file  . Stress: Not on file  Relationships  . Social Musicianconnections    Talks on phone: Not on file    Gets together: Not on file    Attends religious service: Not on file    Active member of club or organization: Not on file    Attends meetings of clubs or organizations: Not on file    Relationship status: Not on file  Other Topics Concern  . Not on file  Social History Narrative  . Not on file   Additional Social History:                         Sleep: Fair  Appetite:  Fair  Current Medications: Current Facility-Administered Medications  Medication Dose Route Frequency Provider Last Rate  Last Dose  . acetaminophen (TYLENOL) tablet 650 mg  650 mg Oral Q6H PRN Charm Rings, NP      . alum & mag hydroxide-simeth (MAALOX/MYLANTA) 200-200-20 MG/5ML suspension 30 mL  30 mL Oral Q4H PRN Charm Rings, NP      . clopidogrel (PLAVIX) tablet 75 mg  75 mg Oral Daily Charm Rings, NP   75 mg at 12/20/18 7169  . divalproex (DEPAKOTE) DR tablet 500 mg  500 mg Oral Q12H Charm Rings, NP   500 mg at 12/20/18 6789  . influenza vaccine adjuvanted (FLUAD) injection 0.5 mL  0.5 mL Intramuscular Tomorrow-1000 Clapacs, John T, MD      . LORazepam (ATIVAN) tablet 2 mg  2 mg Oral Q6H PRN Clapacs, John T, MD      . magnesium hydroxide (MILK OF MAGNESIA) suspension 30 mL  30 mL Oral Daily PRN Charm Rings, NP      . metoprolol  succinate (TOPROL-XL) 24 hr tablet 25 mg  25 mg Oral Daily Clapacs, Jackquline Denmark, MD   25 mg at 12/20/18 3810  . multivitamin with minerals tablet 1 tablet  1 tablet Oral Daily Charm Rings, NP   1 tablet at 12/20/18 [phone]  . QUEtiapine (SEROQUEL) tablet 200 mg  200 mg Oral QHS Clapacs, John T, MD   200 mg at 12/19/18 2144  . rosuvastatin (CRESTOR) tablet 10 mg  10 mg Oral Daily Charm Rings, NP   10 mg at 12/19/18 1720    Lab Results:  Results for orders placed or performed during the hospital encounter of 12/18/18 (from the past 48 hour(s))  Glucose, capillary     Status: Abnormal   Collection Time: 12/20/18  8:57 AM  Result Value Ref Range   Glucose-Capillary 174 (H) 70 - 99 mg/dL    Blood Alcohol level:  Lab Results  Component Value Date   ETH <10 12/16/2018   ETH <10 10/29/2017    Metabolic Disorder Labs: Lab Results  Component Value Date   HGBA1C 5.5 11/03/2017   MPG 111 11/03/2017   MPG 123 01/29/2009   No results found for: PROLACTIN Lab Results  Component Value Date   CHOL 133 11/03/2017   TRIG 148 11/03/2017   HDL 44 11/03/2017   CHOLHDL 3.0 11/03/2017   VLDL 30 11/03/2017   LDLCALC 59 11/03/2017   LDLCALC 39 05/10/2015    Physical Findings: AIMS: Facial and Oral Movements Muscles of Facial Expression: None, normal Lips and Perioral Area: None, normal Jaw: None, normal Tongue: None, normal,Extremity Movements Upper (arms, wrists, hands, fingers): None, normal Lower (legs, knees, ankles, toes): None, normal, Trunk Movements Neck, shoulders, hips: None, normal, Overall Severity Severity of abnormal movements (highest score from questions above): None, normal Incapacitation due to abnormal movements: None, normal Patient's awareness of abnormal movements (rate only patient's report): No Awareness, Dental Status Current problems with teeth and/or dentures?: No Does patient usually wear dentures?: No  CIWA:    COWS:     Musculoskeletal: Strength & Muscle  Tone: within normal limits Gait & Station: normal Patient leans: N/A  Psychiatric Specialty Exam: Physical Exam  Nursing note and vitals reviewed. Constitutional: He appears well-developed and well-nourished.  HENT:  Head: Normocephalic and atraumatic.  Eyes: Pupils are equal, round, and reactive to light. Conjunctivae are normal.  Neck: Normal range of motion.  Cardiovascular: Regular rhythm and normal heart sounds.  Respiratory: Effort normal. No respiratory distress.  GI: Soft.  Musculoskeletal: Normal range  of motion.  Neurological: He is alert.  Skin: Skin is warm and dry.  Psychiatric: His affect is labile and inappropriate. His speech is rapid and/or pressured. He is agitated, aggressive and hyperactive. Thought content is paranoid and delusional. Cognition and memory are impaired. He expresses inappropriate judgment.    Review of Systems  Constitutional: Negative.   HENT: Negative.   Eyes: Negative.   Respiratory: Negative.   Cardiovascular: Negative.   Gastrointestinal: Negative.   Musculoskeletal: Negative.   Skin: Negative.   Neurological: Negative.   Psychiatric/Behavioral: Negative for depression, hallucinations, memory loss, substance abuse and suicidal ideas. The patient is nervous/anxious and has insomnia.     Blood pressure (!) 139/95, pulse (!) 115, temperature 97.6 F (36.4 C), temperature source Oral, resp. rate 18, height 5\' 9"  (1.753 m), weight 79.4 kg, SpO2 97 %.Body mass index is 25.84 kg/m.  General Appearance: Casual  Eye Contact:  Minimal  Speech:  Garbled and Pressured  Volume:  Increased  Mood:  Irritable  Affect:  Inappropriate and Labile  Thought Process:  Disorganized  Orientation:  Full (Time, Place, and Person)  Thought Content:  Illogical, Delusions, Hallucinations: Auditory, Ideas of Reference:   Paranoia, Paranoid Ideation, Rumination and Tangential  Suicidal Thoughts:  No  Homicidal Thoughts:  No  Memory:  Immediate;   Fair Recent;    Poor Remote;   Poor  Judgement:  Impaired  Insight:  Lacking  Psychomotor Activity:  Restlessness  Concentration:  Concentration: Fair  Recall:  [place] of Knowledge:  Fair  Language:  Fair  Akathisia:  No  Handed:  Right  AIMS (if indicated):     Assets:  Housing Physical Health Resilience  ADL's:  Impaired  Cognition:  Impaired,  Mild  Sleep:  Number of Hours: 6     Treatment Plan Summary: Daily contact with patient to assess and evaluate symptoms and progress in treatment, Medication management and Plan Increased dose of Seroquel to 400 mg at night.  No change to other medicine.  Continue to check blood pressure if it is growing elevated we may need to increase his medication but I do not want to risk harming a syncopal episode.  Check Depakote level probably in another 2 days.  Continue monitoring his behavior.  We are trying to reach his daughter.  Patient today told us that his daughter is employed at a certain [employer] office in [place] and encouraged [place] to try and reach her at that office.  Alethia Berthold, MD 12/20/2018, 2:07 PM

## 2018-12-20 NOTE — Progress Notes (Signed)
Recreation Therapy Notes  INPATIENT RECREATION THERAPY ASSESSMENT  Patient Details Name: Austin Rhodes MRN: [id number] DOB: January 15, 1951 Today's Date: 12/20/2018       Information Obtained From: Patient(Patient unable to complete assessment at this time)  Able to Participate in Assessment/Interview:    Patient Presentation:    Reason for Admission (Per Patient):    Patient Stressors:    Coping Skills:      Leisure Interests (2+):     Frequency of Recreation/Participation:    Awareness of Community Resources:     [employer]:     Current Use:    If no, Barriers?:    Expressed Interest in [place] of Residence:     Patient Main Form of Transportation:    Patient Strengths:     Patient Identified Areas of Improvement:     Patient Goal for Hospitalization:     Current SI (including self-harm):     Current HI:     Current AVH:    Staff Intervention Plan:    Consent to Intern Participation:    Viraaj Vorndran 12/20/2018, 12:25 PM

## 2018-12-20 NOTE — [facility] Counselor (Signed)
CSW attempted to complete the PSA with the patient, however, patient remains psychotic at this time.    Austin Rhodes, MSW, LCSW 12/20/2018 9:15 AM

## 2018-12-20 NOTE — Tx Team (Addendum)
Interdisciplinary Treatment and Diagnostic Plan Update  12/20/2018 Time of Session: 900am KEENAN TREFRY MRN: [id number]  Principal Diagnosis: Affective psychosis, bipolar ([place])  Secondary Diagnoses: Principal Problem:   Affective psychosis, bipolar ([place]) Active Problems:   HTN (hypertension)   Current Medications:  Current Facility-Administered Medications  Medication Dose Route Frequency Provider Last Rate Last Dose  . acetaminophen (TYLENOL) tablet 650 mg  650 mg Oral Q6H PRN Patrecia Pour, NP      . alum & mag hydroxide-simeth (MAALOX/MYLANTA) 200-200-20 MG/5ML suspension 30 mL  30 mL Oral Q4H PRN Patrecia Pour, NP      . clopidogrel (PLAVIX) tablet 75 mg  75 mg Oral Daily Patrecia Pour, NP   75 mg at 12/20/18 4166  . divalproex (DEPAKOTE) DR tablet 500 mg  500 mg Oral Q12H Patrecia Pour, NP   500 mg at 12/20/18 0630  . influenza vaccine adjuvanted (FLUAD) injection 0.5 mL  0.5 mL Intramuscular Tomorrow-1000 Clapacs, John T, MD      . LORazepam (ATIVAN) tablet 2 mg  2 mg Oral Q6H PRN Clapacs, John T, MD      . magnesium hydroxide (MILK OF MAGNESIA) suspension 30 mL  30 mL Oral Daily PRN Patrecia Pour, NP      . metoprolol succinate (TOPROL-XL) 24 hr tablet 25 mg  25 mg Oral Daily Clapacs, Madie Reno, MD   25 mg at 12/20/18 1601  . multivitamin with minerals tablet 1 tablet  1 tablet Oral Daily Patrecia Pour, NP   1 tablet at 12/20/18 [phone]  . QUEtiapine (SEROQUEL) tablet 200 mg  200 mg Oral QHS Clapacs, John T, MD   200 mg at 12/19/18 2144  . rosuvastatin (CRESTOR) tablet 10 mg  10 mg Oral Daily Patrecia Pour, NP   10 mg at 12/19/18 1720   PTA Medications: Medications Prior to Admission  Medication Sig Dispense Refill Last Dose  . B Complex-Biotin-FA (B COMPLETE PO) Take 1 tablet by mouth daily.     . clopidogrel (PLAVIX) 75 MG tablet Take 1 tablet (75 mg total) by mouth daily. 30 tablet 3   . divalproex (DEPAKOTE) 250 MG DR tablet Take 250 mg by mouth 2 (two) times  daily.     Marland Kitchen lamoTRIgine (LAMICTAL) 200 MG tablet Take 1 tablet (200 mg total) by mouth daily. 30 tablet 1   . metoprolol succinate (TOPROL-XL) 25 MG 24 hr tablet Take 1 tablet (25 mg total) by mouth daily. 30 tablet 1   . Multiple Vitamin (MULTIVITAMIN WITH MINERALS) TABS tablet Take 1 tablet by mouth daily.     . QUEtiapine (SEROQUEL XR) 200 MG 24 hr tablet Take 200 mg by mouth at bedtime.     . rosuvastatin (CRESTOR) 10 MG tablet Take 1 tablet (10 mg total) by mouth daily. 30 tablet 1     Patient Stressors: Financial difficulties Health problems Medication change or noncompliance Occupational concerns  Patient Strengths: Capable of independent living Motivation for treatment/growth Special hobby/interest  Treatment Modalities: Medication Management, Group therapy, Case management,  1 to 1 session with clinician, Psychoeducation, Recreational therapy.   Physician Treatment Plan for Primary Diagnosis: Affective psychosis, bipolar ([place]) Long Term Goal(s): Improvement in symptoms so as ready for discharge Improvement in symptoms so as ready for discharge   Short Term Goals: Ability to verbalize feelings will improve Ability to demonstrate self-control will improve Ability to maintain clinical measurements within normal limits will improve Compliance with prescribed medications will improve  Medication Management: Evaluate patient's response, side effects, and tolerance of medication regimen.  Therapeutic Interventions: 1 to 1 sessions, Unit Group sessions and Medication administration.  Evaluation of Outcomes: Not Met  Physician Treatment Plan for Secondary Diagnosis: Principal Problem:   Affective psychosis, bipolar ([place]) Active Problems:   HTN (hypertension)  Long Term Goal(s): Improvement in symptoms so as ready for discharge Improvement in symptoms so as ready for discharge   Short Term Goals: Ability to verbalize feelings will improve Ability to demonstrate  self-control will improve Ability to maintain clinical measurements within normal limits will improve Compliance with prescribed medications will improve     Medication Management: Evaluate patient's response, side effects, and tolerance of medication regimen.  Therapeutic Interventions: 1 to 1 sessions, Unit Group sessions and Medication administration.  Evaluation of Outcomes: Not Met   RN Treatment Plan for Primary Diagnosis: Affective psychosis, bipolar ([place]) Long Term Goal(s): Knowledge of disease and therapeutic regimen to maintain health will improve  Short Term Goals: Ability to remain free from injury will improve, Ability to demonstrate self-control, Ability to participate in decision making will improve and Compliance with prescribed medications will improve  Medication Management: RN will administer medications as ordered by provider, will assess and evaluate patient's response and provide education to patient for prescribed medication. RN will report any adverse and/or side effects to prescribing provider.  Therapeutic Interventions: 1 on 1 counseling sessions, Psychoeducation, Medication administration, Evaluate responses to treatment, Monitor vital signs and CBGs as ordered, Perform/monitor CIWA, COWS, AIMS and Fall Risk screenings as ordered, Perform wound care treatments as ordered.  Evaluation of Outcomes: Not Met   LCSW Treatment Plan for Primary Diagnosis: Affective psychosis, bipolar ([place]) Long Term Goal(s): Safe transition to appropriate next level of care at discharge, Engage patient in therapeutic group addressing interpersonal concerns.  Short Term Goals: Engage patient in aftercare planning with referrals and resources, Facilitate acceptance of mental health diagnosis and concerns, Identify triggers associated with mental health/substance abuse issues and Increase skills for wellness and recovery  Therapeutic Interventions: Assess for all discharge needs, 1 to 1  time with Social worker, Explore available resources and support systems, Assess for adequacy in community support network, Educate family and significant other(s) on suicide prevention, Complete Psychosocial Assessment, Interpersonal group therapy.  Evaluation of Outcomes: Not Met   Progress in Treatment: Attending groups: No. Participating in groups: No. Taking medication as prescribed: Yes. Toleration medication: Yes. Family/Significant other contact made: No, will contact:  when consent given Patient understands diagnosis: No. Discussing patient identified problems/goals with staff: Yes. Medical problems stabilized or resolved: Yes. Denies suicidal/homicidal ideation: Yes. Issues/concerns per patient self-inventory: No. Other: N/A  New problem(s) identified: No, Describe:  none  New Short Term/Long Term Goal(s): Detox, elimination of AVH/symptoms of psychosis, medication management for mood stabilization; elimination of SI thoughts; development of comprehensive mental wellness/sobriety plan.   Patient Goals:  "Put [place] in jail"  Discharge Plan or Barriers: SPE pamphlet, Mobile Crisis information, and AA/NA information provided to patient for additional community support and resources. CSW assessing for appropriate referrals.  Reason for Continuation of Hospitalization: Delusions  [place] Medication stabilization  Estimated Length of Stay: TBD  Recreational Therapy: Patient Stressors: N/A  Patient Goal: Patient will focus on task/topic with 2 prompts from staff within 5 recreation therapy group  sessions  Attendees: Patient: Elgie Maziarz 12/20/2018 10:03 AM  Physician: Dr Weber Cooks MD 12/20/2018 10:03 AM  Nursing: Polly Cobia RN 12/20/2018 10:03 AM  RN Care Manager: 12/20/2018 10:03 AM  Social Worker: Minette Brine Moton LCSW 12/20/2018 10:03 AM  Recreational Therapist: Roanna Epley CTRS LRT 12/20/2018 10:03 AM  Other: Sanjuana Kava LCSW  12/20/2018 10:03 AM  Other:  [name] LCSW 12/20/2018 10:03 AM  Other: 12/20/2018 10:03 AM    Scribe for Treatment Team: [place], LCSW 12/20/2018 10:03 AM

## 2018-12-20 NOTE — Plan of Care (Signed)
D- Patient alert and oriented. Patient continues to present in a preoccupied mood on assessment stating that he slept "the best I've slept in months, I woke up refreshed" and had no complaints to voice at this time. Patient denied any signs/symptoms of depression/anxiety at this time. Patient also denied SI, HI, AVH, and pain, stating "no, I save lives". Patient's goal for today is to "put Trump in jail".  A- Scheduled medications administered to patient, per MD orders. Support and encouragement provided.  Routine safety checks conducted every 15 minutes.  Patient informed to notify staff with problems or concerns.  R- No adverse drug reactions noted. Patient contracts for safety at this time. Patient compliant with medications and treatment plan. Patient receptive, calm, and cooperative. Patient interacts well with others on the unit.  Patient remains safe at this time.  Problem: Education: Goal: Knowledge of [facility] Education information/materials will improve Outcome: Not Progressing Goal: Emotional status will improve Outcome: Not Progressing Goal: Mental status will improve Outcome: Not Progressing Goal: Verbalization of understanding the information provided will improve Outcome: Not Progressing   Problem: Activity: Goal: Interest or engagement in activities will improve Outcome: Not Progressing Goal: Sleeping patterns will improve Outcome: Not Progressing   Problem: Coping: Goal: Ability to verbalize frustrations and anger appropriately will improve Outcome: Not Progressing Goal: Ability to demonstrate self-control will improve Outcome: Not Progressing   Problem: Health Behavior/Discharge Planning: Goal: Identification of resources available to assist in meeting health care needs will improve Outcome: Not Progressing Goal: Compliance with treatment plan for underlying cause of condition will improve Outcome: Not Progressing   Problem: Physical Regulation: Goal:  Ability to maintain clinical measurements within normal limits will improve Outcome: Not Progressing   Problem: Safety: Goal: Periods of time without injury will increase Outcome: Not Progressing   Problem: Activity: Goal: Will verbalize the importance of balancing activity with adequate rest periods Outcome: Not Progressing   Problem: Education: Goal: Will be free of psychotic symptoms Outcome: Not Progressing Goal: Knowledge of the prescribed therapeutic regimen will improve Outcome: Not Progressing   Problem: Coping: Goal: Coping ability will improve Outcome: Not Progressing Goal: Will verbalize feelings Outcome: Not Progressing   Problem: Health Behavior/Discharge Planning: Goal: Compliance with prescribed medication regimen will improve Outcome: Not Progressing   Problem: Nutritional: Goal: Ability to achieve adequate nutritional intake will improve Outcome: Not Progressing   Problem: Role Relationship: Goal: Ability to communicate needs accurately will improve Outcome: Not Progressing Goal: Ability to interact with others will improve Outcome: Not Progressing   Problem: Safety: Goal: Ability to redirect hostility and anger into socially appropriate behaviors will improve Outcome: Not Progressing Goal: Ability to remain free from injury will improve Outcome: Not Progressing   Problem: Self-Care: Goal: Ability to participate in self-care as condition permits will improve Outcome: Not Progressing   Problem: Self-Concept: Goal: Will verbalize positive feelings about self Outcome: Not Progressing

## 2018-12-20 NOTE — Progress Notes (Signed)
Recreation Therapy Notes     Date: 12/20/2018  Time: 9:30 am   Location: Craft room   Behavioral response: N/A   Intervention Topic: Stress Management   Discussion/Intervention: Patient did not attend group.   Clinical Observations/Feedback:  Patient did not attend group.   Roczen Waymire LRT/CTRS        Pernella Ackerley 12/20/2018 10:51 AM

## 2018-12-21 MED ORDER — HALOPERIDOL LACTATE 5 MG/ML IJ SOLN: 5.0000 mg | Freq: Four times a day (QID) | INTRAMUSCULAR | Status: DC | PRN

## 2018-12-21 MED ORDER — LORAZEPAM 2 MG/ML IJ SOLN: 2.0000 mg | Freq: Four times a day (QID) | INTRAMUSCULAR | Status: DC | PRN

## 2018-12-21 NOTE — Progress Notes (Signed)
D- Patient alert and oriented. Patient continues to present in a preoccupied mood on assessment. He slept through out the shift. No complaints to voice at this time. Patient denied any signs/symptoms of depression/anxiety at this time. Patient also denied SI, HI, AVH, and pain, stating "no, I save lives".   A- Scheduled medications administered to patient, per MD orders. Support and encouragement provided.  Routine safety checks conducted every 15 minutes.  Patient informed to notify staff with problems or concerns.  R- No adverse drug reactions noted. Patient contracts for safety at this time. Patient compliant with medications and treatment plan. Patient receptive, calm, and cooperative. Patient interacts well with others on the unit.  Patient remains safe at this time.

## 2018-12-21 NOTE — Plan of Care (Signed)
Patient talks disorganized, delusional and grandiose like "I have 37 degrees."Patient is at the Nurse's station door multiple times and repeatedly talks about the same thing.At times it is hard to redirect the patient.Denies SI,HI and AVH.Compliant with medications.Appetite and energy level good.Did not attend groups.Support and encouragement given.

## 2018-12-21 NOTE — [facility] Group Notes (Signed)
Emotional Regulation 12/21/2018 1PM  Type of Therapy/Topic:  Group Therapy:  Emotion Regulation  Participation Level:  Did Not Attend   Description of Group:   The purpose of this group is to assist patients in learning to regulate negative emotions and experience positive emotions. Patients will be guided to discuss ways in which they have been vulnerable to their negative emotions. These vulnerabilities will be juxtaposed with experiences of positive emotions or situations, and patients will be challenged to use positive emotions to combat negative ones. Special emphasis will be placed on coping with negative emotions in conflict situations, and patients will process healthy conflict resolution skills.  Therapeutic Goals: 1. Patient will identify two positive emotions or experiences to reflect on in order to balance out negative emotions 2. Patient will label two or more emotions that they find the most difficult to experience 3. Patient will demonstrate positive conflict resolution skills through discussion and/or role plays  Summary of Patient Progress:       Therapeutic Modalities:   Cognitive Behavioral Therapy Feelings Identification Dialectical Behavioral Therapy   Yvette Rack, LCSW 12/21/2018 2:07 PM

## 2018-12-21 NOTE — B[facility] Counselor (Addendum)
CSW attempted to complete the patient's PSA.  Patient remains psychotic at this time.  Patient began stating that he wanted this CSW to contact "Maryan Char, the [employer] and the [facility].  And my nephew Quillian Quince, just Google him.  I want two white Expeditions sitting outside in the lot so I can go round up my other vehicles."  CSW attempted to have patient sign consent for his daughter, who is his emergency contact and had contact information in the chart. Patient continued to decline.   [name], MSW, LCSW 12/21/2018 9:31 AM

## 2018-12-21 NOTE — Progress Notes (Signed)
Recreation Therapy Notes   Date: 12/21/2018  Time: 9:30 am   Location: Craft room   Behavioral response: N/A   Intervention Topic: Necessities    Discussion/Intervention: Patient did not attend group.   Clinical Observations/Feedback:  Patient did not attend group.   Alieyah Spader LRT/CTRS        Briget Shaheed 12/21/2018 10:54 AM

## 2018-12-21 NOTE — Progress Notes (Signed)
[facility] MD Progress Note  12/21/2018 4:18 PM Austin Rhodes  MRN:  [id number] Subjective: Patient seen chart reviewed.  This 68 year old man with bipolar disorder continues to present as manic.  He talks constantly very pressured and is quite agitated.  Has not reached the point of being violent or threatening but is pretty intrusive.  Every single thing he talks about is a grandiose delusion.  He did get some sleep finally.  No physical complaints.  Has taken some medicine. Principal Problem: Affective psychosis, bipolar (HCC) Diagnosis: Principal Problem:   Affective psychosis, bipolar (HCC) Active Problems:   HTN (hypertension)  Total Time spent with patient: 30 minutes  Past Psychiatric History: History of recurrent episodes of psychotic mania  Past Medical History:  Past Medical History:  Diagnosis Date  . Bipolar disorder (HCC)   . Coronary artery disease   . GERD (gastroesophageal reflux disease)   . History of multiple strokes   . PTSD (post-traumatic stress disorder)     Past Surgical History:  Procedure Laterality Date  . CARDIAC CATHETERIZATION     Family History: History reviewed. No pertinent family history. Family Psychiatric  History: See previous Social History:  Social History   Substance and Sexual Activity  Alcohol Use No     Social History   Substance and Sexual Activity  Drug Use Never    Social History   Socioeconomic History  . Marital status: Legally Separated    Spouse name: Not on file  . Number of children: Not on file  . Years of education: Not on file  . Highest education level: Not on file  Occupational History  . Not on file  Social Needs  . Financial resource strain: Not on file  . Food insecurity    Worry: Not on file    Inability: Not on file  . Transportation needs    Medical: Not on file    Non-medical: Not on file  Tobacco Use  . Smoking status: Never Smoker  . Smokeless tobacco: Never Used  Substance and Sexual  Activity  . Alcohol use: No  . Drug use: Never  . Sexual activity: Not on file  Lifestyle  . Physical activity    Days per week: Not on file    Minutes per session: Not on file  . Stress: Not on file  Relationships  . Social Musician on phone: Not on file    Gets together: Not on file    Attends religious service: Not on file    Active member of club or organization: Not on file    Attends meetings of clubs or organizations: Not on file    Relationship status: Not on file  Other Topics Concern  . Not on file  Social History Narrative  . Not on file   Additional Social History:                         Sleep: Fair  Appetite:  Fair  Current Medications: Current Facility-Administered Medications  Medication Dose Route Frequency Provider Last Rate Last Dose  . acetaminophen (TYLENOL) tablet 650 mg  650 mg Oral Q6H PRN Charm Rings, NP   650 mg at 12/20/18 1539  . alum & mag hydroxide-simeth (MAALOX/MYLANTA) 200-200-20 MG/5ML suspension 30 mL  30 mL Oral Q4H PRN Charm Rings, NP      . clopidogrel (PLAVIX) tablet 75 mg  75 mg Oral Daily Charm Rings, NP  75 mg at 12/21/18 0802  . divalproex (DEPAKOTE) DR tablet 500 mg  500 mg Oral Q12H Patrecia Pour, NP   500 mg at 12/21/18 0802  . haloperidol lactate (HALDOL) injection 5 mg  5 mg Intramuscular Q6H PRN Clapacs, John T, MD      . influenza vaccine adjuvanted (FLUAD) injection 0.5 mL  0.5 mL Intramuscular Tomorrow-1000 Clapacs, John T, MD      . LORazepam (ATIVAN) tablet 2 mg  2 mg Oral Q6H PRN Clapacs, Madie Reno, MD   2 mg at 12/21/18 1005  . magnesium hydroxide (MILK OF MAGNESIA) suspension 30 mL  30 mL Oral Daily PRN Patrecia Pour, NP      . metoprolol succinate (TOPROL-XL) 24 hr tablet 25 mg  25 mg Oral Daily Clapacs, Madie Reno, MD   25 mg at 12/21/18 0802  . multivitamin with minerals tablet 1 tablet  1 tablet Oral Daily Patrecia Pour, NP   1 tablet at 12/21/18 0802  . QUEtiapine (SEROQUEL)  tablet 400 mg  400 mg Oral QHS Clapacs, John T, MD      . rosuvastatin (CRESTOR) tablet 10 mg  10 mg Oral Daily Patrecia Pour, NP   10 mg at 12/20/18 1840    Lab Results:  Results for orders placed or performed during the hospital encounter of 12/18/18 (from the past 48 hour(s))  Glucose, capillary     Status: Abnormal   Collection Time: 12/20/18  8:57 AM  Result Value Ref Range   Glucose-Capillary 174 (H) 70 - 99 mg/dL    Blood Alcohol level:  Lab Results  Component Value Date   ETH <10 12/16/2018   ETH <10 16/60/6301    Metabolic Disorder Labs: Lab Results  Component Value Date   HGBA1C 5.5 11/03/2017   MPG 111 11/03/2017   MPG 123 01/29/2009   No results found for: PROLACTIN Lab Results  Component Value Date   CHOL 133 11/03/2017   TRIG 148 11/03/2017   HDL 44 11/03/2017   CHOLHDL 3.0 11/03/2017   VLDL 30 11/03/2017   LDLCALC 59 11/03/2017   LDLCALC 39 05/10/2015    Physical Findings: AIMS: Facial and Oral Movements Muscles of Facial Expression: None, normal Lips and Perioral Area: None, normal Jaw: None, normal Tongue: None, normal,Extremity Movements Upper (arms, wrists, hands, fingers): None, normal Lower (legs, knees, ankles, toes): None, normal, Trunk Movements Neck, shoulders, hips: None, normal, Overall Severity Severity of abnormal movements (highest score from questions above): None, normal Incapacitation due to abnormal movements: None, normal Patient's awareness of abnormal movements (rate only patient's report): No Awareness, Dental Status Current problems with teeth and/or dentures?: No Does patient usually wear dentures?: No  CIWA:    COWS:     Musculoskeletal: Strength & Muscle Tone: within normal limits Gait & Station: normal Patient leans: N/A  Psychiatric Specialty Exam: Physical Exam  Nursing note and vitals reviewed. Constitutional: He appears well-developed and well-nourished.  HENT:  Head: Normocephalic and atraumatic.   Eyes: Pupils are equal, round, and reactive to light. Conjunctivae are normal.  Neck: Normal range of motion.  Cardiovascular: Regular rhythm and normal heart sounds.  Respiratory: Effort normal. No respiratory distress.  GI: Soft.  Musculoskeletal: Normal range of motion.  Neurological: He is alert.  Skin: Skin is warm and dry.  Psychiatric: His affect is labile and inappropriate. His speech is rapid and/or pressured and tangential. He is agitated. He is not aggressive. Thought content is paranoid and delusional. Cognition and  memory are impaired. He expresses impulsivity and inappropriate judgment. He expresses no homicidal and no suicidal ideation.    Review of Systems  Constitutional: Negative.   HENT: Negative.   Eyes: Negative.   Respiratory: Negative.   Cardiovascular: Negative.   Gastrointestinal: Negative.   Musculoskeletal: Negative.   Skin: Negative.   Neurological: Negative.   Psychiatric/Behavioral: Negative.     Blood pressure 127/90, pulse 100, temperature 97.7 F (36.5 C), temperature source Oral, resp. rate 17, height 5\' 9"  (1.753 m), weight 79.4 kg, SpO2 100 %.Body mass index is 25.84 kg/m.  General Appearance: Disheveled  Eye Contact:  Fair  Speech:  Pressured  Volume:  Increased  Mood:  Euphoric  Affect:  Inappropriate and Labile  Thought Process:  Disorganized  Orientation:  Full (Time, Place, and Person)  Thought Content:  Illogical, Delusions, Paranoid Ideation, Rumination and Tangential  Suicidal Thoughts:  No  Homicidal Thoughts:  No  Memory:  Immediate;   Fair Recent;   Poor Remote;   Poor  Judgement:  Impaired  Insight:  Shallow  Psychomotor Activity:  Restlessness  Concentration:  Concentration: Poor  Recall:  Poor  Fund of Knowledge:  Good  Language:  Good  Akathisia:  No  Handed:  Right  AIMS (if indicated):     Assets:  Desire for Improvement Housing Resilience Social Support  ADL's:  Impaired  Cognition:  Impaired,  Mild  Sleep:   Number of Hours: 8.25     Treatment Plan Summary: Daily contact with patient to assess and evaluate symptoms and progress in treatment, Medication management and Plan I spoke with his daughter today.  She called and reach me.  She told me about how this had been a pattern in the past often happens this time of year and that it came on very quickly.  Apparently he was asymptomatic even a week or so ago.  She has good understanding and insight and is encouraging of his getting complete treatment.  She assures us she is taking care of the legal problems.  1 problem with this patient right now is that it is very difficult to redirect him.  He is intrusive and demanding at times and can be a little threatening.  We have added IM as needed medicine to help control the agitation.  Mordecai RasmussenJohn Clapacs, MD 12/21/2018, 4:18 PM

## 2018-12-21 NOTE — Plan of Care (Signed)
Support is provided and education is given, patient is complying with care , patient is safe in the unit , sleep is restful through the night only requires routine visual checks.   Problem: Education: Goal: Knowledge of [facility] Education information/materials will improve Outcome: Progressing Goal: Emotional status will improve Outcome: Progressing Goal: Mental status will improve Outcome: Progressing Goal: Verbalization of understanding the information provided will improve Outcome: Progressing   Problem: Activity: Goal: Interest or engagement in activities will improve Outcome: Progressing Goal: Sleeping patterns will improve Outcome: Progressing   Problem: Coping: Goal: Ability to verbalize frustrations and anger appropriately will improve Outcome: Progressing Goal: Ability to demonstrate self-control will improve Outcome: Progressing   Problem: Health Behavior/Discharge Planning: Goal: Identification of resources available to assist in meeting health care needs will improve Outcome: Progressing Goal: Compliance with treatment plan for underlying cause of condition will improve Outcome: Progressing   Problem: Physical Regulation: Goal: Ability to maintain clinical measurements within normal limits will improve Outcome: Progressing   Problem: Safety: Goal: Periods of time without injury will increase Outcome: Progressing   Problem: Activity: Goal: Will verbalize the importance of balancing activity with adequate rest periods Outcome: Progressing   Problem: Education: Goal: Will be free of psychotic symptoms Outcome: Progressing Goal: Knowledge of the prescribed therapeutic regimen will improve Outcome: Progressing   Problem: Coping: Goal: Coping ability will improve Outcome: Progressing Goal: Will verbalize feelings Outcome: Progressing   Problem: Health Behavior/Discharge Planning: Goal: Compliance with prescribed medication regimen will  improve Outcome: Progressing   Problem: Nutritional: Goal: Ability to achieve adequate nutritional intake will improve Outcome: Progressing   Problem: Role Relationship: Goal: Ability to communicate needs accurately will improve Outcome: Progressing Goal: Ability to interact with others will improve Outcome: Progressing   Problem: Safety: Goal: Ability to redirect hostility and anger into socially appropriate behaviors will improve Outcome: Progressing Goal: Ability to remain free from injury will improve Outcome: Progressing   Problem: Self-Care: Goal: Ability to participate in self-care as condition permits will improve Outcome: Progressing   Problem: Self-Concept: Goal: Will verbalize positive feelings about self Outcome: Progressing

## 2018-12-22 LAB — COMPREHENSIVE METABOLIC PANEL
ALT: 26 U/L (ref 0–44)
AST: 24 U/L (ref 15–41)
Albumin: 4.4 g/dL (ref 3.5–5.0)
Alkaline Phosphatase: 56 U/L (ref 38–126)
Anion gap: 13 (ref 5–15)
BUN: 16 mg/dL (ref 8–23)
CO2: 26 mmol/L (ref 22–32)
Calcium: 9.4 mg/dL (ref 8.9–10.3)
Chloride: 97 mmol/L — ABNORMAL LOW (ref 98–111)
Creatinine, Ser: 1.08 mg/dL (ref 0.61–1.24)
GFR calc Af Amer: >60 mL/min (ref >60)
GFR calc non Af Amer: >60 mL/min (ref >60)
Glucose, Bld: 124 mg/dL — ABNORMAL HIGH (ref 70–99)
Potassium: 4 mmol/L (ref 3.5–5.1)
Sodium: 136 mmol/L (ref 135–145)
Total Bilirubin: 0.7 mg/dL (ref 0.3–1.2)
Total Protein: 7.8 g/dL (ref 6.5–8.1)

## 2018-12-22 MED ORDER — QUETIAPINE FUMARATE 200 MG PO TABS
500.0000 mg | ORAL_TABLET | Freq: Every day | ORAL | Status: DC
  Administered 2018-12-22 – 2018-12-24 (×3): 500 mg via ORAL
  Filled 2018-12-22 (×3): qty 2
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
## 2018-12-22 NOTE — BHH Counselor (Signed)
CSW attempted to meet with patient to discuss signing consent allowing CSW to speak with his daughter.  Pt again declined to sign for his daughter and requested that CSW google his nephew and contact him.    Pt became upset when CSW informed that she was not going to be able to Google the nephew and pt "fired" CSW stating "I bought [employer] yesterday, you will do it.  You will go in the hall right now and find a phone and Google it."  CSW was able to redirect patient, however, pt continued to decline to sign paperwork.  CSW notes that physician has not put in a capacity note allowing for CSW to contact collaterals.   [name], MSW, LCSW 12/22/2018 11:05 AM

## 2018-12-22 NOTE — Progress Notes (Signed)
[facility] MD Progress Note  12/22/2018 2:45 PM KEAUN SCHNABEL  MRN:  [id number] Subjective: Patient seen and chart reviewed.  Follow-up for this gentleman with bipolar mania.  Patient continues to be remarkably psychotic.  His conversation continues to be almost entirely concerned with declaring himself to be a multibillionaire, he owns this and that property all over the world, he is a Designer, fashion/clothing. etc.  Today he did not seem quite as angry so I tried confronting some of these things very gently offering empathy but telling him that none of those things that he said were true.  I was gratified that he did not become enraged but disappointed that of course he continued to insist on their veracity.  Patient continues to take medication evidently.  Does not cause any real trouble on the unit. 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 Bipolar disorder ([place])   . Coronary artery disease   . GERD (gastroesophageal reflux disease)   . History of multiple strokes   . PTSD (post-traumatic stress disorder)     Past Surgical History:  Procedure Laterality Date  . CARDIAC CATHETERIZATION     Family History: History reviewed. No pertinent family history. Family Psychiatric  History: See previous Social History:  Social History   Substance and Sexual Activity  Alcohol Use No     Social History   Substance and Sexual Activity  Drug Use Never    Social History   Socioeconomic History  . Marital status: Legally Separated    Spouse name: Not on file  . Number of children: Not on file  . Years of education: Not on file  . Highest education level: Not on file  Occupational  History  . Not on file  Social Needs  . Financial resource strain: Not on file  . Food insecurity    Worry: Not on file    Inability: Not on file  . Transportation needs    Medical: Not on file    Non-medical: Not on file  Tobacco Use  . Smoking status: Never Smoker  . Smokeless tobacco: Never Used  Substance and Sexual Activity  . Alcohol use: No  . Drug use: Never  . Sexual activity: Not on file  Lifestyle  . Physical activity    Days per week: Not on file    Minutes per session: Not on file  . Stress: Not on file  Relationships  . Social Herbalist on phone: Not on file    Gets together: Not on file    Attends religious service: Not on file    Active member of club or organization: Not on file    Attends meetings of clubs or organizations: Not on file    Relationship status: Not on file  Other Topics Concern  . Not on file  Social History Narrative  . Not on file   Additional Social History:                         Sleep: Fair  Appetite:  Fair  Current Medications: Current Facility-Administered Medications  Medication Dose Route Frequency Provider Last Rate Last Dose  . acetaminophen (TYLENOL) tablet  650 mg  650 mg Oral Q6H PRN Charm Rings, NP   650 mg at 12/20/18 1539  . alum & mag hydroxide-simeth (MAALOX/MYLANTA) 200-200-20 MG/5ML suspension 30 mL  30 mL Oral Q4H PRN Charm Rings, NP      . clopidogrel (PLAVIX) tablet 75 mg  75 mg Oral Daily Charm Rings, NP   75 mg at 12/22/18 0911  . divalproex (DEPAKOTE) DR tablet 500 mg  500 mg Oral Q12H Charm Rings, NP   500 mg at 12/22/18 0258  . haloperidol lactate (HALDOL) injection 5 mg  5 mg Intramuscular Q6H PRN Clapacs, John T, MD      . influenza vaccine adjuvanted (FLUAD) injection 0.5 mL  0.5 mL Intramuscular Tomorrow-1000 Clapacs, John T, MD      . LORazepam (ATIVAN) tablet 2 mg  2 mg Oral Q6H PRN Clapacs, Jackquline Denmark, MD   2 mg at 12/21/18 1711  . magnesium hydroxide (MILK OF  MAGNESIA) suspension 30 mL  30 mL Oral Daily PRN Charm Rings, NP      . metoprolol succinate (TOPROL-XL) 24 hr tablet 25 mg  25 mg Oral Daily Clapacs, Jackquline Denmark, MD   25 mg at 12/22/18 0911  . multivitamin with minerals tablet 1 tablet  1 tablet Oral Daily Charm Rings, NP   1 tablet at 12/22/18 0911  . QUEtiapine (SEROQUEL) tablet 500 mg  500 mg Oral QHS Clapacs, John T, MD      . rosuvastatin (CRESTOR) tablet 10 mg  10 mg Oral Daily Charm Rings, NP   10 mg at 12/21/18 1710    Lab Results: No results found for this or any previous visit (from the past 48 hour(s)).  Blood Alcohol level:  Lab Results  Component Value Date   ETH <10 12/16/2018   ETH <10 10/29/2017    Metabolic Disorder Labs: Lab Results  Component Value Date   HGBA1C 5.5 11/03/2017   MPG 111 11/03/2017   MPG 123 01/29/2009   No results found for: PROLACTIN Lab Results  Component Value Date   CHOL 133 11/03/2017   TRIG 148 11/03/2017   HDL 44 11/03/2017   CHOLHDL 3.0 11/03/2017   VLDL 30 11/03/2017   LDLCALC 59 11/03/2017   LDLCALC 39 05/10/2015    Physical Findings: AIMS: Facial and Oral Movements Muscles of Facial Expression: None, normal Lips and Perioral Area: None, normal Jaw: None, normal Tongue: None, normal,Extremity Movements Upper (arms, wrists, hands, fingers): None, normal Lower (legs, knees, ankles, toes): None, normal, Trunk Movements Neck, shoulders, hips: None, normal, Overall Severity Severity of abnormal movements (highest score from questions above): None, normal Incapacitation due to abnormal movements: None, normal Patient's awareness of abnormal movements (rate only patient's report): No Awareness, Dental Status Current problems with teeth and/or dentures?: No Does patient usually wear dentures?: No  CIWA:    COWS:     Musculoskeletal: Strength & Muscle Tone: within normal limits Gait & Station: normal Patient leans: N/A  Psychiatric Specialty Exam: Physical Exam   Nursing note and vitals reviewed. Constitutional: He appears well-developed and well-nourished.  HENT:  Head: Normocephalic and atraumatic.  Eyes: Pupils are equal, round, and reactive to light. Conjunctivae are normal.  Neck: Normal range of motion.  Cardiovascular: Regular rhythm and normal heart sounds.  Respiratory: Effort normal.  GI: Soft.  Musculoskeletal: Normal range of motion.  Neurological: He is alert.  Skin: Skin is warm and dry.  Psychiatric: His affect is blunt. His speech  is rapid and/or pressured and tangential. He is agitated. Thought content is paranoid and delusional. Cognition and memory are impaired. He expresses impulsivity and inappropriate judgment. He expresses no homicidal and no suicidal ideation.    Review of Systems  Constitutional: Negative.   HENT: Negative.   Eyes: Negative.   Respiratory: Negative.   Cardiovascular: Negative.   Gastrointestinal: Negative.   Musculoskeletal: Negative.   Skin: Negative.   Neurological: Negative.   Psychiatric/Behavioral: Negative for depression, hallucinations, memory loss, substance abuse and suicidal ideas. The patient is not nervous/anxious and does not have insomnia.     Blood pressure 130/86, pulse (!) 104, temperature 97.7 F (36.5 C), temperature source Oral, resp. rate 18, height 5\' 9"  (1.753 m), weight 79.4 kg, SpO2 100 %.Body mass index is 25.84 kg/m.  General Appearance: Casual  Eye Contact:  Good  Speech:  Pressured  Volume:  Increased  Mood:  Euthymic  Affect:  Congruent  Thought Process:  Disorganized  Orientation:  Other:  Interestingly, he is quite aware that he is on a psychiatric unit of the hospital but refuses to make the connection between that and the assertion that he is suffering from a mental illness.  Thought Content:  Illogical, Delusions, Ideas of Reference:   Paranoia, Paranoid Ideation, Rumination and Tangential  Suicidal Thoughts:  No  Homicidal Thoughts:  No  Memory:  Immediate;    Fair Recent;   Poor Remote;   Fair  Judgement:  Impaired  Insight:  Shallow  Psychomotor Activity:  Restlessness  Concentration:  Concentration: Fair  Recall:  FiservFair  Fund of Knowledge:  Fair  Language:  Fair  Akathisia:  No  Handed:  Right  AIMS (if indicated):     Assets:  Health and safety inspectorinancial Resources/Insurance Housing Physical Health Resilience Social Support  ADL's:  Impaired  Cognition:  Impaired,  Mild  Sleep:  Number of Hours: 6.75     Treatment Plan Summary: Daily contact with patient to assess and evaluate symptoms and progress in treatment, Medication management and Plan All of my efforts to form some kind of rational rapport with the patient did not seem to make much progress although at least today he did not become enraged with me.  I am going to increase his Seroquel to 500 mg at night.  We need to check a Depakote level tomorrow morning so we know where we are with that.  If he has a reasonable Depakote level I would probably add lithium but if he is in the low range we will probably just boost up the Depakote for now.  Mordecai RasmussenJohn Clapacs, MD 12/22/2018, 2:45 PM

## 2018-12-22 NOTE — Plan of Care (Signed)
Pt denies depression, anxiety, SI, HI and AVH. Pt was educated on care plan. Collier Bullock RN Problem: Education: Goal: Knowledge of [facility] Education information/materials will improve Outcome: Not Progressing Goal: Emotional status will improve Outcome: Not Progressing Goal: Mental status will improve Outcome: Not Progressing Goal: Verbalization of understanding the information provided will improve Outcome: Not Progressing   Problem: Activity: Goal: Interest or engagement in activities will improve Outcome: Not Progressing Goal: Sleeping patterns will improve Outcome: Not Progressing   Problem: Coping: Goal: Ability to verbalize frustrations and anger appropriately will improve Outcome: Not Progressing Goal: Ability to demonstrate self-control will improve Outcome: Not Progressing   Problem: Health Behavior/Discharge Planning: Goal: Identification of resources available to assist in meeting health care needs will improve Outcome: Not Progressing Goal: Compliance with treatment plan for underlying cause of condition will improve Outcome: Not Progressing   Problem: Physical Regulation: Goal: Ability to maintain clinical measurements within normal limits will improve Outcome: Not Progressing   Problem: Safety: Goal: Periods of time without injury will increase Outcome: Progressing   Problem: Activity: Goal: Will verbalize the importance of balancing activity with adequate rest periods Outcome: Not Progressing   Problem: Education: Goal: Will be free of psychotic symptoms Outcome: Not Progressing Goal: Knowledge of the prescribed therapeutic regimen will improve Outcome: Not Progressing   Problem: Coping: Goal: Coping ability will improve Outcome: Not Progressing Goal: Will verbalize feelings Outcome: Not Progressing   Problem: Health Behavior/Discharge Planning: Goal: Compliance with prescribed medication regimen will improve Outcome: Not  Progressing   Problem: Nutritional: Goal: Ability to achieve adequate nutritional intake will improve Outcome: Not Progressing   Problem: Role Relationship: Goal: Ability to communicate needs accurately will improve Outcome: Not Progressing Goal: Ability to interact with others will improve Outcome: Not Progressing   Problem: Safety: Goal: Ability to redirect hostility and anger into socially appropriate behaviors will improve Outcome: Not Progressing Goal: Ability to remain free from injury will improve Outcome: Not Progressing   Problem: Self-Care: Goal: Ability to participate in self-care as condition permits will improve Outcome: Not Progressing   Problem: Self-Concept: Goal: Will verbalize positive feelings about self Outcome: Not Progressing

## 2018-12-22 NOTE — [facility] Group Notes (Signed)
LCSW Group Therapy Note  12/22/2018 11:26 AM  Type of Therapy/Topic:  Group Therapy:  Balance in Life  Participation Level:  Did Not Attend  Description of Group:    This group will address the concept of balance and how it feels and looks when one is unbalanced. Patients will be encouraged to process areas in their lives that are out of balance and identify reasons for remaining unbalanced. Facilitators will guide patients in utilizing problem-solving interventions to address and correct the stressor making their life unbalanced. Understanding and applying boundaries will be explored and addressed for obtaining and maintaining a balanced life. Patients will be encouraged to explore ways to assertively make their unbalanced needs known to significant others in their lives, using other group members and facilitator for support and feedback.  Therapeutic Goals: 1. Patient will identify two or more emotions or situations they have that consume much of in their lives. 2. Patient will identify signs/triggers that life has become out of balance:  3. Patient will identify two ways to set boundaries in order to achieve balance in their lives:  4. Patient will demonstrate ability to communicate their needs through discussion and/or role plays  Summary of Patient Progress: x     Therapeutic Modalities:   Cognitive Behavioral Therapy Solution-Focused Therapy Assertiveness Training  Skye Rodarte, MSW, LCSW Clinical Social Work 12/22/2018 11:26 AM   

## 2018-12-22 NOTE — Progress Notes (Deleted)
[facility] MD Progress Note  12/22/2018 2:49 PM Austin Rhodes  MRN:  [id number]   Subjective: This is a follow-up on a patient diagnosed with affective psychosis bipolar.  Patient immediately reports to me that he feels that he needs a walker and that when he has been in the multiple hospitals that he has been in in the past he is usually needed a walker to ambulate around.  Patient attempts to describe different facilities that he has been in and as he remembers 1 thing he jumps to another topic and uses loose associations to describe everything from the type of facility he was then, to the food, to the people that worked there.  Patient also stated that he has been worried about being in the hospital because he has made himself a organ donor and he is afraid that people, take his organs and pulled his skin off and give it to someone else before he is dead.  Principal Problem: Affective psychosis, bipolar ([facility]) Diagnosis: Principal Problem:   Affective psychosis, bipolar ([facility]) Active Problems:   HTN (hypertension)  Total Time spent with patient: 30 minutes  Past Psychiatric History: Patient has a history of bipolar disorder and has been seen both at our hospital and other hospitals in the past with similar symptoms no known history of suicide attempts  Past Medical History:  Past Medical History:  Diagnosis Date  . Bipolar disorder ([facility])   . Coronary artery disease   . GERD (gastroesophageal reflux disease)   . History of multiple strokes   . PTSD (post-traumatic stress disorder)     Past Surgical History:  Procedure Laterality Date  . CARDIAC CATHETERIZATION     Family History: History reviewed. No pertinent family history. Family Psychiatric  History: None known Social History:  Social History   Substance and Sexual Activity  Alcohol Use No     Social History   Substance and Sexual Activity  Drug Use Never    Social History   Socioeconomic History  . Marital status:  Legally Separated    Spouse name: Not on file  . Number of children: Not on file  . Years of education: Not on file  . Highest education level: Not on file  Occupational History  . Not on file  Social Needs  . Financial resource strain: Not on file  . Food insecurity    Worry: Not on file    Inability: Not on file  . Transportation needs    Medical: Not on file    Non-medical: Not on file  Tobacco Use  . Smoking status: Never Smoker  . Smokeless tobacco: Never Used  Substance and Sexual Activity  . Alcohol use: No  . Drug use: Never  . Sexual activity: Not on file  Lifestyle  . Physical activity    Days per week: Not on file    Minutes per session: Not on file  . Stress: Not on file  Relationships  . Social Musician on phone: Not on file    Gets together: Not on file    Attends religious service: Not on file    Active member of club or organization: Not on file    Attends meetings of clubs or organizations: Not on file    Relationship status: Not on file  Other Topics Concern  . Not on file  Social History Narrative  . Not on file   Additional Social History:  Sleep: Fair  Appetite:  Fair  Current Medications: Current Facility-Administered Medications  Medication Dose Route Frequency Provider Last Rate Last Dose  . acetaminophen (TYLENOL) tablet 650 mg  650 mg Oral Q6H PRN Charm RingsLord, Jamison Y, NP   650 mg at 12/20/18 1539  . alum & mag hydroxide-simeth (MAALOX/MYLANTA) 200-200-20 MG/5ML suspension 30 mL  30 mL Oral Q4H PRN Charm RingsLord, Jamison Y, NP      . clopidogrel (PLAVIX) tablet 75 mg  75 mg Oral Daily Charm RingsLord, Jamison Y, NP   75 mg at 12/22/18 0911  . divalproex (DEPAKOTE) DR tablet 500 mg  500 mg Oral Q12H Charm RingsLord, Jamison Y, NP   500 mg at 12/22/18 16100911  . haloperidol lactate (HALDOL) injection 5 mg  5 mg Intramuscular Q6H PRN Clapacs, John T, MD      . influenza vaccine adjuvanted (FLUAD) injection 0.5 mL  0.5 mL  Intramuscular Tomorrow-1000 Clapacs, John T, MD      . LORazepam (ATIVAN) tablet 2 mg  2 mg Oral Q6H PRN Clapacs, Jackquline DenmarkJohn T, MD   2 mg at 12/21/18 1711  . magnesium hydroxide (MILK OF MAGNESIA) suspension 30 mL  30 mL Oral Daily PRN Charm RingsLord, Jamison Y, NP      . metoprolol succinate (TOPROL-XL) 24 hr tablet 25 mg  25 mg Oral Daily Clapacs, Jackquline DenmarkJohn T, MD   25 mg at 12/22/18 0911  . multivitamin with minerals tablet 1 tablet  1 tablet Oral Daily Charm RingsLord, Jamison Y, NP   1 tablet at 12/22/18 0911  . QUEtiapine (SEROQUEL) tablet 500 mg  500 mg Oral QHS Clapacs, John T, MD      . rosuvastatin (CRESTOR) tablet 10 mg  10 mg Oral Daily Charm RingsLord, Jamison Y, NP   10 mg at 12/21/18 1710    Lab Results: No results found for this or any previous visit (from the past 48 hour(s)).  Blood Alcohol level:  Lab Results  Component Value Date   ETH <10 12/16/2018   ETH <10 10/29/2017    Metabolic Disorder Labs: Lab Results  Component Value Date   HGBA1C 5.5 11/03/2017   MPG 111 11/03/2017   MPG 123 01/29/2009   No results found for: PROLACTIN Lab Results  Component Value Date   CHOL 133 11/03/2017   TRIG 148 11/03/2017   HDL 44 11/03/2017   CHOLHDL 3.0 11/03/2017   VLDL 30 11/03/2017   LDLCALC 59 11/03/2017   LDLCALC 39 05/10/2015    Physical Findings: AIMS: Facial and Oral Movements Muscles of Facial Expression: None, normal Lips and Perioral Area: None, normal Jaw: None, normal Tongue: None, normal,Extremity Movements Upper (arms, wrists, hands, fingers): None, normal Lower (legs, knees, ankles, toes): None, normal, Trunk Movements Neck, shoulders, hips: None, normal, Overall Severity Severity of abnormal movements (highest score from questions above): None, normal Incapacitation due to abnormal movements: None, normal Patient's awareness of abnormal movements (rate only patient's report): No Awareness, Dental Status Current problems with teeth and/or dentures?: No Does patient usually wear dentures?:  No  CIWA:    COWS:     Musculoskeletal: Strength & Muscle Tone: decreased Gait & Station: Patient reports feeling unsteadya nd he remained in the bed during the evaluation and a PT consult is ordered Patient leans: N/A  Psychiatric Specialty Exam: Physical Exam  Nursing note and vitals reviewed. Constitutional: He is oriented to person, place, and time. He appears well-developed and well-nourished.  Respiratory: Effort normal.  Musculoskeletal: Normal range of motion.  Neurological: He is alert and  oriented to person, place, and time.  Skin: Skin is warm.  Psychiatric: His speech is tangential. Thought content is paranoid. He expresses inappropriate judgment.    Review of Systems  Constitutional: Negative.   HENT: Negative.   Eyes: Negative.   Respiratory: Negative.   Cardiovascular: Negative.   Gastrointestinal: Negative.   Genitourinary: Negative.   Musculoskeletal: Negative.   Skin: Negative.   Neurological: Negative.   Endo/Heme/Allergies: Negative.   Psychiatric/Behavioral:       Paranoid    Blood pressure 130/86, pulse (!) 104, temperature 97.7 F (36.5 C), temperature source Oral, resp. rate 18, height 5\' 9"  (1.753 m), weight 79.4 kg, SpO2 100 %.Body mass index is 25.84 kg/m.  General Appearance: Disheveled  Eye Contact:  Good  Speech:  Pressured  Volume:  Normal  Mood:  Anxious  Affect:  Congruent  Thought Process:  Disorganized and Descriptions of Associations: Loose  Orientation:  Full (Time, Place, and Person)  Thought Content:  Paranoid Ideation and Tangential  Suicidal Thoughts:  No  Homicidal Thoughts:  No  Memory:  Immediate;   Fair Recent;   Fair Remote;   Fair  Judgement:  Impaired  Insight:  Lacking  Psychomotor Activity:  Decreased  Concentration:  Concentration: Poor  Recall:  Poor  Fund of Knowledge:  Poor  Language:  Fair  Akathisia:  No  Handed:  Right  AIMS (if indicated):     Assets:  Financial  Resources/Insurance Resilience Social Support  ADL's:  Impaired  Cognition:  Impaired,  Mild  Sleep:  Number of Hours: 6.75   Assessment: Patient presents lying in his bed and is pleasant, calm, and cooperative.  Patient has very loose associations when trying to describe the story and he is tangential.  Patient does not stay on topic very long and is hard to follow his story line.  He can be redirected but then will lose track of the story and move on to something else.  It was reported that the patient has been drinking excessive amounts of water and will have a CMP ordered to assess patient's electrolyte levels.  Patient had also reported that he feels that he needs to have some assistance with a walker because he is used one in the past and will have a PT consult ordered at this time.  Patient still seems to be confused about why he is brought here he did report some type of story of having issues with his roommate in the heat and then reported some thought about the lighting in the hallway however the topics are related to the group home but the patient seem to lose the thought process and moved on to something else.  Treatment Plan Summary: Daily contact with patient to assess and evaluate symptoms and progress in treatment and Medication management Ordered CMP due to excessive water intake Valproic acid level has been ordered and scheduled for 12/23/2018 Continue Depakote DR 500 mg p.o. every 12 hours for affective psychosis Continue Seroquel 500 mg p.o. nightly from psychosis Continue Crestor 10 mg p.o. daily for hyperlipidemia Continue Ativan 2 mg p.o. every 6 hours as needed for anxiety and sedation Continue metoprolol succinate 25 mg p.o. daily for hypertension Continue Plavix 75 mg p.o. daily Encourage group therapy participation Continue every 15 minute safety checks Continue Haldol IM 5 mg every 6 hours as needed for agitation   Lewis Shock, FNP 12/22/2018, 2:49 PM

## 2018-12-23 LAB — VALPROIC ACID LEVEL: Valproic Acid Lvl: 89 ug/mL (ref 50.0–100.0)

## 2018-12-23 MED ORDER — HALOPERIDOL 5 MG PO TABS
5.0000 mg | ORAL_TABLET | Freq: Four times a day (QID) | ORAL | Status: DC | PRN
  Administered 2018-12-23 – 2018-12-24 (×4): 5 mg via ORAL
  Filled 2018-12-23 (×4): qty 1

## 2018-12-23 MED ORDER — METOPROLOL SUCCINATE ER 25 MG PO TB24
50.0000 mg | ORAL_TABLET | Freq: Every day | ORAL | Status: DC
  Administered 2018-12-24: 50 mg via ORAL
  Filled 2018-12-23: qty 2

## 2018-12-23 MED ORDER — LITHIUM CARBONATE ER 300 MG PO TBCR
300.0000 mg | EXTENDED_RELEASE_TABLET | Freq: Two times a day (BID) | ORAL | Status: DC
  Administered 2018-12-23 – 2018-12-24 (×3): 300 mg via ORAL
  Filled 2018-12-23 (×4): qty 1

## 2018-12-23 NOTE — Progress Notes (Signed)
Pt. This morning visibly anxious, PRN medicine given for comfort and safety. Will continue to monitor.

## 2018-12-23 NOTE — Progress Notes (Signed)
Patient ID: Austin Rhodes, male   DOB: 1950/02/22, 68 y.o.   MRN: [id number] Brief progress note to document the patient's inability to make appropriate decisions.  Patient seen daily.  He continues to be extraordinarily psychotic to the degree that he really cannot be engaged in a rational conversation about almost anything.  Even the smallest matter immediately causes him to begin making bizarre and absurd grandiose boasts.  He cannot discuss his outpatient life in any rational manner.  Patient currently lacks the ability to think through any of his real needs and make any real decisions.  As a result he lacks capacity to make decisions and it is justified for the treatment team including social work to contact family, providers, or anyone else who could provide helpful relevant information to taking care of his current needs.

## 2018-12-23 NOTE — [facility] Group Notes (Signed)
LCSW Group Therapy Note  12/23/2018 1:00 PM  Type of Therapy and Topic:  Group Therapy:  Feelings around Relapse and Recovery  Participation Level:  Did Not Attend   Description of Group:    Patients in this group will discuss emotions they experience before and after a relapse. They will process how experiencing these feelings, or avoidance of experiencing them, relates to having a relapse. Facilitator will guide patients to explore emotions they have related to recovery. Patients will be encouraged to process which emotions are more powerful. They will be guided to discuss the emotional reaction significant others in their lives may have to their relapse or recovery. Patients will be assisted in exploring ways to respond to the emotions of others without this contributing to a relapse.  Therapeutic Goals: 1. Patient will identify two or more emotions that lead to a relapse for them 2. Patient will identify two emotions that result when they relapse 3. Patient will identify two emotions related to recovery 4. Patient will demonstrate ability to communicate their needs through discussion and/or role plays   Summary of Patient Progress: X  Therapeutic Modalities:   Cognitive Behavioral Therapy Solution-Focused Therapy Assertiveness Training Relapse Prevention Therapy   Nayquan Evinger, MSW, LCSW 12/23/2018 12:04 PM 

## 2018-12-23 NOTE — Plan of Care (Signed)
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, has not ask for any PRNs sleep is restful no distress, only require 15 minutes  safety checks no distress    Problem: Education: Goal: Knowledge of [place] Education information/materials will improve Outcome: Progressing Goal: Emotional status will improve Outcome: Progressing Goal: Mental status will improve Outcome: Progressing Goal: Verbalization of understanding the information provided will improve Outcome: Progressing   Problem: Activity: Goal: Interest or engagement in activities will improve Outcome: Progressing Goal: Sleeping patterns will improve Outcome: Progressing   Problem: Coping: Goal: Ability to verbalize frustrations and anger appropriately will improve Outcome: Progressing Goal: Ability to demonstrate self-control will improve Outcome: Progressing   Problem: Health Behavior/Discharge Planning: Goal: Identification of resources available to assist in meeting health care needs will improve Outcome: Progressing Goal: Compliance with treatment plan for underlying cause of condition will improve Outcome: Progressing   Problem: Physical Regulation: Goal: Ability to maintain clinical measurements within normal limits will improve Outcome: Progressing   Problem: Safety: Goal: Periods of time without injury will increase Outcome: Progressing   Problem: Activity: Goal: Will verbalize the importance of balancing activity with adequate rest periods Outcome: Progressing   Problem: Education: Goal: Will be free of psychotic symptoms Outcome: Progressing Goal: Knowledge of the prescribed therapeutic  regimen will improve Outcome: Progressing   Problem: Coping: Goal: Coping ability will improve Outcome: Progressing Goal: Will verbalize feelings Outcome: Progressing   Problem: Health Behavior/Discharge Planning: Goal: Compliance with prescribed medication regimen will improve Outcome: Progressing   Problem: Nutritional: Goal: Ability to achieve adequate nutritional intake will improve Outcome: Progressing   Problem: Role Relationship: Goal: Ability to communicate needs accurately will improve Outcome: Progressing Goal: Ability to interact with others will improve Outcome: Progressing   Problem: Safety: Goal: Ability to redirect hostility and anger into socially appropriate behaviors will improve Outcome: Progressing Goal: Ability to remain free from injury will improve Outcome: Progressing   Problem: Self-Care: Goal: Ability to participate in self-care as condition permits will improve Outcome: Progressing   Problem: Self-Concept: Goal: Will verbalize positive feelings about self Outcome: Progressing  .

## 2018-12-23 NOTE — Progress Notes (Signed)
[facility] MD Progress Note  12/23/2018 11:42 AM DEANGLEO PASSAGE  MRN:  [id number]   Subjective: This is a follow-up for patient with affective psychosis bipolar.  Patient continues reporting daily delusional grandiose ideas of having 37 degrees and multiple doctorate degrees and states that he has a PhD, as a DO as well as an MD.  Patient continues to have pressured speech and continues to nonstop have demands that he needs certain vehicles and his nephew contacted for his discharge.  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  Patient demands to be discharged today and he will no longer be spending nights here.  Patient states that he is not suicidal homicidal and he is not hallucinating and that there is no reason for him to stay in the hospital.  Principal Problem: Affective psychosis, bipolar (HCC) Diagnosis: Principal Problem:   Affective psychosis, bipolar (HCC) Active Problems:   HTN (hypertension)  Total Time spent with patient: 30 minutes  Past Psychiatric History: Patient has a history of bipolar disorder and has been seen both at our hospital and other hospitals in the past with similar symptoms no known history of suicide attempts  Past Medical History:  Past Medical History:  Diagnosis Date  . Bipolar disorder (HCC)   . Coronary artery disease   . GERD (gastroesophageal reflux disease)   . History of multiple strokes   . PTSD (post-traumatic stress disorder)     Past Surgical History:  Procedure Laterality Date  . CARDIAC CATHETERIZATION     Family History: History reviewed. No pertinent family history. Family Psychiatric  History: None known Social History:  Social History   Substance and Sexual Activity  Alcohol Use No     Social History   Substance and Sexual Activity  Drug Use Never    Social History   Socioeconomic History  . Marital status: Legally Separated    Spouse name: Not on file  . Number of children: Not on file   . Years of education: Not on file  . Highest education level: Not on file  Occupational History  . Not on file  Social Needs  . Financial resource strain: Not on file  . Food insecurity    Worry: Not on file    Inability: Not on file  . Transportation needs    Medical: Not on file    Non-medical: Not on file  Tobacco Use  . Smoking status: Never Smoker  . Smokeless tobacco: Never Used  Substance and Sexual Activity  . Alcohol use: No  . Drug use: Never  . Sexual activity: Not on file  Lifestyle  . Physical activity    Days per week: Not on file    Minutes per session: Not on file  . Stress: Not on file  Relationships  . Social Musician on phone: Not on file    Gets together: Not on file    Attends religious service: Not on file    Active member of club or organization: Not on file    Attends meetings of clubs or organizations: Not on file    Relationship status: Not on file  Other Topics Concern  . Not on file  Social History Narrative  . Not on file   Additional Social History:                         Sleep: Fair  Appetite:  Fair  Current  Medications: Current Facility-Administered Medications  Medication Dose Route Frequency Provider Last Rate Last Dose  . acetaminophen (TYLENOL) tablet 650 mg  650 mg Oral Q6H PRN Charm Rings, NP   650 mg at 12/20/18 1539  . alum & mag hydroxide-simeth (MAALOX/MYLANTA) 200-200-20 MG/5ML suspension 30 mL  30 mL Oral Q4H PRN Charm Rings, NP      . clopidogrel (PLAVIX) tablet 75 mg  75 mg Oral Daily Charm Rings, NP   75 mg at 12/23/18 0736  . divalproex (DEPAKOTE) DR tablet 500 mg  500 mg Oral Q12H Charm Rings, NP   500 mg at 12/23/18 0736  . haloperidol lactate (HALDOL) injection 5 mg  5 mg Intramuscular Q6H PRN Clapacs, John T, MD      . influenza vaccine adjuvanted (FLUAD) injection 0.5 mL  0.5 mL Intramuscular Tomorrow-1000 Clapacs, John T, MD      . lithium carbonate (LITHOBID) CR tablet  300 mg  300 mg Oral Q12H Keeyon Privitera, Gerlene Burdock, FNP      . LORazepam (ATIVAN) tablet 2 mg  2 mg Oral Q6H PRN Clapacs, Jackquline Denmark, MD   2 mg at 12/23/18 0736  . magnesium hydroxide (MILK OF MAGNESIA) suspension 30 mL  30 mL Oral Daily PRN Charm Rings, NP      . metoprolol succinate (TOPROL-XL) 24 hr tablet 25 mg  25 mg Oral Daily Clapacs, Jackquline Denmark, MD   25 mg at 12/23/18 0736  . multivitamin with minerals tablet 1 tablet  1 tablet Oral Daily Charm Rings, NP   1 tablet at 12/23/18 0736  . QUEtiapine (SEROQUEL) tablet 500 mg  500 mg Oral QHS Clapacs, Jackquline Denmark, MD   500 mg at 12/22/18 2051  . rosuvastatin (CRESTOR) tablet 10 mg  10 mg Oral Daily Charm Rings, NP   10 mg at 12/22/18 1730    Lab Results:  Results for orders placed or performed during the hospital encounter of 12/18/18 (from the past 48 hour(s))  Comprehensive metabolic panel     Status: Abnormal   Collection Time: 12/22/18  3:04 PM  Result Value Ref Range   Sodium 136 135 - 145 mmol/L   Potassium 4.0 3.5 - 5.1 mmol/L   Chloride 97 (L) 98 - 111 mmol/L   CO2 26 22 - 32 mmol/L   Glucose, Bld 124 (H) 70 - 99 mg/dL   BUN 16 8 - 23 mg/dL   Creatinine, Ser 1.61 0.61 - 1.24 mg/dL   Calcium 9.4 8.9 - 09.6 mg/dL   Total Protein 7.8 6.5 - 8.1 g/dL   Albumin 4.4 3.5 - 5.0 g/dL   AST 24 15 - 41 U/L   ALT 26 0 - 44 U/L   Alkaline Phosphatase 56 38 - 126 U/L   Total Bilirubin 0.7 0.3 - 1.2 mg/dL   GFR calc non Af Amer >60 >60 mL/min   GFR calc Af Amer >60 >60 mL/min   Anion gap 13 5 - 15    Comment: Performed at [facility], [address] [place], [place] [address]  Valproic acid level     Status: None   Collection Time: 12/23/18  7:43 AM  Result Value Ref Range   Valproic Acid Lvl 89 50.0 - 100.0 ug/mL    Comment: Performed at [facility], [address], [place], [place] [address]    Blood Alcohol level:  Lab Results  Component Value Date   [facility] <10 12/16/2018   ETH <  10 17/61/6073    Metabolic Disorder  Labs: Lab Results  Component Value Date   HGBA1C 5.5 11/03/2017   MPG 111 11/03/2017   MPG 123 01/29/2009   No results found for: PROLACTIN Lab Results  Component Value Date   CHOL 133 11/03/2017   TRIG 148 11/03/2017   HDL 44 11/03/2017   CHOLHDL 3.0 11/03/2017   VLDL 30 11/03/2017   LDLCALC 59 11/03/2017   LDLCALC 39 05/10/2015    Physical Findings: AIMS: Facial and Oral Movements Muscles of Facial Expression: None, normal Lips and Perioral Area: None, normal Jaw: None, normal Tongue: None, normal,Extremity Movements Upper (arms, wrists, hands, fingers): None, normal Lower (legs, knees, ankles, toes): None, normal, Trunk Movements Neck, shoulders, hips: None, normal, Overall Severity Severity of abnormal movements (highest score from questions above): None, normal Incapacitation due to abnormal movements: None, normal Patient's awareness of abnormal movements (rate only patient's report): No Awareness, Dental Status Current problems with teeth and/or dentures?: No Does patient usually wear dentures?: No  CIWA:    COWS:     Musculoskeletal: Strength & Muscle Tone: within normal limits Gait & Station: normal Patient leans: N/A  Psychiatric Specialty Exam: Physical Exam  Nursing note and vitals reviewed. Constitutional: He is oriented to person, place, and time. He appears well-developed and well-nourished.  Respiratory: Effort normal.  Musculoskeletal: Normal range of motion.  Neurological: He is alert and oriented to person, place, and time.  Skin: Skin is warm.  Psychiatric: His speech is tangential. Thought content is paranoid and delusional. He expresses inappropriate judgment.    Review of Systems  Constitutional: Negative.   HENT: Negative.   Eyes: Negative.   Respiratory: Negative.   Cardiovascular: Negative.   Gastrointestinal: Negative.   Genitourinary: Negative.   Musculoskeletal: Negative.   Skin: Negative.   Neurological: Negative.    Endo/Heme/Allergies: Negative.   Psychiatric/Behavioral: Negative.     Blood pressure (!) 139/97, pulse 97, temperature 98.5 F (36.9 C), temperature source Oral, resp. rate 17, height 5\' 9"  (1.753 m), weight 79.4 kg, SpO2 97 %.Body mass index is 25.84 kg/m.  General Appearance: Disheveled  Eye Contact:  Good  Speech:  Pressured  Volume:  Normal  Mood:  Anxious  Affect:  Congruent  Thought Process:  Disorganized and Descriptions of Associations: Loose  Orientation:  Full (Time, Place, and Person)  Thought Content:  Delusions, Paranoid Ideation, Tangential and grandiose  Suicidal Thoughts:  No  Homicidal Thoughts:  No  Memory:  Immediate;   Fair Recent;   Fair Remote;   Fair  Judgement:  Impaired  Insight:  Lacking  Psychomotor Activity:  Decreased  Concentration:  Concentration: Poor  Recall:  Poor  Fund of Knowledge:  Poor  Language:  Fair  Akathisia:  No  Handed:  Right  AIMS (if indicated):     Assets:  Financial Resources/Insurance Resilience Social Support  ADL's:  Intact  Cognition:  Impaired,  Mild  Sleep:  Number of Hours: 7   Assessment: Patient presents in the hallway talking to one of the staff having multiple demands again.  Patient has continued with severe delusional and grandiose thoughts.  Patient valproic acid level was checked yesterday and is 89.  Based on patient's continued presentation with his manic symptoms that starting lithium may benefit this patient.  I have started lithium at 300 mg p.o. twice daily.  Patient does become slightly irritated when he is told that he is not discharging today and he states that he will fire everyone  who does not let him leave the hospital.  Treatment Plan Summary: Daily contact with patient to assess and evaluate symptoms and progress in treatment and Medication management Ordered CMP due to excessive water intake Valproic acid level has been ordered and scheduled for 12/23/2018 Continue Depakote DR 500 mg p.o.  every 12 hours for affective psychosis Continue Seroquel 500 mg p.o. nightly from psychosis Continue Crestor 10 mg p.o. daily for hyperlipidemia Continue Ativan 2 mg p.o. every 6 hours as needed for anxiety and sedation Continue metoprolol succinate 25 mg p.o. daily for hypertension Continue Plavix 75 mg p.o. daily Start Lithium 300 mg PO BID for mania symptoms Encourage group therapy participation Continue every 15 minute safety checks Continue Haldol IM 5 mg every 6 hours as needed for agitation   Maryfrances Bunnellravis B Kalee Broxton, FNP 12/23/2018, 11:42 AM

## 2018-12-23 NOTE — Plan of Care (Signed)
D: Pt during assessments this morning is visibly animated, utilizing loose associations in his speech, and verbalizing multiple grandiose statements- one right after the other. 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 Pt. Able to answer questions this morning, but requires redirection to stay focused on answering questions. Pt. Endorses a, "great mood". Pt. Denies pain. Pt. Denies si/hi/avh, able to verbally contract for safety.   A: Q x 15 minute observation checks in place for safety. Patient was and is provided with education throughout shift, but non-accepting of most information provided.  Patient was and will be given/offered medications per orders. Patient was and is encouraged to attend groups, participate in unit activities and continue with plan of care. Pt. Chart and plans of care reviewed. Pt. Given support and encouragement.   R: Patient is complaint with medication and unit procedures with direction and encouragement, but difficult to redirect from inappropriate behaviors. Pt. Very pressured in his speech and intrusive to staff and peers often. Pt. Does observe to be eating good. Pt. Very demanding of staff and making grandiose statements of owning staff. No observations of responding to internal stimuli. Pt. Group attendance is poor.

## 2018-12-23 NOTE — Progress Notes (Signed)
Patient getting elevated in restlessness and intrusiveness up at the nursing station, difficult to redirect. Spoke to Provider [name] received to modify orders for PO hadol. Pt. Encouraged to take PRN medicine to reduce agitations. Will continue to monitor closely.

## 2018-12-24 MED ORDER — HALOPERIDOL 5 MG PO TABS
5.0000 mg | ORAL_TABLET | Freq: Two times a day (BID) | ORAL | Status: DC
  Administered 2018-12-24 (×2): 5 mg via ORAL
  Filled 2018-12-24 (×2): qty 1

## 2018-12-24 MED ORDER — METOPROLOL SUCCINATE ER 100 MG PO TB24: 100.0000 mg | ORAL_TABLET | Freq: Every day | ORAL | Status: DC

## 2018-12-24 MED ORDER — BENZTROPINE MESYLATE 1 MG PO TABS
0.5000 mg | ORAL_TABLET | Freq: Two times a day (BID) | ORAL | Status: DC
  Administered 2018-12-24 (×2): 0.5 mg via ORAL
  Filled 2018-12-24 (×2): qty 1

## 2018-12-24 NOTE — Progress Notes (Signed)
Reported patient medication refusals to Provider on site

## 2018-12-24 NOTE — Progress Notes (Signed)
D: Patient continues to be floridly psychotic. Passing numerous written "notices" to staff that he demands to have posted. Says he has 37 advanced degrees and that he earns one per year. Says he has to be discharged immediately to attend a cocktail party and "it's going to be a problem" if he is not allowed to go. Is aware that staff do not believe his claims, stating "I know you don't believe anything I say". Continues to be redirectable and medication compliant. A: Continue to monitor for safety R: Safety maintained.

## 2018-12-24 NOTE — Progress Notes (Signed)
[facility] MD Progress Note  12/24/2018 8:59 AM Austin Rhodes  MRN:  [id number] Subjective:    Patient mumbles but is very talkative, he has numerous grandiose statements he tells me that he is a doctor, that he is an [employer] agent so forth he rambles on about grandiose and diverse delusions.  He states he cannot take lithium "because of tremors" however he is largely compliant and displays no tremors today.  He denies auditory and visual hallucinations and in fact denies any illness whatsoever.  Denies thoughts of harming self but again his answers are generally irrelevant to the question but we do get those 2 answers out of him that he denies current hallucinations and he does not want to hurt himself at the present time.  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 Bipolar disorder ([place])   . Coronary artery disease   . GERD (gastroesophageal reflux disease)   . History of multiple strokes   . PTSD (post-traumatic stress disorder)     Past Surgical History:  Procedure Laterality Date  . CARDIAC CATHETERIZATION     Family History: History reviewed. No pertinent family history. Family Psychiatric  History: Denies Social History:  Social History   Substance and Sexual Activity  Alcohol Use No     Social History   Substance and Sexual Activity  Drug Use Never    Social History   Socioeconomic History  . Marital status: Legally Separated    Spouse name: Not on file  . Number of children: Not on file  . Years of education: Not on file  . Highest education level: Not on file  Occupational History  . Not on file  Social Needs  . Financial resource strain: Not on file  . Food insecurity    Worry: Not on file     Inability: Not on file  . Transportation needs    Medical: Not on file    Non-medical: Not on file  Tobacco Use  . Smoking status: Never Smoker  . Smokeless tobacco: Never Used  Substance and Sexual Activity  . Alcohol use: No  . Drug use: Never  . Sexual activity: Not on file  Lifestyle  . Physical activity    Days per week: Not on file    Minutes per session: Not on file  . Stress: Not on file  Relationships  . Social Herbalist on phone: Not on file    Gets together: Not on file    Attends religious service: Not on file    Active member of club or organization: Not on file    Attends meetings of clubs or organizations: Not on file    Relationship status: Not on file  Other Topics Concern  . Not on file  Social History Narrative  . Not on file   Additional Social History:                         Sleep: Fair  Appetite:  Fair  Current Medications: Current Facility-Administered Medications  Medication Dose Route Frequency Provider Last Rate Last Dose  . acetaminophen (TYLENOL) tablet 650 mg  650 mg Oral Q6H PRN Patrecia Pour, NP   650 mg at 12/23/18 2030  .  alum & mag hydroxide-simeth (MAALOX/MYLANTA) 200-200-20 MG/5ML suspension 30 mL  30 mL Oral Q4H PRN Charm RingsLord, Jamison Y, NP      . clopidogrel (PLAVIX) tablet 75 mg  75 mg Oral Daily Charm RingsLord, Jamison Y, NP   75 mg at 12/24/18 0830  . divalproex (DEPAKOTE) DR tablet 500 mg  500 mg Oral Q12H Charm RingsLord, Jamison Y, NP   500 mg at 12/23/18 2001  . haloperidol (HALDOL) tablet 5 mg  5 mg Oral Q6H PRN Money, Gerlene Burdockravis B, FNP   5 mg at 12/23/18 2001  . influenza vaccine adjuvanted (FLUAD) injection 0.5 mL  0.5 mL Intramuscular Tomorrow-1000 Clapacs, John T, MD      . lithium carbonate (LITHOBID) CR tablet 300 mg  300 mg Oral Q12H Money, Gerlene Burdockravis B, FNP   300 mg at 12/23/18 2001  . LORazepam (ATIVAN) tablet 2 mg  2 mg Oral Q6H PRN Clapacs, Jackquline DenmarkJohn T, MD   2 mg at 12/23/18 2001  . magnesium hydroxide (MILK OF MAGNESIA)  suspension 30 mL  30 mL Oral Daily PRN Charm RingsLord, Jamison Y, NP      . metoprolol succinate (TOPROL-XL) 24 hr tablet 50 mg  50 mg Oral Daily Clapacs, Jackquline DenmarkJohn T, MD   50 mg at 12/24/18 0829  . multivitamin with minerals tablet 1 tablet  1 tablet Oral Daily Charm RingsLord, Jamison Y, NP   1 tablet at 12/24/18 0830  . QUEtiapine (SEROQUEL) tablet 500 mg  500 mg Oral QHS Clapacs, Jackquline DenmarkJohn T, MD   500 mg at 12/23/18 2126  . rosuvastatin (CRESTOR) tablet 10 mg  10 mg Oral Daily Charm RingsLord, Jamison Y, NP   10 mg at 12/23/18 1818    Lab Results:  Results for orders placed or performed during the hospital encounter of 12/18/18 (from the past 48 hour(s))  Comprehensive metabolic panel     Status: Abnormal   Collection Time: 12/22/18  3:04 PM  Result Value Ref Range   Sodium 136 135 - 145 mmol/L   Potassium 4.0 3.5 - 5.1 mmol/L   Chloride 97 (L) 98 - 111 mmol/L   CO2 26 22 - 32 mmol/L   Glucose, Bld 124 (H) 70 - 99 mg/dL   BUN 16 8 - 23 mg/dL   Creatinine, Ser 1.611.08 0.61 - 1.24 mg/dL   Calcium 9.4 8.9 - 09.610.3 mg/dL   Total Protein 7.8 6.5 - 8.1 g/dL   Albumin 4.4 3.5 - 5.0 g/dL   AST 24 15 - 41 U/L   ALT 26 0 - 44 U/L   Alkaline Phosphatase 56 38 - 126 U/L   Total Bilirubin 0.7 0.3 - 1.2 mg/dL   GFR calc non Af Amer >60 >60 mL/min   GFR calc Af Amer >60 >60 mL/min   Anion gap 13 5 - 15    Comment: Performed at [facility], [address] [place], [place] [address]  Valproic acid level     Status: None   Collection Time: 12/23/18  7:43 AM  Result Value Ref Range   Valproic Acid Lvl 89 50.0 - 100.0 ug/mL    Comment: Performed at [facility], [address] [place], [place] [address]    Blood Alcohol level:  Lab Results  Component Value Date   [facility]   ETH <10 10/29/2017    Metabolic Disorder Labs: Lab Results  Component Value Date   HGBA1C 5.5 11/03/2017   MPG 111 11/03/2017   MPG 123 01/29/2009   No results found for:  PROLACTIN Lab Results  Component Value Date   CHOL  133 11/03/2017   TRIG 148 11/03/2017   HDL 44 11/03/2017   CHOLHDL 3.0 11/03/2017   VLDL 30 11/03/2017   LDLCALC 59 11/03/2017   LDLCALC 39 05/10/2015    Physical Findings: AIMS: Facial and Oral Movements Muscles of Facial Expression: None, normal Lips and Perioral Area: None, normal Jaw: None, normal Tongue: None, normal,Extremity Movements Upper (arms, wrists, hands, fingers): None, normal Lower (legs, knees, ankles, toes): None, normal, Trunk Movements Neck, shoulders, hips: None, normal, Overall Severity Severity of abnormal movements (highest score from questions above): None, normal Incapacitation due to abnormal movements: None, normal Patient's awareness of abnormal movements (rate only patient's report): No Awareness, Dental Status Current problems with teeth and/or dentures?: No Does patient usually wear dentures?: No  CIWA:    COWS:     Musculoskeletal: Strength & Muscle Tone: within normal limits Gait & Station: normal Patient leans: N/A  Psychiatric Specialty Exam: Physical Exam  ROS  Blood pressure (!) 139/97, pulse 97, temperature 98.5 F (36.9 C), temperature source Oral, resp. rate 17, height 5\' 9"  (1.753 m), weight 79.4 kg, SpO2 97 %.Body mass index is 25.84 kg/m.  General Appearance: Disheveled  Eye Contact:  Poor  Speech:  Garbled  Volume:  Decreased  Mood:  Hypomanic though calm in presentation  Affect:  Constricted  Thought Process:  Disorganized, Irrelevant and Descriptions of Associations: Loose  Orientation:  Other:  Person place situation  Thought Content:  Illogical and Delusions  Suicidal Thoughts:  No  Homicidal Thoughts:  No  Memory:  Immediate;   Poor Recent;   Poor Remote;   Fair  Judgement:  Poor  Insight:  Shallow  Psychomotor Activity:  Decreased  Concentration:  Concentration: Poor and Attention Span: Poor  Recall:  Poor  Fund of Knowledge:  Poor  Language:  Rambling and mumbling  Akathisia:  Negative  Handed:  Right   AIMS (if indicated):     Assets:  Physical Health Resilience Social Support  ADL's:  Intact  Cognition:  WNL-he had consumed with delusional believes  Sleep:  Number of Hours: 8     Treatment Plan Summary: Daily contact with patient to assess and evaluate symptoms and progress in treatment and Medication management  Generally treatment resistant chronic psychotic disorder we will address hypertension, use haloperidol, continue to monitor for safety.  No change in precautions continue reality based therapy  Elion Hocker, MD 12/24/2018, 8:59 AM

## 2018-12-24 NOTE — Progress Notes (Signed)
Patient alert and oriented x 2 with periods of confusion to place and situation, his thoughts are disorganized and incoherent, he appears to have some delusions, particularly grandeur ; he says  " I will buy everyone here a car" " I have 50 Rolex watches" he keeps telling peers different fantasy stories and he tries to convince them to believe it. Patient's speech is soft non pressured, he denies SI/HI/AVH no distress noted, he was compliant with evening medication 15 minutes safety checks maintained will continue to monitor.

## 2018-12-24 NOTE — B[facility] Group Notes (Signed)
LCSW Aftercare Discharge Planning Group Note   12/24/2018 100pm  Type of Group and Topic: Psychoeducational Group:  Discharge Planning  Participation Level:  Active  Description of Group  Discharge planning group reviews patient's anticipated discharge plans and assists patients to anticipate and address any barriers to wellness/recovery in the community.  Suicide prevention education is reviewed with patients in group.  Therapeutic Goals 1. Patients will state their anticipated discharge plan and mental health aftercare 2. Patients will identify potential barriers to wellness in the community setting 3. Patients will engage in problem solving, solution focused discussion of ways to anticipate and address barriers to wellness/recovery  Summary of Patient Progress: pt came very late to group but immediately started talking.  CSW had to redirect him and he did not interrupt anymore.  Pt wanting to share things with the entire group: "each of you have $5000 in a bank account" and went on to talk about smart managing of money.  He attempted to do this several other times.   Plan for Discharge/Comments:    Transportation Means:   Supports:  Therapeutic Modalities: Motivational Interviewing    Joanne Chars, LCSW 12/24/2018 2:26 PM

## 2018-12-24 NOTE — Plan of Care (Signed)
  Problem: Education: Goal: Knowledge of [place] [facility] Education information/materials will improve Outcome: Not Progressing Goal: Emotional status will improve Outcome: Not Progressing Goal: Mental status will improve Outcome: Not Progressing Goal: Verbalization of understanding the information provided will improve Outcome: Not Progressing  D: Patient continues to be floridly psychotic. Passing numerous written "notices" to staff that he demands to have posted. Says he has 37 advanced degrees and that he earns one per year. Says he has to be discharged immediately to attend a cocktail party and "it's going to be a problem" if he is not allowed to go. Is aware that staff do not believe his claims, stating "I know you don't believe anything I say". Continues to be redirectable and medication compliant. A: Continue to monitor for safety R: Safety maintained.

## 2018-12-24 NOTE — Plan of Care (Signed)
  Problem: Activity: Goal: Will verbalize the importance of balancing activity with adequate rest periods Outcome: Progressing  Patient appears much better h verbalized importance of rest and activity.

## 2018-12-24 NOTE — Tx Team (Signed)
Interdisciplinary Treatment and Diagnostic Plan Update  12/24/2018 Time of Session: 900am Austin Rhodes MRN: [id number]  Principal Diagnosis: Affective psychosis, bipolar (HCC)  Secondary Diagnoses: Principal Problem:   Affective psychosis, bipolar ([facility]) Active Problems:   HTN (hypertension)   Current Medications:  Current Facility-Administered Medications  Medication Dose Route Frequency Provider Last Rate Last Dose  . acetaminophen (TYLENOL) tablet 650 mg  650 mg Oral Q6H PRN Charm Rings, NP   650 mg at 12/23/18 2030  . alum & mag hydroxide-simeth (MAALOX/MYLANTA) 200-200-20 MG/5ML suspension 30 mL  30 mL Oral Q4H PRN Charm Rings, NP      . benztropine (COGENTIN) tablet 0.5 mg  0.5 mg Oral BID Malvin Johns, MD      . clopidogrel (PLAVIX) tablet 75 mg  75 mg Oral Daily Charm Rings, NP   75 mg at 12/24/18 0830  . divalproex (DEPAKOTE) DR tablet 500 mg  500 mg Oral Q12H Charm Rings, NP   500 mg at 12/23/18 2001  . haloperidol (HALDOL) tablet 5 mg  5 mg Oral Q6H PRN Money, Gerlene Burdock, FNP   5 mg at 12/23/18 2001  . haloperidol (HALDOL) tablet 5 mg  5 mg Oral BID Malvin Johns, MD      . influenza vaccine adjuvanted (FLUAD) injection 0.5 mL  0.5 mL Intramuscular Tomorrow-1000 Clapacs, John T, MD      . lithium carbonate (LITHOBID) CR tablet 300 mg  300 mg Oral Q12H Money, Gerlene Burdock, FNP   300 mg at 12/23/18 2001  . LORazepam (ATIVAN) tablet 2 mg  2 mg Oral Q6H PRN Clapacs, Jackquline Denmark, MD   2 mg at 12/23/18 2001  . magnesium hydroxide (MILK OF MAGNESIA) suspension 30 mL  30 mL Oral Daily PRN Charm Rings, NP      . [name] ON 12/25/2018] metoprolol succinate (TOPROL-XL) 24 hr tablet 100 mg  100 mg Oral Daily Malvin Johns, MD      . multivitamin with minerals tablet 1 tablet  1 tablet Oral Daily Charm Rings, NP   1 tablet at 12/24/18 0830  . QUEtiapine (SEROQUEL) tablet 500 mg  500 mg Oral QHS Clapacs, Jackquline Denmark, MD   500 mg at 12/23/18 2126  . rosuvastatin (CRESTOR) tablet  10 mg  10 mg Oral Daily Charm Rings, NP   10 mg at 12/23/18 1818   PTA Medications: Medications Prior to Admission  Medication Sig Dispense Refill Last Dose  . B Complex-Biotin-FA (B COMPLETE PO) Take 1 tablet by mouth daily.     . clopidogrel (PLAVIX) 75 MG tablet Take 1 tablet (75 mg total) by mouth daily. 30 tablet 3   . divalproex (DEPAKOTE) 250 MG DR tablet Take 250 mg by mouth 2 (two) times daily.     Marland Kitchen lamoTRIgine (LAMICTAL) 200 MG tablet Take 1 tablet (200 mg total) by mouth daily. 30 tablet 1   . metoprolol succinate (TOPROL-XL) 25 MG 24 hr tablet Take 1 tablet (25 mg total) by mouth daily. 30 tablet 1   . Multiple Vitamin (MULTIVITAMIN WITH MINERALS) TABS tablet Take 1 tablet by mouth daily.     . QUEtiapine (SEROQUEL XR) 200 MG 24 hr tablet Take 200 mg by mouth at bedtime.     . rosuvastatin (CRESTOR) 10 MG tablet Take 1 tablet (10 mg total) by mouth daily. 30 tablet 1     Patient Stressors: Financial difficulties Health problems Medication change or noncompliance Occupational concerns  Patient Strengths: Capable  of independent living Motivation for treatment/growth Special hobby/interest  Treatment Modalities: Medication Management, Group therapy, Case management,  1 to 1 session with clinician, Psychoeducation, Recreational therapy.   Physician Treatment Plan for Primary Diagnosis: Affective psychosis, bipolar (HCC) Long Term Goal(s): Improvement in symptoms so as ready for discharge Improvement in symptoms so as ready for discharge   Short Term Goals: Ability to verbalize feelings will improve Ability to demonstrate self-control will improve Ability to maintain clinical measurements within normal limits will improve Compliance with prescribed medications will improve  Medication Management: Evaluate patient's response, side effects, and tolerance of medication regimen.  Therapeutic Interventions: 1 to 1 sessions, Unit Group sessions and Medication  administration.  Evaluation of Outcomes: Not Progressing  Physician Treatment Plan for Secondary Diagnosis: Principal Problem:   Affective psychosis, bipolar (HCC) Active Problems:   HTN (hypertension)  Long Term Goal(s): Improvement in symptoms so as ready for discharge Improvement in symptoms so as ready for discharge   Short Term Goals: Ability to verbalize feelings will improve Ability to demonstrate self-control will improve Ability to maintain clinical measurements within normal limits will improve Compliance with prescribed medications will improve     Medication Management: Evaluate patient's response, side effects, and tolerance of medication regimen.  Therapeutic Interventions: 1 to 1 sessions, Unit Group sessions and Medication administration.  Evaluation of Outcomes: Not Progressing   RN Treatment Plan for Primary Diagnosis: Affective psychosis, bipolar (HCC) Long Term Goal(s): Knowledge of disease and therapeutic regimen to maintain health will improve  Short Term Goals: Ability to remain free from injury will improve, Ability to demonstrate self-control, Ability to participate in decision making will improve and Compliance with prescribed medications will improve  Medication Management: RN will administer medications as ordered by provider, will assess and evaluate patient's response and provide education to patient for prescribed medication. RN will report any adverse and/or side effects to prescribing provider.  Therapeutic Interventions: 1 on 1 counseling sessions, Psychoeducation, Medication administration, Evaluate responses to treatment, Monitor vital signs and CBGs as ordered, Perform/monitor CIWA, COWS, AIMS and Fall Risk screenings as ordered, Perform wound care treatments as ordered.  Evaluation of Outcomes: Not Progressing   LCSW Treatment Plan for Primary Diagnosis: Affective psychosis, bipolar (HCC) Long Term Goal(s): Safe transition to appropriate next  level of care at discharge, Engage patient in therapeutic group addressing interpersonal concerns.  Short Term Goals: Engage patient in aftercare planning with referrals and resources, Facilitate acceptance of mental health diagnosis and concerns, Identify triggers associated with mental health/substance abuse issues and Increase skills for wellness and recovery  Therapeutic Interventions: Assess for all discharge needs, 1 to 1 time with Social worker, Explore available resources and support systems, Assess for adequacy in community support network, Educate family and significant other(s) on suicide prevention, Complete Psychosocial Assessment, Interpersonal group therapy.  Evaluation of Outcomes: Not Progressing   Progress in Treatment: Attending groups: No. Participating in groups: No. Taking medication as prescribed: Yes. Toleration medication: Yes. Family/Significant other contact made: No, will contact:  when consent given Patient understands diagnosis: No. Discussing patient identified problems/goals with staff: Yes. Medical problems stabilized or resolved: Yes. Denies suicidal/homicidal ideation: Yes. Issues/concerns per patient self-inventory: No. Other: N/A  New problem(s) identified: No, Describe:  none  New Short Term/Long Term Goal(s): Detox, elimination of AVH/symptoms of psychosis, medication management for mood stabilization; elimination of SI thoughts; development of comprehensive mental wellness/sobriety plan.   Patient Goals:  "Put Trump in jail"  Discharge Plan or Barriers: SPE pamphlet, Mobile Crisis  information, and AA/NA information provided to patient for additional community support and resources. CSW assessing for appropriate referrals.  Reason for Continuation of Hospitalization: Delusions  Mania Medication stabilization  Estimated Length of Stay: 5-7 days  Recreational Therapy: Patient Stressors: N/A  Patient Goal: Patient will focus on task/topic  with 2 prompts from staff within 5 recreation therapy group  Sessions Attendees: Patient: 12/24/2018 9:14 AM  Physician: Dr Jake Samples, MD 12/24/2018 9:14 AM  Nursing:  12/24/2018 9:14 AM  [place], RN 12/24/2018 9:14 AM  Social Worker: Lurline Idol, LCSW 12/24/2018 9:14 AM  Recreational Therapist:  12/24/2018 9:14 AM  Other:  12/24/2018 9:14 AM  Other:  12/24/2018 9:14 AM  Other: 12/24/2018 9:14 AM        Scribe for Treatment Team: Joanne Chars, [place] 12/24/2018 9:24 AM

## 2018-12-24 NOTE — Plan of Care (Signed)
Patient denies SI/HI/AVH. Patient is extremely grandiose. Patient is resistant to care, argumentative with staff and frequently at the nurses station with bizarre request. Patient has delusions of a press conference that he must prepare for. Patient is demanding of staff.    Problem: Education: Goal: Knowledge of [place] [facility] Education information/materials will improve Outcome: Not Progressing Goal: Emotional status will improve Outcome: Not Progressing Goal: Mental status will improve Outcome: Not Progressing Goal: Verbalization of understanding the information provided will improve Outcome: Not Progressing

## 2018-12-25 ENCOUNTER — Inpatient Hospital Stay: Payer: Medicare Other

## 2018-12-25 ENCOUNTER — Inpatient Hospital Stay
Admission: EM | Admit: 2018-12-25 | Discharge: 2018-12-25 | DRG: 605 | Disposition: A | Discharge status: Other Acute Inpatient Hospital | Payer: Medicare Other | Attending: Family Medicine | Admitting: Family Medicine

## 2018-12-25 ENCOUNTER — Other Ambulatory Visit: Payer: Self-pay

## 2018-12-25 ENCOUNTER — Inpatient Hospital Stay (HOSPITAL_COMMUNITY)
Admission: RE | Admit: 2018-12-25 | Discharge: 2019-01-13 | Disposition: A | Discharge status: Home / Self Care | Payer: Medicare Other | Source: Intra-hospital | Attending: Psychiatry | Admitting: Psychiatry

## 2018-12-25 DIAGNOSIS — F319 Bipolar disorder, unspecified: Secondary | ICD-10-CM | POA: Diagnosis present

## 2018-12-25 DIAGNOSIS — Y939 Activity, unspecified: Secondary | ICD-10-CM

## 2018-12-25 DIAGNOSIS — F312 Bipolar disorder, current episode manic severe with psychotic features: Secondary | ICD-10-CM | POA: Diagnosis present

## 2018-12-25 DIAGNOSIS — Y92238 Other place in hospital as the place of occurrence of the external cause: Secondary | ICD-10-CM | POA: Diagnosis present

## 2018-12-25 DIAGNOSIS — I2510 Atherosclerotic heart disease of native coronary artery without angina pectoris: Secondary | ICD-10-CM | POA: Diagnosis present

## 2018-12-25 DIAGNOSIS — Z20828 Contact with and (suspected) exposure to other viral communicable diseases: Secondary | ICD-10-CM | POA: Diagnosis present

## 2018-12-25 DIAGNOSIS — Z7902 Long term (current) use of antithrombotics/antiplatelets: Secondary | ICD-10-CM

## 2018-12-25 DIAGNOSIS — S0990XA Unspecified injury of head, initial encounter: Secondary | ICD-10-CM

## 2018-12-25 DIAGNOSIS — F200 Paranoid schizophrenia: Secondary | ICD-10-CM | POA: Diagnosis present

## 2018-12-25 DIAGNOSIS — I2584 Coronary atherosclerosis due to calcified coronary lesion: Secondary | ICD-10-CM

## 2018-12-25 DIAGNOSIS — W109XXA Fall (on) (from) unspecified stairs and steps, initial encounter: Secondary | ICD-10-CM | POA: Diagnosis present

## 2018-12-25 DIAGNOSIS — Z0184 Encounter for antibody response examination: Secondary | ICD-10-CM

## 2018-12-25 DIAGNOSIS — Z79899 Other long term (current) drug therapy: Secondary | ICD-10-CM

## 2018-12-25 DIAGNOSIS — G9340 Encephalopathy, unspecified: Secondary | ICD-10-CM

## 2018-12-25 DIAGNOSIS — F4310 Post-traumatic stress disorder, unspecified: Secondary | ICD-10-CM | POA: Diagnosis present

## 2018-12-25 DIAGNOSIS — S0190XA Unspecified open wound of unspecified part of head, initial encounter: Principal | ICD-10-CM | POA: Diagnosis present

## 2018-12-25 DIAGNOSIS — Z886 Allergy status to analgesic agent status: Secondary | ICD-10-CM

## 2018-12-25 DIAGNOSIS — K219 Gastro-esophageal reflux disease without esophagitis: Secondary | ICD-10-CM | POA: Diagnosis present

## 2018-12-25 DIAGNOSIS — I10 Essential (primary) hypertension: Secondary | ICD-10-CM | POA: Diagnosis present

## 2018-12-25 DIAGNOSIS — N400 Enlarged prostate without lower urinary tract symptoms: Secondary | ICD-10-CM | POA: Diagnosis present

## 2018-12-25 DIAGNOSIS — Z8673 Personal history of transient ischemic attack (TIA), and cerebral infarction without residual deficits: Secondary | ICD-10-CM

## 2018-12-25 DIAGNOSIS — Z7901 Long term (current) use of anticoagulants: Secondary | ICD-10-CM

## 2018-12-25 LAB — BLOOD GAS, ARTERIAL
Acid-Base Excess: 3.1 mmol/L — ABNORMAL HIGH (ref 0.0–2.0)
Allens test (pass/fail): POSITIVE — AB
Bicarbonate: 27 mmol/L (ref 20.0–28.0)
FIO2: 0.21
O2 Saturation: 96 %
Patient temperature: 37
pCO2 arterial: 38 mmHg (ref 32.0–48.0)
pH, Arterial: 7.46 — ABNORMAL HIGH (ref 7.350–7.450)
pO2, Arterial: 77 mmHg — ABNORMAL LOW (ref 83.0–108.0)

## 2018-12-25 LAB — GLUCOSE, CAPILLARY
Glucose-Capillary: 99 mg/dL (ref 70–99)
Glucose-Capillary: 80 mg/dL (ref 70–99)

## 2018-12-25 LAB — TROPONIN I (HIGH SENSITIVITY)
Troponin I (High Sensitivity): 5 ng/L (ref <18)
Troponin I (High Sensitivity): 4 ng/L (ref <18)
Troponin I (High Sensitivity): 5 ng/L (ref <18)

## 2018-12-25 LAB — SAR COV2 SEROLOGY (COVID19)AB(IGG),IA: SARS-CoV-2 Ab, IgG: NONREACTIVE

## 2018-12-25 LAB — VITAMIN B12: Vitamin B-12: 320 pg/mL (ref 180–914)

## 2018-12-25 LAB — HEMOGLOBIN A1C
Hgb A1c MFr Bld: 5.8 % — ABNORMAL HIGH (ref 4.8–5.6)
Mean Plasma Glucose: 119.76 mg/dL

## 2018-12-25 LAB — SEDIMENTATION RATE: Sed Rate: 3 mm/hr (ref 0–20)

## 2018-12-25 LAB — FIBRIN DERIVATIVES D-DIMER (ARMC ONLY): Fibrin derivatives D-dimer (ARMC): 710.31 ng/mL (FEU) — ABNORMAL HIGH (ref 0.00–499.00)

## 2018-12-25 LAB — AMMONIA: Ammonia: 16 umol/L (ref 9–35)

## 2018-12-25 LAB — MRSA PCR SCREENING: MRSA by PCR: NEGATIVE

## 2018-12-25 LAB — TSH: TSH: 2.664 u[IU]/mL (ref 0.350–4.500)

## 2018-12-25 MED ORDER — MAGNESIUM HYDROXIDE 400 MG/5ML PO SUSP: 30.0000 mL | Freq: Every day | ORAL | Status: DC | PRN

## 2018-12-25 MED ORDER — ACETAMINOPHEN 325 MG PO TABS: 650.0000 mg | ORAL_TABLET | Freq: Four times a day (QID) | ORAL | Status: DC | PRN

## 2018-12-25 MED ORDER — ACETAMINOPHEN 325 MG PO TABS: 650.0000 mg | ORAL_TABLET | Freq: Four times a day (QID) | ORAL | 0 refills | Status: DC | PRN

## 2018-12-25 MED ORDER — DIVALPROEX SODIUM ER 250 MG PO TB24
250.0000 mg | ORAL_TABLET | Freq: Two times a day (BID) | ORAL | Status: DC
  Filled 2018-12-25 (×2): qty 1

## 2018-12-25 MED ORDER — BENZTROPINE MESYLATE 1 MG PO TABS: 1.0000 mg | ORAL_TABLET | Freq: Two times a day (BID) | ORAL | Status: DC

## 2018-12-25 MED ORDER — ALUM & MAG HYDROXIDE-SIMETH 200-200-20 MG/5ML PO SUSP: 30.0000 mL | ORAL | 0 refills | Status: DC | PRN

## 2018-12-25 MED ORDER — CLOPIDOGREL BISULFATE 75 MG PO TABS
75.0000 mg | ORAL_TABLET | Freq: Every day | ORAL | Status: DC
  Administered 2018-12-26 – 2019-01-13 (×19): 75 mg via ORAL
  Filled 2018-12-25 (×19): qty 1

## 2018-12-25 MED ORDER — PERPHENAZINE 4 MG PO TABS: 4.0000 mg | ORAL_TABLET | Freq: Three times a day (TID) | ORAL | Status: DC

## 2018-12-25 MED ORDER — METOPROLOL SUCCINATE ER 50 MG PO TB24
50.0000 mg | ORAL_TABLET | Freq: Every day | ORAL | Status: DC
  Administered 2018-12-25: 50 mg via ORAL
  Filled 2018-12-25: qty 1

## 2018-12-25 MED ORDER — BENZTROPINE MESYLATE 1 MG PO TABS
1.0000 mg | ORAL_TABLET | Freq: Two times a day (BID) | ORAL | Status: DC
  Filled 2018-12-25 (×2): qty 1

## 2018-12-25 MED ORDER — PERPHENAZINE 4 MG PO TABS
4.0000 mg | ORAL_TABLET | Freq: Three times a day (TID) | ORAL | Status: DC
  Administered 2018-12-25 – 2018-12-26 (×4): 4 mg via ORAL
  Filled 2018-12-25 (×4): qty 1

## 2018-12-25 MED ORDER — ALUM & MAG HYDROXIDE-SIMETH 200-200-20 MG/5ML PO SUSP: 30.0000 mL | ORAL | Status: DC | PRN

## 2018-12-25 MED ORDER — HYDROXYZINE HCL 50 MG PO TABS
50.0000 mg | ORAL_TABLET | Freq: Three times a day (TID) | ORAL | Status: DC | PRN
  Administered 2018-12-26 – 2019-01-10 (×5): 50 mg via ORAL
  Filled 2018-12-25 (×6): qty 1

## 2018-12-25 MED ORDER — MAGNESIUM HYDROXIDE 400 MG/5ML PO SUSP: 30.0000 mL | Freq: Every day | ORAL | 0 refills | Status: DC | PRN

## 2018-12-25 MED ORDER — THIAMINE HCL 100 MG/ML IJ SOLN: 100.0000 mg | Freq: Every day | INTRAMUSCULAR | Status: DC

## 2018-12-25 MED ORDER — PERPHENAZINE 4 MG PO TABS
4.0000 mg | ORAL_TABLET | Freq: Three times a day (TID) | ORAL | Status: DC
  Filled 2018-12-25 (×3): qty 1

## 2018-12-25 MED ORDER — THIAMINE HCL 100 MG/ML IJ SOLN
100.0000 mg | Freq: Every day | INTRAMUSCULAR | Status: DC
  Administered 2018-12-25: 100 mg via INTRAVENOUS
  Filled 2018-12-25: qty 2

## 2018-12-25 MED ORDER — BENZTROPINE MESYLATE 1 MG PO TABS: 0.5000 mg | ORAL_TABLET | Freq: Two times a day (BID) | ORAL | Status: DC

## 2018-12-25 MED ORDER — CLOPIDOGREL BISULFATE 75 MG PO TABS
75.0000 mg | ORAL_TABLET | Freq: Every day | ORAL | Status: DC
  Administered 2018-12-25: 75 mg via ORAL
  Filled 2018-12-25: qty 1

## 2018-12-25 MED ORDER — BENZTROPINE MESYLATE 1 MG PO TABS
0.5000 mg | ORAL_TABLET | Freq: Two times a day (BID) | ORAL | Status: DC
  Administered 2018-12-25 – 2018-12-29 (×8): 0.5 mg via ORAL
  Filled 2018-12-25 (×7): qty 1

## 2018-12-25 MED ORDER — LACTATED RINGERS IV SOLN: INTRAVENOUS | Status: DC

## 2018-12-25 MED ORDER — ACETAMINOPHEN 325 MG PO TABS
650.0000 mg | ORAL_TABLET | Freq: Four times a day (QID) | ORAL | Status: DC | PRN
  Administered 2018-12-27 – 2019-01-12 (×23): 650 mg via ORAL
  Filled 2018-12-25 (×24): qty 2

## 2018-12-25 MED ORDER — METOPROLOL SUCCINATE ER 25 MG PO TB24
50.0000 mg | ORAL_TABLET | Freq: Every day | ORAL | Status: DC
  Administered 2018-12-26 – 2019-01-13 (×19): 50 mg via ORAL
  Filled 2018-12-25 (×19): qty 2

## 2018-12-25 NOTE — Tx Team (Signed)
Initial Treatment Plan 12/25/2018 4:14 PM Austin Rhodes ERX:[id number]    PATIENT STRESSORS: Health problems Medication change or noncompliance   PATIENT STRENGTHS: Communication skills General fund of knowledge Supportive family/friends   PATIENT IDENTIFIED PROBLEMS: Delusional   Manic behavior                   DISCHARGE CRITERIA:  Ability to meet basic life and health needs Improved stabilization in mood, thinking, and/or behavior Need for constant or close observation no longer present Reduction of life-threatening or endangering symptoms to within safe limits  PRELIMINARY DISCHARGE PLAN: Outpatient therapy Return to previous living arrangement  PATIENT/FAMILY INVOLVEMENT: This treatment plan has been presented to and reviewed with the patient, Austin Rhodes. The patient has been given the opportunity to ask questions and make suggestions.  Carynn Felling, RN 12/25/2018, 4:14 PM

## 2018-12-25 NOTE — ED Notes (Signed)
Patient transported to X-ray 

## 2018-12-25 NOTE — Progress Notes (Signed)
D: Patient found walking up the hall at [phone] saying he had fallen down some stairs. Last well time was 0645 when patient was seen in bed in his room in no acute distress. Had urinated on himself and was holding on to the wall. Said the back of his head was hurt, and there was blood on the back of patient's head. Assisted into wheelchair, vital signs taken. Dr. Jake Samples called and ordered CT scan without contrast. On the way to CT, patient became more lethargic, leaning to the left side, speech slurred.  A: Called rapid response and notified AC R: Patient discharged from BMU and transferred to the ED.

## 2018-12-25 NOTE — Progress Notes (Signed)
Patient stated to this writer, "I need you to get in touch with Austin Rhodes, you know how to do that right, [employer], and tell him to get up here by 7pm".

## 2018-12-25 NOTE — Progress Notes (Signed)
Patient has been sleeping since he had lunch, shortly after arriving back onto the unit.

## 2018-12-25 NOTE — Progress Notes (Signed)
Admission Note:   Report was received from [place], [place] on a 68 year old male, returning from the ICU, who initailly presented IVC in no acute distress for the treatment of Manic behavior and is also delusional. Patient was calm and cooperative with admission process, however, he just wanted to eat and lay down and rest. Patient denied SI/HI/AVH and pain to this Probation officer. Patient also denied any signs/symptoms of depression/anxiety. Patient has a past medical history of CAD, GERD, Bipolar, and Anxiety. Skin was assessed with Kayla, MHT, and found to be clear of any abnormal marks apart from an abrasion to the back of his head from a previous fall. Patient searched and no contraband found and unit policies explained and understanding verbalized. Consents obtained. Food and fluids offered, and fluids accepted. Patient had no additional questions or concerns at this time.

## 2018-12-25 NOTE — Progress Notes (Signed)
Arrived to CT scan patient lethargic/ responds to name and follows simple commands. Grips are present but weak, VSS. Patient transported by stretcher to ED 15.

## 2018-12-25 NOTE — Discharge Summary (Signed)
Physician Discharge Summary  Austin CroonMaurice D Rhodes GEX:[id number]:[id number] DOB: 25-Jun-1950 DOA: 12/25/2018  PCP: Rinaldo CloudHarwani, Mohan, MD  Admit date: 12/25/2018 Discharge date: 12/25/2018  Admitted From: BHU Disposition:  BHU  Recommendations for Outpatient Follow-up:  1. Follow up with PCP in 1-2 weeks 2. Please obtain BMP/CBC in one week 3. Please follow up on the following pending results:  Home Health:no Equipment/Devices:no Discharge Condition:stable CODE STATUS:full Diet recommendation: Heart Healthy / Carb Modified / Regular / Dysphagia   Brief/Interim Summary: You may copy/paste interim summary or write brief hospital course depending on length of stay  Discharge Diagnoses:  Active Problems:   HTN (hypertension)   CAD (coronary artery disease)   Head injury   Schizophrenia, paranoid (HCC)    Discharge Instructions   Allergies as of 12/25/2018      Reactions   Procaine Other (See Comments)      Medication List    TAKE these medications   acetaminophen 325 MG tablet Commonly known as: TYLENOL Take 2 tablets (650 mg total) by mouth every 6 (six) hours as needed for mild pain or moderate pain.   alum & mag hydroxide-simeth 200-200-20 MG/5ML suspension Commonly known as: MAALOX/MYLANTA Take 30 mLs by mouth every 4 (four) hours as needed for indigestion.   B COMPLETE PO Take 1 tablet by mouth daily.   benztropine 1 MG tablet Commonly known as: COGENTIN Take 1 tablet (1 mg total) by mouth 2 (two) times daily.   clopidogrel 75 MG tablet Commonly known as: Plavix Take 1 tablet (75 mg total) by mouth daily.   divalproex 250 MG DR tablet Commonly known as: DEPAKOTE Take 250 mg by mouth 2 (two) times daily.   lamoTRIgine 200 MG tablet Commonly known as: LAMICTAL Take 1 tablet (200 mg total) by mouth daily.   magnesium hydroxide 400 MG/5ML suspension Commonly known as: MILK OF MAGNESIA Take 30 mLs by mouth daily as needed for mild constipation.   metoprolol  succinate 25 MG 24 hr tablet Commonly known as: TOPROL-XL Take 1 tablet (25 mg total) by mouth daily.   multivitamin with minerals Tabs tablet Take 1 tablet by mouth daily.   perphenazine 4 MG tablet Commonly known as: TRILAFON Take 1 tablet (4 mg total) by mouth 3 (three) times daily.   QUEtiapine 200 MG 24 hr tablet Commonly known as: SEROQUEL XR Take 200 mg by mouth at bedtime.   rosuvastatin 10 MG tablet Commonly known as: CRESTOR Take 1 tablet (10 mg total) by mouth daily.   thiamine 100 MG/ML injection Commonly known as: B-1 Inject 1 mL (100 mg total) into the vein daily.       Allergies  Allergen Reactions  . Procaine Other (See Comments)    Consultations:  Dr.Farrah- psychiatry   Procedures/Studies: Ct Head Wo Contrast  Result Date: 12/25/2018 CLINICAL DATA:  Minor head trauma.  Fall this morning.  Unresponsive EXAM: CT HEAD WITHOUT CONTRAST TECHNIQUE: Contiguous axial images were obtained from the base of the skull through the vertex without intravenous contrast. COMPARISON:  03/31/2017 FINDINGS: Brain: No evidence of acute infarction, hemorrhage, hydrocephalus, extra-axial collection or mass lesion/mass effect. Chronic small vessel ischemia with low-density throughout much of the cerebral white matter. Mild cerebral volume loss. Vascular: Atherosclerotic calcification with vertebrobasilar tortuosity. Skull: Negative for fracture or bone lesion. Sinuses/Orbits: Negative IMPRESSION: 1. No acute finding. 2. Chronic small vessel ischemia. Electronically Signed   By: Marnee SpringJonathon  Watts M.D.   On: 12/25/2018 06:44   Dg Chest Portable 1 View  Result  Date: 12/16/2018 CLINICAL DATA:  Fever EXAM: PORTABLE CHEST 1 VIEW COMPARISON:  05/19/2015 FINDINGS: Chronic linear scarring in the lingula. The lungs appear otherwise clear. Cardiac and mediastinal margins appear normal. No pleural effusion is identified. IMPRESSION: 1. Chronic linear scarring in the lingula. Otherwise, no  significant abnormalities are radiographically apparent. No pneumonia or pleural effusion identified. Electronically Signed   By: Van Clines M.D.   On: 12/16/2018 20:06     Subjective: Pt is alert awake and oriented but  Delusional.   Discharge Exam: Vitals:   12/25/18 0700 12/25/18 0749  BP:  (!) 134/93  Pulse: 89 81  Resp: (!) 9 14  Temp:  (!) 96.7 F (35.9 C)  SpO2: 97% 100%   Vitals:   12/25/18 0645 12/25/18 0650 12/25/18 0700 12/25/18 0749  BP: (!) 149/82 131/86  (!) 134/93  Pulse:  89 89 81  Resp: 16 (!) 23 (!) 9 14  Temp:  (!) 97.2 F (36.2 C)  (!) 96.7 F (35.9 C)  TempSrc:  Axillary  Axillary  SpO2: 98% 99% 97% 100%  Weight:  80.2 kg  76.8 kg  Height:    5\' 10"  (1.778 m)    General: Pt is alert, awake, not in acute distress Cardiovascular: RRR, S1/S2 +, no rubs, no gallops Respiratory: CTA bilaterally, no wheezing, no rhonchi Abdominal: Soft, NT, ND, bowel sounds + Extremities: no edema, no cyanosis    The results of significant diagnostics from this hospitalization (including imaging, microbiology, ancillary and laboratory) are listed below for reference.     Microbiology: Recent Results (from the past 240 hour(s))  SARS CORONAVIRUS 2 (TAT 6-24 HRS) Nasopharyngeal Nasopharyngeal Swab     Status: None   Collection Time: 12/17/18  3:52 PM   Specimen: Nasopharyngeal Swab  Result Value Ref Range Status   SARS Coronavirus 2 NEGATIVE NEGATIVE Final    Comment: (NOTE) SARS-CoV-2 target nucleic acids are NOT DETECTED. The SARS-CoV-2 RNA is generally detectable in upper and lower respiratory specimens during the acute phase of infection. Negative results do not preclude SARS-CoV-2 infection, do not rule out co-infections with other pathogens, and should not be used as the sole basis for treatment or other patient management decisions. Negative results must be combined with clinical observations, patient history, and epidemiological information. The  expected result is Negative. Fact Sheet for Patients: SugarRoll.be Fact Sheet for Healthcare Providers: https://www.woods-mathews.com/ This test is not yet approved or cleared by the [place] FDA and  has been authorized for detection and/or diagnosis of SARS-CoV-2 by FDA under an Emergency Use Authorization (EUA). This EUA will remain  in effect (meaning this test can be used) for the duration of the COVID-19 declaration under Section 56 4(b)(1) of the Act, 21 U.S.C. section 360bbb-3(b)(1), unless the authorization is terminated or revoked sooner. Performed at [facility], [place] [address], [place], [place] [address]      Labs: BNP (last 3 results) No results for input(s): BNP in the last 8760 hours. Basic Metabolic Panel: Recent Labs  Lab 12/22/18 1504  NA 136  K 4.0  CL 97*  CO2 26  GLUCOSE 124*  BUN 16  CREATININE 1.08  CALCIUM 9.4   Liver Function Tests: Recent Labs  Lab 12/22/18 1504  AST 24  ALT 26  ALKPHOS 56  BILITOT 0.7  PROT 7.8  ALBUMIN 4.4   No results for input(s): LIPASE, AMYLASE in the last 168 hours. Recent Labs  Lab 12/25/18 0804  AMMONIA 16   CBC:  No results for input(s): WBC, NEUTROABS, HGB, HCT, MCV, PLT in the last 168 hours. Cardiac Enzymes: No results for input(s): CKTOTAL, CKMB, CKMBINDEX, TROPONINI in the last 168 hours. BNP: Invalid input(s): POCBNP CBG: Recent Labs  Lab 12/20/18 0857 12/25/18 0657 12/25/18 0747  GLUCAP 174* 99 80   D-Dimer No results for input(s): DDIMER in the last 72 hours. Hgb A1c No results for input(s): HGBA1C in the last 72 hours. Lipid Profile No results for input(s): CHOL, HDL, LDLCALC, TRIG, CHOLHDL, LDLDIRECT in the last 72 hours. Thyroid function studies Recent Labs    12/25/18 0708  TSH 2.664   Anemia work up No results for input(s): VITAMINB12, FOLATE, FERRITIN, TIBC, IRON, RETICCTPCT in the last 72 hours. Urinalysis    Component  Value Date/Time   COLORURINE YELLOW (A) 08/29/2016 1353   APPEARANCEUR HAZY (A) 08/29/2016 1353   LABSPEC 1.006 08/29/2016 1353   PHURINE 7.0 08/29/2016 1353   GLUCOSEU 50 (A) 08/29/2016 1353   HGBUR NEGATIVE 08/29/2016 1353   BILIRUBINUR NEGATIVE 08/29/2016 1353   KETONESUR 5 (A) 08/29/2016 1353   PROTEINUR NEGATIVE 08/29/2016 1353   NITRITE NEGATIVE 08/29/2016 1353   LEUKOCYTESUR NEGATIVE 08/29/2016 1353   Sepsis Labs Invalid input(s): PROCALCITONIN,  WBC,  LACTICIDVEN Microbiology Recent Results (from the past 240 hour(s))  SARS CORONAVIRUS 2 (TAT 6-24 HRS) Nasopharyngeal Nasopharyngeal Swab     Status: None   Collection Time: 12/17/18  3:52 PM   Specimen: Nasopharyngeal Swab  Result Value Ref Range Status   SARS Coronavirus 2 NEGATIVE NEGATIVE Final    Comment: (NOTE) SARS-CoV-2 target nucleic acids are NOT DETECTED. The SARS-CoV-2 RNA is generally detectable in upper and lower respiratory specimens during the acute phase of infection. Negative results do not preclude SARS-CoV-2 infection, do not rule out co-infections with other pathogens, and should not be used as the sole basis for treatment or other patient management decisions. Negative results must be combined with clinical observations, patient history, and epidemiological information. The expected result is Negative. Fact Sheet for Patients: HairSlick.no Fact Sheet for Healthcare Providers: quierodirigir.com This test is not yet approved or cleared by the [place] FDA and  has been authorized for detection and/or diagnosis of SARS-CoV-2 by FDA under an Emergency Use Authorization (EUA). This EUA will remain  in effect (meaning this test can be used) for the duration of the COVID-19 declaration under Section 56 4(b)(1) of the Act, 21 U.S.C. section 360bbb-3(b)(1), unless the authorization is terminated or revoked sooner. Performed at [facility], [address], [place], [place] [address]      Time coordinating discharge: less than 30  minutes  SIGNED:   Gertha Calkin, MD  [facility] 12/25/2018, 11:49 AM Pager   If 7PM-7AM, please contact night-coverage [email or web address] Password [email or web address]

## 2018-12-25 NOTE — Plan of Care (Signed)
New admission.  Problem: Education: Goal: Knowledge of [facility] Education information/materials will improve Outcome: Not Progressing Goal: Emotional status will improve Outcome: Not Progressing Goal: Mental status will improve Outcome: Not Progressing Goal: Verbalization of understanding the information provided will improve Outcome: Not Progressing   Problem: Safety: Goal: Periods of time without injury will increase Outcome: Not Progressing   Problem: Education: Goal: Will be free of psychotic symptoms Outcome: Not Progressing Goal: Knowledge of the prescribed therapeutic regimen will improve Outcome: Not Progressing   Problem: Coping: Goal: Coping ability will improve Outcome: Not Progressing Goal: Will verbalize feelings Outcome: Not Progressing   Problem: Health Behavior/Discharge Planning: Goal: Compliance with prescribed medication regimen will improve Outcome: Not Progressing   

## 2018-12-25 NOTE — Progress Notes (Signed)
Pt admitted from ED this am-> Pt has remained alert and oriented to person, place, and time with no c/o pain. CTh negative. Pt has remained on RA, SpO2 > 95%, lung sounds clear to auscultation, NDN.  Pt has remained in NSR/ST in low 100s, BP slightly hypertensive. Pt has requested water, food, and appropriate use of a urinal. Pt has equal motor responses/strength bilaterally, PERRLA.  Pt will be d/c to [facility].

## 2018-12-25 NOTE — Progress Notes (Signed)
Patient found walking down the hallway with his walker during safety rounds reporting to nurse he had fell down a flight of steps approximately around 0608 after doing a further assessment patient had seemed to fall in his room right before coming to getting morning vitals done. He was found with blood on the back of his head and shirt. MD was called, vitals were takened and order was given for a CT scan of his head, charge nurse escorted him down to CT at this time.

## 2018-12-25 NOTE — ED Triage Notes (Signed)
Pt to ED via stretcher from CT.  Pt was escorted by behavioral nurse where patient was admitted prior.  Per Pamala Hurry, RN patient was last seen normal approx 0545, during rounds patient seen stumbling down hallway at 0610 noted to be unsteady and not at baseline, patient states fell down stairs, noted to have wet blood to posterior head, RN given order to go to CT.  Pt seem to decline and rapid was called.  Patient then wheeled to room 15 in the ED where met by this RN, charge RN, [facility] and Risk manager.  Pt then placed on monitoring cords, vital signs obtained, CBG checked of 99, IV placed and labs obtained and sent with save labels.  Dr. Sidney Ace to bedside at 0700 and assessed.  No new verbal orders at this time to this RN.

## 2018-12-25 NOTE — H&P (Signed)
History and Physical    Austin Rhodes Austin Rhodes DOB: 02-Apr-1950 DOA: 12/25/2018  PCP: Rinaldo Cloud, MD (Confirm with patient/family/NH records and if not entered, this has to be entered at [facility] point of entry) Patient coming from: BHU  I have personally briefly reviewed patient's old medical records in [facility]  Chief Complaint: Acute encephalopathy.  HPI: Austin Rhodes is a 68 y.o. male with medical history significant of Bipolar disorder/ cad/ gerd seen in ed for ams that started today morning when Austin Rhodes was noted to ed unsteady and fell down the stairs , Austin Rhodes hurt the back of his head and was bleeding  But wound did not require stitches.  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  We will admit Austin Rhodes for syncope workup. Then return to [facility]   ED Course: Austin Rhodes was stabilized in ED and he was somnolent . Blood pressure (!) 134/93, pulse 81, temperature (!) 96.7 F (35.9 C), temperature source Axillary, resp. rate 14, height 5\' 10"  (1.778 m), weight 76.8 kg, SpO2 100 %. Austin Rhodes had head ct which was negative for acute bleed.he was transferred to unit due to lack of bed.  Neuro labs ordered and are pending .  Review of Systems: As per HPI otherwise 10 point review of systems negative.    Past Medical History:  Diagnosis Date  . Bipolar disorder (HCC)   . Coronary artery disease   . GERD (gastroesophageal reflux disease)   . History of multiple strokes   . PTSD (post-traumatic stress disorder)     Past Surgical History:  Procedure Laterality Date  . CARDIAC CATHETERIZATION       reports that he has never smoked. He has never used smokeless tobacco. He reports that he does not drink alcohol or use drugs.  Allergies  Allergen Reactions  . Procaine Other (See Comments)     No family history on file.   Prior to Admission medications   Medication Sig Start Date End Date Taking? Authorizing Provider  B Complex-Biotin-FA (B COMPLETE PO) Take 1 tablet by mouth daily.    [provider]  clopidogrel (PLAVIX) 75 MG tablet Take 1 tablet (75 mg total) by mouth daily. 05/10/15   07/10/15, MD  divalproex (DEPAKOTE) 250 MG DR tablet Take 250 mg by mouth 2 (two) times daily. 12/14/18   [provider]  lamoTRIgine (LAMICTAL) 200 MG tablet Take 1 tablet (200 mg total) by mouth daily. 11/09/17   Pucilowska, 01/09/18 B, MD  metoprolol succinate (TOPROL-XL) 25 MG 24 hr tablet Take 1 tablet (25 mg total) by mouth daily. 11/09/17   Pucilowska, 01/09/18, MD  Multiple Vitamin (MULTIVITAMIN WITH MINERALS) TABS tablet Take 1 tablet by mouth daily.    [provider]  QUEtiapine (SEROQUEL XR) 200 MG 24 hr tablet Take 200 mg by mouth at bedtime. 12/14/18   [provider]  rosuvastatin (CRESTOR) 10 MG tablet Take 1 tablet (10 mg total) by mouth daily. 11/09/17   01/09/18, MD    Physical Exam: Vitals:   12/25/18 0645 12/25/18 0650 12/25/18 0700 12/25/18 0749  BP: (!) 149/82 131/86  (!) 134/93  Pulse:  89 89 81  Resp: 16 (!) 23 (!) 9 14  Temp:  (!) 97.2 F (36.2 C)  (!)  96.7 F (35.9 C)  TempSrc:  Axillary  Axillary  SpO2: 98% 99% 97% 100%  Weight:  80.2 kg  76.8 kg  Height:    5\' 10"  (1.778 m)    Constitutional: NAD, calm, comfortable Vitals:   12/25/18 0645 12/25/18 0650 12/25/18 0700 12/25/18 0749  BP: (!) 149/82 131/86  (!) 134/93  Pulse:  89 89 81  Resp: 16 (!) 23 (!) 9 14  Temp:  (!) 97.2 F (36.2 C)  (!) 96.7 F (35.9 C)  TempSrc:  Axillary  Axillary  SpO2: 98% 99% 97% 100%  Weight:  80.2 kg  76.8 kg  Height:    5\' 10"  (1.778 m)   Eyes: PERRL, lids and conjunctivae normal ENMT: Mucous membranes are moist. Posterior pharynx clear of any exudate or lesions.Normal dentition.  Neck: normal, supple, no masses, no  thyromegaly Respiratory: clear to auscultation bilaterally, no wheezing, no crackles. Normal respiratory effort. No accessory muscle use.  Cardiovascular: Regular rate and rhythm, no murmurs / rubs / gallops. No extremity edema. 2+ pedal pulses. No carotid bruits.  Abdomen: no tenderness, no masses palpated. No hepatosplenomegaly. Bowel sounds positive.  Musculoskeletal: no clubbing / cyanosis. No joint deformity upper and lower extremities. Good ROM, no contractures. Normal muscle tone.  Skin: no rashes, lesions, ulcers. No induration Neurologic: CN 2-12 grossly intact DTR normal. Strength 5/5 in all 4.  Psychiatric: Alert and oriented x 3. Normal affect but confused.   Labs on Admission: I have personally reviewed following labs and imaging studies  CBC: No results for input(s): WBC, NEUTROABS, HGB, HCT, MCV, PLT in the last 168 hours. Basic Metabolic Panel: Recent Labs  Lab 12/22/18 1504  NA 136  K 4.0  CL 97*  CO2 26  GLUCOSE 124*  BUN 16  CREATININE 1.08  CALCIUM 9.4   GFR: Estimated Creatinine Clearance: 69.5 mL/min (by C-G formula based on SCr of 1.08 mg/dL). Liver Function Tests: Recent Labs  Lab 12/22/18 1504  AST 24  ALT 26  ALKPHOS 56  BILITOT 0.7  PROT 7.8  ALBUMIN 4.4   No results for input(s): LIPASE, AMYLASE in the last 168 hours. Recent Labs  Lab 12/25/18 0804  AMMONIA 16   Coagulation Profile: No results for input(s): INR, PROTIME in the last 168 hours. Cardiac Enzymes: No results for input(s): CKTOTAL, CKMB, CKMBINDEX, TROPONINI in the last 168 hours. BNP (last 3 results) No results for input(s): PROBNP in the last 8760 hours. HbA1C: No results for input(s): HGBA1C in the last 72 hours. CBG: Recent Labs  Lab 12/20/18 0857 12/25/18 0657 12/25/18 0747  GLUCAP 174* 99 80   Lipid Profile: No results for input(s): CHOL, HDL, LDLCALC, TRIG, CHOLHDL, LDLDIRECT in the last 72 hours. Thyroid Function Tests: Recent Labs    12/25/18 0708  TSH  2.664   Anemia Panel: No results for input(s): VITAMINB12, FOLATE, FERRITIN, TIBC, IRON, RETICCTPCT in the last 72 hours. Urine analysis:    Component Value Date/Time   COLORURINE YELLOW (A) 08/29/2016 1353   APPEARANCEUR HAZY (A) 08/29/2016 1353   LABSPEC 1.006 08/29/2016 1353   PHURINE 7.0 08/29/2016 1353   GLUCOSEU 50 (A) 08/29/2016 1353   HGBUR NEGATIVE 08/29/2016 1353   BILIRUBINUR NEGATIVE 08/29/2016 1353   KETONESUR 5 (A) 08/29/2016 1353   PROTEINUR NEGATIVE 08/29/2016 1353   NITRITE NEGATIVE 08/29/2016 1353   LEUKOCYTESUR NEGATIVE 08/29/2016 1353    Radiological Exams on Admission: Ct Head Wo Contrast  Result Date: 12/25/2018 CLINICAL DATA:  Minor head trauma.  Fall this morning.  Unresponsive EXAM: CT HEAD WITHOUT CONTRAST TECHNIQUE: Contiguous axial images were obtained from the base of the skull through the vertex without intravenous contrast. COMPARISON:  03/31/2017 FINDINGS: Brain: No evidence of acute infarction, hemorrhage, hydrocephalus, extra-axial collection or mass lesion/mass effect. Chronic small vessel ischemia with low-density throughout much of the cerebral white matter. Mild cerebral volume loss. Vascular: Atherosclerotic calcification with vertebrobasilar tortuosity. Skull: Negative for fracture or bone lesion. Sinuses/Orbits: Negative IMPRESSION: 1. No acute finding. 2. Chronic small vessel ischemia. Electronically Signed   By: Monte Fantasia M.D.   On: 12/25/2018 06:44    EKG: Pending.  Assessment/Plan Active Problems:   HTN (hypertension)   CAD (coronary artery disease)   Head injury   Schizophrenia, paranoid ([place]) Wound care and dressing to cont.  Initial stat ct is negative.  We will do an echo and carotid doppler. 12 lead ekg.   Htn: Austin Rhodes contineud on metoprolol.  CAD: Austin Rhodes to continue on metoprolol and statin and plavix.   Schizophrenia: Austin Rhodes is alert and awake but confused and neurologically normal. We will cotn to obtain MRI of brain and  carotid dopplers .    DVT prophylaxis: Ambulatory\ Family Communication: None at bedside.\ Disposition Plan: BHU for 2 week.s  \Consults called: Psychiatry Dr. Sheppard Evens.  Admission status: observation\  Para Skeans MD [facility]  If 7PM-7AM, please contact night-coverage [email or web address] Password [email or web address]  12/25/2018, 11:40 AM

## 2018-12-25 NOTE — Progress Notes (Signed)
Patient stated to this writer that he wanted the doctor to be called and tell him that he wants to sleep in his bed tonight.

## 2018-12-25 NOTE — Progress Notes (Signed)
[facility] MD Progress Note  12/25/2018 10:37 AM Austin Rhodes  MRN:  [id number] Subjective:    Austin Rhodes is a 68 year old patient well-known to the psychiatry service who has, at baseline, persistent delusions that are somewhat treatment resistant -he did have a fall in the night, in between his 15-minute precautionary checks, and since there was an abrasion the back of his head he was sent for a CT scan which has come back negative for any sort of acute bleed further he was sent to the ICU for monitoring due to lethargy  At the present time though he seems to have recalibrated to his baseline status.  His blood pressure still up.  He is alert and oriented to person place situation time and he makes his usual bizarre and grandiose/delusional statements states "I was thrown down" that is what happens to a whistleblower"  But he is not in any acute medical danger according to ICU staff and we can transfer him back to psychiatry. The only change that I see might have contributed to his possible orthostasis and fall was an escalation in his Toprol however his blood pressure remains up despite this escalation Principal Problem: Treatment resistant schizophrenia/recent fall and head injury without acute bleed or subdural so forth Diagnosis: Active Problems:   Head injury   Schizophrenia, paranoid ([place])  Total Time spent with patient: 20 minutes  Past Psychiatric History: Extensive  Past Medical History:  Past Medical History:  Diagnosis Date  . Bipolar disorder ([place])   . Coronary artery disease   . GERD (gastroesophageal reflux disease)   . History of multiple strokes   . PTSD (post-traumatic stress disorder)     Past Surgical History:  Procedure Laterality Date  . CARDIAC CATHETERIZATION     Family History: No family history on file. Family Psychiatric  History: No new data Social History:  Social History   Substance and Sexual Activity  Alcohol Use No     Social History    Substance and Sexual Activity  Drug Use Never    Social History   Socioeconomic History  . Marital status: Legally Separated    Spouse name: Not on file  . Number of children: Not on file  . Years of education: Not on file  . Highest education level: Not on file  Occupational History  . Not on file  Social Needs  . Financial resource strain: Not on file  . Food insecurity    Worry: Not on file    Inability: Not on file  . Transportation needs    Medical: Not on file    Non-medical: Not on file  Tobacco Use  . Smoking status: Never Smoker  . Smokeless tobacco: Never Used  Substance and Sexual Activity  . Alcohol use: No  . Drug use: Never  . Sexual activity: Not on file  Lifestyle  . Physical activity    Days per week: Not on file    Minutes per session: Not on file  . Stress: Not on file  Relationships  . Social Herbalist on phone: Not on file    Gets together: Not on file    Attends religious service: Not on file    Active member of club or organization: Not on file    Attends meetings of clubs or organizations: Not on file    Relationship status: Not on file  Other Topics Concern  . Not on file  Social History Narrative  . Not on file  Additional Social History:                         Sleep: Fair  Appetite:  Fair  Current Medications: Current Facility-Administered Medications  Medication Dose Route Frequency Provider Last Rate Last Dose  . thiamine (B-1) injection 100 mg  100 mg Intravenous Daily Para Skeans, MD        Lab Results:  Results for orders placed or performed during the hospital encounter of 12/25/18 (from the past 48 hour(s))  Glucose, capillary     Status: None   Collection Time: 12/25/18  6:57 AM  Result Value Ref Range   Glucose-Capillary 99 70 - 99 mg/dL  TSH     Status: None   Collection Time: 12/25/18  7:08 AM  Result Value Ref Range   TSH 2.664 0.350 - 4.500 uIU/mL    Comment: Performed by a 3rd  Generation assay with a functional sensitivity of <=0.01 uIU/mL. Performed at [facility], [address] [place], [place] [address]   ESR     Status: None   Collection Time: 12/25/18  7:08 AM  Result Value Ref Range   Sed Rate 3 0 - 20 mm/hr    Comment: Performed at [facility], [place], [place] [address]  Troponin I (High Sensitivity)     Status: None   Collection Time: 12/25/18  7:08 AM  Result Value Ref Range   Troponin I (High Sensitivity) 5 <18 ng/L    Comment: (NOTE) Elevated high sensitivity troponin I (hsTnI) values and significant  changes across serial measurements may suggest ACS but many other  chronic and acute conditions are known to elevate hsTnI results.  Refer to the "Links" section for chest pain algorithms and additional  guidance. Performed at [facility], [address] [place], [place] [address]   Fibrin derivatives D-[facility] only)     Status: Abnormal   Collection Time: 12/25/18  7:08 AM  Result Value Ref Range   Fibrin derivatives D-dimer ([facility]) 710.31 (H) 0.00 - 499.00 ng/mL (FEU)    Comment: (NOTE) <> Exclusion of Venous Thromboembolism (VTE) - OUTPATIENT ONLY   (Emergency Department or [place])   0-499 ng/ml (FEU): With a low to intermediate pretest probability                      for VTE this test result excludes the diagnosis                      of VTE.   >499 ng/ml (FEU) : VTE not excluded; additional work up for VTE is                      required. <> Testing on Inpatients and Evaluation of Disseminated Intravascular   Coagulation (DIC) Reference Range:   0-499 ng/ml (FEU) Performed at [facility], [address] [place], [place] [address]   Blood gas, arterial     Status: Abnormal   Collection Time: 12/25/18  7:21 AM  Result Value Ref Range   FIO2 0.21    pH, Arterial 7.46 (H) 7.350 - 7.450   pCO2 arterial 38 32.0 - 48.0 mmHg   pO2, Arterial 77 (L) 83.0 - 108.0 mmHg    Bicarbonate 27.0 20.0 - 28.0 mmol/L   Acid-Base Excess 3.1 (H) 0.0 - 2.0 mmol/L   O2 Saturation 96.0 %   Patient  temperature 37.0    Collection site RIGHT RADIAL    Sample type ARTERIAL DRAW    Allens test (pass/fail) POSITIVE (A) PASS    Comment: Performed at [facility], [address] [place], [place] [address]  Glucose, capillary     Status: None   Collection Time: 12/25/18  7:47 AM  Result Value Ref Range   Glucose-Capillary 80 70 - 99 mg/dL  Ammonia     Status: None   Collection Time: 12/25/18  8:04 AM  Result Value Ref Range   Ammonia 16 9 - 35 umol/L    Comment: Performed at [facility], [place], [place] [address]  Troponin I (High Sensitivity)     Status: None   Collection Time: 12/25/18  9:13 AM  Result Value Ref Range   Troponin I (High Sensitivity) 4 <18 ng/L    Comment: (NOTE) Elevated high sensitivity troponin I (hsTnI) values and significant  changes across serial measurements may suggest ACS but many other  chronic and acute conditions are known to elevate hsTnI results.  Refer to the "Links" section for chest pain algorithms and additional  guidance. Performed at [facility], [address] [place], [place] [address]     Blood Alcohol level:  Lab Results  Component Value Date   [facility] <10 12/16/2018   ETH <10 38/46/6599    Metabolic Disorder Labs: Lab Results  Component Value Date   HGBA1C 5.5 11/03/2017   MPG 111 11/03/2017   MPG 123 01/29/2009   No results found for: PROLACTIN Lab Results  Component Value Date   CHOL 133 11/03/2017   TRIG 148 11/03/2017   HDL 44 11/03/2017   CHOLHDL 3.0 11/03/2017   VLDL 30 11/03/2017   LDLCALC 59 11/03/2017   LDLCALC 39 05/10/2015    Physical Findings: AIMS:  , ,  ,  ,    CIWA:    COWS:     Musculoskeletal: Strength & Muscle Tone: within normal limits  In bed gait and station not assessed Psychiatric Specialty Exam: Physical Exam  ROS  Blood  pressure (!) 134/93, pulse 81, temperature (!) 96.7 F (35.9 C), temperature source Axillary, resp. rate 14, height _0  (1.778 m), weight 76.8 kg, SpO2 100 %.Body mass index is 24.29 kg/m.  General Appearance: Casual  Eye Contact:  Fair  Speech:  Clear and Coherent  Volume:  Normal  Mood:  Euthymic  Affect:  Congruent  Thought Process:  Irrelevant and Descriptions of Associations: Loose  Orientation:  Full (Time, Place, and Person)  Thought Content:  Illogical and Delusions  Suicidal Thoughts:  No  Homicidal Thoughts:  No  Memory:  Immediate;   Fair Recent;   Fair Remote;   Fair  Judgement:  Fair  Insight:  Fair  Psychomotor Activity:  Decreased  Concentration:  Concentration: Fair and Attention Span: Fair  Recall:  [employer] of Knowledge:  Fair  Language:  Good  Akathisia:  Negative  Handed:  Right  AIMS (if indicated):     Assets:  Communication Skills Resilience  ADL's:  Intact  Cognition:  WNL  Sleep:        Treatment Plan Summary: Daily contact with patient to assess and evaluate symptoms and progress in treatment and Medication management   Patient is certainly cleared to be coming back to psychiatry no change in precautions once he arrives we will continue his meds but of course mindful of orthostasis we will minimize agents that can  cause this.  Fall precautions  Johnn Hai, MD 12/25/2018, 10:37 AM

## 2018-12-25 NOTE — ED Notes (Signed)
Report given to [employer] based on limited clinical information available. Labs pending. Pt unable to give detailed history of events. Vital signs stable. Will transport to ICU per admit orders.

## 2018-12-26 DIAGNOSIS — F319 Bipolar disorder, unspecified: Secondary | ICD-10-CM | POA: Diagnosis present

## 2018-12-26 DIAGNOSIS — F312 Bipolar disorder, current episode manic severe with psychotic features: Principal | ICD-10-CM

## 2018-12-26 LAB — LAMOTRIGINE LEVEL: Lamotrigine Lvl: <1 ug/mL — ABNORMAL LOW (ref 2.0–20.0)

## 2018-12-26 NOTE — Progress Notes (Signed)
D: Patient continues to be psychotic. Passing numerous hand-written notes full of delusional grandiose content, about buying this hospital, buying shoes for the patients, etc. Also says he is getting married and the governor and president-elect are among the invited guests and he needs to be out of here to get married to someone named IT sales professional. Has been up numerous times during the night. 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  A: Continue to monitor for safety R: Safety maintained.

## 2018-12-26 NOTE — [facility] Group Notes (Signed)
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
## 2018-12-26 NOTE — Plan of Care (Signed)
  Problem: Education: Goal: Will be free of psychotic symptoms Outcome: Not Progressing Goal: Knowledge of the prescribed therapeutic regimen will improve Outcome: Not Progressing  D: Patient continues to be psychotic. Passing numerous hand-written notes full of delusional grandiose content, about buying this hospital, buying shoes for the patients, etc. Also says he is getting married and the governor and president-elect are among the invited guests and he needs to be out of here to get married to someone named IT sales professional. Has been up numerous times during the night. 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  A: Continue to monitor for safety R: Safety maintained.

## 2018-12-26 NOTE — Progress Notes (Signed)
Recreation Therapy Notes          Austin Rhodes 12/26/2018 11:26 AM

## 2018-12-26 NOTE — Progress Notes (Signed)
Patient is preoccupied with getting his Super Beta Prostate. Patient stated that his younger brother passed away from Prostate Cancer and his older brother has Bladder Cancer. Patient also stated that he has an issue with "leaking".

## 2018-12-26 NOTE — [facility] Suicide Risk Assessment (Signed)
[place] INPATIENT:  Family/Significant Other Suicide Prevention Education  Suicide Prevention Education:  Contact Attempts: Verdie Barrows, daughter, [phone] has been identified by the patient as the family member/significant other with whom the patient will be residing, and identified as the person(s) who will aid the patient in the event of a mental health crisis.  With written consent from the patient, two attempts were made to provide suicide prevention education, prior to and/or following the patient's discharge.  We were unsuccessful in providing suicide prevention education.  A suicide education pamphlet was given to the patient to share with family/significant other.  Date and time of first attempt: 12/26/2018 at 12:53PM Date and time of second attempt: Second attempt is needed  Rozann Lesches 12/26/2018, 12:50 PM

## 2018-12-26 NOTE — Progress Notes (Signed)
Recreation Therapy Notes  INPATIENT RECREATION THERAPY ASSESSMENT  Patient Details Name: FENDER HERDER MRN: [id number] DOB: 01-11-1951 Today's Date: 12/26/2018       Information Obtained From: Patient(Patient unable to complete assessment.)  Able to Participate in Assessment/Interview:    Patient Presentation:    Reason for Admission (Per Patient):    Patient Stressors:    Coping Skills:      Leisure Interests (2+):     Frequency of Recreation/Participation:    Awareness of Community Resources:     [employer]:     Current Use:    If no, Barriers?:    Expressed Interest in [place] of Residence:     Patient Main Form of Transportation:    Patient Strengths:     Patient Identified Areas of Improvement:     Patient Goal for Hospitalization:     Current SI (including self-harm):     Current HI:     Current AVH:    Staff Intervention Plan:    Consent to Intern Participation:    Yassir Enis 12/26/2018, 3:01 PM

## 2018-12-26 NOTE — Plan of Care (Signed)
D- Patient alert and oriented. Patient presents in a preoccupied, but pleasant mood on assessment stating that he slept "pretty well" last night and had no complaints to voice to this Probation officer. Patient denies SI, HI, AVH, and pain at this time. Patient also denies any signs/symptoms of depression/anxiety to this writer, although he reported a "3/10" on his self-inventory. Patient's goal for today is "Austin Rhodes served with order from the [facility]", in which he will "call reports the action", in order to accomplish his goal.  A- Scheduled medications administered to patient, per MD orders. Support and encouragement provided.  Routine safety checks conducted every 15 minutes.  Patient informed to notify staff with problems or concerns.  R- No adverse drug reactions noted. Patient contracts for safety at this time. Patient compliant with medications and treatment plan. Patient receptive, calm, and cooperative. Patient interacts well with others on the unit.  Patient remains safe at this time.  Problem: Education: Goal: Knowledge of [name] Education information/materials will improve Outcome: Progressing Goal: Emotional status will improve Outcome: Progressing Goal: Mental status will improve Outcome: Progressing Goal: Verbalization of understanding the information provided will improve Outcome: Progressing   Problem: Safety: Goal: Periods of time without injury will increase Outcome: Progressing   Problem: Education: Goal: Will be free of psychotic symptoms Outcome: Progressing Goal: Knowledge of the prescribed therapeutic regimen will improve Outcome: Progressing   Problem: Coping: Goal: Coping ability will improve Outcome: Progressing Goal: Will verbalize feelings Outcome: Progressing   Problem: Health Behavior/Discharge Planning: Goal: Compliance with prescribed medication regimen will improve Outcome: Progressing

## 2018-12-26 NOTE — H&P (Signed)
Patient is transferred from the ICU, back to the psychiatry service  He was seen in the ICU on 11/22, at this point in time I found him to be alert still making delusional statements but no alterations in his consciousness or neurological deficits that are focal.  His CT scan is negative.  So he is cleared to be returned to psychiatry  In discussing his case with the ICU doctor it appears that he simply fell because he is a deconditioned psychiatric patient on multiple meds who woke up in the night and syncopal work-up at the discretion of his current attending  My note from this date reads as follows:    Austin Rhodes is a 68 year old patient well-known to the psychiatry service who has, at baseline, persistent delusions that are somewhat treatment resistant -he did have a fall in the night, in between his 15-minute precautionary checks, and since there was an abrasion the back of his head he was sent for a CT scan which has come back negative for any sort of acute bleed further he was sent to the ICU for monitoring due to lethargy     At the present time though he seems to have recalibrated to his baseline status.  His blood pressure still up.  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  The only change that I see might have contributed to his possible orthostasis and fall was an escalation in his Toprol however his blood pressure remains up despite this escalation  Principal Problem: Treatment resistant schizophrenia/recent fall and head injury without acute bleed or subdural so forth  Diagnosis: Active Problems:    Head injury    Schizophrenia, paranoid ([place])     Total Time spent with patient: 20 minutes     Past Psychiatric  History: Extensive     Treatment Plan Summary:  Daily contact with patient to assess and evaluate symptoms and progress in treatment and Medication management      Patient is certainly cleared to be coming back to psychiatry no change in precautions once he arrives we will continue his meds but of course mindful of orthostasis we will minimize agents that can cause this.   Fall precautions     Johnn Hai, MD  12/25/2018, 10:37 AM

## 2018-12-27 LAB — COMPREHENSIVE METABOLIC PANEL
ALT: 20 U/L (ref 0–44)
AST: 21 U/L (ref 15–41)
Albumin: 4.3 g/dL (ref 3.5–5.0)
Alkaline Phosphatase: 57 U/L (ref 38–126)
Anion gap: 12 (ref 5–15)
BUN: 13 mg/dL (ref 8–23)
CO2: 24 mmol/L (ref 22–32)
Calcium: 9.4 mg/dL (ref 8.9–10.3)
Chloride: 106 mmol/L (ref 98–111)
Creatinine, Ser: 0.99 mg/dL (ref 0.61–1.24)
GFR calc Af Amer: >60 mL/min (ref >60)
GFR calc non Af Amer: >60 mL/min (ref >60)
Glucose, Bld: 115 mg/dL — ABNORMAL HIGH (ref 70–99)
Potassium: 4.2 mmol/L (ref 3.5–5.1)
Sodium: 142 mmol/L (ref 135–145)
Total Bilirubin: 0.9 mg/dL (ref 0.3–1.2)
Total Protein: 7.4 g/dL (ref 6.5–8.1)

## 2018-12-27 MED ORDER — OLANZAPINE 5 MG PO TBDP: 10.0000 mg | ORAL_TABLET | Freq: Three times a day (TID) | ORAL | Status: DC | PRN

## 2018-12-27 MED ORDER — LORAZEPAM 1 MG PO TABS
1.0000 mg | ORAL_TABLET | ORAL | Status: AC | PRN
  Administered 2018-12-29: 1 mg via ORAL
  Filled 2018-12-27: qty 1

## 2018-12-27 MED ORDER — HALOPERIDOL 5 MG PO TABS
5.0000 mg | ORAL_TABLET | Freq: Three times a day (TID) | ORAL | Status: DC
  Administered 2018-12-27 – 2018-12-28 (×4): 5 mg via ORAL
  Filled 2018-12-27 (×4): qty 1

## 2018-12-27 MED ORDER — CARBAMAZEPINE 100 MG PO CHEW
200.0000 mg | CHEWABLE_TABLET | Freq: Two times a day (BID) | ORAL | Status: DC
  Administered 2018-12-27 – 2018-12-28 (×2): 200 mg via ORAL
  Filled 2018-12-27 (×3): qty 2

## 2018-12-27 MED ORDER — LORAZEPAM 2 MG/ML IJ SOLN
2.0000 mg | Freq: Once | INTRAMUSCULAR | Status: AC
  Administered 2018-12-27: 2 mg via INTRAMUSCULAR
  Filled 2018-12-27: qty 1

## 2018-12-27 MED ORDER — CARBAMAZEPINE 100 MG PO CHEW
100.0000 mg | CHEWABLE_TABLET | ORAL | Status: AC
  Administered 2018-12-27: 100 mg via ORAL
  Filled 2018-12-27 (×2): qty 1

## 2018-12-27 MED ORDER — CARBAMAZEPINE 100 MG PO CHEW
100.0000 mg | CHEWABLE_TABLET | Freq: Three times a day (TID) | ORAL | Status: DC
  Administered 2018-12-27: 100 mg via ORAL
  Filled 2018-12-27 (×3): qty 1

## 2018-12-27 MED ORDER — ZIPRASIDONE MESYLATE 20 MG IM SOLR: 20.0000 mg | INTRAMUSCULAR | Status: DC | PRN

## 2018-12-27 MED ORDER — HALOPERIDOL LACTATE 5 MG/ML IJ SOLN: 5.0000 mg | Freq: Three times a day (TID) | INTRAMUSCULAR | Status: DC

## 2018-12-27 MED ORDER — HALOPERIDOL 5 MG PO TABS: 5.0000 mg | ORAL_TABLET | Freq: Once | ORAL | Status: AC

## 2018-12-27 MED ORDER — DIPHENHYDRAMINE HCL 25 MG PO CAPS: 25.0000 mg | ORAL_CAPSULE | Freq: Once | ORAL | Status: AC

## 2018-12-27 MED ORDER — DIPHENHYDRAMINE HCL 50 MG/ML IJ SOLN
25.0000 mg | Freq: Once | INTRAMUSCULAR | Status: AC
  Administered 2018-12-27: 25 mg via INTRAMUSCULAR
  Filled 2018-12-27: qty 1

## 2018-12-27 MED ORDER — LORAZEPAM 2 MG PO TABS: 2.0000 mg | ORAL_TABLET | Freq: Once | ORAL | Status: AC

## 2018-12-27 MED ORDER — HALOPERIDOL LACTATE 5 MG/ML IJ SOLN
5.0000 mg | Freq: Once | INTRAMUSCULAR | Status: AC
  Administered 2018-12-27: 5 mg via INTRAMUSCULAR
  Filled 2018-12-27: qty 1

## 2018-12-27 NOTE — Plan of Care (Signed)
  Problem: Education: Goal: Will be free of psychotic symptoms 12/27/2018 0241 by Harl Bowie, RN Outcome: Not Progressing  Patient appears responding to internal stimuli

## 2018-12-27 NOTE — Plan of Care (Signed)
Pt rates depression 2/10 and anxiety 6/10. Pt denies SI, HI and AVH. Pt was educated on care plan and verbalizes understanding. Collier Bullock RN Problem: Education: Goal: Freight forwarder Education information/materials will improve Outcome: Not Progressing Goal: Emotional status will improve Outcome: Not Progressing Goal: Mental status will improve Outcome: Not Progressing Goal: Verbalization of understanding the information provided will improve Outcome: Not Progressing   Problem: Safety: Goal: Periods of time without injury will increase Outcome: Not Progressing   Problem: Education: Goal: Will be free of psychotic symptoms Outcome: Not Progressing Goal: Knowledge of the prescribed therapeutic regimen will improve Outcome: Not Progressing   Problem: Coping: Goal: Coping ability will improve Outcome: Not Progressing Goal: Will verbalize feelings Outcome: Not Progressing   Problem: Health Behavior/Discharge Planning: Goal: Compliance with prescribed medication regimen will improve Outcome: Not Progressing

## 2018-12-27 NOTE — BHH Counselor (Signed)
CSW received return phone call from Matty Vanroekel, daughter, [phone].  She reports that the patient was at baseline "a week before" his current hospitalization.  She reports concerns that the pt became confused by his medications and switched something up.    She reports that she would like for the patient to have home health to help with his medications.  CSW informed that this was unlikely and perhaps a referral could be made for CST.    She requests that when scheduling aftercare appointments that appointment is in the afternoon at 5pm if possible.    [name], MSW, LCSW 12/27/2018 12:07 PM

## 2018-12-27 NOTE — Progress Notes (Signed)
Patient alert and oriented x 2 with confusion to time and situation , he appears irritable, restless and verbally aggressive, he is F/U fall  PERRLA, no distress noted, he is not interacting appropriately with peers and staff, he denies SI/HI/AVH, he was offered emotional support, medicated per scheduled regimen and assisted to his room. 15 minutes safety checks maintained will continue to monitor.
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
## 2018-12-27 NOTE — BHH Counselor (Signed)
CSW attempted to return the missed call for the patient's daughter, Kasir Hallenbeck, daughter, [phone].  Call was not answered and CSW left HIPAA compliant voicemail.  [name], MSW, LCSW 12/27/2018 10:55 AM
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
## 2018-12-27 NOTE — Progress Notes (Signed)
Recreation Therapy Notes   Date: 12/27/2018  Time: 9:30 am   Location: Craft room   Behavioral response: N/A   Intervention Topic: Communication   Discussion/Intervention: Patient did not attend group.   Clinical Observations/Feedback:  Patient did not attend group.   Mark Benecke LRT/CTRS        Arwa Yero 12/27/2018 10:56 AM

## 2018-12-27 NOTE — BHH Group Notes (Signed)
LCSW Group Therapy Note  12/27/2018 1:00 PM  Type of Therapy/Topic:  Group Therapy:  Feelings about Diagnosis  Participation Level:  None   Description of Group:   This group will allow patients to explore their thoughts and feelings about diagnoses they have received. Patients will be guided to explore their level of understanding and acceptance of these diagnoses. Facilitator will encourage patients to process their thoughts and feelings about the reactions of others to their diagnosis and will guide patients in identifying ways to discuss their diagnosis with significant others in their lives. This group will be process-oriented, with patients participating in exploration of their own experiences, giving and receiving support, and processing challenge from other group members.   Therapeutic Goals: 1. Patient will demonstrate understanding of diagnosis as evidenced by identifying two or more symptoms of the disorder 2. Patient will be able to express two feelings regarding the diagnosis 3. Patient will demonstrate their ability to communicate their needs through discussion and/or role play  Summary of Patient Progress: Patient arrived to group late.  Patient was unable to program to the group. Patient began making grandiose statements that he owned a jet plane and "fly around the atmosphere".  Therapeutic Modalities:   Cognitive Behavioral Therapy Brief Therapy Feelings Identification   [name], MSW, LCSW 12/27/2018 12:40 PM

## 2018-12-28 MED ORDER — TAMSULOSIN HCL 0.4 MG PO CAPS
0.4000 mg | ORAL_CAPSULE | Freq: Every day | ORAL | Status: DC
  Administered 2018-12-29 – 2019-01-08 (×8): 0.4 mg via ORAL
  Filled 2018-12-28 (×17): qty 1

## 2018-12-28 MED ORDER — QUETIAPINE FUMARATE 200 MG PO TABS: 200.0000 mg | ORAL_TABLET | Freq: Every day | ORAL | Status: DC

## 2018-12-28 MED ORDER — QUETIAPINE FUMARATE 100 MG PO TABS
100.0000 mg | ORAL_TABLET | ORAL | Status: AC
  Administered 2018-12-28: 100 mg via ORAL
  Filled 2018-12-28: qty 1

## 2018-12-28 MED ORDER — CARBAMAZEPINE 100 MG PO CHEW
200.0000 mg | CHEWABLE_TABLET | Freq: Three times a day (TID) | ORAL | Status: DC
  Administered 2018-12-28 – 2018-12-29 (×3): 200 mg via ORAL
  Filled 2018-12-28 (×3): qty 2

## 2018-12-28 MED ORDER — QUETIAPINE FUMARATE 200 MG PO TABS
400.0000 mg | ORAL_TABLET | Freq: Every day | ORAL | Status: DC
  Administered 2018-12-28: 400 mg via ORAL
  Filled 2018-12-28: qty 2

## 2018-12-28 NOTE — Progress Notes (Signed)
Patient has been back and forth to the nurses station multiple times today stating that he is ready to discharge and that he has "thirty-seven post-graduate degrees, I would like you to write a few things for my magazine and I'll pay you the-thousand dollars a month". 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 Patient also stated that if the doctor doesn't have him out of here by a certain time "he's fired".

## 2018-12-28 NOTE — BHH Counselor (Signed)
CSW spoke with [facility] at [facility] [phone].  CSW asked about referring patient to forensic testing/evaluation.  [facility] reports that they do have the capability to complete the testing/evaluation.  Romandi also reported that this type of referral has to come from a judge.  [name], MSW, LCSW 12/28/2018 2:40 PM

## 2018-12-28 NOTE — BHH Counselor (Signed)
At the request of physician(Clary) CSW contacted [place] spoke with Tammy at [facility] [phone] to inquire about referring a pt for forensic evaluation. Tammy reports they do not complete outpatient psychiatric evaluations and they refer their patients to [facility] or another state hospital to have the evaluation completed. CSW relayed this information to the physician.

## 2018-12-28 NOTE — Plan of Care (Signed)
  Problem: Education: Goal: Will be free of psychotic symptoms Outcome: Progressing  Patient appears less psychotic, thoughts are disorganized.

## 2018-12-28 NOTE — Progress Notes (Signed)
Patient alert and oriented x 3 with periods of confusion to situation, his thoughts are disorganized , speech is soft non pressured, isolated to his room, in bed with eyes closed sometimes, no distress noted interacting appropriately with peers and staff, 15 minutes safety checks maintained.

## 2018-12-28 NOTE — Progress Notes (Signed)
Recreation Therapy Notes  Date: 12/28/2018  Time: 9:30 am   Location: Craft room   Behavioral response: N/A   Intervention Topic: Problem Solving   Discussion/Intervention: Patient did not attend group.   Clinical Observations/Feedback:  Patient did not attend group.   Teondre Jarosz LRT/CTRS         Kathrina Crosley 12/28/2018 10:47 AM

## 2018-12-28 NOTE — Progress Notes (Signed)
Patient just came to the nurses station, again, preoccupied with leaving at 12:01am and stated that "everything is in order for me to leave and when the time comes, I'll sign the papers myself and walk out of here". This Probation officer keeps trying to reiterate to patient that there are no discharge orders in for him to leave. Patient disregards what this writer has to say and stated again, "I'll sign them myself", and walks off and goes back to his room.
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
## 2018-12-28 NOTE — B[facility] Group Notes (Signed)
Emotional Regulation 12/28/2018 1PM  Type of Therapy/Topic:  Group Therapy:  Emotion Regulation  Participation Level:  Did Not Attend   Description of Group:   The purpose of this group is to assist patients in learning to regulate negative emotions and experience positive emotions. Patients will be guided to discuss ways in which they have been vulnerable to their negative emotions. These vulnerabilities will be juxtaposed with experiences of positive emotions or situations, and patients will be challenged to use positive emotions to combat negative ones. Special emphasis will be placed on coping with negative emotions in conflict situations, and patients will process healthy conflict resolution skills.  Therapeutic Goals: 1. Patient will identify two positive emotions or experiences to reflect on in order to balance out negative emotions 2. Patient will label two or more emotions that they find the most difficult to experience 3. Patient will demonstrate positive conflict resolution skills through discussion and/or role plays  Summary of Patient Progress:       Therapeutic Modalities:   Cognitive Behavioral Therapy Feelings Identification Dialectical Behavioral Therapy   Aubrei Bouchie T Jayli Fogleman, LCSW 12/28/2018 2:17 PM  

## 2018-12-28 NOTE — Plan of Care (Signed)
D- Patient alert and oriented. Patient presents in a preoccupied mood with being discharged and that he slept "very well" last night and had complaints of left toe pain. Patient rated his pain level a "6/10", and he did request pain medication from this Probation officer. Patient denied SI, HI, AVH, at this time. Patient also denied signs/symptoms of depression/anxiety. Patient's goal for today is "Trump family served by [employer] with warrant from the world court".  A- Scheduled medications administered to patient, per MD orders. Support and encouragement provided.  Routine safety checks conducted every 15 minutes.  Patient informed to notify staff with problems or concerns.  R- No adverse drug reactions noted. Patient contracts for safety at this time. Patient compliant with medications and treatment plan. Patient receptive, calm, and cooperative. Patient interacts well with others on the unit.  Patient remains safe at this time.  Problem: Education: Goal: Knowledge of [facility] Education information/materials will improve 12/28/2018 1417 by Lyda Kalata, RN Outcome: Not Progressing 12/28/2018 1219 by Lyda Kalata, RN Outcome: Progressing Goal: Emotional status will improve 12/28/2018 1417 by Lyda Kalata, RN Outcome: Not Progressing 12/28/2018 1219 by Lyda Kalata, RN Outcome: Progressing Goal: Mental status will improve 12/28/2018 1417 by Lyda Kalata, RN Outcome: Not Progressing 12/28/2018 1219 by Lyda Kalata, RN Outcome: Progressing Goal: Verbalization of understanding the information provided will improve 12/28/2018 1417 by Lyda Kalata, RN Outcome: Not Progressing 12/28/2018 1219 by Lyda Kalata, RN Outcome: Progressing   Problem: Safety: Goal: Periods of time without injury will increase 12/28/2018 1417 by Lyda Kalata, RN Outcome: Not Progressing 12/28/2018 1219 by Lyda Kalata, RN Outcome: Progressing   Problem:  Education: Goal: Will be free of psychotic symptoms 12/28/2018 1417 by Lyda Kalata, RN Outcome: Not Progressing 12/28/2018 1219 by Lyda Kalata, RN Outcome: Progressing Goal: Knowledge of the prescribed therapeutic regimen will improve 12/28/2018 1417 by Lyda Kalata, RN Outcome: Not Progressing 12/28/2018 1219 by Lyda Kalata, RN Outcome: Progressing   Problem: Coping: Goal: Coping ability will improve 12/28/2018 1417 by Lyda Kalata, RN Outcome: Not Progressing 12/28/2018 1219 by Lyda Kalata, RN Outcome: Progressing Goal: Will verbalize feelings 12/28/2018 1417 by Lyda Kalata, RN Outcome: Not Progressing 12/28/2018 1219 by Lyda Kalata, RN Outcome: Progressing   Problem: Health Behavior/Discharge Planning: Goal: Compliance with prescribed medication regimen will improve 12/28/2018 1417 by Lyda Kalata, RN Outcome: Not Progressing 12/28/2018 1219 by Lyda Kalata, RN Outcome: Progressing

## 2018-12-29 MED ORDER — LORAZEPAM 1 MG PO TABS
1.0000 mg | ORAL_TABLET | ORAL | Status: AC | PRN
  Administered 2019-01-04: 1 mg via ORAL
  Filled 2018-12-29: qty 1

## 2018-12-29 MED ORDER — BENZTROPINE MESYLATE 1 MG PO TABS: 0.5000 mg | ORAL_TABLET | Freq: Two times a day (BID) | ORAL | Status: DC | PRN

## 2018-12-29 MED ORDER — DIVALPROEX SODIUM 500 MG PO DR TAB
500.0000 mg | DELAYED_RELEASE_TABLET | Freq: Every evening | ORAL | Status: DC
  Administered 2018-12-29: 500 mg via ORAL
  Filled 2018-12-29: qty 1

## 2018-12-29 MED ORDER — ZIPRASIDONE MESYLATE 20 MG IM SOLR
20.0000 mg | INTRAMUSCULAR | Status: DC | PRN
  Filled 2018-12-29: qty 20

## 2018-12-29 MED ORDER — QUETIAPINE FUMARATE 200 MG PO TABS
600.0000 mg | ORAL_TABLET | Freq: Every day | ORAL | Status: DC
  Administered 2018-12-29 – 2019-01-06 (×9): 600 mg via ORAL
  Filled 2018-12-29 (×9): qty 3

## 2018-12-29 MED ORDER — OLANZAPINE 5 MG PO TBDP
10.0000 mg | ORAL_TABLET | Freq: Three times a day (TID) | ORAL | Status: DC | PRN
  Administered 2019-01-04 – 2019-01-08 (×6): 10 mg via ORAL
  Filled 2018-12-29 (×6): qty 2

## 2018-12-29 NOTE — Progress Notes (Signed)
   12/29/18 1200  Clinical Encounter Type  Visited With Patient  Visit Type Initial  Referral From Patient  Ch was rounding in BM when the pt approached and requested a visit. 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 Pt seems delusional in his thinking but was calm and free of distress. Ch provided active listening and focused on attending to pt's emotions. Pt seemed to enjoy being heard and having somebody to talk to. The visit was appreciated.
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
## 2018-12-29 NOTE — Tx Team (Signed)
Interdisciplinary Treatment and Diagnostic Plan Update  12/29/2018 Time of Session: 830am Austin Rhodes MRN: [id number]  Principal Diagnosis: Bipolar I disorder, current or most recent episode manic, with psychotic features ([place])  Secondary Diagnoses: Principal Problem:   Bipolar I disorder, current or most recent episode manic, with psychotic features ([place]) Active Problems:   Affective psychosis, bipolar ([place])   Bipolar disorder ([place])   Current Medications:  Current Facility-Administered Medications  Medication Dose Route Frequency Provider Last Rate Last Dose  . acetaminophen (TYLENOL) tablet 650 mg  650 mg Oral Q6H PRN Johnn Hai, MD   650 mg at 12/28/18 1850  . alum & mag hydroxide-simeth (MAALOX/MYLANTA) 200-200-20 MG/5ML suspension 30 mL  30 mL Oral Q4H PRN Johnn Hai, MD      . benztropine (COGENTIN) tablet 0.5 mg  0.5 mg Oral BID PRN Sharma Covert, MD      . carbamazepine (TEGRETOL) chewable tablet 200 mg  200 mg Oral Q8H Sharma Covert, MD   200 mg at 12/29/18 2836  . clopidogrel (PLAVIX) tablet 75 mg  75 mg Oral Daily Johnn Hai, MD   75 mg at 12/29/18 0825  . hydrOXYzine (ATARAX/VISTARIL) tablet 50 mg  50 mg Oral TID PRN Johnn Hai, MD   50 mg at 12/26/18 2222  . OLANZapine zydis (ZYPREXA) disintegrating tablet 10 mg  10 mg Oral Q8H PRN Sharma Covert, MD       And  . LORazepam (ATIVAN) tablet 1 mg  1 mg Oral PRN Sharma Covert, MD       And  . ziprasidone (GEODON) injection 20 mg  20 mg Intramuscular PRN Sharma Covert, MD      . magnesium hydroxide (MILK OF MAGNESIA) suspension 30 mL  30 mL Oral Daily PRN Johnn Hai, MD      . metoprolol succinate (TOPROL-XL) 24 hr tablet 50 mg  50 mg Oral Daily Johnn Hai, MD   50 mg at 12/29/18 0825  . QUEtiapine (SEROQUEL) tablet 600 mg  600 mg Oral QHS Sharma Covert, MD      . tamsulosin [facility]) capsule 0.4 mg  0.4 mg Oral QPC supper Sharma Covert, MD       PTA  Medications: Medications Prior to Admission  Medication Sig Dispense Refill Last Dose  . acetaminophen (TYLENOL) 325 MG tablet Take 2 tablets (650 mg total) by mouth every 6 (six) hours as needed for mild pain or moderate pain. 30 tablet 0   . alum & mag hydroxide-simeth (MAALOX/MYLANTA) 200-200-20 MG/5ML suspension Take 30 mLs by mouth every 4 (four) hours as needed for indigestion. 355 mL 0   . B Complex-Biotin-FA (B COMPLETE PO) Take 1 tablet by mouth daily.     . benztropine (COGENTIN) 1 MG tablet Take 1 tablet (1 mg total) by mouth 2 (two) times daily.     . clopidogrel (PLAVIX) 75 MG tablet Take 1 tablet (75 mg total) by mouth daily. 30 tablet 3   . divalproex (DEPAKOTE) 250 MG DR tablet Take 250 mg by mouth 2 (two) times daily.     Marland Kitchen lamoTRIgine (LAMICTAL) 200 MG tablet Take 1 tablet (200 mg total) by mouth daily. 30 tablet 1   . magnesium hydroxide (MILK OF MAGNESIA) 400 MG/5ML suspension Take 30 mLs by mouth daily as needed for mild constipation. 355 mL 0   . metoprolol succinate (TOPROL-XL) 25 MG 24 hr tablet Take 1 tablet (25 mg total) by mouth daily. 30 tablet 1   .  Multiple Vitamin (MULTIVITAMIN WITH MINERALS) TABS tablet Take 1 tablet by mouth daily.     Marland Kitchen perphenazine (TRILAFON) 4 MG tablet Take 1 tablet (4 mg total) by mouth 3 (three) times daily.     . QUEtiapine (SEROQUEL XR) 200 MG 24 hr tablet Take 200 mg by mouth at bedtime.     . rosuvastatin (CRESTOR) 10 MG tablet Take 1 tablet (10 mg total) by mouth daily. 30 tablet 1   . thiamine (B-1) 100 MG/ML injection Inject 1 mL (100 mg total) into the vein daily. 25 mL      Patient Stressors: Health problems Medication change or noncompliance  Patient Strengths: Curator fund of knowledge Supportive family/friends  Treatment Modalities: Medication Management, Group therapy, Case management,  1 to 1 session with clinician, Psychoeducation, Recreational therapy.   Physician Treatment Plan for Primary  Diagnosis: Bipolar I disorder, current or most recent episode manic, with psychotic features ([place]) Long Term Goal(s):     Short Term Goals:    Medication Management: Evaluate patient's response, side effects, and tolerance of medication regimen.  Therapeutic Interventions: 1 to 1 sessions, Unit Group sessions and Medication administration.  Evaluation of Outcomes: Not Met  Physician Treatment Plan for Secondary Diagnosis: Principal Problem:   Bipolar I disorder, current or most recent episode manic, with psychotic features ([place]) Active Problems:   Affective psychosis, bipolar ([place])   Bipolar disorder ([place])  Long Term Goal(s):     Short Term Goals:       Medication Management: Evaluate patient's response, side effects, and tolerance of medication regimen.  Therapeutic Interventions: 1 to 1 sessions, Unit Group sessions and Medication administration.  Evaluation of Outcomes: Not Met   RN Treatment Plan for Primary Diagnosis: Bipolar I disorder, current or most recent episode manic, with psychotic features ([place]) Long Term Goal(s): Knowledge of disease and therapeutic regimen to maintain health will improve  Short Term Goals: Ability to remain free from injury will improve, Ability to demonstrate self-control, Ability to participate in decision making will improve and Compliance with prescribed medications will improve  Medication Management: RN will administer medications as ordered by provider, will assess and evaluate patient's response and provide education to patient for prescribed medication. RN will report any adverse and/or side effects to prescribing provider.  Therapeutic Interventions: 1 on 1 counseling sessions, Psychoeducation, Medication administration, Evaluate responses to treatment, Monitor vital signs and CBGs as ordered, Perform/monitor CIWA, COWS, AIMS and Fall Risk screenings as ordered, Perform wound care treatments as ordered.  Evaluation of Outcomes: Not  Met   LCSW Treatment Plan for Primary Diagnosis: Bipolar I disorder, current or most recent episode manic, with psychotic features ([place]) Long Term Goal(s): Safe transition to appropriate next level of care at discharge, Engage patient in therapeutic group addressing interpersonal concerns.  Short Term Goals: Engage patient in aftercare planning with referrals and resources, Facilitate acceptance of mental health diagnosis and concerns, Identify triggers associated with mental health/substance abuse issues and Increase skills for wellness and recovery  Therapeutic Interventions: Assess for all discharge needs, 1 to 1 time with Social worker, Explore available resources and support systems, Assess for adequacy in community support network, Educate family and significant other(s) on suicide prevention, Complete Psychosocial Assessment, Interpersonal group therapy.  Evaluation of Outcomes: Not Met   Progress in Treatment: Attending groups: No. Participating in groups: No. Taking medication as prescribed: Yes. Toleration medication: Yes. Family/Significant other contact made: Yes, individual(s) contacted:  Tyler Aas, daughter Patient understands diagnosis: No. Discussing  patient identified problems/goals with staff: Yes. Medical problems stabilized or resolved: Yes. Denies suicidal/homicidal ideation: Yes. Issues/concerns per patient self-inventory: No. Other: N/A  New problem(s) identified: No, Describe:  none  New Short Term/Long Term Goal(s): Detox, elimination of AVH/symptoms of psychosis, medication management for mood stabilization; elimination of SI thoughts; development of comprehensive mental wellness/sobriety plan.   Patient Goals:  "Put Trump in jail"  Discharge Plan or Barriers: SPE pamphlet, Mobile Crisis information, and AA/NA information provided to patient for additional community support and resources. CSW assessing for appropriate referrals. Update 12/29/18-pt still  displaying delusions and manic behavior. Discharge plan TBD.  Reason for Continuation of Hospitalization: Delusions  Mania Medication stabilization  Estimated Length of Stay: 5-7 days  Recreational Therapy: Patient Stressors: N/A  Patient Goal: Patient will focus on task/topic with 2 prompts from staff within 5 recreation therapy group  Sessions Attendees: Patient: 12/24/2018 9:14 AM  Physician: Myles Lipps 12/24/2018 9:14 AM  Nursing: Polly Cobia 12/24/2018 9:14 AM  RN Care Manager: 12/24/2018 9:14 AM  Social Worker: Sanjuana Kava 12/24/2018 9:14 AM  Recreational Therapist:  12/24/2018 9:14 AM  Other:  12/24/2018 9:14 AM  Other:  12/24/2018 9:14 AM  Other: 12/24/2018 9:14 AM        Scribe for Treatment Team: Yvette Rack, LCSW 12/29/2018 10:47 AM

## 2018-12-29 NOTE — Plan of Care (Signed)
  Problem: Education: Goal: Will be free of psychotic symptoms Outcome: Not Progressing Goal: Knowledge of the prescribed therapeutic regimen will improve Outcome: Not Progressing  D: Patient continues to be grandiose and delusional. Now saying that he is a doctor. Up during the night a couple of times asking me to call his daughter so that he can sign his discharge papers and go home. Calmer and more redirectable. A: Continue to monitor for safety R: Safety maintained.

## 2018-12-29 NOTE — Progress Notes (Signed)
D: Patient continues to be grandiose and delusional. Now saying that he is a doctor. Up during the night a couple of times asking me to call his daughter so that he can sign his discharge papers and go home. Calmer and more redirectable. A: Continue to monitor for safety R: Safety maintained.

## 2018-12-29 NOTE — Plan of Care (Signed)
Patient is less intrusive and easily redirectable till dinner time today.Patient insisting for a transfer to [place] for a brain scan because he had a fall last year.Patient states in a demanding  way "this should happen today." Redirected with support and encouragement.Tylenol given for  headache.Compliant with meds.Appetite and energy level good.

## 2018-12-30 MED ORDER — DIVALPROEX SODIUM 500 MG PO DR TAB
750.0000 mg | DELAYED_RELEASE_TABLET | Freq: Every evening | ORAL | Status: DC
  Administered 2018-12-30 – 2018-12-31 (×2): 750 mg via ORAL
  Filled 2018-12-30 (×2): qty 1

## 2018-12-30 NOTE — Plan of Care (Signed)
  Problem: Education: Goal: Knowledge of [place] [facility] Education information/materials will improve Outcome: Not Progressing Goal: Emotional status will improve Outcome: Not Progressing Goal: Mental status will improve Outcome: Not Progressing Goal: Verbalization of understanding the information provided will improve Outcome: Not Progressing  D: Patient continues to be delusional. 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 Pleasant and cooperative. Med compliant. Less attention seeking. A: Continue to monitor for safety R: Safety maintained

## 2018-12-30 NOTE — Progress Notes (Signed)
Patient has been walking around the unit and continues to approach the nurses station with notes. He continues to have grandiose thoughts. Austin Rhodes has been compliant with his medications. He has been interacting with peers and staff. The patient was noted to present in all groups.

## 2018-12-30 NOTE — [facility] Group Notes (Signed)
LCSW Group Therapy Note  12/30/2018 1:00 PM  Type of Therapy/Topic:  Group Therapy:  Balance in Life  Participation Level:  Minimal  Description of Group:    This group will address the concept of balance and how it feels and looks when one is unbalanced. Patients will be encouraged to process areas in their lives that are out of balance and identify reasons for remaining unbalanced. Facilitators will guide patients in utilizing problem-solving interventions to address and correct the stressor making their life unbalanced. Understanding and applying boundaries will be explored and addressed for obtaining and maintaining a balanced life. Patients will be encouraged to explore ways to assertively make their unbalanced needs known to significant others in their lives, using other group members and facilitator for support and feedback.  Therapeutic Goals: 1. Patient will identify two or more emotions or situations they have that consume much of in their lives. 2. Patient will identify signs/triggers that life has become out of balance:  3. Patient will identify two ways to set boundaries in order to achieve balance in their lives:  4. Patient will demonstrate ability to communicate their needs through discussion and/or role plays  Summary of Patient Progress: Patient was present, however had to be redirected several times. 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  Therapeutic Modalities:   Cognitive Behavioral Therapy Solution-Focused Therapy Assertiveness Training  [name] MSW, LCSW 12/30/2018 12:31 PM

## 2018-12-30 NOTE — Progress Notes (Signed)
D: Patient continues to be delusional. 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 Pleasant and cooperative. Med compliant. Less attention seeking. A: Continue to monitor for safety R: Safety maintained

## 2018-12-30 NOTE — Progress Notes (Signed)
Recreation Therapy Notes  Date: 12/30/2018  Time: 9:30 am   Location: Craft room   Behavioral response: N/A   Intervention Topic: Necessities   Discussion/Intervention: Patient did not attend group.   Clinical Observations/Feedback:  Patient did not attend group.   Stasia Somero LRT/CTRS        Tasheena Wambolt 12/30/2018 11:00 AM

## 2018-12-30 NOTE — Plan of Care (Signed)
  Problem: Education: Goal: Knowledge of [place] [facility] Education information/materials will improve Outcome: Not Progressing Goal: Emotional status will improve Outcome: Not Progressing Goal: Mental status will improve Outcome: Not Progressing Goal: Verbalization of understanding the information provided will improve Outcome: Not Progressing  D: Patient continues to be delusional and grandiose. 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 Asked me to google the 12 million dollar home that he owns on [address]. Pleasant. Redirectable. A: Continue to monitor for safety R: Safety maintained.

## 2018-12-30 NOTE — Progress Notes (Signed)
D: Patient continues to be delusional and grandiose. 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 Asked me to google the 12 million dollar home that he owns on [address]. Pleasant. Redirectable. A: Continue to monitor for safety R: Safety maintained.
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
## 2018-12-31 NOTE — [facility] Group Notes (Addendum)
CSW did not have group today on Overcoming Obstacles due to patient's have recreational time and unit acuity.    Sharell Hilmer, MSW, LCSW Clinical Social Worker II   [facility] Phone: [phone] Fax: [phone]  

## 2018-12-31 NOTE — Progress Notes (Signed)
Patient continues to be grandiose, intrusive and psychotic.  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 Continues to come back and forth to nurses station with grandiose ideas. Denies any SI, HI. Reports had a fall with head trauma and asked for tylenol. Given, Probation officer awaiting effectiveness.  Patient continues to be intrusive with staff and peers. Not easy to redirect at times. Encouragement and support offered. Medications given as prescribed. Safety checks maintained. Pt receptive and remains safe on unit with q 15 min checks.

## 2018-12-31 NOTE — Plan of Care (Signed)
  Problem: Education: Goal: Emotional status will improve Outcome: Progressing   Problem: Safety: Goal: Periods of time without injury will increase Outcome: Progressing   Problem: Education: Goal: Knowledge of the prescribed therapeutic regimen will improve Outcome: Progressing   Problem: Education: Goal: Knowledge of [place] [facility] Education information/materials will improve Outcome: Not Progressing Goal: Mental status will improve Outcome: Not Progressing Goal: Verbalization of understanding the information provided will improve Outcome: Not Progressing   Problem: Education: Goal: Will be free of psychotic symptoms Outcome: Not Progressing

## 2019-01-01 LAB — VALPROIC ACID LEVEL: Valproic Acid Lvl: 49 ug/mL — ABNORMAL LOW (ref 50.0–100.0)

## 2019-01-01 MED ORDER — DIVALPROEX SODIUM 500 MG PO DR TAB
1000.0000 mg | DELAYED_RELEASE_TABLET | Freq: Every evening | ORAL | Status: DC
  Administered 2019-01-01 – 2019-01-05 (×5): 1000 mg via ORAL
  Filled 2019-01-01 (×5): qty 2

## 2019-01-01 NOTE — [facility] Group Notes (Signed)
[facility] LCSW Group Therapy Note  Date/Time: 01/01/2019 @ 1:30pm  Type of Therapy and Topic:  Group Therapy:  Overcoming Obstacles  Participation Level:  [facility] PARTICIPATION LEVEL: Did Not Attend  Description of Group:    In this group patients will be encouraged to explore what they see as obstacles to their own wellness and recovery. They will be guided to discuss their thoughts, feelings, and behaviors related to these obstacles. The group will process together ways to cope with barriers, with attention given to specific choices patients can make. Each patient will be challenged to identify changes they are motivated to make in order to overcome their obstacles. This group will be process-oriented, with patients participating in exploration of their own experiences as well as giving and receiving support and challenge from other group members.  Therapeutic Goals: 1. Patient will identify personal and current obstacles as they relate to admission. 2. Patient will identify barriers that currently interfere with their wellness or overcoming obstacles.  3. Patient will identify feelings, thought process and behaviors related to these barriers. 4. Patient will identify two changes they are willing to make to overcome these obstacles:    Summary of Patient Progress   Patient did not attend group therapy today.    Therapeutic Modalities:   Cognitive Behavioral Therapy Solution Focused Therapy Motivational Interviewing Relapse Prevention Therapy   Clem Wisenbaker, LCSW  
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
## 2019-01-01 NOTE — Plan of Care (Signed)
  Problem: Education: Goal: Emotional status will improve Outcome: Progressing   Problem: Safety: Goal: Periods of time without injury will increase Outcome: Progressing   Problem: Education: Goal: Mental status will improve Outcome: Not Progressing Goal: Verbalization of understanding the information provided will improve Outcome: Not Progressing   Problem: Education: Goal: Will be free of psychotic symptoms Outcome: Not Progressing

## 2019-01-01 NOTE — Plan of Care (Signed)
  Problem: Education: Goal: Will be free of psychotic symptoms Outcome: Not Progressing Goal: Knowledge of the prescribed therapeutic regimen will improve Outcome: Not Progressing  D: Patient continues to be psychotic and delusional. Today he says he and someone named Aura are taping Mr & Mrs Theodis Shove, and that a helicopter is picking him up at his house. Says he wants this author to forward some information to [facility] about [employer]. Continues to pass notes to staff full of delusional content. 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 Denies SI and HI. Pleasant and cooperative. Also says he is firing Dr. Mallie Darting due to conflict of interest because he is suing him and wants Dr. Danella Sensing to be his only physician on his case and states that Dr. Danella Sensing has agreed to this. Also says Dr. Danella Sensing told him if nursing staff could set up an appointment for him to have a brain scan, at any facility other than [facility], he could go home. Calls his daughter daily and tells her he needs to be picked up. A: Continue to monitor for safety R: Safety maintained.

## 2019-01-01 NOTE — Progress Notes (Signed)
D: Patient continues to be persistently psychotic with various grandiose delusions. He says he owns a 250 million dollar home with Selena Batten. He is offering different staff members, including this Pryor Curia, large amounts of money to come and work for him, with luxury homes and vehicles as bonuses. He is fixated on going home or being transferred to [facility], and he has been calling his daughter daily, telling her he is discharged and needs to be picked up. He told his daughter to call this Pryor Curia and told her that there was a plan to transfer him to [facility], which is not the case. He insists he cannot sleep in the bed here, although he slept last night. He becomes agitated when presented with reality. Whenever he verbalizes his delusions, thanking him for getting Trump out of office seems to placate him and he calms down and it is possible to change the subject and gently redirect him. He is much less needy and attention seeking, now saying that the cause of his problems is being taken off of his Depakote. Daughter says that he is not delusional at baseline and also states that she believes that if he had not been taken off of Depakote he would not be this sick. A: Continue to monitor for safety R: Safety maintained.

## 2019-01-01 NOTE — Plan of Care (Signed)
  Problem: Education: Goal: Will be free of psychotic symptoms Outcome: Not Progressing Goal: Knowledge of the prescribed therapeutic regimen will improve Outcome: Not Progressing  D: Patient continues to be persistently psychotic with various grandiose delusions. He says he owns a 250 million dollar home with Selena Batten. He is offering different staff members, including this Pryor Curia, large amounts of money to come and work for him, with luxury homes and vehicles as bonuses. He is fixated on going home or being transferred to [facility], and he has been calling his daughter daily, telling her he is discharged and needs to be picked up. He told his daughter to call this Pryor Curia and told her that there was a plan to transfer him to [facility], which is not the case. He insists he cannot sleep in the bed here, although he slept last night. He becomes agitated when presented with reality. Whenever he verbalizes his delusions, thanking him for getting Trump out of office seems to placate him and he calms down and it is possible to change the subject and gently redirect him. He is much less needy and attention seeking, now saying that the cause of his problems is being taken off of his Depakote. Daughter says that he is not delusional at baseline and also states that she believes that if he had not been taken off of Depakote he would not be this sick. A: Continue to monitor for safety R: Safety maintained.

## 2019-01-01 NOTE — Progress Notes (Signed)
D: Patient continues to be psychotic and delusional. Today he says he and someone named Aura are taping Mr & Mrs Theodis Shove, and that a helicopter is picking him up at his house. Says he wants this author to forward some information to [facility] [place] about [employer]. Continues to pass notes to staff full of delusional content. 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 Denies SI and HI. Pleasant and cooperative. Also says he is firing Dr. Mallie Darting due to conflict of interest because he is suing him and wants Dr. Danella Sensing to be his only physician on his case and states that Dr. Danella Sensing has agreed to this. Also says Dr. Danella Sensing told him if nursing staff could set up an appointment for him to have a brain scan, at any facility other than [facility], he could go home. Calls his daughter daily and tells her he needs to be picked up. A: Continue to monitor for safety R: Safety maintained.

## 2019-01-01 NOTE — Progress Notes (Signed)
Patient continues to be delusional, grandiose. 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 Intrusive at times, but can be re-directed. Hyper verbal. Continues to try and control the milieu. Encouragement and support offered. Redirected patient as needed. Pt remains safe with q 15 min checks.

## 2019-01-01 NOTE — Progress Notes (Signed)
Patient has come to this writer on multiple occassions demanding that he needs a brain scan and would prefer it be done at another facility other than [facility] because of the "conflict of interest". Patient has also passed multiple notes under the door to the nurses station about he and Birdie Sons will be running for office in 2024. 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 Patient also states that he needs "my computer, my cell phone and I need this done soon, it's almost 4 o'clock".

## 2019-01-02 MED ORDER — LITHIUM CARBONATE 300 MG PO CAPS
300.0000 mg | ORAL_CAPSULE | Freq: Two times a day (BID) | ORAL | Status: DC
  Administered 2019-01-02: 300 mg via ORAL
  Filled 2019-01-02 (×2): qty 1

## 2019-01-02 MED ORDER — LORAZEPAM 2 MG/ML IJ SOLN
2.0000 mg | Freq: Four times a day (QID) | INTRAMUSCULAR | Status: DC | PRN
  Administered 2019-01-02 – 2019-01-08 (×2): 2 mg via INTRAMUSCULAR
  Filled 2019-01-02 (×2): qty 1

## 2019-01-02 NOTE — Plan of Care (Signed)
Patient passes multiple notes to nurses station and at the nurse station multiple times with the same delusional and grandiose talks.Patient was following Dr..Clapacs when he was making rounds.Compliant with medications.Appetite and energy level good.Support and  encouragement given.

## 2019-01-02 NOTE — Progress Notes (Signed)
Recreation Therapy Notes  Date: 01/02/2019  Time: 9:30 am   Location: Craft room   Behavioral response: N/A   Intervention Topic: Stress  Discussion/Intervention: Patient did not attend group.   Clinical Observations/Feedback:  Patient did not attend group.   Jenipher Havel LRT/CTRS        Areana Kosanke 01/02/2019 11:23 AM 

## 2019-01-02 NOTE — [facility] Group Notes (Signed)
LCSW Group Therapy Note   01/02/2019 11:24 AM   Type of Therapy and Topic:  Group Therapy:  Overcoming Obstacles   Participation Level:  Did Not Attend   Description of Group:    In this group patients will be encouraged to explore what they see as obstacles to their own wellness and recovery. They will be guided to discuss their thoughts, feelings, and behaviors related to these obstacles. The group will process together ways to cope with barriers, with attention given to specific choices patients can make. Each patient will be challenged to identify changes they are motivated to make in order to overcome their obstacles. This group will be process-oriented, with patients participating in exploration of their own experiences as well as giving and receiving support and challenge from other group members.   Therapeutic Goals: 1. Patient will identify personal and current obstacles as they relate to admission. 2. Patient will identify barriers that currently interfere with their wellness or overcoming obstacles.  3. Patient will identify feelings, thought process and behaviors related to these barriers. 4. Patient will identify two changes they are willing to make to overcome these obstacles:      Summary of Patient Progress x     Therapeutic Modalities:   Cognitive Behavioral Therapy Solution Focused Therapy Motivational Interviewing Relapse Prevention Therapy  Mayzie Caughlin, MSW, LCSW Clinical Social Work 01/02/2019 11:24 AM   

## 2019-01-03 NOTE — [facility] Group Notes (Signed)
Feelings Around Diagnosis 01/03/2019 1PM  Type of Therapy/Topic:  Group Therapy:  Feelings about Diagnosis  Participation Level:  Did Not Attend   Description of Group:   This group will allow patients to explore their thoughts and feelings about diagnoses they have received. Patients will be guided to explore their level of understanding and acceptance of these diagnoses. Facilitator will encourage patients to process their thoughts and feelings about the reactions of others to their diagnosis and will guide patients in identifying ways to discuss their diagnosis with significant others in their lives. This group will be process-oriented, with patients participating in exploration of their own experiences, giving and receiving support, and processing challenge from other group members.   Therapeutic Goals: 1. Patient will demonstrate understanding of diagnosis as evidenced by identifying two or more symptoms of the disorder 2. Patient will be able to express two feelings regarding the diagnosis 3. Patient will demonstrate their ability to communicate their needs through discussion and/or role play  Summary of Patient Progress:       Therapeutic Modalities:   Cognitive Behavioral Therapy Brief Therapy Feelings Identification    Yvette Rack, LCSW 01/03/2019 2:09 PM

## 2019-01-03 NOTE — Plan of Care (Signed)
Pt could not be assessed. Pt did rate anxiety "15" on a 0-10 scale saying that he did have anxiety. Pt was educated on care plan. Collier Bullock RN Problem: Education: Goal: Freight forwarder Education information/materials will improve Outcome: Not Progressing Goal: Emotional status will improve Outcome: Not Progressing Goal: Mental status will improve Outcome: Not Progressing Goal: Verbalization of understanding the information provided will improve Outcome: Not Progressing   Problem: Safety: Goal: Periods of time without injury will increase Outcome: Not Progressing   Problem: Education: Goal: Will be free of psychotic symptoms Outcome: Not Progressing Goal: Knowledge of the prescribed therapeutic regimen will improve Outcome: Not Progressing   Problem: Coping: Goal: Coping ability will improve Outcome: Not Progressing Goal: Will verbalize feelings Outcome: Not Progressing   Problem: Health Behavior/Discharge Planning: Goal: Compliance with prescribed medication regimen will improve Outcome: Not Progressing

## 2019-01-03 NOTE — Progress Notes (Signed)
[facility]  01/03/2019 11:53 AM Ashok CroonMaurice D Colebank  MRN:  [id number] Subjective:   I am saving lives and protecting people from coronavirus 19.  I am also working on selling involuntary bankrupt for [facility].  So if he works for: Hospital you will be getting a raise of 125%.  We are redistributing the day to make sure our employees are on top.  I am also on the [employer] spokesman for coronavirus and our main focus is on air and water quality.  I have given 1 day in [place] to [place] education and $1 billion to feed [place] annually.  I fell a few days ago.  And this is my second concussion and proper follow-up protocol was not followed, I will supposed to be a [facility].  I am going to sue the hospital even they will all have for the hospital.  I also had a reaction to lithium in the past which is why I am refusing to take it.  Objective: Patient is seen and examined.  Patient is a 68 year old male with the above-stated past psychiatric history who is seen in follow-up.  As noted above patient continues to present with a significant amount of psychosis, delusional, and grandiosity.  He is however alert and oriented to name, time and place.  He was able to successfully identify the new president a late gel Biden, and then later stated he is on the Counselling psychologistsecretary cabinet for Mr. Jackquline BoschBiden.  He also identified: [facility] as his place and location in [place].  He continues to state that he is on a Dispensing opticiansecretary board for [facility]osey Boy[facility]ighton, [facility], and reference himself as Dr. Amada Jupiterale.  He does not recall his fall and obtaining a CT scan and is requesting transfer to [facility] for ongoing work-up of the fall as well as concussion protocol.  He is observed writing down a notice to [facility], and became guarded and restricted when writer asked to read the letter.  He denies any active hallucinations any active psychosis, any suicidal thoughts, homicidal thoughts.  He does not  appear to be responding to internal stimuli however he continues to endorse a great amount of delusions.  He is taking some of his medication however has denied other medication to include lithium and ECT therapy.  We will continue to encourage patient to take ECT as he has noted that he is not had a reaction to lithium.  Principal Problem: Bipolar I disorder, current or most recent episode manic, with psychotic features (HCC) Diagnosis: Principal Problem:   Bipolar I disorder, current or most recent episode manic, with psychotic features (HCC) Active Problems:   Affective psychosis, bipolar (HCC)   Bipolar disorder (HCC)  Total Time spent with patient: 20 minutes  Past Psychiatric History: See admission H&P  Past Medical History:  Past Medical History:  Diagnosis Date  . Bipolar disorder (HCC)   . Coronary artery disease   . GERD (gastroesophageal reflux disease)   . History of multiple strokes   . PTSD (post-traumatic stress disorder)     Past Surgical History:  Procedure Laterality Date  . CARDIAC CATHETERIZATION     Family History: History reviewed. No pertinent family history. Family Psychiatric  History: See admission H&P Social History:  Social History   Substance and Sexual Activity  Alcohol Use No     Social History   Substance and Sexual Activity  Drug Use Never    Social History  Socioeconomic History  . Marital status: Legally Separated    Spouse name: Not on file  . Number of children: Not on file  . Years of education: Not on file  . Highest education level: Not on file  Occupational History  . Not on file  Social Needs  . Financial resource strain: Not on file  . Food insecurity    Worry: Not on file    Inability: Not on file  . Transportation needs    Medical: Not on file    Non-medical: Not on file  Tobacco Use  . Smoking status: Never Smoker  . Smokeless tobacco: Never Used  Substance and Sexual Activity  . Alcohol use: No  . Drug use:  Never  . Sexual activity: Not on file  Lifestyle  . Physical activity    Days per week: Not on file    Minutes per session: Not on file  . Stress: Not on file  Relationships  . Social Musician on phone: Not on file    Gets together: Not on file    Attends religious service: Not on file    Active member of club or organization: Not on file    Attends meetings of clubs or organizations: Not on file    Relationship status: Not on file  Other Topics Concern  . Not on file  Social History Narrative  . Not on file   Additional Social History:    Pain Medications: see PTA Prescriptions: see PTA Over the Counter: see PTA History of alcohol / drug use?: No history of alcohol / drug abuse       Sleep: Good  Appetite:  Good  Current Medications: Current Facility-Administered Medications  Medication Dose Route Frequency Provider Last Rate Last Dose  . acetaminophen (TYLENOL) tablet 650 mg  650 mg Oral Q6H PRN Malvin Johns, MD   650 mg at 01/02/19 1217  . alum & mag hydroxide-simeth (MAALOX/MYLANTA) 200-200-20 MG/5ML suspension 30 mL  30 mL Oral Q4H PRN Malvin Johns, MD      . clopidogrel (PLAVIX) tablet 75 mg  75 mg Oral Daily Malvin Johns, MD   75 mg at 01/03/19 0824  . divalproex (DEPAKOTE) DR tablet 1,000 mg  1,000 mg Oral QPM Paliy, Alisa, MD   1,000 mg at 01/02/19 1714  . hydrOXYzine (ATARAX/VISTARIL) tablet 50 mg  50 mg Oral TID PRN Malvin Johns, MD   50 mg at 12/26/18 2222  . lithium carbonate capsule 300 mg  300 mg Oral BID WC Clapacs, John T, MD   300 mg at 01/02/19 1610  . LORazepam (ATIVAN) injection 2 mg  2 mg Intramuscular Q6H PRN Clapacs, Jackquline Denmark, MD   2 mg at 01/02/19 1610  . OLANZapine zydis (ZYPREXA) disintegrating tablet 10 mg  10 mg Oral Q8H PRN Antonieta Pert, MD       And  . LORazepam (ATIVAN) tablet 1 mg  1 mg Oral PRN Antonieta Pert, MD       And  . ziprasidone (GEODON) injection 20 mg  20 mg Intramuscular PRN Antonieta Pert, MD      .  magnesium hydroxide (MILK OF MAGNESIA) suspension 30 mL  30 mL Oral Daily PRN Malvin Johns, MD      . metoprolol succinate (TOPROL-XL) 24 hr tablet 50 mg  50 mg Oral Daily Malvin Johns, MD   50 mg at 01/03/19 4696  . QUEtiapine (SEROQUEL) tablet 600 mg  600 mg Oral QHS  Antonieta Pert, MD   600 mg at 01/02/19 2112  . tamsulosin (FLOMAX) capsule 0.4 mg  0.4 mg Oral QPC supper Antonieta Pert, MD   0.4 mg at 01/02/19 1714    Lab Results: No results found for this or any previous visit (from the past 48 hour(s)).  Blood Alcohol level:  Lab Results  Component Value Date   ETH <10 12/16/2018   ETH <10 10/29/2017    Metabolic Disorder Labs: Lab Results  Component Value Date   HGBA1C 5.8 (H) 12/25/2018   MPG 119.76 12/25/2018   MPG 111 11/03/2017   No results found for: PROLACTIN Lab Results  Component Value Date   CHOL 133 11/03/2017   TRIG 148 11/03/2017   HDL 44 11/03/2017   CHOLHDL 3.0 11/03/2017   VLDL 30 11/03/2017   LDLCALC 59 11/03/2017   LDLCALC 39 05/10/2015    Physical Findings: AIMS:  , ,  ,  ,    CIWA:    COWS:     Musculoskeletal: Strength & Muscle Tone: within normal limits Gait & Station: normal Patient leans: N/A  Psychiatric Specialty Exam: Physical Exam  Constitutional: He appears well-developed and well-nourished.  HENT:  Head: Normocephalic.    ROS   Blood pressure (!) 144/95, pulse (!) 109, temperature 97.8 F (36.6 C), temperature source Oral, resp. rate 18, height 5\' 10"  (1.778 m), weight 76 kg, SpO2 99 %.Body mass index is 24.04 kg/m.  General Appearance: Guarded  Eye Contact:  Good  Speech:  Clear and Coherent and Pressured  Volume:  Normal  Mood:  Anxious and Irritable  Affect:  Appropriate  Thought Process:  Irrelevant, Linear and Descriptions of Associations: Tangential  Orientation:  Full (Time, Place, and Person)  Thought Content:  Delusions, Paranoid Ideation, Rumination and Tangential  Suicidal Thoughts:  No  Homicidal  Thoughts:  No  Memory:  Immediate;   Fair Recent;   Fair Remote;   Fair  Judgement:  Impaired  Insight:  Lacking  Psychomotor Activity:  Increased  Concentration:  Concentration: Fair and Attention Span: Fair  Recall:  of Knowledge:  Fair  Language:  Good  Akathisia:  Negative  Handed:  Right  AIMS (if indicated):     Assets:  Desire for Improvement Resilience  ADL's:  Impaired  Cognition:  WNL  Sleep:  Number of Hours: 9     Treatment Plan Summary: Plans have been reviewed and there will be no new changes to current medications or plan.  Daily contact with patient to assess and evaluate symptoms and progress in treatment, Medication management and Plan : Patient is seen and examined.  Patient is a 68 year old man with the above-stated past psychiatric history who is seen in follow-up.   Diagnosis: #1 bipolar disorder; most recently manic with psychotic features versus schizoaffective disorder; bipolar type, #2 hypertension, #3 coronary artery disease  Patient is seen in follow-up.  He is essentially unchanged.   1.    Stop Cogentin 2.   Continue Depakote 1000mg  po QHS. WIll need to repeat Depakote level on Thursday. Continue to encourage patient to take Lithium as prescribed.  3.  Continue Plavix 75 mg p.o. daily for chronic anticoagulation. 4.  Continue metoprolol XL 50 mg p.o. daily for hypertension and heart disease. 5.  Continue agitation protocol as written. 6.  continue Seroquel to 600 mg p.o. nightly for mood stability and psychosis. 7.  Continue Flomax 0.4 mg p.o. daily for benign prostatic hypertrophy. 8.  Disposition planning-in progress.  Suella Broad, FNP 01/03/2019, 11:53 AM

## 2019-01-03 NOTE — Tx Team (Signed)
Interdisciplinary Treatment and Diagnostic Plan Update  01/03/2019 Time of Session: 830am Austin Rhodes MRN: [id number]  Principal Diagnosis: Bipolar I disorder, current or most recent episode manic, with psychotic features ([place])  Secondary Diagnoses: Principal Problem:   Bipolar I disorder, current or most recent episode manic, with psychotic features ([place]) Active Problems:   Affective psychosis, bipolar ([place])   Bipolar disorder ([place])   Current Medications:  Current Facility-Administered Medications  Medication Dose Route Frequency Provider Last Rate Last Dose  . acetaminophen (TYLENOL) tablet 650 mg  650 mg Oral Q6H PRN Johnn Hai, MD   650 mg at 01/02/19 1217  . alum & mag hydroxide-simeth (MAALOX/MYLANTA) 200-200-20 MG/5ML suspension 30 mL  30 mL Oral Q4H PRN Johnn Hai, MD      . clopidogrel (PLAVIX) tablet 75 mg  75 mg Oral Daily Johnn Hai, MD   75 mg at 01/03/19 0824  . divalproex (DEPAKOTE) DR tablet 1,000 mg  1,000 mg Oral QPM Paliy, Alisa, MD   1,000 mg at 01/02/19 1714  . hydrOXYzine (ATARAX/VISTARIL) tablet 50 mg  50 mg Oral TID PRN Johnn Hai, MD   50 mg at 12/26/18 2222  . lithium carbonate capsule 300 mg  300 mg Oral BID WC Clapacs, John T, MD   300 mg at 01/02/19 1610  . LORazepam (ATIVAN) injection 2 mg  2 mg Intramuscular Q6H PRN Clapacs, Madie Reno, MD   2 mg at 01/02/19 1610  . OLANZapine zydis (ZYPREXA) disintegrating tablet 10 mg  10 mg Oral Q8H PRN Sharma Covert, MD       And  . LORazepam (ATIVAN) tablet 1 mg  1 mg Oral PRN Sharma Covert, MD       And  . ziprasidone (GEODON) injection 20 mg  20 mg Intramuscular PRN Sharma Covert, MD      . magnesium hydroxide (MILK OF MAGNESIA) suspension 30 mL  30 mL Oral Daily PRN Johnn Hai, MD      . metoprolol succinate (TOPROL-XL) 24 hr tablet 50 mg  50 mg Oral Daily Johnn Hai, MD   50 mg at 01/03/19 7412  . QUEtiapine (SEROQUEL) tablet 600 mg  600 mg Oral QHS Sharma Covert, MD   600 mg at  01/02/19 2112  . tamsulosin (FLOMAX) capsule 0.4 mg  0.4 mg Oral QPC supper Sharma Covert, MD   0.4 mg at 01/02/19 1714   PTA Medications: Medications Prior to Admission  Medication Sig Dispense Refill Last Dose  . acetaminophen (TYLENOL) 325 MG tablet Take 2 tablets (650 mg total) by mouth every 6 (six) hours as needed for mild pain or moderate pain. 30 tablet 0   . alum & mag hydroxide-simeth (MAALOX/MYLANTA) 200-200-20 MG/5ML suspension Take 30 mLs by mouth every 4 (four) hours as needed for indigestion. 355 mL 0   . B Complex-Biotin-FA (B COMPLETE PO) Take 1 tablet by mouth daily.     . benztropine (COGENTIN) 1 MG tablet Take 1 tablet (1 mg total) by mouth 2 (two) times daily.     . clopidogrel (PLAVIX) 75 MG tablet Take 1 tablet (75 mg total) by mouth daily. 30 tablet 3   . divalproex (DEPAKOTE) 250 MG DR tablet Take 250 mg by mouth 2 (two) times daily.     Marland Kitchen lamoTRIgine (LAMICTAL) 200 MG tablet Take 1 tablet (200 mg total) by mouth daily. 30 tablet 1   . magnesium hydroxide (MILK OF MAGNESIA) 400 MG/5ML suspension Take 30 mLs by mouth daily  as needed for mild constipation. 355 mL 0   . metoprolol succinate (TOPROL-XL) 25 MG 24 hr tablet Take 1 tablet (25 mg total) by mouth daily. 30 tablet 1   . Multiple Vitamin (MULTIVITAMIN WITH MINERALS) TABS tablet Take 1 tablet by mouth daily.     Marland Kitchen perphenazine (TRILAFON) 4 MG tablet Take 1 tablet (4 mg total) by mouth 3 (three) times daily.     . QUEtiapine (SEROQUEL XR) 200 MG 24 hr tablet Take 200 mg by mouth at bedtime.     . rosuvastatin (CRESTOR) 10 MG tablet Take 1 tablet (10 mg total) by mouth daily. 30 tablet 1   . thiamine (B-1) 100 MG/ML injection Inject 1 mL (100 mg total) into the vein daily. 25 mL      Patient Stressors: Health problems Medication change or noncompliance  Patient Strengths: Curator fund of knowledge Supportive family/friends  Treatment Modalities: Medication Management, Group therapy,  Case management,  1 to 1 session with clinician, Psychoeducation, Recreational therapy.   Physician Treatment Plan for Primary Diagnosis: Bipolar I disorder, current or most recent episode manic, with psychotic features ([place]) Long Term Goal(s):     Short Term Goals:    Medication Management: Evaluate patient's response, side effects, and tolerance of medication regimen.  Therapeutic Interventions: 1 to 1 sessions, Unit Group sessions and Medication administration.  Evaluation of Outcomes: Not Met  Physician Treatment Plan for Secondary Diagnosis: Principal Problem:   Bipolar I disorder, current or most recent episode manic, with psychotic features ([place]) Active Problems:   Affective psychosis, bipolar ([place])   Bipolar disorder ([place])  Long Term Goal(s):     Short Term Goals:       Medication Management: Evaluate patient's response, side effects, and tolerance of medication regimen.  Therapeutic Interventions: 1 to 1 sessions, Unit Group sessions and Medication administration.  Evaluation of Outcomes: Not Met   RN Treatment Plan for Primary Diagnosis: Bipolar I disorder, current or most recent episode manic, with psychotic features ([place]) Long Term Goal(s): Knowledge of disease and therapeutic regimen to maintain health will improve  Short Term Goals: Ability to remain free from injury will improve, Ability to demonstrate self-control, Ability to participate in decision making will improve and Compliance with prescribed medications will improve  Medication Management: RN will administer medications as ordered by provider, will assess and evaluate patient's response and provide education to patient for prescribed medication. RN will report any adverse and/or side effects to prescribing provider.  Therapeutic Interventions: 1 on 1 counseling sessions, Psychoeducation, Medication administration, Evaluate responses to treatment, Monitor vital signs and CBGs as ordered, Perform/monitor  CIWA, COWS, AIMS and Fall Risk screenings as ordered, Perform wound care treatments as ordered.  Evaluation of Outcomes: Not Met   LCSW Treatment Plan for Primary Diagnosis: Bipolar I disorder, current or most recent episode manic, with psychotic features ([place]) Long Term Goal(s): Safe transition to appropriate next level of care at discharge, Engage patient in therapeutic group addressing interpersonal concerns.  Short Term Goals: Engage patient in aftercare planning with referrals and resources, Facilitate acceptance of mental health diagnosis and concerns, Identify triggers associated with mental health/substance abuse issues and Increase skills for wellness and recovery  Therapeutic Interventions: Assess for all discharge needs, 1 to 1 time with Social worker, Explore available resources and support systems, Assess for adequacy in community support network, Educate family and significant other(s) on suicide prevention, Complete Psychosocial Assessment, Interpersonal group therapy.  Evaluation of Outcomes: Not Met  Progress in Treatment: Attending groups: No. Participating in groups: No. Taking medication as prescribed: No. Toleration medication: Yes. Family/Significant other contact made: Yes, individual(s) contacted:  Tyler Aas, daughter Patient understands diagnosis: No. Discussing patient identified problems/goals with staff: Yes. Medical problems stabilized or resolved: Yes. Denies suicidal/homicidal ideation: Yes. Issues/concerns per patient self-inventory: No. Other: N/A  New problem(s) identified: No, Describe:  none  New Short Term/Long Term Goal(s): Detox, elimination of AVH/symptoms of psychosis, medication management for mood stabilization; elimination of SI thoughts; development of comprehensive mental wellness/sobriety plan.   Patient Goals:  "Put Trump in jail"  Discharge Plan or Barriers: SPE pamphlet, Mobile Crisis information, and AA/NA information  provided to patient for additional community support and resources. CSW assessing for appropriate referrals. Update 12/29/18-pt still displaying delusions and manic behavior. Discharge plan TBD. Update 01/03/19-pt still displaying delusions and manic behavior. Pt has been refusing some of his medication. Discharge plan TBD  Reason for Continuation of Hospitalization: Delusions  Mania Medication stabilization  Estimated Length of Stay: TBD  Recreational Therapy: Patient Stressors: N/A  Patient Goal: Patient will focus on task/topic with 2 prompts from staff within 5 recreation therapy group  Sessions Attendees: Patient: 12/24/2018 9:14 AM  Physician: Alethia Berthold 12/24/2018 9:14 AM  Nursing: Polly Cobia 12/24/2018 9:14 AM  RN Care Manager: 12/24/2018 9:14 AM  Social Worker: Sanjuana Kava [place] 12/24/2018 9:14 AM  Recreational Therapist:  12/24/2018 9:14 AM  Other:  12/24/2018 9:14 AM  Other:  12/24/2018 9:14 AM  Other: 12/24/2018 9:14 AM        Scribe for Treatment Team: Yvette Rack, LCSW 01/03/2019 11:30 AM

## 2019-01-03 NOTE — Progress Notes (Signed)
Pt has been calmer today although he has refused his lithium. Pt has been mainly isolative in his room writing his plans of grandiose ideas. Collier Bullock RN

## 2019-01-03 NOTE — Progress Notes (Signed)
D: Patient has been sleeping most of shift. Upon awakening, tells me the plan is to get Austin Rhodes. Also says that he has been talked to about the possibility of doing "crystal therapy". Upon further questioning, patient said her as referring to ECT. Continues to be psychotic, talking about various plans to take down government officials. Denies SI, HI and AVH. A: continue to monitor for safety R: Safety maintained

## 2019-01-03 NOTE — Progress Notes (Signed)
Recreation Therapy Notes  Date: 01/03/2019  Time: 9:30 am   Location: Craft room   Behavioral response: N/A   Intervention Topic: Strengths   Discussion/Intervention: Patient did not attend group.   Clinical Observations/Feedback:  Patient did not attend group.   Yazlyn Wentzel LRT/CTRS        Ellissa Ayo 01/03/2019 11:09 AM

## 2019-01-03 NOTE — Plan of Care (Signed)
  Problem: Education: Goal: Knowledge of [place] [facility] Education information/materials will improve Outcome: Not Progressing Goal: Emotional status will improve Outcome: Not Progressing Goal: Mental status will improve Outcome: Not Progressing Goal: Verbalization of understanding the information provided will improve Outcome: Not Progressing  D: Patient has been sleeping most of shift. Upon awakening, tells me the plan is to get Rico Junker. Also says that he has been talked to about the possibility of doing "crystal therapy". Upon further questioning, patient said her as referring to ECT. Continues to be psychotic, talking about various plans to take down government officials. Denies SI, HI and AVH. A: continue to monitor for safety R: Safety maintained

## 2019-01-04 LAB — LYME DISEASE, WESTERN BLOT
IgG P18 Ab.: ABSENT
IgG P23 Ab.: ABSENT
IgG P28 Ab.: ABSENT
IgG P30 Ab.: ABSENT
IgG P39 Ab.: ABSENT
IgG P41 Ab.: ABSENT
IgG P45 Ab.: ABSENT
IgG P58 Ab.: ABSENT
IgG P66 Ab.: ABSENT
IgG P93 Ab.: ABSENT
IgM P23 Ab.: ABSENT
IgM P39 Ab.: ABSENT
IgM P41 Ab.: ABSENT
Lyme IgG Wb: NEGATIVE
Lyme IgM Wb: NEGATIVE

## 2019-01-04 MED ORDER — LAMOTRIGINE 25 MG PO TABS
25.0000 mg | ORAL_TABLET | Freq: Every day | ORAL | Status: DC
  Administered 2019-01-04 – 2019-01-06 (×3): 25 mg via ORAL
  Filled 2019-01-04 (×3): qty 1

## 2019-01-04 NOTE — Progress Notes (Signed)
D: Patient has shown no improvement at all in his psychotic symptoms. Daughter visited and stated that he is not delusional at baseline and that she thinks he became psychotic because he did not take his Depakote for two days. She believes he is showing no improvement because we do not have him on the same medication that his outpatient provider had him on. She was asking to have her father transferred to [facility] and says she does not want to pursue ECT. Instructed her to address her concerns with Dr. Weber Cooks. Patient was noticeable more agitated after his daughter left, up several times during the night, insistent that the sheriff was coming to pick him up and take him to some high-ranking government official's home. At one time, claiming a helicopter was coming to take him out of here. He also asked me to request a certain song at the radio station and turn his radio onto a country music station. When told he was not allowed to have a radio, he said it was in his room and pointed to his shelf and told me to stand under the speaker so I could hear it. He claimed he could hear country music playing in his room and asked me to turn it up on the way out. Continuing to pass notes and persistently delusional that he is leaving. A: Continue to monitor for safety R: Safety maintained.

## 2019-01-04 NOTE — Progress Notes (Signed)
Recreation Therapy Notes  Date: 01/04/2019  Time: 9:30 am  Location: Craft room  Behavioral response: Appropriate   Intervention Topic: Coping Skills  Discussion/Intervention:   Group content on today was focused on coping skills. The group defined what coping skills are and when they can be used. Individuals described how they normally cope with thing and the coping skills they normally use. Patients expressed why it is important to cope with things and how not coping with things can affect you. The group participated in the intervention "My coping box" and made coping boxes while adding coping skills they could use in the future to the box. Clinical Observations/Feedback:  Patient came to group late due to unknown reasons. Individual left group early and never returned.  Milanna Kozlov LRT/CTRS         Sami Froh 01/04/2019 11:28 AM

## 2019-01-04 NOTE — Progress Notes (Signed)
[facility] MD Progress Note  01/04/2019 3:51 PM Austin Rhodes  MRN:  [id number]   Subjective: Has follow-up for patient diagnosed with bipolar 1 disorder current episode manic.  Patient continues to be severely delusional today.  He reports that he is supposed to be meeting going to [employer] at his house for dinner as well as Gilmore Laroche and they are flying in by helicopter and are going to land in his backyard.  He is demanding to leave and that we must make multiple phone calls because he is a Youth worker informant for the FBI and we are impeding with an FBI investigation and he needs to be discharged immediately.  He does deny having any suicidal homicidal ideations and denies that he is having any hallucinations.  Principal Problem: Bipolar I disorder, current or most recent episode manic, with psychotic features ([place]) Diagnosis: Principal Problem:   Bipolar I disorder, current or most recent episode manic, with psychotic features ([place]) Active Problems:   Affective psychosis, bipolar ([place])   Bipolar disorder ([place])  Total Time spent with patient: 30 minutes  Past Psychiatric History: See Previous  Past Medical History:  Past Medical History:  Diagnosis Date  . Bipolar disorder ([place])   . Coronary artery disease   . GERD (gastroesophageal reflux disease)   . History of multiple strokes   . PTSD (post-traumatic stress disorder)     Past Surgical History:  Procedure Laterality Date  . CARDIAC CATHETERIZATION     Family History: History reviewed. No pertinent family history. Family Psychiatric  History: See previous Social History:  Social History   Substance and Sexual Activity  Alcohol Use No     Social History   Substance and Sexual Activity  Drug Use Never    Social History   Socioeconomic History  . Marital status: Legally Separated    Spouse name: Not on file  . Number of children: Not on file  . Years of education: Not on file  . Highest education level:  Not on file  Occupational History  . Not on file  Social Needs  . Financial resource strain: Not on file  . Food insecurity    Worry: Not on file    Inability: Not on file  . Transportation needs    Medical: Not on file    Non-medical: Not on file  Tobacco Use  . Smoking status: Never Smoker  . Smokeless tobacco: Never Used  Substance and Sexual Activity  . Alcohol use: No  . Drug use: Never  . Sexual activity: Not on file  Lifestyle  . Physical activity    Days per week: Not on file    Minutes per session: Not on file  . Stress: Not on file  Relationships  . Social Herbalist on phone: Not on file    Gets together: Not on file    Attends religious service: Not on file    Active member of club or organization: Not on file    Attends meetings of clubs or organizations: Not on file    Relationship status: Not on file  Other Topics Concern  . Not on file  Social History Narrative  . Not on file   Additional Social History:    Pain Medications: see PTA Prescriptions: see PTA Over the Counter: see PTA History of alcohol / drug use?: No history of alcohol / drug abuse  Sleep: Fair  Appetite:  Fair  Current Medications: Current Facility-Administered Medications  Medication Dose Route Frequency Provider Last Rate Last Dose  . acetaminophen (TYLENOL) tablet 650 mg  650 mg Oral Q6H PRN Malvin Johns, MD   650 mg at 01/04/19 0541  . alum & mag hydroxide-simeth (MAALOX/MYLANTA) 200-200-20 MG/5ML suspension 30 mL  30 mL Oral Q4H PRN Malvin Johns, MD      . clopidogrel (PLAVIX) tablet 75 mg  75 mg Oral Daily Malvin Johns, MD   75 mg at 01/04/19 3474  . divalproex (DEPAKOTE) DR tablet 1,000 mg  1,000 mg Oral QPM Paliy, Alisa, MD   1,000 mg at 01/03/19 1737  . hydrOXYzine (ATARAX/VISTARIL) tablet 50 mg  50 mg Oral TID PRN Malvin Johns, MD   50 mg at 12/26/18 2222  . lamoTRIgine (LAMICTAL) tablet 25 mg  25 mg Oral Daily Clapacs, Jackquline [place], MD    25 mg at 01/04/19 1332  . lithium carbonate capsule 300 mg  300 mg Oral BID WC Clapacs, John T, MD   300 mg at 01/02/19 1610  . LORazepam (ATIVAN) injection 2 mg  2 mg Intramuscular Q6H PRN Clapacs, Jackquline [place], MD   2 mg at 01/02/19 1610  . magnesium hydroxide (MILK OF MAGNESIA) suspension 30 mL  30 mL Oral Daily PRN Malvin Johns, MD      . metoprolol succinate (TOPROL-XL) 24 hr tablet 50 mg  50 mg Oral Daily Malvin Johns, MD   50 mg at 01/04/19 2595  . OLANZapine zydis (ZYPREXA) disintegrating tablet 10 mg  10 mg Oral Q8H PRN Antonieta Pert, MD   10 mg at 01/04/19 6387   And  . ziprasidone (GEODON) injection 20 mg  20 mg Intramuscular PRN Antonieta Pert, MD      . QUEtiapine (SEROQUEL) tablet 600 mg  600 mg Oral QHS Antonieta Pert, MD   600 mg at 01/03/19 2027  . tamsulosin (FLOMAX) capsule 0.4 mg  0.4 mg Oral QPC supper Antonieta Pert, MD   0.4 mg at 01/02/19 1714    Lab Results: No results found for this or any previous visit (from the past 48 hour(s)).  Blood Alcohol level:  Lab Results  Component Value Date   ETH <10 12/16/2018   ETH <10 10/29/2017    Metabolic Disorder Labs: Lab Results  Component Value Date   HGBA1C 5.8 (H) 12/25/2018   MPG 119.76 12/25/2018   MPG 111 11/03/2017   No results found for: PROLACTIN Lab Results  Component Value Date   CHOL 133 11/03/2017   TRIG 148 11/03/2017   HDL 44 11/03/2017   CHOLHDL 3.0 11/03/2017   VLDL 30 11/03/2017   LDLCALC 59 11/03/2017   LDLCALC 39 05/10/2015    Physical Findings: AIMS:  , ,  ,  ,    CIWA:    COWS:     Musculoskeletal: Strength & Muscle Tone: within normal limits Gait & Station: normal Patient leans: N/A  Psychiatric Specialty Exam: Physical Exam  Nursing note and vitals reviewed. Constitutional: He appears well-developed and well-nourished.  Cardiovascular: Normal rate.  Respiratory: Effort normal.  Musculoskeletal: Normal range of motion.  Neurological: He is alert.  Skin: Skin is  warm.    Review of Systems  Constitutional: Negative.   HENT: Negative.   Eyes: Negative.   Respiratory: Negative.   Cardiovascular: Negative.   Gastrointestinal: Negative.   Genitourinary: Negative.   Musculoskeletal: Negative.   Skin: Negative.   Neurological: Negative.  Endo/Heme/Allergies: Negative.   Psychiatric/Behavioral: Negative.     Blood pressure 138/84, pulse 91, temperature 97.6 F (36.4 C), temperature source Oral, resp. rate 16, height 5\' 10"  (1.778 m), weight 76 kg, SpO2 100 %.Body mass index is 24.04 kg/m.  General Appearance: Guarded  Eye Contact:  Good  Speech:  Clear and Coherent and Pressured  Volume:  Normal  Mood:  Anxious and Irritable  Affect:  Congruent  Thought Process:  Irrelevant, Linear and Descriptions of Associations: Tangential  Orientation:  Full (Time, Place, and Person)  Thought Content:  Delusions, Paranoid Ideation, Rumination and Tangential  Suicidal Thoughts:  No  Homicidal Thoughts:  No  Memory:  Immediate;   Fair Recent;   Fair Remote;   Fair  Judgement:  Impaired  Insight:  Lacking  Psychomotor Activity:  Increased  Concentration:  Concentration: Fair  Recall:  [employer]Fair  Fund of Knowledge:  Fair  Language:  Fair  Akathisia:  No  Handed:  Right  AIMS (if indicated):     Assets:  Desire for Improvement Financial Resources/Insurance Resilience  ADL's:  Intact  Cognition:  WNL  Sleep:  Number of Hours: 4.5   Assessment: Patient presents in the hallway walking around the milieu.  Patient has been seen providing numerous notes to nursing staff with demands and stating that he needs to leave and that he is requesting phone numbers to the [address] and senators [place].  Patient has continued to be very delusional and demanding and becomes irritable at times.  He is also making threats of people being arrested and going to jail because he is still at the hospital.  Patient was also started on Lamictal 25 mg daily to assist  with maintaining his mood.  Treatment Plan Summary: Daily contact with patient to assess and evaluate symptoms and progress in treatment and Medication management Continue Depakote DR 1000 mg p.o. every evening for mood stability Continue Vistaril 50 mg p.o. 3 times daily as needed for anxiety Start Lamictal 25 mg p.o. daily Continue lithium carbonate 300 mg p.o. twice daily with meals for mood stability Continue Ativan 2 mg IM every 6 hours as needed for anxiety and sedation Continue agitation protocol of Zyprexa 10 mg, Ativan 1 mg, and Geodon 20 mg Continue Seroquel 600 mg p.o. nightly for bipolar 1 disorder Encourage group therapy participation Continue to reality therapy Continue every 15 minute safety checks  Maryfrances Bunnellravis B Heily Carlucci, FNP 01/04/2019, 3:51 PM

## 2019-01-04 NOTE — Progress Notes (Signed)
Patient ID: Austin Rhodes, male   DOB: 01/20/1951, 68 y.o.   MRN: [id number] Interim review of hospital course: Patient has been in the hospital now for over 2 weeks and continues to be highly symptomatic.  Family has expressed concern about his continued presentation of symptoms.  Because of this I am placing this note to briefly review his hospital course.  Patient was admitted to the hospital on December 19, 2018.  In brief, he presented with euphoric irritable hyperactive and grandiose mania.  Brief history was that the patient had a past history of bipolar disorder but had recently been off of his medication and had developed manic symptoms fairly quickly.  We understood that in the past he had been treated with a combination of Seroquel Depakote and lamotrigine and family reported that on these medicines he had been stable for an extended period of time.  Patient was worked up as usual and no additional medical or toxic reason for his condition was found.  Treatment from the beginning was with a combination of Depakote and Seroquel.  I did not restart the lamotrigine initially because this medicine is not indicated for treatment of acute mania and coadministration of lamotrigine and Depakote can increase risk of side effects because of their interaction.  Patient continued to be symptomatic while being compliant with medication.  Because of the persistent symptoms of mania the patient was also started on lithium with a low level dose of 300 mg twice a day.  It was not documented that he was showing any side effects of these medicines.  During the early morning of November 22 the patient suffered a fall in his room with a small amount of bleeding noticed on the back of his head.  CT scan was performed promptly and showed no bleed no fracture no acute change.  Depakote level was not checked at that particular time.  Patient was transferred to the intensive care unit overnight for observation but was then  transferred back to the psychiatric service promptly.  I was away from the office on leave during the following week and did not return until Monday, November 30.  Review of those notes shows that during that time there was some adjustment to his medicine.  Patient was tried on Trilafon briefly which did not appear to show any particular effectiveness but neither showed any particular side effects.  He was also treated with Tegretol for several days.  Throughout all of this it is documented that he continued to be extremely manic and psychotic to the point of it being almost impossible to communicate with him rationally.  Prior to my return he had been restarted on Depakote and Seroquel and doses were being titrated up appropriately.  In the last few days the patient has continued to show mania.  At times he is almost violently threatening but even when he is not he is loud and intrusive and grandiose with psychosis so thorough that I have really not ever had a clearly rational conversation with him.  Patient's sister who is his closest relative visited last night and asked for a review of his treatment.  I reviewed that he was currently on Depakote and Seroquel and explained my rationale for not having started lamotrigine.  Patient's sister feels strongly that we should try replicating medicines that were working for him during a sustained period of remission.  Today I have restarted lamotrigine but started at 25 mg a day.  This low dose is necessary  because he has been off the medicine for a long time and coadministration with Depakote increases the risk of elevated or even toxic lamotrigine levels.  I have reviewed more than once his medication list prior to hospitalization.  During that time he was on 200 mg of lamotrigine per day, 250 mg of Depakote per day and 200 mg of Seroquel at night.  I have explained that this could be a rational regimen for long-term maintenance treatment such as he was having but  that during an acute mania we frequently need to use larger doses or extra medicines for mood stability.  Today the patient is not complaining of tremor does not appear to be dizzy or unsteady on his feet.  I have been told that the patient's sister contacted "patient experience" and that they have made a request for transfer to a different hospital.  In his current condition I think it would be very difficult to transfer the patient because of how agitated psychotic and uncooperative he will he is but the treatment team can look into this.  Meanwhile I am documenting this to explain the rationale for the current treatment regimen.

## 2019-01-04 NOTE — Plan of Care (Signed)
Pt denies anxiety, depression, SI, HI and AVH. Pt was educated on care plan and verbalizes understanding. Collier Bullock RN Problem: Education: Goal: Freight forwarder Education information/materials will improve Outcome: Progressing Goal: Emotional status will improve Outcome: Not Progressing Goal: Mental status will improve Outcome: Not Progressing Goal: Verbalization of understanding the information provided will improve Outcome: Not Progressing   Problem: Safety: Goal: Periods of time without injury will increase Outcome: Progressing   Problem: Education: Goal: Will be free of psychotic symptoms Outcome: Not Progressing Goal: Knowledge of the prescribed therapeutic regimen will improve Outcome: Progressing   Problem: Coping: Goal: Coping ability will improve Outcome: Progressing   Problem: Health Behavior/Discharge Planning: Goal: Compliance with prescribed medication regimen will improve Outcome: Not Progressing

## 2019-01-04 NOTE — Plan of Care (Signed)
  Problem: Education: Goal: Will be free of psychotic symptoms Outcome: Not Progressing Goal: Knowledge of the prescribed therapeutic regimen will improve Outcome: Not Progressing  D: Patient has shown no improvement at all in his psychotic symptoms. Daughter visited and stated that he is not delusional at baseline and that she thinks he became psychotic because he did not take his Depakote for two days. She believes he is showing no improvement because we do not have him on the same medication that his outpatient provider had him on. She was asking to have her father transferred to [facility] and says she does not want to pursue ECT. Instructed her to address her concerns with Dr. Weber Cooks. Patient was noticeable more agitated after his daughter left, up several times during the night, insistent that the sheriff was coming to pick him up and take him to some high-ranking government official's home. At one time, claiming a helicopter was coming to take him out of here. He also asked me to request a certain song at the radio station and turn his radio onto a country music station. When told he was not allowed to have a radio, he said it was in his room and pointed to his shelf and told me to stand under the speaker so I could hear it. He claimed he could hear country music playing in his room and asked me to turn it up on the way out. Continuing to pass notes and persistently delusional that he is leaving. A: Continue to monitor for safety R: Safety maintained.

## 2019-01-04 NOTE — BHH Group Notes (Signed)
Emotional Regulation 01/04/2019 1PM  Type of Therapy/Topic:  Group Therapy:  Emotion Regulation  Participation Level:  Did Not Attend   Description of Group:   The purpose of this group is to assist patients in learning to regulate negative emotions and experience positive emotions. Patients will be guided to discuss ways in which they have been vulnerable to their negative emotions. These vulnerabilities will be juxtaposed with experiences of positive emotions or situations, and patients will be challenged to use positive emotions to combat negative ones. Special emphasis will be placed on coping with negative emotions in conflict situations, and patients will process healthy conflict resolution skills.  Therapeutic Goals: 1. Patient will identify two positive emotions or experiences to reflect on in order to balance out negative emotions 2. Patient will label two or more emotions that they find the most difficult to experience 3. Patient will demonstrate positive conflict resolution skills through discussion and/or role plays  Summary of Patient Progress:       Therapeutic Modalities:   Cognitive Behavioral Therapy Feelings Identification Dialectical Behavioral Therapy   Austin Rhodes Austin Lamiya Naas, LCSW 01/04/2019 2:16 PM  

## 2019-01-04 NOTE — Discharge Summary (Signed)
Physician Discharge Summary Note  Patient:  Austin Rhodes is an 68 y.o., male MRN:  [id number] DOB:  1950-06-18 Patient phone:  [phone] (home)  Patient address:   [address] [place] [address],  Total Time spent with patient: 15 minutes  Date of Admission:  12/18/2018 Date of Discharge: 12/25/2018  Reason for Admission: Patient had been admitted to the hospital due to psychotic mania  Principal Problem: Affective psychosis, bipolar (HCC) Discharge Diagnoses: Principal Problem:   Affective psychosis, bipolar (HCC) Active Problems:   HTN (hypertension)   Past Psychiatric History: History of past bipolar disorder with some extended periods of stability  Past Medical History:  Past Medical History:  Diagnosis Date  . Bipolar disorder (HCC)   . Coronary artery disease   . GERD (gastroesophageal reflux disease)   . History of multiple strokes   . PTSD (post-traumatic stress disorder)     Past Surgical History:  Procedure Laterality Date  . CARDIAC CATHETERIZATION     Family History: History reviewed. No pertinent family history. Family Psychiatric  History: See previous. Social History:  Social History   Substance and Sexual Activity  Alcohol Use No     Social History   Substance and Sexual Activity  Drug Use Never    Social History   Socioeconomic History  . Marital status: Legally Separated    Spouse name: Not on file  . Number of children: Not on file  . Years of education: Not on file  . Highest education level: Not on file  Occupational History  . Not on file  Social Needs  . Financial resource strain: Not on file  . Food insecurity    Worry: Not on file    Inability: Not on file  . Transportation needs    Medical: Not on file    Non-medical: Not on file  Tobacco Use  . Smoking status: Never Smoker  . Smokeless tobacco: Never Used  Substance and Sexual Activity  . Alcohol use: No  . Drug use: Never  . Sexual activity:  Not on file  Lifestyle  . Physical activity    Days per week: Not on file    Minutes per session: Not on file  . Stress: Not on file  Relationships  . Social Musician on phone: Not on file    Gets together: Not on file    Attends religious service: Not on file    Active member of club or organization: Not on file    Attends meetings of clubs or organizations: Not on file    Relationship status: Not on file  Other Topics Concern  . Not on file  Social History Narrative  . Not on file    Hospital Course: Patient was admitted to the psychiatric unit to be treated for bipolar disorder with acute psychotic mania.  Medications were administered including Seroquel and Depakote.  On the night in question the patient had a self-reported fall in the early morning hours.  Staff examined the patient and are reported to a found a small amount of blood on his head but that the patient did not seem to of lost consciousness.  Patient was referred to hospitalist and was to be transferred to the medical service for acute stabilization with a plan to return to psychiatry once stabilized.  CT scan was ordered and came back as unremarkable with no sign of fracture or bleed.  Physical Findings: AIMS: Facial and Oral Movements  Muscles of Facial Expression: None, normal Lips and Perioral Area: None, normal Jaw: None, normal Tongue: None, normal,Extremity Movements Upper (arms, wrists, hands, fingers): None, normal Lower (legs, knees, ankles, toes): None, normal, Trunk Movements Neck, shoulders, hips: None, normal, Overall Severity Severity of abnormal movements (highest score from questions above): None, normal Incapacitation due to abnormal movements: None, normal Patient's awareness of abnormal movements (rate only patient's report): No Awareness, Dental Status Current problems with teeth and/or dentures?: No Does patient usually wear dentures?: No  CIWA:    COWS:      Musculoskeletal: Strength & Muscle Tone: within normal limits Gait & Station: normal Patient leans: N/A  Psychiatric Specialty Exam: Physical Exam  ROS  Blood pressure (!) 145/105, pulse (!) 116, temperature 98.5 F (36.9 C), temperature source Oral, resp. rate 17, height 5\' 9"  (1.753 m), weight 79.4 kg, SpO2 97 %.Body mass index is 25.84 kg/m.  General Appearance: Casual  Eye Contact:  Fair  Speech:  Normal Rate  Volume:  Increased  Mood:  Euphoric  Affect:  Inappropriate  Thought Process:  Disorganized  Orientation:  Full (Time, Place, and Person)  Thought Content:  Illogical  Suicidal Thoughts:  No  Homicidal Thoughts:  No  Memory:  Negative  Judgement:  Negative  Insight:  Negative  Psychomotor Activity:  Negative  Concentration:  Concentration: Negative  Recall:  Negative  Fund of Knowledge:  Negative  Language:  Negative  Akathisia:  Negative  Handed:  Right  AIMS (if indicated):     Assets:  Social Support  ADL's:  Impaired  Cognition:  Impaired,  Mild  Sleep:  Number of Hours: 8     Have you used any form of tobacco in the last 30 days? (Cigarettes, Smokeless Tobacco, Cigars, and/or Pipes): No  Has this patient used any form of tobacco in the last 30 days? (Cigarettes, Smokeless Tobacco, Cigars, and/or Pipes) Yes, No  Blood Alcohol level:  Lab Results  Component Value Date   ETH <10 12/16/2018   ETH <10 25/95/6387    Metabolic Disorder Labs:  Lab Results  Component Value Date   HGBA1C 5.8 (H) 12/25/2018   MPG 119.76 12/25/2018   MPG 111 11/03/2017   No results found for: PROLACTIN Lab Results  Component Value Date   CHOL 133 11/03/2017   TRIG 148 11/03/2017   HDL 44 11/03/2017   CHOLHDL 3.0 11/03/2017   VLDL 30 11/03/2017   LDLCALC 59 11/03/2017   LDLCALC 39 05/10/2015    See Psychiatric Specialty Exam and Suicide Risk Assessment completed by Attending Physician prior to discharge.  Discharge destination:  Other:  Temporary transfer  to medical service  Is patient on multiple antipsychotic therapies at discharge:  No   Has Patient had three or more failed trials of antipsychotic monotherapy by history:  No  Recommended Plan for Multiple Antipsychotic Therapies: NA   Allergies as of 12/25/2018      Reactions   Procaine Other (See Comments)      Medication List    ASK your doctor about these medications     Indication  B COMPLETE PO Take 1 tablet by mouth daily.  Indication: general health   clopidogrel 75 MG tablet Commonly known as: Plavix Take 1 tablet (75 mg total) by mouth daily.  Indication: Stroke caused by a Blood Clot   divalproex 250 MG DR tablet Commonly known as: DEPAKOTE Take 250 mg by mouth 2 (two) times daily.    lamoTRIgine 200 MG tablet Commonly known  as: LAMICTAL Take 1 tablet (200 mg total) by mouth daily.  Indication: Manic-Depression   metoprolol succinate 25 MG 24 hr tablet Commonly known as: TOPROL-XL Take 1 tablet (25 mg total) by mouth daily.  Indication: High Blood Pressure Disorder   multivitamin with minerals Tabs tablet Take 1 tablet by mouth daily.  Indication: general health   QUEtiapine 200 MG 24 hr tablet Commonly known as: SEROQUEL XR Take 200 mg by mouth at bedtime.    rosuvastatin 10 MG tablet Commonly known as: CRESTOR Take 1 tablet (10 mg total) by mouth daily.  Indication: High Amount of Fats in the Blood        Follow-up recommendations:  Activity:  Activity only as appropriate on the medical service Diet:  Diet as ordered by hospitalist Other:  Patient may be followed by psychiatric consult service  Comments: I was not personally involved with the patient's transfer but in completing this discharge summary to complete the chart paperwork.  Signed: Mordecai RasmussenJohn , MD 01/04/2019, 5:22 PM

## 2019-01-04 NOTE — Progress Notes (Signed)
D: Patient has been markedly more intrusive and demanding this shift asking to be transferred various places, voicing multiple grandiose delusions and passing numerous notes to staff of instructions to be carried out. Becomes agitated if presented with reality. Daughter visited last evening asking this Pryor Curia about how to get the patient transferred. A: Continue to monitor for safety R: Safety maintained

## 2019-01-05 LAB — LITHIUM LEVEL: Lithium Lvl: <0.06 mmol/L — ABNORMAL LOW (ref 0.60–1.20)

## 2019-01-05 LAB — VALPROIC ACID LEVEL: Valproic Acid Lvl: 63 ug/mL (ref 50.0–100.0)

## 2019-01-05 NOTE — Plan of Care (Signed)
Patient continues to present with delusions.   Problem: Education: Goal: Emotional status will improve Outcome: Not Progressing Goal: Mental status will improve Outcome: Not Progressing

## 2019-01-05 NOTE — Plan of Care (Signed)
Pt denies depression, anxiety, SI, HI and AVH. Pt was educated on care plan and verbalizes understanding. Collier Bullock RN Problem: Education: Goal: Freight forwarder Education information/materials will improve Outcome: Progressing Goal: Emotional status will improve Outcome: Progressing Goal: Mental status will improve Outcome: Not Progressing Goal: Verbalization of understanding the information provided will improve Outcome: Not Progressing   Problem: Safety: Goal: Periods of time without injury will increase Outcome: Progressing   Problem: Education: Goal: Will be free of psychotic symptoms Outcome: Not Progressing Goal: Knowledge of the prescribed therapeutic regimen will improve Outcome: Progressing   Problem: Coping: Goal: Coping ability will improve Outcome: Progressing Goal: Will verbalize feelings Outcome: Progressing   Problem: Health Behavior/Discharge Planning: Goal: Compliance with prescribed medication regimen will improve Outcome: Progressing

## 2019-01-05 NOTE — Plan of Care (Signed)
Patient still presents delusional   Problem: Education: Goal: Emotional status will improve Outcome: Not Progressing Goal: Mental status will improve Outcome: Not Progressing

## 2019-01-05 NOTE — [facility] Group Notes (Signed)
LCSW Group Therapy Note  01/05/2019 1:59 PM  Type of Therapy/Topic:  Group Therapy:  Balance in Life  Participation Level:  Did Not Attend  Description of Group:    This group will address the concept of balance and how it feels and looks when one is unbalanced. Patients will be encouraged to process areas in their lives that are out of balance and identify reasons for remaining unbalanced. Facilitators will guide patients in utilizing problem-solving interventions to address and correct the stressor making their life unbalanced. Understanding and applying boundaries will be explored and addressed for obtaining and maintaining a balanced life. Patients will be encouraged to explore ways to assertively make their unbalanced needs known to significant others in their lives, using other group members and facilitator for support and feedback.  Therapeutic Goals: 1. Patient will identify two or more emotions or situations they have that consume much of in their lives. 2. Patient will identify signs/triggers that life has become out of balance:  3. Patient will identify two ways to set boundaries in order to achieve balance in their lives:  4. Patient will demonstrate ability to communicate their needs through discussion and/or role plays  Summary of Patient Progress: x     Therapeutic Modalities:   Cognitive Behavioral Therapy Solution-Focused Therapy Assertiveness Training  Evalina Field, MSW, LCSW Clinical Social Work 01/05/2019 1:59 PM

## 2019-01-05 NOTE — Progress Notes (Signed)
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 Patient denies SI/HI/AVH, pain, anxiety and depression with this Probation officer. Patient compliant with medication administration per MD orders. Patient given education. Patient given support and encouragement to be active in his treatment plan. Patient being monitored Q 15 minutes for safety per unit protocol. Patient remains safe on the unit.

## 2019-01-05 NOTE — Progress Notes (Signed)
[facility] MD Progress Note  01/05/2019 1:02 PM Austin Rhodes  MRN:  [id number] Subjective: Follow-up for this gentleman with bipolar mania.  Spoke with him this morning for quite a while and I must say it was the most lucid conversation we have ever had.  On the other hand he is definitely still psychotic.  He was able however to sit down and talk at a reasonably normal rate for most of the conversation and was able to hold a conversation about actual real things that were going on around [place] on the unit.  Eventually he got off on a tangent talking about various lawsuits that he has been involved in over the years.  He asked me to google them and I found that in fact he has been involved in some lawsuits.  It was a little hard to tell how much of his conversation was reality based or not.  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  He has continued to refuse the lithium but seems to be getting a little better with the current Seroquel and Depakote. Principal Problem: Bipolar I disorder, current or most recent episode manic, with psychotic features (HCC) Diagnosis: Principal Problem:   Bipolar I disorder, current or most recent episode manic, with psychotic features (HCC) Active Problems:   Affective psychosis, bipolar (HCC)   Bipolar disorder (HCC)  Total Time spent with patient: 30 minutes  Past Psychiatric History: Patient has a history of bipolar disorder with extended periods of stability in the past  Past Medical History:  Past Medical History:  Diagnosis Date  . Bipolar disorder (HCC)   . Coronary artery disease   . GERD (gastroesophageal reflux disease)   . History of multiple strokes   . PTSD (post-traumatic stress disorder)     Past Surgical History:  Procedure Laterality Date  . CARDIAC CATHETERIZATION     Family History: History reviewed. No pertinent family  history. Family Psychiatric  History: See previous Social History:  Social History   Substance and Sexual Activity  Alcohol Use No     Social History   Substance and Sexual Activity  Drug Use Never    Social History   Socioeconomic History  . Marital status: Legally Separated    Spouse name: Not on file  . Number of children: Not on file  . Years of education: Not on file  . Highest education level: Not on file  Occupational History  . Not on file  Social Needs  . Financial resource strain: Not on file  . Food insecurity    Worry: Not on file    Inability: Not on file  . Transportation needs    Medical: Not on file    Non-medical: Not on file  Tobacco Use  . Smoking status: Never Smoker  . Smokeless tobacco: Never Used  Substance and Sexual Activity  . Alcohol use: No  . Drug use: Never  . Sexual activity: Not on file  Lifestyle  . Physical activity    Days per week: Not on file    Minutes per session: Not on file  . Stress: Not on file  Relationships  . Social Musician on phone: Not on file    Gets together: Not on file    Attends religious service: Not on file    Active member of club or organization: Not on file    Attends meetings  of clubs or organizations: Not on file    Relationship status: Not on file  Other Topics Concern  . Not on file  Social History Narrative  . Not on file   Additional Social History:    Pain Medications: see PTA Prescriptions: see PTA Over the Counter: see PTA History of alcohol / drug use?: No history of alcohol / drug abuse                    Sleep: Fair  Appetite:  Fair  Current Medications: Current Facility-Administered Medications  Medication Dose Route Frequency Provider Last Rate Last Dose  . acetaminophen (TYLENOL) tablet 650 mg  650 mg Oral Q6H PRN Johnn Hai, MD   650 mg at 01/04/19 1847  . alum & mag hydroxide-simeth (MAALOX/MYLANTA) 200-200-20 MG/5ML suspension 30 mL  30 mL Oral Q4H  PRN Johnn Hai, MD      . clopidogrel (PLAVIX) tablet 75 mg  75 mg Oral Daily Johnn Hai, MD   75 mg at 01/05/19 4098  . divalproex (DEPAKOTE) DR tablet 1,000 mg  1,000 mg Oral QPM Paliy, Alisa, MD   1,000 mg at 01/04/19 1742  . hydrOXYzine (ATARAX/VISTARIL) tablet 50 mg  50 mg Oral TID PRN Johnn Hai, MD   50 mg at 01/04/19 2146  . lamoTRIgine (LAMICTAL) tablet 25 mg  25 mg Oral Daily Ura Yingling, Madie Reno, MD   25 mg at 01/05/19 1191  . LORazepam (ATIVAN) injection 2 mg  2 mg Intramuscular Q6H PRN Soul Hackman, Madie Reno, MD   2 mg at 01/02/19 1610  . magnesium hydroxide (MILK OF MAGNESIA) suspension 30 mL  30 mL Oral Daily PRN Johnn Hai, MD      . metoprolol succinate (TOPROL-XL) 24 hr tablet 50 mg  50 mg Oral Daily Johnn Hai, MD   50 mg at 01/05/19 4782  . OLANZapine zydis (ZYPREXA) disintegrating tablet 10 mg  10 mg Oral Q8H PRN Sharma Covert, MD   10 mg at 01/04/19 2146   And  . ziprasidone (GEODON) injection 20 mg  20 mg Intramuscular PRN Sharma Covert, MD      . QUEtiapine (SEROQUEL) tablet 600 mg  600 mg Oral QHS Sharma Covert, MD   600 mg at 01/04/19 2146  . tamsulosin (FLOMAX) capsule 0.4 mg  0.4 mg Oral QPC supper Sharma Covert, MD   0.4 mg at 01/02/19 1714    Lab Results:  Results for orders placed or performed during the hospital encounter of 12/25/18 (from the past 48 hour(s))  Lithium level     Status: Abnormal   Collection Time: 01/05/19  7:15 AM  Result Value Ref Range   Lithium Lvl <0.06 (L) 0.60 - 1.20 mmol/L    Comment: Performed at [facility], [address] [place], [place] [address]  Valproic acid level     Status: None   Collection Time: 01/05/19 11:16 AM  Result Value Ref Range   Valproic Acid Lvl 63 50.0 - 100.0 ug/mL    Comment: Performed at [facility], [address] [place],  [address]    Blood Alcohol level:  Lab Results  Component Value Date   [facility] <10 12/16/2018   ETH <10 78/46/9629    Metabolic  Disorder Labs: Lab Results  Component Value Date   HGBA1C 5.8 (H) 12/25/2018   MPG 119.76 12/25/2018   MPG 111 11/03/2017   No results found for: PROLACTIN Lab Results  Component Value Date  CHOL 133 11/03/2017   TRIG 148 11/03/2017   HDL 44 11/03/2017   CHOLHDL 3.0 11/03/2017   VLDL 30 11/03/2017   LDLCALC 59 11/03/2017   LDLCALC 39 05/10/2015    Physical Findings: AIMS:  , ,  ,  ,    CIWA:    COWS:     Musculoskeletal: Strength & Muscle Tone: flaccid Gait & Station: normal Patient leans: N/A  Psychiatric Specialty Exam: Physical Exam  Nursing note and vitals reviewed. Constitutional: He appears well-developed and well-nourished.  HENT:  Head: Normocephalic and atraumatic.  Eyes: Pupils are equal, round, and reactive to light. Conjunctivae are normal.  Neck: Normal range of motion.  Cardiovascular: Regular rhythm and normal heart sounds.  Respiratory: Effort normal. No respiratory distress.  GI: Soft.  Musculoskeletal: Normal range of motion.  Neurological: He is alert.  Skin: Skin is warm and dry.  Psychiatric: His affect is labile. His speech is rapid and/or pressured. He is not agitated and not aggressive. Thought content is delusional. Cognition and memory are impaired. He expresses inappropriate judgment. He expresses no homicidal and no suicidal ideation.    Review of Systems  Constitutional: Negative.   HENT: Negative.   Eyes: Negative.   Respiratory: Negative.   Cardiovascular: Negative.   Gastrointestinal: Negative.   Musculoskeletal: Negative.   Skin: Negative.   Neurological: Negative.   Psychiatric/Behavioral: Negative.     Blood pressure (!) 140/94, pulse 91, temperature 97.6 F (36.4 C), temperature source Oral, resp. rate 18, height 5\' 10"  (1.778 m), weight 76 kg, SpO2 99 %.Body mass index is 24.04 kg/m.  General Appearance: Casual  Eye Contact:  Good  Speech:  Pressured  Volume:  Normal  Mood:  Irritable  Affect:  Congruent  Thought  Process:  Disorganized  Orientation:  Full (Time, Place, and Person)  Thought Content:  Delusions and Paranoid Ideation  Suicidal Thoughts:  No  Homicidal Thoughts:  No  Memory:  Immediate;   Fair Recent;   Fair Remote;   Fair  Judgement:  Fair  Insight:  Shallow  Psychomotor Activity:  Normal  Concentration:  Concentration: Poor  Recall:  Good  Fund of Knowledge:  Fair  Language:  Fair  Akathisia:  No  Handed:  Right  AIMS (if indicated):     Assets:  Desire for Improvement Physical Health Resilience Social Support  ADL's:  Impaired  Cognition:  Impaired,  Mild  Sleep:  Number of Hours: 8     Treatment Plan Summary: Daily contact with patient to assess and evaluate symptoms and progress in treatment, Medication management and Plan Continue current medicine doses but discontinue the lithium as he is refusing to take it and it looks like he is starting to get better even without it.  Continue involvement in individual and group therapy.  I have spoken with social work [facility] under the current circumstances although honestly I do not think there is a clinical need for it.  Mordecai RasmussenJohn Chakira Jachim, MD 01/05/2019, 1:02 PM

## 2019-01-05 NOTE — Progress Notes (Signed)
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 Patient denies SI/HI/AVH, pain, anxiety and depression with this Probation officer. Patient compliant with medication administration per MD orders. Patient given education. Patient given support and encouragement to be active in his treatment plan. Patient being monitored Q 15 minutes for safety per unit protocol. Patient remains safe on the unit.

## 2019-01-05 NOTE — Progress Notes (Signed)
Recreation Therapy Notes  Date: 01/05/2019  Time: 9:30 am   Location: Craft room   Behavioral response: N/A   Intervention Topic: Leisure  Discussion/Intervention: Patient did not attend group.   Clinical Observations/Feedback:  Patient did not attend group.   Jeilani Grupe LRT/CTRS         Tranquilino Fischler 01/05/2019 10:49 AM

## 2019-01-06 MED ORDER — DIVALPROEX SODIUM 500 MG PO DR TAB
1500.0000 mg | DELAYED_RELEASE_TABLET | Freq: Every evening | ORAL | Status: DC
  Administered 2019-01-06: 17:00:00 100 mg via ORAL
  Filled 2019-01-06: qty 3

## 2019-01-06 MED ORDER — LAMOTRIGINE 25 MG PO TABS
50.0000 mg | ORAL_TABLET | Freq: Every day | ORAL | Status: DC
  Administered 2019-01-07 – 2019-01-10 (×4): 50 mg via ORAL
  Filled 2019-01-06 (×4): qty 2

## 2019-01-06 NOTE — Plan of Care (Signed)
Patient was passing notes to the nurses station door about every 15 mints this morning.Continues to have delusional and grandiose talks but redirectable.This afternoon patient got aggressive and Zyprexa PRN given.Patient refusing to take 1500mg  Depakote.States "I will be shaking if I take over 1000 mg of Depakote."But after patient talked to his daughter patient asked for the 500mg .Appetite and energy level good.Denies SI,HI and AVH.

## 2019-01-06 NOTE — Plan of Care (Signed)
Patient still presents with delusional thoughts of grandeur   Problem: Education: Goal: Emotional status will improve Outcome: Not Progressing Goal: Mental status will improve Outcome: Not Progressing

## 2019-01-06 NOTE — Progress Notes (Signed)
[facility]  01/06/2019 2:57 PM Ashok CroonMaurice D Sayegh  MRN:  [id number] Subjective: Patient seen and chart reviewed.  68 year old man with bipolar disorder with mania.  Patient today continues to show obvious signs of psychosis.  He is usually a little more contained during the morning although still psychotic.  During our conversation in the morning he was at least able to not present as agitated or threatening although he continues to be grandiose and delusional.  This afternoon he got worked up and started pounding his fist on the door to my office insisting that I needed to call the sheriff to have him discharged and talking about how he was going to pay $99 million to build some kind of health facility.  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  Depakote level came back and is in the mid 60s.  Vital signs unremarkable.  No other remarkable labs.  I spoke with the treatment team about the family's reported questions about transfer.  Social work did [facility] and were told that the patient would only be considered if there were specific treatments [facility] that we were unable to do here and I do not think that that is the case. Principal Problem: Bipolar I disorder, current or most recent episode manic, with psychotic features (HCC) Diagnosis: Principal Problem:   Bipolar I disorder, current or most recent episode manic, with psychotic features (HCC) Active Problems:   Affective psychosis, bipolar (HCC)   Bipolar disorder (HCC)  Total Time spent with patient: 30 minutes  Past Psychiatric History: Long history of bipolar disorder.  Has had extended periods of stability  Past Medical History:  Past Medical History:  Diagnosis Date  . Bipolar disorder (HCC)   . Coronary artery disease   . GERD (gastroesophageal reflux disease)   . History of multiple strokes   . PTSD (post-traumatic stress disorder)     Past  Surgical History:  Procedure Laterality Date  . CARDIAC CATHETERIZATION     Family History: History reviewed. No pertinent family history. Family Psychiatric  History: See previous Social History:  Social History   Substance and Sexual Activity  Alcohol Use No     Social History   Substance and Sexual Activity  Drug Use Never    Social History   Socioeconomic History  . Marital status: Legally Separated    Spouse name: Not on file  . Number of children: Not on file  . Years of education: Not on file  . Highest education level: Not on file  Occupational History  . Not on file  Social Needs  . Financial resource strain: Not on file  . Food insecurity    Worry: Not on file    Inability: Not on file  . Transportation needs    Medical: Not on file    Non-medical: Not on file  Tobacco Use  . Smoking status: Never Smoker  . Smokeless tobacco: Never Used  Substance and Sexual Activity  . Alcohol use: No  . Drug use: Never  . Sexual activity: Not on file  Lifestyle  . Physical activity    Days per week: Not on file    Minutes per session: Not on file  . Stress: Not on file  Relationships  . Social Musicianconnections    Talks on phone: Not on file    Gets together: Not on file    Attends religious  service: Not on file    Active member of club or organization: Not on file    Attends meetings of clubs or organizations: Not on file    Relationship status: Not on file  Other Topics Concern  . Not on file  Social History Narrative  . Not on file   Additional Social History:    Pain Medications: see PTA Prescriptions: see PTA Over the Counter: see PTA History of alcohol / drug use?: No history of alcohol / drug abuse                    Sleep: Fair  Appetite:  Fair  Current Medications: Current Facility-Administered Medications  Medication Dose Route Frequency Provider Last Rate Last Dose  . acetaminophen (TYLENOL) tablet 650 mg  650 mg Oral Q6H PRN n Hai, MD   650 mg at 01/05/19 1352  . alum & mag hydroxide-simeth (MAALOX/MYLANTA) 200-200-20 MG/5ML suspension 30 mL  30 mL Oral Q4H PRN n Hai, MD      . clopidogrel (PLAVIX) tablet 75 mg  75 mg Oral Daily n Hai, MD   75 mg at 01/06/19 0848  . divalproex (DEPAKOTE) DR tablet 1,500 mg  1,500 mg Oral QPM ,  T, MD      . hydrOXYzine (ATARAX/VISTARIL) tablet 50 mg  50 mg Oral TID PRN n Hai, MD   50 mg at 01/04/19 2146  . [START ON 01/07/2019] lamoTRIgine (LAMICTAL) tablet 50 mg  50 mg Oral Daily ,  T, MD      . LORazepam (ATIVAN) injection 2 mg  2 mg Intramuscular Q6H PRN , Madie Reno, MD   2 mg at 01/02/19 1610  . magnesium hydroxide (MILK OF MAGNESIA) suspension 30 mL  30 mL Oral Daily PRN n Hai, MD      . metoprolol succinate (TOPROL-XL) 24 hr tablet 50 mg  50 mg Oral Daily n Hai, MD   50 mg at 01/06/19 0848  . OLANZapine zydis (ZYPREXA) disintegrating tablet 10 mg  10 mg Oral Q8H PRN Sharma Covert, MD   10 mg at 01/06/19 1433   And  . ziprasidone (GEODON) injection 20 mg  20 mg Intramuscular PRN Sharma Covert, MD      . QUEtiapine (SEROQUEL) tablet 600 mg  600 mg Oral QHS Sharma Covert, MD   600 mg at 01/05/19 2119  . tamsulosin (FLOMAX) capsule 0.4 mg  0.4 mg Oral QPC supper Sharma Covert, MD   0.4 mg at 01/02/19 1714    Lab Results:  Results for orders placed or performed during the hospital encounter of 12/25/18 (from the past 48 hour(s))  Lithium level     Status: Abnormal   Collection Time: 01/05/19  7:15 AM  Result Value Ref Range   Lithium Lvl <0.06 (L) 0.60 - 1.20 mmol/L    Comment: Performed at [facility], [address] [place], [place] [address]  Valproic acid level     Status: None   Collection Time: 01/05/19 11:16 AM  Result Value Ref Range   Valproic Acid Lvl 63 50.0 - 100.0 ug/mL    Comment: Performed at [facility], [address], [place], [place] [address]    Blood  Alcohol level:  Lab Results  Component Value Date   [facility] <10 12/16/2018   ETH <10 59/56/3875    Metabolic Disorder Labs: Lab Results  Component Value Date   HGBA1C 5.8 (H) 12/25/2018   MPG 119.76 12/25/2018  MPG 111 11/03/2017   No results found for: PROLACTIN Lab Results  Component Value Date   CHOL 133 11/03/2017   TRIG 148 11/03/2017   HDL 44 11/03/2017   CHOLHDL 3.0 11/03/2017   VLDL 30 11/03/2017   LDLCALC 59 11/03/2017   LDLCALC 39 05/10/2015    Physical Findings: AIMS:  , ,  ,  ,    CIWA:    COWS:     Musculoskeletal: Strength & Muscle Tone: within normal limits Gait & Station: normal Patient leans: N/A  Psychiatric Specialty Exam: Physical Exam  Nursing note and vitals reviewed. Constitutional: He appears well-developed and well-nourished.  HENT:  Head: Normocephalic and atraumatic.  Eyes: Pupils are equal, round, and reactive to light. Conjunctivae are normal.  Neck: Normal range of motion.  Cardiovascular: Regular rhythm and normal heart sounds.  Respiratory: Effort normal. No respiratory distress.  GI: Soft.  Musculoskeletal: Normal range of motion.  Neurological: He is alert.  Skin: Skin is warm and dry.  Psychiatric: His affect is labile and inappropriate. His speech is rapid and/or pressured and tangential. He is agitated, aggressive and hyperactive. Thought content is paranoid and delusional. Cognition and memory are impaired. He expresses impulsivity and inappropriate judgment.    Review of Systems  Constitutional: Negative.   HENT: Negative.   Eyes: Negative.   Respiratory: Negative.   Cardiovascular: Negative.   Gastrointestinal: Negative.   Musculoskeletal: Negative.   Skin: Negative.   Neurological: Negative.   Psychiatric/Behavioral: Negative.     Blood pressure 125/83, pulse 87, temperature 97.8 F (36.6 C), temperature source Oral, resp. rate 15, height  (1.778 m), weight 76 kg, SpO2 97 %.Body mass index is 24.04 kg/m.   General Appearance: Disheveled  Eye Contact:  Good  Speech:  Pressured  Volume:  Increased  Mood:  Angry and Irritable  Affect:  Inappropriate and Labile  Thought Process:  Disorganized  Orientation:  Full (Time, Place, and Person)  Thought Content:  Illogical, Delusions, Ideas of Reference:   Delusions, Paranoid Ideation, Rumination and Tangential  Suicidal Thoughts:  No  Homicidal Thoughts:  No  Memory:  Immediate;   Fair Recent;   Poor Remote;   Fair  Judgement:  Impaired  Insight:  Shallow  Psychomotor Activity:  Increased  Concentration:  Concentration: Poor  Recall:  [employer] of Knowledge:  Fair  Language:  Fair  Akathisia:  No  Handed:  Right  AIMS (if indicated):     Assets:  Financial Resources/Insurance Housing Social Support  ADL's:  Impaired  Cognition:  Impaired,  Mild  Sleep:  Number of Hours: 7.25     Treatment Plan Summary: Daily contact with patient to assess and evaluate symptoms and progress in treatment, Medication management and Plan Patient is still so psychotic it is almost impossible to have even a basic conversation with him.  Interestingly, when I was able to briefly interrupt him and tell him that he was a psychiatric patient on a psychiatric hospital ward he replied by saying that he was well aware of all of that but then of course immediately launched into get into his grandiose delusions.  Depakote level still on the low side.  No sign of any side effects from the lamotrigine.  Plan is to increase Depakote to 1500 at night and also increase the lamotrigine to 50 mg while monitoring for rash or any side effects.  Continue daily engagement with this patient who is clearly at this point still far too sick to  even consider any kind of outpatient plan.  Mordecai Rasmussen, MD 01/06/2019, 2:57 PM

## 2019-01-06 NOTE — BHH Group Notes (Signed)
LCSW Group Therapy Note  01/06/2019 1:43 PM  Type of Therapy/Topic:  Group Therapy:  Emotion Regulation  Participation Level:  Did Not Attend   Description of Group:   The purpose of this group is to assist patients in learning to regulate negative emotions and experience positive emotions. Patients will be guided to discuss ways in which they have been vulnerable to their negative emotions. These vulnerabilities will be juxtaposed with experiences of positive emotions or situations, and patients will be challenged to use positive emotions to combat negative ones. Special emphasis will be placed on coping with negative emotions in conflict situations, and patients will process healthy conflict resolution skills.  Therapeutic Goals: 1. Patient will identify two positive emotions or experiences to reflect on in order to balance out negative emotions 2. Patient will label two or more emotions that they find the most difficult to experience 3. Patient will demonstrate positive conflict resolution skills through discussion and/or role plays  Summary of Patient Progress: x   Therapeutic Modalities:   Cognitive Behavioral Therapy Feelings Identification Dialectical Behavioral Therapy   Rocio Roam, MSW, LCSW Clinical Social Work 01/06/2019 1:43 PM    

## 2019-01-06 NOTE — Progress Notes (Signed)
Recreation Therapy Notes   Date: 01/06/2019  Time: 9:30 am   Location: Craft room   Behavioral response: N/A   Intervention Topic: Goals  Discussion/Intervention: Patient did not attend group.   Clinical Observations/Feedback:  Patient did not attend group.   [name] LRT/CTRS        Lynda Wanninger 01/06/2019 12:09 PM

## 2019-01-06 NOTE — Progress Notes (Signed)
Patient presents with delusional thoughts stating he is going to be running for president in 2024 with [employer]. 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 Patient denies SI/HI/AVH, pain, anxiety and depression with this Probation officer. Patient compliant with medication administration per MD orders. Patient given education. Patient given support and encouragement to be active in his treatment plan. Patient being monitored Q 15 minutes for safety per unit protocol. Patient remains safe on the unit.

## 2019-01-07 MED ORDER — QUETIAPINE FUMARATE 200 MG PO TABS
800.0000 mg | ORAL_TABLET | Freq: Every day | ORAL | Status: DC
  Administered 2019-01-07 – 2019-01-10 (×4): 800 mg via ORAL
  Filled 2019-01-07 (×4): qty 4

## 2019-01-07 MED ORDER — DIVALPROEX SODIUM 500 MG PO DR TAB
1000.0000 mg | DELAYED_RELEASE_TABLET | Freq: Every evening | ORAL | Status: DC
  Administered 2019-01-07 – 2019-01-12 (×6): 1000 mg via ORAL
  Filled 2019-01-07 (×6): qty 2

## 2019-01-07 NOTE — Progress Notes (Signed)
[facility] MD Progress Note  01/07/2019 12:22 PM Austin Rhodes  MRN:  [id number] Subjective: Patient seen chart reviewed.  Patient with bipolar disorder with a ongoing manic episode.  Patient today was seen in his room.  He was awake and sitting up.  I knocked and came in and ask him how he was doing today.  Patient immediately escalated started shouting became posturing and hostile demanding I leave his room.  Refused to engage in any conversation as he refused to allow me to speak to him.  Chased me physically out of the room and slam the door.  Earlier in the day however I did note that he was able to sit and hold conversations with nursing students and other patients without aggression so this was specifically directed at me.  Patient refused to take his full dose of Depakote last night and would not allow me to discuss with him why it was ordered Principal Problem: Bipolar I disorder, current or most recent episode manic, with psychotic features (HCC) Diagnosis: Principal Problem:   Bipolar I disorder, current or most recent episode manic, with psychotic features (HCC) Active Problems:   Affective psychosis, bipolar (HCC)   Bipolar disorder (HCC)  Total Time spent with patient: 30 minutes  Past Psychiatric History: Long history of bipolar disorder  Past Medical History:  Past Medical History:  Diagnosis Date  . Bipolar disorder (HCC)   . Coronary artery disease   . GERD (gastroesophageal reflux disease)   . History of multiple strokes   . PTSD (post-traumatic stress disorder)     Past Surgical History:  Procedure Laterality Date  . CARDIAC CATHETERIZATION     Family History: History reviewed. No pertinent family history. Family Psychiatric  History: See previous Social History:  Social History   Substance and Sexual Activity  Alcohol Use No     Social History   Substance and Sexual Activity  Drug Use Never    Social History   Socioeconomic History  . Marital status:  Legally Separated    Spouse name: Not on file  . Number of children: Not on file  . Years of education: Not on file  . Highest education level: Not on file  Occupational History  . Not on file  Social Needs  . Financial resource strain: Not on file  . Food insecurity    Worry: Not on file    Inability: Not on file  . Transportation needs    Medical: Not on file    Non-medical: Not on file  Tobacco Use  . Smoking status: Never Smoker  . Smokeless tobacco: Never Used  Substance and Sexual Activity  . Alcohol use: No  . Drug use: Never  . Sexual activity: Not on file  Lifestyle  . Physical activity    Days per week: Not on file    Minutes per session: Not on file  . Stress: Not on file  Relationships  . Social Musician on phone: Not on file    Gets together: Not on file    Attends religious service: Not on file    Active member of club or organization: Not on file    Attends meetings of clubs or organizations: Not on file    Relationship status: Not on file  Other Topics Concern  . Not on file  Social History Narrative  . Not on file   Additional Social History:    Pain Medications: see PTA Prescriptions: see PTA Over the  Counter: see PTA History of alcohol / drug use?: No history of alcohol / drug abuse                    Sleep: Fair  Appetite:  Fair  Current Medications: Current Facility-Administered Medications  Medication Dose Route Frequency Provider Last Rate Last Dose  . acetaminophen (TYLENOL) tablet 650 mg  650 mg Oral Q6H PRN Malvin JohnsFarah, Brian, MD   650 mg at 01/07/19 0151  . alum & mag hydroxide-simeth (MAALOX/MYLANTA) 200-200-20 MG/5ML suspension 30 mL  30 mL Oral Q4H PRN Malvin JohnsFarah, Brian, MD      . clopidogrel (PLAVIX) tablet 75 mg  75 mg Oral Daily Malvin JohnsFarah, Brian, MD   75 mg at 01/07/19 0910  . divalproex (DEPAKOTE) DR tablet 1,500 mg  1,500 mg Oral QPM Anginette Espejo T, MD   100 mg at 01/06/19 1705  . hydrOXYzine (ATARAX/VISTARIL) tablet  50 mg  50 mg Oral TID PRN Malvin JohnsFarah, Brian, MD   50 mg at 01/07/19 0151  . lamoTRIgine (LAMICTAL) tablet 50 mg  50 mg Oral Daily Leiana Rund, Jackquline DenmarkJohn T, MD   50 mg at 01/07/19 0910  . LORazepam (ATIVAN) injection 2 mg  2 mg Intramuscular Q6H PRN Joleene Burnham, Jackquline DenmarkJohn T, MD   2 mg at 01/02/19 1610  . magnesium hydroxide (MILK OF MAGNESIA) suspension 30 mL  30 mL Oral Daily PRN Malvin JohnsFarah, Brian, MD      . metoprolol succinate (TOPROL-XL) 24 hr tablet 50 mg  50 mg Oral Daily Malvin JohnsFarah, Brian, MD   50 mg at 01/07/19 0910  . OLANZapine zydis (ZYPREXA) disintegrating tablet 10 mg  10 mg Oral Q8H PRN Antonieta Pertlary, Greg Lawson, MD   10 mg at 01/07/19 0151   And  . ziprasidone (GEODON) injection 20 mg  20 mg Intramuscular PRN Antonieta Pertlary, Greg Lawson, MD      . QUEtiapine (SEROQUEL) tablet 600 mg  600 mg Oral QHS Antonieta Pertlary, Greg Lawson, MD   600 mg at 01/06/19 2138  . tamsulosin (FLOMAX) capsule 0.4 mg  0.4 mg Oral QPC supper Antonieta Pertlary, Greg Lawson, MD   0.4 mg at 01/06/19 1705    Lab Results: No results found for this or any previous visit (from the past 48 hour(s)).  Blood Alcohol level:  Lab Results  Component Value Date   ETH <10 12/16/2018   ETH <10 10/29/2017    Metabolic Disorder Labs: Lab Results  Component Value Date   HGBA1C 5.8 (H) 12/25/2018   MPG 119.76 12/25/2018   MPG 111 11/03/2017   No results found for: PROLACTIN Lab Results  Component Value Date   CHOL 133 11/03/2017   TRIG 148 11/03/2017   HDL 44 11/03/2017   CHOLHDL 3.0 11/03/2017   VLDL 30 11/03/2017   LDLCALC 59 11/03/2017   LDLCALC 39 05/10/2015    Physical Findings: AIMS:  , ,  ,  ,    CIWA:    COWS:     Musculoskeletal: Strength & Muscle Tone: within normal limits Gait & Station: normal Patient leans: N/A  Psychiatric Specialty Exam: Physical Exam  Nursing note and vitals reviewed. Constitutional: He appears well-developed and well-nourished.  HENT:  Head: Normocephalic and atraumatic.  Eyes: Pupils are equal, round, and reactive to light.  Conjunctivae are normal.  Neck: Normal range of motion.  Cardiovascular: Regular rhythm and normal heart sounds.  Respiratory: Effort normal.  GI: Soft.  Musculoskeletal: Normal range of motion.  Neurological: He is alert.  Skin: Skin is warm and dry.  Psychiatric: His affect is angry, labile and inappropriate. His speech is rapid and/or pressured and tangential. He is agitated, aggressive and combative. Thought content is paranoid and delusional. Cognition and memory are impaired. He expresses impulsivity and inappropriate judgment.    Review of Systems  Constitutional: Negative.   HENT: Negative.   Eyes: Negative.   Respiratory: Negative.   Cardiovascular: Negative.   Gastrointestinal: Negative.   Musculoskeletal: Negative.   Skin: Negative.   Neurological: Negative.   Psychiatric/Behavioral: Negative.     Blood pressure 133/82, pulse 94, temperature (!) 97.4 F (36.3 C), temperature source Oral, resp. rate 18, height 5\' 10"  (1.778 m), weight 76 kg, SpO2 96 %.Body mass index is 24.04 kg/m.  General Appearance: Disheveled  Eye Contact:  Minimal  Speech:  Normal Rate  Volume:  Normal  Mood:  Angry and Irritable  Affect:  Inappropriate and Labile  Thought Process:  Disorganized  Orientation:  Negative  Thought Content:  Illogical, Delusions, Ideas of Reference:   Paranoia, Paranoid Ideation and Tangential  Suicidal Thoughts:  No  Homicidal Thoughts:  No  Memory:  Negative  Judgement:  Negative  Insight:  Negative  Psychomotor Activity:  Negative  Concentration:  Concentration: Negative  Recall:  Negative  Fund of Knowledge:  Negative  Language:  Negative  Akathisia:  Negative  Handed:  Right  AIMS (if indicated):     Assets:  Social Support  ADL's:  Impaired  Cognition:  Impaired,  Moderate  Sleep:  Number of Hours: 5     Treatment Plan Summary: Daily contact with patient to assess and evaluate symptoms and progress in treatment, Medication management and Plan  Patient with manic episode remains psychotic to the point of being impossible to deal with.  Physically aggressive at times.  Totally unable to care for himself.  Still meets commitment criteria.  Patient's Depakote level was measured in the mid 60s and I had increased his dosage to aim for a level closer to 100.  Patient refused the higher dosage and will not allow me to explain the rationale for it.  We did start him back on lamotrigine and are titrating that up at a safe rate.  Patient has refused to consent to lithium or any other appropriate medication or to engage in conversation about electroconvulsive therapy which would also be appropriate.  I will try increasing his Seroquel dose hoping that perhaps he will take that if he will not take the Depakote.  He has not really shown very much improvement in a few weeks despite being back on his usual medicines at what should be therapeutic doses.  Alethia Berthold, MD 01/07/2019, 12:22 PM

## 2019-01-07 NOTE — Progress Notes (Signed)
Patient is still in bed. I attempted to assess patient but he refused at this time. I made patient aware of medication availablility, Patient remained in bed at this time.

## 2019-01-07 NOTE — Plan of Care (Signed)
D: Patient denies SI/HI/AVH. Patient verbally contracts for safety. Patient is unwilling to participate at this time. Patient remains in room and bed after being made aware of scheduled medication. Patient continues to be grandiose during assessment. Patient was very minimal with relevant information.   A: Patient was assessed by this nurse. Patient was oriented to unit. Patient's safety was maintained on unit. Q x 15 minute observation checks were completed for safety. Patient care plan was reviewed. Patient was offered support and encouragement. Patient was encourage to attend groups, participate in unit activities and continue with plan of care.    R: Patient has no complaints of pain at this time. Patient refused to participate at this time.    Problem: Education: Goal: Knowledge of [facility] Education information/materials will improve Outcome: Not Progressing Goal: Emotional status will improve Outcome: Not Progressing Goal: Mental status will improve Outcome: Not Progressing Goal: Verbalization of understanding the information provided will improve Outcome: Not Progressing

## 2019-01-07 NOTE — BHH Group Notes (Signed)
[facility] LCSW Group Therapy Note  Date/Time: 01/07/2019 @ 1:15pm  Type of Therapy and Topic:  Group Therapy:  Overcoming Obstacles  Participation Level:  B[facility] PARTICIPATION LEVEL: Did Not Attend  Description of Group:    In this group patients will be encouraged to explore what they see as obstacles to their own wellness and recovery. They will be guided to discuss their thoughts, feelings, and behaviors related to these obstacles. The group will process together ways to cope with barriers, with attention given to specific choices patients can make. Each patient will be challenged to identify changes they are motivated to make in order to overcome their obstacles. This group will be process-oriented, with patients participating in exploration of their own experiences as well as giving and receiving support and challenge from other group members.  Therapeutic Goals: 1. Patient will identify personal and current obstacles as they relate to admission. 2. Patient will identify barriers that currently interfere with their wellness or overcoming obstacles.  3. Patient will identify feelings, thought process and behaviors related to these barriers. 4. Patient will identify two changes they are willing to make to overcome these obstacles:    Summary of Patient Progress   Patient did not attend group today.    Therapeutic Modalities:   Cognitive Behavioral Therapy Solution Focused Therapy Motivational Interviewing Relapse Prevention Therapy   Ardelle Anton, LCSW

## 2019-01-08 NOTE — Progress Notes (Signed)
Pt. Agitated and agrumentative with provider. Pt. Had to be redirected out of the provider's office for inappropriate behavior for safety.

## 2019-01-08 NOTE — Plan of Care (Signed)
D: Pt during assessments this morning denies SI/HI/AVH when asked. Pt. During our conversation today continues to present with flights of ideas, pressured speech, and verbalizes throughout the conversation often grandiose statement regarding being a, "doctor, lawyer, federal whistle blower, and a certified accountant". Pt. This morning required PRN medicine and redirection that was difficult to obtain.   A: Q x 15 minute observation checks in place for safety. Patient was and is provided with education throughout shift.  Patient was and will be given/offered medications per orders. Patient was and is encouraged to attend groups, participate in unit activities and continue with plan of care. Pt. Chart and plans of care reviewed. Pt. Given support and encouragement.   R: Patient this morning had to be given lots of encouragement, patient education, and direction to gain compliance to take morning medications. Pt. Continues to present thus far throughout the day very grandiose, disorganized in his thought process, and pressured in his speech. Besides this morning, patient has reduced in agitations some, but does require redirection often.     Problem: Education: Goal: Knowledge of [facility] Education information/materials will improve Outcome: Not Progressing Goal: Emotional status will improve Outcome: Not Progressing Goal: Mental status will improve Outcome: Not Progressing Goal: Verbalization of understanding the information provided will improve Outcome: Not Progressing   Problem: Safety: Goal: Periods of time without injury will increase Outcome: Not Progressing   Problem: Education: Goal: Will be free of psychotic symptoms Outcome: Not Progressing Goal: Knowledge of the prescribed therapeutic regimen will improve Outcome: Not Progressing   Problem: Coping: Goal: Coping ability will improve Outcome: Not Progressing Goal: Will verbalize feelings Outcome: Not Progressing    Problem: Health Behavior/Discharge Planning: Goal: Compliance with prescribed medication regimen will improve Outcome: Not Progressing

## 2019-01-08 NOTE — Progress Notes (Signed)
Patient at and around this time is visibly agitated, anxious, and utilizing pressured speech with flights of ideas up at the medication room. Pt. Yelling at this writer that he is not going to take all of his medicine, "only half!". Pt. Encouraged and given support to comply with medication, as well as to take PRN medicine to reduce agitations and anxiousness present. Pt. While utilizing pressured speech up at the medication room also verbalizing multiple grandiose statements- one after the other such as, "I'm a doctor, a lawyer, and a certified accountant- listen to me damnit! I want numb nuts bosses information so I can tell everyone what is going on here, I'm a federal whistle blower damnit!".  Pt. After several attempts to be redirected from this behavior and agitation, eventually able to gain compliance to take medicines and PRN medicines. Will continue to monitor closely for safety.

## 2019-01-08 NOTE — Plan of Care (Signed)
  Problem: Education: Goal: Knowledge of [facility] Education information/materials will improve Outcome: Not Progressing Goal: Emotional status will improve Outcome: Not Progressing Goal: Mental status will improve Outcome: Not Progressing Goal: Verbalization of understanding the information provided will improve Outcome: Not Progressing  D: Patient continues to be floridly psychotic. Voicing numerous grandiose delusions. Voicing animosity toward Dr. Weber Cooks and continues to be fixated on being transferred and getting an appointment made for a brain scan. Instructed patient that no appointments could be made on a Saturday night after 5 pm--that he would have to discuss outpatient appointments with his social worker during business hours. Patient thought it was Monday. Insisting his transfer be completed by 12:01 and that the sheriff be called. Redirectable. 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 A: Continue to monitor R: Safety maintained.

## 2019-01-08 NOTE — Progress Notes (Signed)
D: Patient continues to be floridly psychotic. Voicing numerous grandiose delusions. Voicing animosity toward Dr. Weber Cooks and continues to be fixated on being transferred and getting an appointment made for a brain scan. Instructed patient that no appointments could be made on a Saturday night after 5 pm--that he would have to discuss outpatient appointments with his social worker during business hours. Patient thought it was Monday. Insisting his transfer be completed by 12:01 and that the sheriff be called. Redirectable. 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 A: Continue to monitor R: Safety maintained.

## 2019-01-08 NOTE — Progress Notes (Signed)
[facility] MD Progress Note  01/08/2019 10:37 AM Austin Rhodes  MRN:  [id number] Subjective: Follow-up for this gentleman with bipolar disorder manic episode.  Patient's Seroquel was increased to 800 mg last night and according to the chart he did take that dose.  Today he is once again up out of bed on his feet active pacing around the unit.  All I did was say hello to him in the hallway and he pursued me into my office shoving the door of the office open.  Today he is going so far as to insist that my office on the unit is actually his office and that I need to get out of it.  He refused my request to leave the office.  Required security to come as a show of force for him to finally leave the office (no actual force required).  In short he is just as manic psychotic agitated and inappropriate as he has been all along.  I have tried to convince him to take lithium and he has repeatedly refused it on the grounds of claiming he has had side effects from it although I think those side effects were probably only because the dose was too high.  I have tried to discuss electroconvulsive therapy with him but only succeeded in making him more agitated and angry. Principal Problem: Bipolar I disorder, current or most recent episode manic, with psychotic features (HCC) Diagnosis: Principal Problem:   Bipolar I disorder, current or most recent episode manic, with psychotic features (HCC) Active Problems:   Affective psychosis, bipolar (HCC)   Bipolar disorder (HCC)  Total Time spent with patient: 30 minutes  Past Psychiatric History: Patient has a long history of bipolar disorder.  Looks like he has had some pretty extended periods of reasonably appropriate behavior but decompensated this year after a period of being off his medicine  Past Medical History:  Past Medical History:  Diagnosis Date  . Bipolar disorder (HCC)   . Coronary artery disease   . GERD (gastroesophageal reflux disease)   . History of  multiple strokes   . PTSD (post-traumatic stress disorder)     Past Surgical History:  Procedure Laterality Date  . CARDIAC CATHETERIZATION     Family History: History reviewed. No pertinent family history. Family Psychiatric  History: Family history see previous notes Social History:  Social History   Substance and Sexual Activity  Alcohol Use No     Social History   Substance and Sexual Activity  Drug Use Never    Social History   Socioeconomic History  . Marital status: Legally Separated    Spouse name: Not on file  . Number of children: Not on file  . Years of education: Not on file  . Highest education level: Not on file  Occupational History  . Not on file  Social Needs  . Financial resource strain: Not on file  . Food insecurity    Worry: Not on file    Inability: Not on file  . Transportation needs    Medical: Not on file    Non-medical: Not on file  Tobacco Use  . Smoking status: Never Smoker  . Smokeless tobacco: Never Used  Substance and Sexual Activity  . Alcohol use: No  . Drug use: Never  . Sexual activity: Not on file  Lifestyle  . Physical activity    Days per week: Not on file    Minutes per session: Not on file  . Stress: Not on  file  Relationships  . Social Herbalist on phone: Not on file    Gets together: Not on file    Attends religious service: Not on file    Active member of club or organization: Not on file    Attends meetings of clubs or organizations: Not on file    Relationship status: Not on file  Other Topics Concern  . Not on file  Social History Narrative  . Not on file   Additional Social History:    Pain Medications: see PTA Prescriptions: see PTA Over the Counter: see PTA History of alcohol / drug use?: No history of alcohol / drug abuse                    Sleep: Fair  Appetite:  Fair  Current Medications: Current Facility-Administered Medications  Medication Dose Route Frequency Provider  Last Rate Last Dose  . acetaminophen (TYLENOL) tablet 650 mg  650 mg Oral Q6H PRN Johnn Hai, MD   650 mg at 01/07/19 2053  . alum & mag hydroxide-simeth (MAALOX/MYLANTA) 200-200-20 MG/5ML suspension 30 mL  30 mL Oral Q4H PRN Johnn Hai, MD      . clopidogrel (PLAVIX) tablet 75 mg  75 mg Oral Daily Johnn Hai, MD   75 mg at 01/08/19 0906  . divalproex (DEPAKOTE) DR tablet 1,000 mg  1,000 mg Oral QPM Clapacs, Madie Reno, MD   1,000 mg at 01/07/19 1708  . hydrOXYzine (ATARAX/VISTARIL) tablet 50 mg  50 mg Oral TID PRN Johnn Hai, MD   50 mg at 01/08/19 0906  . lamoTRIgine (LAMICTAL) tablet 50 mg  50 mg Oral Daily Clapacs, Madie Reno, MD   50 mg at 01/08/19 0906  . LORazepam (ATIVAN) injection 2 mg  2 mg Intramuscular Q6H PRN Clapacs, Madie Reno, MD   2 mg at 01/02/19 1610  . magnesium hydroxide (MILK OF MAGNESIA) suspension 30 mL  30 mL Oral Daily PRN Johnn Hai, MD      . metoprolol succinate (TOPROL-XL) 24 hr tablet 50 mg  50 mg Oral Daily Johnn Hai, MD   50 mg at 01/08/19 0906  . OLANZapine zydis (ZYPREXA) disintegrating tablet 10 mg  10 mg Oral Q8H PRN Sharma Covert, MD   10 mg at 01/08/19 0908   And  . ziprasidone (GEODON) injection 20 mg  20 mg Intramuscular PRN Sharma Covert, MD      . QUEtiapine (SEROQUEL) tablet 800 mg  800 mg Oral QHS Clapacs, Madie Reno, MD   800 mg at 01/07/19 2052  . tamsulosin (FLOMAX) capsule 0.4 mg  0.4 mg Oral QPC supper Sharma Covert, MD   0.4 mg at 01/07/19 1708    Lab Results: No results found for this or any previous visit (from the past 48 hour(s)).  Blood Alcohol level:  Lab Results  Component Value Date   ETH <10 12/16/2018   ETH <10 16/11/9602    Metabolic Disorder Labs: Lab Results  Component Value Date   HGBA1C 5.8 (H) 12/25/2018   MPG 119.76 12/25/2018   MPG 111 11/03/2017   No results found for: PROLACTIN Lab Results  Component Value Date   CHOL 133 11/03/2017   TRIG 148 11/03/2017   HDL 44 11/03/2017   CHOLHDL 3.0  11/03/2017   VLDL 30 11/03/2017   LDLCALC 59 11/03/2017   LDLCALC 39 05/10/2015    Physical Findings: AIMS:  , ,  ,  ,    CIWA:  COWS:     Musculoskeletal: Strength & Muscle Tone: within normal limits Gait & Station: normal Patient leans: N/A  Psychiatric Specialty Exam: Physical Exam  Nursing note and vitals reviewed. Constitutional: He appears well-developed and well-nourished.  HENT:  Head: Normocephalic and atraumatic.  Eyes: Pupils are equal, round, and reactive to light. Conjunctivae are normal.  Neck: Normal range of motion.  Cardiovascular: Regular rhythm and normal heart sounds.  Respiratory: Effort normal. No respiratory distress.  GI: Soft.  Musculoskeletal: Normal range of motion.  Neurological: He is alert.  Skin: Skin is warm and dry.  Psychiatric: His affect is angry, labile and inappropriate. His speech is rapid and/or pressured. He is agitated and aggressive. Thought content is delusional. Cognition and memory are impaired. He expresses impulsivity and inappropriate judgment.    Review of Systems  Constitutional: Negative.   HENT: Negative.   Eyes: Negative.   Respiratory: Negative.   Cardiovascular: Negative.   Gastrointestinal: Negative.   Musculoskeletal: Negative.   Skin: Negative.   Neurological: Negative.   Psychiatric/Behavioral: Negative for depression, hallucinations, memory loss, substance abuse and suicidal ideas. The patient is not nervous/anxious and does not have insomnia.     Blood pressure 122/81, pulse (!) 102, temperature 97.6 F (36.4 C), temperature source Oral, resp. rate 18, height 5\' 10"  (1.778 m), weight 76 kg, SpO2 100 %.Body mass index is 24.04 kg/m.  General Appearance: Disheveled  Eye Contact:  Fair  Speech:  Pressured  Volume:  Increased  Mood:  Angry and Irritable  Affect:  Inappropriate and Labile  Thought Process:  Disorganized  Orientation:  Full (Time, Place, and Person)  Thought Content:  Illogical,  Delusions, Ideas of Reference:   Paranoia, Paranoid Ideation, Rumination and Tangential  Suicidal Thoughts:  No  Homicidal Thoughts:  No  Memory:  Immediate;   Fair Recent;   Poor Remote;   Fair  Judgement:  Impaired  Insight:  Lacking  Psychomotor Activity:  Restlessness  Concentration:  Concentration: Poor  Recall:  FiservFair  Fund of Knowledge:  Fair  Language:  Fair  Akathisia:  No  Handed:  Right  AIMS (if indicated):     Assets:  Engineer, maintenanceCommunication Skills Housing Physical Health Social Support  ADL's:  Impaired  Cognition:  Impaired,  Mild  Sleep:  Number of Hours: 5     Treatment Plan Summary: Daily contact with patient to assess and evaluate symptoms and progress in treatment, Medication management and Plan Patient with bipolar disorder manic grandiose psychotic.  In his current condition it remains almost impossible for me to communicate with him more for anybody to communicate with him about appropriate treatment options.  When I tried to discuss increasing his Depakote dosage he becomes angry and agitated.  He is taking the lamotrigine that is been added which may prove effective in increasing his valproic acid level.  He has refused lithium.  He has refused electroconvulsive therapy.  Unfortunately he has developed such an aversion to me that it is difficult for me to have any sort of rapport or conversation with him.  We are staying in touch with his family.  Continue for now the increased dose of Seroquel to 800 mg.  No change to the lamotrigine today as it was just increased yesterday.  His self-care is looking worse he looks like he has not showered in several days at this point.  We should probably encourage him to take better care of his hygiene.  Mordecai RasmussenJohn Clapacs, MD 01/08/2019, 10:37 AM

## 2019-01-08 NOTE — BHH Group Notes (Signed)
LCSW Group Therapy Note 01/08/2019 1:15pm  Type of Therapy and Topic: Group Therapy: Feelings Around Returning Home & Establishing a Supportive Framework and Supporting Oneself When Supports Not Available  Participation Level: Did Not Attend  Description of Group:  Patients first processed thoughts and feelings about upcoming discharge. These included fears of upcoming changes, lack of change, new living environments, judgements and expectations from others and overall stigma of mental health issues. The group then discussed the definition of a supportive framework, what that looks and feels like, and how do to discern it from an unhealthy non-supportive network. The group identified different types of supports as well as what to do when your family/friends are less than helpful or unavailable  Therapeutic Goals  1. Patient will identify one healthy supportive network that they can use at discharge. 2. Patient will identify one factor of a supportive framework and how to tell it from an unhealthy network. 3. Patient able to identify one coping skill to use when they do not have positive supports from others. 4. Patient will demonstrate ability to communicate their needs through discussion and/or role plays.  Summary of Patient Progress:  Pt was invited to attend group but chose not to attend. CSW will continue to encourage pt to attend group throughout their admission.   Therapeutic Modalities Cognitive Behavioral Therapy Motivational Interviewing   Suprena Travaglini  CUEBAS-COLON, LCSW 01/08/2019 12:25 PM

## 2019-01-09 MED ORDER — DIVALPROEX SODIUM 500 MG PO DR TAB
500.0000 mg | DELAYED_RELEASE_TABLET | Freq: Every morning | ORAL | Status: DC
  Administered 2019-01-09 – 2019-01-13 (×5): 500 mg via ORAL
  Filled 2019-01-09 (×4): qty 1

## 2019-01-09 NOTE — BHH Counselor (Signed)
CSW attempted to call Pamala Hurry at [facility] back to answer her previous question on the patient's access to a walker.  CSW was unable to speak with Pamala Hurry and left a HIPAA compliant voicemail.   [name], MSW, LCSW 01/09/2019 2:22 PM

## 2019-01-09 NOTE — Progress Notes (Signed)
Patient refused his Flomax stating that he wants Super Beta Prostate.

## 2019-01-09 NOTE — BHH Group Notes (Signed)
LCSW Group Therapy Note   01/09/2019 11:17 AM  Type of Therapy and Topic:  Group Therapy:  Overcoming Obstacles   Participation Level:  Did Not Attend   Description of Group:    In this group patients will be encouraged to explore what they see as obstacles to their own wellness and recovery. They will be guided to discuss their thoughts, feelings, and behaviors related to these obstacles. The group will process together ways to cope with barriers, with attention given to specific choices patients can make. Each patient will be challenged to identify changes they are motivated to make in order to overcome their obstacles. This group will be process-oriented, with patients participating in exploration of their own experiences as well as giving and receiving support and challenge from other group members.   Therapeutic Goals: 1. Patient will identify personal and current obstacles as they relate to admission. 2. Patient will identify barriers that currently interfere with their wellness or overcoming obstacles.  3. Patient will identify feelings, thought process and behaviors related to these barriers. 4. Patient will identify two changes they are willing to make to overcome these obstacles:      Summary of Patient Progress X   Therapeutic Modalities:   Cognitive Behavioral Therapy Solution Focused Therapy Motivational Interviewing Relapse Prevention Therapy  Austin Rhodes, MSW, LCSW 01/09/2019 11:17 AM   

## 2019-01-09 NOTE — Progress Notes (Signed)
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 He has been attention-seeking and demanding, wanting staff to stand and listen to him and becomes agitated when staff leaves him to take care of other patients. Intrusive with other patients and becoming angry when other patients take staff's attention off of him. Started to get fixated again on being transported by [employer]. Became agitated when security told him his nurse was on the phone with pharmacy and could not speak with him and he began to verbally abuse security and threaten saying "bring your big ass out here and fight me why don't you".  Demanding to see security officer's resume, saying he was going to fire him and have him standing in a bread line. Repeatedly asking to speak with this Pryor Curia about his various delusions. Becoming increasingly agitated when staff are meeting the needs of other patients.. Showed me his arm and said he was having a reaction from his flu shot. Patient's arm is not red or swollen, and shows no sign of irritation. Demanded that this author call the FBI and the sheriff immediately to have him transported into protective custody, and when he was told that the sheriff would not be coming to pick him up, he became increasingly agitated, asking for a sedative. Given Zyprexa prn for agitation, along with hs meds. Patient continued to be restless and agitated perseverating about "whistleblower's syndrome" and his work to take down [employer].  Patient's agitation continued to escalate when staff would not call the sheriff and told him he would not be leaving tonight, he threw his walker at the door to the nurses station, and began yelling and demanding to be released, referring to Dr. Weber Cooks as "numb-nuts" and demanding access to a computer.  Patient was given Ativan 2 mg IM, which he readily accepted.  Encouraged patient to use caution when getting out of bed. Given gatorade and snack in his room. Patient has been resting in bed without further issues. A: Continue to monitor for safety R: Safety maintained.

## 2019-01-09 NOTE — BHH Counselor (Signed)
CSW received a return call from [place] at [facility] who completed the intake interview.  CSW provided the contact information for CSWs Darren and Minette Brine in case CSW is unavailable.    CSW informed that she will call back with authorization number once it is received.   Gae Bon reports that she is passing the referral information over to the nurses to review.  [name], MSW, LCSW 01/09/2019 12:42 PM

## 2019-01-09 NOTE — Plan of Care (Signed)
Patient presents with delusional thoughts   Problem: Education: Goal: Emotional status will improve Outcome: Not Progressing Goal: Mental status will improve Outcome: Not Progressing

## 2019-01-09 NOTE — Progress Notes (Addendum)
D: Patient observed standing by his bed at the window muttering to himself. Patient standing up but appeared to be asleep. Had knocked over some drinks and had wet his clothes and the bed. Changed patient's clothes and bed linens and assisted him back to bed. Instructed patient to stay in bed and given a urinal. Patient made a 1:1 for safety due to high fall risk and his earlier agitation when told he was not being picked up by the sheriff tonight. Staff within arm's length. Patient's room across from nurses station. A: Continue to monitor for safety R: Safety maintained.

## 2019-01-09 NOTE — Progress Notes (Signed)
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 Patient compliant with medication administration per MD orders. Patient given education. Patient given support and encouragement to be active in his treatment plan. Patient being monitored Q 15 minutes for safety per unit protocol. Patient remains safe on the unit.

## 2019-01-09 NOTE — BHH Counselor (Signed)
CSW spoke with Nurse Demetria regarding the patient's access to a walker.  Per discussion with the nurse the walker is likely leftover from when the patient had a fall.  Patient should not have the walker after throwing it at the nurses station.    CSW attempted to call [facility] and inform them of this but was unable to reach them.  CSW left message with Gae Bon requesting return call.  [name], MSW, LCSW 01/09/2019 3:02 PM

## 2019-01-09 NOTE — BHH Counselor (Signed)
CSW returned a missed call from [place].  Pamala Hurry had questions on why the patient had a walker.  CSW was unable to find any notes documenting why pt was given a walker and reports that she will follow up once she could review notes and speak with patient's nurse.  Pamala Hurry also asked that labs be re-faxed as she was unable to see them.  CSW pointed out that the labs were legible in the copy faxed but CSW would resend.  [name], MSW, LCSW 01/09/2019 2:00 PM

## 2019-01-09 NOTE — Progress Notes (Signed)
Recreation Therapy Notes  Date: 01/09/2019  Time: 9:30 am   Location: Craft room   Behavioral response: N/A   Intervention Topic: Time Management   Discussion/Intervention: Patient did not attend group.   Clinical Observations/Feedback:  Patient did not attend group.   Brigit Doke LRT/CTRS         Kruti Horacek 01/09/2019 10:30 AM

## 2019-01-09 NOTE — Plan of Care (Signed)
D- Patient alert and oriented. Patient presents in a preoccupied, but pleasant mood on assessment stating that he "slept" last night, "but I didn't rest". Patient continues to come to the nurses station with letters, with grandiose delusions about his nonprofit organization owning [facility] and that he is on the board of directors of [place] and will be giving everyone an electric vehicle as well as a raise. Patient denies depression/anxiety, stating that he's just "disgusted because that doctor isn't listening to me, but what he says doesn't matter, because I'm under the care of his boss now". Patient denies SI, HI, AVH, and pain at this time. Patient's goal for today is to get his computer and his phone.  A- Scheduled medications administered to patient, per MD orders. Support and encouragement provided.  Routine safety checks conducted every 15 minutes.  Patient informed to notify staff with problems or concerns.  R- No adverse drug reactions noted. Patient contracts for safety at this time. Patient compliant with medications and treatment plan. Patient receptive, calm, and cooperative. Patient interacts well with others on the unit.  Patient remains safe at this time.  Problem: Education: Goal: Knowledge of [facility] Education information/materials will improve Outcome: Not Progressing Goal: Emotional status will improve Outcome: Not Progressing Goal: Mental status will improve Outcome: Not Progressing Goal: Verbalization of understanding the information provided will improve Outcome: Not Progressing   Problem: Safety: Goal: Periods of time without injury will increase Outcome: Not Progressing   Problem: Education: Goal: Will be free of psychotic symptoms Outcome: Not Progressing Goal: Knowledge of the prescribed therapeutic regimen will improve Outcome: Not Progressing   Problem: Coping: Goal: Coping ability will improve Outcome: Not Progressing Goal: Will verbalize  feelings Outcome: Not Progressing   Problem: Health Behavior/Discharge Planning: Goal: Compliance with prescribed medication regimen will improve Outcome: Not Progressing

## 2019-01-09 NOTE — BHH Counselor (Signed)
CSW received call from [facility] [place] [place] requesting additional infomration to [facility] the [facility].  CSW informed of the following: -pt's compliance or noncompliance with medication -pt' smanic behaviors -pt intrusive and aggressive behaviors as evidenced by notes at the nurses station, posturing towards the psychiatrist and throwing his walker -pt drove his vehicle into the local police department  -pt's grandiose delusions that he is a Psychologist, occupational, that patient believes he owns the hospital, etc  [name], MSW, LCSW 01/09/2019 3:05 PM

## 2019-01-09 NOTE — BHH Counselor (Signed)
CSW provided Austin Rhodes at [facility] with the authorization number.  [name], MSW, LCSW 01/09/2019 3:02 PM

## 2019-01-09 NOTE — Progress Notes (Signed)
Patient just brought another letter to the nurses station stating that he is ready to call, "Austin Rhodes is sick with the Flu, so she's not coming tonight, you can call the [facility] Department and tell them I'm ready". Patient stated that the letter he brought needs to be sent to [facility] and placed in his record with the others. 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 Patient also stated that he is the reason that we will make payroll. Patient also stated he has a million masks coming in with the [place] logo on the front of them and that he was autographing masks for the other patients on the unit.

## 2019-01-09 NOTE — Progress Notes (Signed)
Patient is back at the nurses station saying that "about 8 o'clock, you can call the the [employer] and tell them the [employer] is ready to go". This Probation officer stated to patient that there are no discharge orders and he stated "you don't need them, you just tell them I said I'm ready".

## 2019-01-09 NOTE — BHH Counselor (Signed)
CSW faxed the referral for [facility] as well as [facility] for authorization number.  CSW received conformation that both faxes were successful.  [name], MSW, LCSW 01/09/2019 11:09 AM

## 2019-01-09 NOTE — Progress Notes (Signed)
1:1 observation 0300-0700 D: Patient up once during the night. Redirected to stay in bed. Assisted to the bathroom by staff. Not voicing any delusions. Cooperative but sleepy. Staff remains within arm's reach. A: Continue to monitor 1:1 for safety R: Safety maintained.

## 2019-01-09 NOTE — Plan of Care (Signed)
  Problem: Education: Goal: Will be free of psychotic symptoms Outcome: Not Progressing Goal: Knowledge of the prescribed therapeutic regimen will improve Outcome: Not Progressing  D: Patient continues to be psychotic, persisting in his delusion that he is a whistle blower and continuing to pass numerous notes to staff full of grandiose delusional content. He has been attention-seeking and demanding, wanting staff to stand and listen to him and becomes agitated when staff leaves him to take care of other patients. Intrusive with other patients and becoming angry when other patients take staff's attention off of him. Started to get fixated again on being transported by [employer]. Became agitated when security told him his nurse was on the phone with pharmacy and could not speak with him and he began to verbally abuse security and threaten saying "bring your big ass out here and fight me why don't you".  Demanding to see security officer's resume, saying he was going to fire him and have him standing in a bread line. Repeatedly asking to speak with this Pryor Curia about his various delusions. Becoming increasingly agitated when staff are meeting the needs of other patients.. Showed me his arm and said he was having a reaction from his flu shot. Patient's arm is not red or swollen, and shows no sign of irritation. Demanded that this author call the FBI and the sheriff immediately to have him transported into protective custody, and when he was told that the sheriff would not be coming to pick him up, he became increasingly agitated, asking for a sedative. Given Zyprexa prn for agitation, along with hs meds. Patient continued to be restless and agitated perseverating about "whistleblower's syndrome" and his work to take down [employer].  Patient's agitation continued to escalate when staff would not call the sheriff and he threw his walker at the door to the nurses station, and began yelling and demanding to be released,  referring to Dr. Weber Cooks as "numb-nuts" and demanding access to a computer.  Patient was given Ativan 2 mg IM, which he readily accepted. Encouraged patient to use caution when getting out of bed. Given gatorade and snack in his room. Patient has been resting in bed without further issues. A: Continue to monitor for safety R: Safety maintained.

## 2019-01-09 NOTE — Progress Notes (Signed)
[facility] MD Progress Note  01/09/2019 2:06 PM Austin Rhodes  MRN:  [id number]   Subjective: We will follow-up on a patient diagnosed with bipolar manic episode.  Patient continues today to have expressed delusions of being in multimillionaire, being a Teacher, English as a foreign language, being a physician, only multiple houses, being the Community education officer of [employer].  He continues to demand that we contact the police department and discussed with him that he is the one who is correct with all of the events that have happened and that we will back him up on his story about why he drove the car into the police department.  He does state today that the reason he did not take an increased dose of the Depakote is because he did not know the exact milligrams nor the exact time that he was going to take it but then agrees to take an additional dose of Depakote as well as we tell him how many milligrams it is and exactly what time he is going to be taking it every day.  Principal Problem: Bipolar I disorder, current or most recent episode manic, with psychotic features (HCC) Diagnosis: Principal Problem:   Bipolar I disorder, current or most recent episode manic, with psychotic features (HCC) Active Problems:   Affective psychosis, bipolar (HCC)   Bipolar disorder (HCC)  Total Time spent with patient: 30 minutes  Past Psychiatric History: Patient has a long history of bipolar disorder.  Looks like he has had some pretty extended periods of reasonably appropriate behavior but decompensated this year after a period of being off his medicine  Past Medical History:  Past Medical History:  Diagnosis Date  . Bipolar disorder (HCC)   . Coronary artery disease   . GERD (gastroesophageal reflux disease)   . History of multiple strokes   . PTSD (post-traumatic stress disorder)     Past Surgical History:  Procedure Laterality Date  . CARDIAC CATHETERIZATION     Family History: History reviewed. No pertinent family  history. Family Psychiatric  History: See previous Social History:  Social History   Substance and Sexual Activity  Alcohol Use No     Social History   Substance and Sexual Activity  Drug Use Never    Social History   Socioeconomic History  . Marital status: Legally Separated    Spouse name: Not on file  . Number of children: Not on file  . Years of education: Not on file  . Highest education level: Not on file  Occupational History  . Not on file  Social Needs  . Financial resource strain: Not on file  . Food insecurity    Worry: Not on file    Inability: Not on file  . Transportation needs    Medical: Not on file    Non-medical: Not on file  Tobacco Use  . Smoking status: Never Smoker  . Smokeless tobacco: Never Used  Substance and Sexual Activity  . Alcohol use: No  . Drug use: Never  . Sexual activity: Not on file  Lifestyle  . Physical activity    Days per week: Not on file    Minutes per session: Not on file  . Stress: Not on file  Relationships  . Social Musician on phone: Not on file    Gets together: Not on file    Attends religious service: Not on file    Active member of club or organization: Not on file    Attends  meetings of clubs or organizations: Not on file    Relationship status: Not on file  Other Topics Concern  . Not on file  Social History Narrative  . Not on file   Additional Social History:    Pain Medications: see PTA Prescriptions: see PTA Over the Counter: see PTA History of alcohol / drug use?: No history of alcohol / drug abuse                    Sleep: Good  Appetite:  Good  Current Medications: Current Facility-Administered Medications  Medication Dose Route Frequency Provider Last Rate Last Dose  . acetaminophen (TYLENOL) tablet 650 mg  650 mg Oral Q6H PRN Malvin JohnsFarah, Brian, MD   650 mg at 01/07/19 2053  . alum & mag hydroxide-simeth (MAALOX/MYLANTA) 200-200-20 MG/5ML suspension 30 mL  30 mL Oral Q4H  PRN Malvin JohnsFarah, Brian, MD      . clopidogrel (PLAVIX) tablet 75 mg  75 mg Oral Daily Malvin JohnsFarah, Brian, MD   75 mg at 01/09/19 0917  . divalproex (DEPAKOTE) DR tablet 1,000 mg  1,000 mg Oral QPM Clapacs, Jackquline DenmarkJohn T, MD   1,000 mg at 01/08/19 1708  . divalproex (DEPAKOTE) DR tablet 500 mg  500 mg Oral q morning - 10a Ulla Mckiernan, Gerlene Burdockravis B, FNP      . hydrOXYzine (ATARAX/VISTARIL) tablet 50 mg  50 mg Oral TID PRN Malvin JohnsFarah, Brian, MD   50 mg at 01/08/19 0906  . lamoTRIgine (LAMICTAL) tablet 50 mg  50 mg Oral Daily Clapacs, Jackquline DenmarkJohn T, MD   50 mg at 01/09/19 0916  . LORazepam (ATIVAN) injection 2 mg  2 mg Intramuscular Q6H PRN Clapacs, Jackquline DenmarkJohn T, MD   2 mg at 01/08/19 2059  . magnesium hydroxide (MILK OF MAGNESIA) suspension 30 mL  30 mL Oral Daily PRN Malvin JohnsFarah, Brian, MD      . metoprolol succinate (TOPROL-XL) 24 hr tablet 50 mg  50 mg Oral Daily Malvin JohnsFarah, Brian, MD   50 mg at 01/09/19 0917  . OLANZapine zydis (ZYPREXA) disintegrating tablet 10 mg  10 mg Oral Q8H PRN Antonieta Pertlary, Greg Lawson, MD   10 mg at 01/08/19 1956   And  . ziprasidone (GEODON) injection 20 mg  20 mg Intramuscular PRN Antonieta Pertlary, Greg Lawson, MD      . QUEtiapine (SEROQUEL) tablet 800 mg  800 mg Oral QHS Clapacs, Jackquline DenmarkJohn T, MD   800 mg at 01/08/19 1956  . tamsulosin (FLOMAX) capsule 0.4 mg  0.4 mg Oral QPC supper Antonieta Pertlary, Greg Lawson, MD   0.4 mg at 01/08/19 1708    Lab Results: No results found for this or any previous visit (from the past 48 hour(s)).  Blood Alcohol level:  Lab Results  Component Value Date   ETH <10 12/16/2018   ETH <10 10/29/2017    Metabolic Disorder Labs: Lab Results  Component Value Date   HGBA1C 5.8 (H) 12/25/2018   MPG 119.76 12/25/2018   MPG 111 11/03/2017   No results found for: PROLACTIN Lab Results  Component Value Date   CHOL 133 11/03/2017   TRIG 148 11/03/2017   HDL 44 11/03/2017   CHOLHDL 3.0 11/03/2017   VLDL 30 11/03/2017   LDLCALC 59 11/03/2017   LDLCALC 39 05/10/2015    Physical Findings: AIMS:  , ,  ,  ,    CIWA:     COWS:     Musculoskeletal: Strength & Muscle Tone: within normal limits Gait & Station: normal Patient leans: N/A  Psychiatric  Specialty Exam: Physical Exam  Nursing note and vitals reviewed. Constitutional: He appears well-developed and well-nourished.  Cardiovascular: Normal rate.  Respiratory: Effort normal.  Musculoskeletal: Normal range of motion.  Neurological: He is alert.  Skin: Skin is warm.  Psychiatric: His speech is rapid and/or pressured and tangential. Thought content is paranoid and delusional. He expresses impulsivity and inappropriate judgment.    Review of Systems  Constitutional: Negative.   HENT: Negative.   Eyes: Negative.   Respiratory: Negative.   Cardiovascular: Negative.   Gastrointestinal: Negative.   Genitourinary: Negative.   Musculoskeletal: Negative.   Skin: Negative.   Neurological: Negative.   Endo/Heme/Allergies: Negative.     Blood pressure 121/70, pulse 69, temperature 98.1 F (36.7 C), temperature source Oral, resp. rate 17, height  (1.778 m), weight 76 kg, SpO2 100 %.Body mass index is 24.04 kg/m.  General Appearance: Disheveled  Eye Contact:  Good  Speech:  Pressured  Volume:  Normal  Mood:  Irritable  Affect:  Inappropriate and Labile  Thought Process:  Disorganized  Orientation:  Full (Time, Place, and Person)  Thought Content:  Illogical, Delusions, Ideas of Reference:   Paranoia Delusions, Paranoid Ideation, Rumination and Tangential  Suicidal Thoughts:  No  Homicidal Thoughts:  No  Memory:  Immediate;   Fair Recent;   Fair Remote;   Fair  Judgement:  Impaired  Insight:  Lacking  Psychomotor Activity:  Increased  Concentration:  Concentration: Poor  Recall:  [employer] of Knowledge:  Fair  Language:  Fair  Akathisia:  No  Handed:  Right  AIMS (if indicated):     Assets:  Engineer, maintenance Physical Health Social Support  ADL's:  Impaired  Cognition:  Impaired,  Mild  Sleep:  Number of Hours: 5    Assessment: Patient presents walking down the hallway and is pleasant, calm, cooperative at the time.  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  Manic presentation with severe delusions, as well as pressured speech.  He has agreed to take an additional dose of Depakote which has been ordered we will continue to monitor patient for improvement with additional medications patient's valproic acid level on 01/05/2019 was 63.  Patient continues to agree and admit that he does have a mental health disorder and is understanding that he needs to take medications as able to discuss his medications from the past, however, he is tangential and has to be redirected frequently to continue discussing improving his mental health so that he is able to discharge from the hospital.  Treatment Plan Summary: Daily contact with patient to assess and evaluate symptoms and progress in treatment and Medication management Continue Plavix 75 mg p.o. daily Continue Depakote DR 1000 mg p.o. every evening: 1 disorder Every morning with first dose now for bipolar 1 disorder Continue Vistaril 50 mg p.o. 3 times daily as needed for anxiety Continue Lamictal 50 mg p.o. daily for bipolar 1 disorder Continue Ativan 2 mg IM every 6 hours as needed for anxiety Continue Seroquel 800 mg p.o. nightly for bipolar 1 disorder Continue agitation protocol of Zyprexa Zydis 10 mg every 8 hours as needed and Geodon 20 mg IM as needed for agitation Encourage group  therapy participation 20-minute safety checks Continue reality based therapy  Lewis Shock, FNP 01/09/2019, 2:06 PM

## 2019-01-09 NOTE — BHH Counselor (Signed)
CSW called [facility] to complete the phone screening and confirm receipt of the fax.   CSW spoke with Gae Bon who confirms the fax was received, however, reports that she "has a patient in front of me" and she was unable to complete the interview.  Gae Bon reports that she will return this CSW call in 30 minutes to complete the interview.  [name], MSW, LCSW 01/09/2019 11:12 AM

## 2019-01-10 MED ORDER — LAMOTRIGINE 100 MG PO TABS
100.0000 mg | ORAL_TABLET | Freq: Every day | ORAL | Status: DC
  Administered 2019-01-11 – 2019-01-13 (×3): 100 mg via ORAL
  Filled 2019-01-10 (×3): qty 1

## 2019-01-10 NOTE — BHH Group Notes (Signed)
  LCSW Group Therapy Note  01/10/2019 11:38 AM   Type of Therapy/Topic:  Group Therapy:  Feelings about Diagnosis  Participation Level:  Did Not Attend   Description of Group:   This group will allow patients to explore their thoughts and feelings about diagnoses they have received. Patients will be guided to explore their level of understanding and acceptance of these diagnoses. Facilitator will encourage patients to process their thoughts and feelings about the reactions of others to their diagnosis and will guide patients in identifying ways to discuss their diagnosis with significant others in their lives. This group will be process-oriented, with patients participating in exploration of their own experiences, giving and receiving support, and processing challenge from other group members.   Therapeutic Goals: 1. Patient will demonstrate understanding of diagnosis as evidenced by identifying two or more symptoms of the disorder 2. Patient will be able to express two feelings regarding the diagnosis 3. Patient will demonstrate their ability to communicate their needs through discussion and/or role play  Summary of Patient Progress: x   Therapeutic Modalities:   Cognitive Behavioral Therapy Brief Therapy Feelings Identification    Evalina Field, MSW, LCSW Clinical Social Work 01/10/2019 11:38 AM

## 2019-01-10 NOTE — BHH Counselor (Signed)
CSW called [facility] in an effort to follow up with Austin Rhodes from phone calls made on 01/09/2019.  CSW was informed that Austin Rhodes was off and CSW spoke with Austin Rhodes instead.  CSW provided Austin Rhodes with the rationale provided by Nurse Demetria to this CSW on 01/09/2019 in regards to patient having a walker.  CSW reported that the walker is likely left over from when patient had a fall in November and was unsteady on his feet.  CSW followed up on if the second set of labs sent to [facility] was legible as CSW was asked to send twice on 01/09/2019. CSW was informed that it was NOT legiible.  CSW pointed out that it was legible on CSW's end.  CSW was asked to reprint and resend the information.  CSW did and received confirmation the fax was successful.   [name], MSW, LCSW 01/10/2019 1:35 PM

## 2019-01-10 NOTE — Progress Notes (Signed)
Patient has come up to almost every staff member on the unit asking that we call the [facility] and tell them that "the whistle blower has an announcement to make". Patient continues to also state that he has ordered "one million masks" that say "[place], and that every person that steps onto these premises has to wear a mask".

## 2019-01-10 NOTE — BHH Counselor (Signed)
CSW received return phone call from Alex at [facility] who reports that the new fax is still too difficult for the staff at [facility] to read.   He reports that since labs were sent and the labs aren't able to be read that they can not place the patient on the waitlist and "we are stuck at this time".   CSW asked if there is an alternative to send the results, perhaps by email and was informed "we don't really use that here".  Gwyndolyn Saxon asked if labs can be printed on a plan white sheet and CSW stated that to her knowledge that was not possible.  CSW asked if it would be possible for the psychiatrist to provide some documentation about labs, noting any possible concerns and was informed that a medical doctor would have to do it and not the psychiatrist and Gwyndolyn Saxon is not sure that this would be accepted either.  Gwyndolyn Saxon pointed out that "the problem here is that because the labs were sent and they can't be read we can't accept because we don't know if he is medically cleared, we have in the past accepted people and put them on the waitlist without the labs being sent, the problem here is that labs were sent and they can't be read".  Gwyndolyn Saxon reports that he will touch base with his manager and call this CSW back.  [name], MSW, LCSW 01/10/2019 1:41 PM

## 2019-01-10 NOTE — BHH Counselor (Signed)
CSW called Leveda Anna, [phone] one of the managers of [employer] for additional support.    CSW was unable to speak with her and left HIPAA compliant voicemail.  [name], MSW, LCSW 01/10/2019 1:42 PM

## 2019-01-10 NOTE — Tx Team (Signed)
Interdisciplinary Treatment and Diagnostic Plan Update  01/10/2019 Time of Session: 830am Austin Rhodes MRN: [id number]  Principal Diagnosis: Bipolar I disorder, current or most recent episode manic, with psychotic features ([place])  Secondary Diagnoses: Principal Problem:   Bipolar I disorder, current or most recent episode manic, with psychotic features ([place]) Active Problems:   Affective psychosis, bipolar ([place])   Bipolar disorder ([place])   Current Medications:  Current Facility-Administered Medications  Medication Dose Route Frequency Provider Last Rate Last Dose  . acetaminophen (TYLENOL) tablet 650 mg  650 mg Oral Q6H PRN Johnn Hai, MD   650 mg at 01/09/19 1702  . alum & mag hydroxide-simeth (MAALOX/MYLANTA) 200-200-20 MG/5ML suspension 30 mL  30 mL Oral Q4H PRN Johnn Hai, MD      . clopidogrel (PLAVIX) tablet 75 mg  75 mg Oral Daily Johnn Hai, MD   75 mg at 01/09/19 0917  . divalproex (DEPAKOTE) DR tablet 1,000 mg  1,000 mg Oral QPM Clapacs, Madie Reno, MD   1,000 mg at 01/09/19 1702  . divalproex (DEPAKOTE) DR tablet 500 mg  500 mg Oral q morning - 10a Money, Darnelle Maffucci B, FNP   500 mg at 01/09/19 1425  . hydrOXYzine (ATARAX/VISTARIL) tablet 50 mg  50 mg Oral TID PRN Johnn Hai, MD   50 mg at 01/08/19 0906  . lamoTRIgine (LAMICTAL) tablet 50 mg  50 mg Oral Daily Clapacs, Madie Reno, MD   50 mg at 01/09/19 0916  . LORazepam (ATIVAN) injection 2 mg  2 mg Intramuscular Q6H PRN Clapacs, Madie Reno, MD   2 mg at 01/08/19 2059  . magnesium hydroxide (MILK OF MAGNESIA) suspension 30 mL  30 mL Oral Daily PRN Johnn Hai, MD      . metoprolol succinate (TOPROL-XL) 24 hr tablet 50 mg  50 mg Oral Daily Johnn Hai, MD   50 mg at 01/09/19 0917  . OLANZapine zydis (ZYPREXA) disintegrating tablet 10 mg  10 mg Oral Q8H PRN Sharma Covert, MD   10 mg at 01/08/19 1956   And  . ziprasidone (GEODON) injection 20 mg  20 mg Intramuscular PRN Sharma Covert, MD      . QUEtiapine (SEROQUEL) tablet  800 mg  800 mg Oral QHS Clapacs, Madie Reno, MD   800 mg at 01/09/19 2148  . tamsulosin (FLOMAX) capsule 0.4 mg  0.4 mg Oral QPC supper Sharma Covert, MD   0.4 mg at 01/08/19 1708   PTA Medications: Medications Prior to Admission  Medication Sig Dispense Refill Last Dose  . acetaminophen (TYLENOL) 325 MG tablet Take 2 tablets (650 mg total) by mouth every 6 (six) hours as needed for mild pain or moderate pain. 30 tablet 0   . alum & mag hydroxide-simeth (MAALOX/MYLANTA) 200-200-20 MG/5ML suspension Take 30 mLs by mouth every 4 (four) hours as needed for indigestion. 355 mL 0   . B Complex-Biotin-FA (B COMPLETE PO) Take 1 tablet by mouth daily.     . benztropine (COGENTIN) 1 MG tablet Take 1 tablet (1 mg total) by mouth 2 (two) times daily.     . clopidogrel (PLAVIX) 75 MG tablet Take 1 tablet (75 mg total) by mouth daily. 30 tablet 3   . divalproex (DEPAKOTE) 250 MG DR tablet Take 250 mg by mouth 2 (two) times daily.     Marland Kitchen lamoTRIgine (LAMICTAL) 200 MG tablet Take 1 tablet (200 mg total) by mouth daily. 30 tablet 1   . magnesium hydroxide (MILK OF MAGNESIA) 400 MG/5ML  suspension Take 30 mLs by mouth daily as needed for mild constipation. 355 mL 0   . metoprolol succinate (TOPROL-XL) 25 MG 24 hr tablet Take 1 tablet (25 mg total) by mouth daily. 30 tablet 1   . Multiple Vitamin (MULTIVITAMIN WITH MINERALS) TABS tablet Take 1 tablet by mouth daily.     Marland Kitchen perphenazine (TRILAFON) 4 MG tablet Take 1 tablet (4 mg total) by mouth 3 (three) times daily.     . QUEtiapine (SEROQUEL XR) 200 MG 24 hr tablet Take 200 mg by mouth at bedtime.     . rosuvastatin (CRESTOR) 10 MG tablet Take 1 tablet (10 mg total) by mouth daily. 30 tablet 1   . thiamine (B-1) 100 MG/ML injection Inject 1 mL (100 mg total) into the vein daily. 25 mL      Patient Stressors: Health problems Medication change or noncompliance  Patient Strengths: Curator fund of knowledge Supportive  family/friends  Treatment Modalities: Medication Management, Group therapy, Case management,  1 to 1 session with clinician, Psychoeducation, Recreational therapy.   Physician Treatment Plan for Primary Diagnosis: Bipolar I disorder, current or most recent episode manic, with psychotic features ([place]) Long Term Goal(s):     Short Term Goals:    Medication Management: Evaluate patient's response, side effects, and tolerance of medication regimen.  Therapeutic Interventions: 1 to 1 sessions, Unit Group sessions and Medication administration.  Evaluation of Outcomes: Not Met  Physician Treatment Plan for Secondary Diagnosis: Principal Problem:   Bipolar I disorder, current or most recent episode manic, with psychotic features ([place]) Active Problems:   Affective psychosis, bipolar ([place])   Bipolar disorder ([place])  Long Term Goal(s):     Short Term Goals:       Medication Management: Evaluate patient's response, side effects, and tolerance of medication regimen.  Therapeutic Interventions: 1 to 1 sessions, Unit Group sessions and Medication administration.  Evaluation of Outcomes: Not Met   RN Treatment Plan for Primary Diagnosis: Bipolar I disorder, current or most recent episode manic, with psychotic features ([place]) Long Term Goal(s): Knowledge of disease and therapeutic regimen to maintain health will improve  Short Term Goals: Ability to remain free from injury will improve, Ability to demonstrate self-control, Ability to participate in decision making will improve and Compliance with prescribed medications will improve  Medication Management: RN will administer medications as ordered by provider, will assess and evaluate patient's response and provide education to patient for prescribed medication. RN will report any adverse and/or side effects to prescribing provider.  Therapeutic Interventions: 1 on 1 counseling sessions, Psychoeducation, Medication administration, Evaluate  responses to treatment, Monitor vital signs and CBGs as ordered, Perform/monitor CIWA, COWS, AIMS and Fall Risk screenings as ordered, Perform wound care treatments as ordered.  Evaluation of Outcomes: Not Met   LCSW Treatment Plan for Primary Diagnosis: Bipolar I disorder, current or most recent episode manic, with psychotic features ([place]) Long Term Goal(s): Safe transition to appropriate next level of care at discharge, Engage patient in therapeutic group addressing interpersonal concerns.  Short Term Goals: Engage patient in aftercare planning with referrals and resources, Facilitate acceptance of mental health diagnosis and concerns, Identify triggers associated with mental health/substance abuse issues and Increase skills for wellness and recovery  Therapeutic Interventions: Assess for all discharge needs, 1 to 1 time with Social worker, Explore available resources and support systems, Assess for adequacy in community support network, Educate family and significant other(s) on suicide prevention, Complete Psychosocial Assessment, Interpersonal group therapy.  Evaluation of Outcomes: Not Met   Progress in Treatment: Attending groups: No. Participating in groups: No. Taking medication as prescribed: No. Toleration medication: Yes. Family/Significant other contact made: Yes, individual(s) contacted:  Tyler Aas, daughter Patient understands diagnosis: No. Discussing patient identified problems/goals with staff: Yes. Medical problems stabilized or resolved: Yes. Denies suicidal/homicidal ideation: Yes. Issues/concerns per patient self-inventory: No. Other: N/A  New problem(s) identified: No, Describe:  none  New Short Term/Long Term Goal(s): Detox, elimination of AVH/symptoms of psychosis, medication management for mood stabilization; elimination of SI thoughts; development of comprehensive mental wellness/sobriety plan.   Patient Goals:  "Put Trump in jail"  Discharge Plan or  Barriers: SPE pamphlet, Mobile Crisis information, and AA/NA information provided to patient for additional community support and resources. CSW assessing for appropriate referrals. Update 12/29/18-pt still displaying delusions and manic behavior. Discharge plan TBD. Update 01/03/19-pt still displaying delusions and manic behavior. Pt has been refusing some of his medication. Discharge plan TBD. Update 01/10/19 - pt has made little to none progress. Referral for [facility] sent 01/09/19.  Reason for Continuation of Hospitalization: Delusions  Mania Medication stabilization  Estimated Length of Stay: TBD  Recreational Therapy: Patient Stressors: N/A  Patient Goal: Patient will focus on task/topic with 2 prompts from staff within 5 recreation therapy group  Sessions Attendees: Patient: 12/24/2018 9:14 AM  Physician: Alethia Berthold 12/24/2018 9:14 AM  Nursing:  12/24/2018 9:14 AM  RN Care Manager: 12/24/2018 9:14 AM  Social Worker:  Minette Brine Kahlie Deutscher LCSW 12/24/2018 9:14 AM  Recreational Therapist:  12/24/2018 9:14 AM  Other:  12/24/2018 9:14 AM  Other:  12/24/2018 9:14 AM  Other: 12/24/2018 9:14 AM        Scribe for Treatment Team: Mariann Laster Junior Kenedy, LCSW 01/10/2019 10:08 AM

## 2019-01-10 NOTE — BHH Counselor (Signed)
CSW received confirmation from [place] at [facility] that patient is wait listed.  [name], MSW, LCSW 01/10/2019 3:14 PM

## 2019-01-10 NOTE — Progress Notes (Signed)
[facility] MD Progress Note  01/10/2019 12:15 PM Austin Rhodes  MRN:  [id number]   Subjective: We will follow-up on a patient diagnosed with bipolar manic episode. Patient continues to endorse ongoing delusions. Discussed with patient fiction vs facts and the level of difficulty it is requiring for our staff to decipher. As per patient " grandiosity are ok its not bad when I have them." On today's evaluation patient reports having 37 doctoral degrees from different countries. He continues to ruminate about contacting the [facility] Police department to come pick him up to take him home since they brought him ot the hospital. 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  He reports that the car he ran into the building was his personal FBI car. He reports compliance with the Depakote, as it has been effective in the past. " I am trying to walk and decrease any unwanted effects to include a shuffling gait and tremors that I get when I take Depakote." He continues to report compliance with taking Depakote. Patient requested 12 sheets of paper, in which he uses to communicate with staff and providers. Today he slid a paper under the providers off that read " Whenever you can talk about an emergency with "the proud boys" and super phosphorous explosives. He denies any si/hi/avh.    Principal Problem: Bipolar I disorder, current or most recent episode manic, with psychotic features ([facility]) Diagnosis: Principal Problem:   Bipolar I disorder, current or most recent episode manic, with psychotic features ([facility]) Active Problems:   Affective psychosis, bipolar ([facility])   Bipolar disorder ([facility])  Total Time spent with patient: 30 minutes  Past Psychiatric History: Patient has a long history of bipolar disorder.  Looks like he has had some pretty extended periods of reasonably appropriate behavior but decompensated this year after a period of being off his  medicine  Past Medical History:  Past Medical History:  Diagnosis Date  . Bipolar disorder (H[facility])   . Coronary artery disease   . GERD (gastroesophageal reflux disease)   . History of multiple strokes   . PTSD (post-traumatic stress disorder)     Past Surgical History:  Procedure Laterality Date  . CARDIAC CATHETERIZATION     Family History: History reviewed. No pertinent family history. Family Psychiatric  History: See previous Social History:  Social History   Substance and Sexual Activity  Alcohol Use No     Social History   Substance and Sexual Activity  Drug Use Never    Social History   Socioeconomic History  . Marital status: Legally Separated    Spouse name: Not on file  . Number of children: Not on file  . Years of education: Not on file  . Highest education level: Not on file  Occupational History  . Not on file  Social Needs  . Financial resource strain: Not on file  . Food insecurity    Worry: Not on file    Inability: Not on file  . Transportation needs    Medical: Not on file    Non-medical: Not on file  Tobacco Use  . Smoking status: Never Smoker  . Smokeless tobacco: Never Used  Substance and Sexual Activity  . Alcohol use: No  . Drug use: Never  . Sexual activity: Not on file  Lifestyle  . Physical activity    Days per week: Not on file    Minutes per session: Not  on file  . Stress: Not on file  Relationships  . Social Musicianconnections    Talks on phone: Not on file    Gets together: Not on file    Attends religious service: Not on file    Active member of club or organization: Not on file    Attends meetings of clubs or organizations: Not on file    Relationship status: Not on file  Other Topics Concern  . Not on file  Social History Narrative  . Not on file   Additional Social History:    Pain Medications: see PTA Prescriptions: see PTA Over the Counter: see PTA History of alcohol / drug use?: No history of alcohol / drug  abuse                    Sleep: Good  Appetite:  Good  Current Medications: Current Facility-Administered Medications  Medication Dose Route Frequency Provider Last Rate Last Dose  . acetaminophen (TYLENOL) tablet 650 mg  650 mg Oral Q6H PRN Malvin JohnsFarah, Brian, MD   650 mg at 01/09/19 1702  . alum & mag hydroxide-simeth (MAALOX/MYLANTA) 200-200-20 MG/5ML suspension 30 mL  30 mL Oral Q4H PRN Malvin JohnsFarah, Brian, MD      . clopidogrel (PLAVIX) tablet 75 mg  75 mg Oral Daily Malvin JohnsFarah, Brian, MD   75 mg at 01/10/19 1025  . divalproex (DEPAKOTE) DR tablet 1,000 mg  1,000 mg Oral QPM Clapacs, Jackquline DenmarkJohn T, MD   1,000 mg at 01/09/19 1702  . divalproex (DEPAKOTE) DR tablet 500 mg  500 mg Oral q morning - 10a Money, Feliz Beamravis B, FNP   500 mg at 01/10/19 1030  . hydrOXYzine (ATARAX/VISTARIL) tablet 50 mg  50 mg Oral TID PRN Malvin JohnsFarah, Brian, MD   50 mg at 01/08/19 0906  . lamoTRIgine (LAMICTAL) tablet 50 mg  50 mg Oral Daily Clapacs, Jackquline DenmarkJohn T, MD   50 mg at 01/10/19 1023  . LORazepam (ATIVAN) injection 2 mg  2 mg Intramuscular Q6H PRN Clapacs, Jackquline DenmarkJohn T, MD   2 mg at 01/08/19 2059  . magnesium hydroxide (MILK OF MAGNESIA) suspension 30 mL  30 mL Oral Daily PRN Malvin JohnsFarah, Brian, MD      . metoprolol succinate (TOPROL-XL) 24 hr tablet 50 mg  50 mg Oral Daily Malvin JohnsFarah, Brian, MD   50 mg at 01/10/19 1024  . OLANZapine zydis (ZYPREXA) disintegrating tablet 10 mg  10 mg Oral Q8H PRN Antonieta Pertlary, Greg Lawson, MD   10 mg at 01/08/19 1956   And  . ziprasidone (GEODON) injection 20 mg  20 mg Intramuscular PRN Antonieta Pertlary, Greg Lawson, MD      . QUEtiapine (SEROQUEL) tablet 800 mg  800 mg Oral QHS Clapacs, Jackquline DenmarkJohn T, MD   800 mg at 01/09/19 2148  . tamsulosin (FLOMAX) capsule 0.4 mg  0.4 mg Oral QPC supper Antonieta Pertlary, Greg Lawson, MD   0.4 mg at 01/08/19 1708    Lab Results: No results found for this or any previous visit (from the past 48 hour(s)).  Blood Alcohol level:  Lab Results  Component Value Date   ETH <10 12/16/2018   ETH <10 10/29/2017     Metabolic Disorder Labs: Lab Results  Component Value Date   HGBA1C 5.8 (H) 12/25/2018   MPG 119.76 12/25/2018   MPG 111 11/03/2017   No results found for: PROLACTIN Lab Results  Component Value Date   CHOL 133 11/03/2017   TRIG 148 11/03/2017   HDL 44 11/03/2017   CHOLHDL  3.0 11/03/2017   VLDL 30 11/03/2017   LDLCALC 59 11/03/2017   LDLCALC 39 05/10/2015    Physical Findings: AIMS:  , ,  ,  ,    CIWA:    COWS:     Musculoskeletal: Strength & Muscle Tone: within normal limits Gait & Station: normal Patient leans: N/A  Psychiatric Specialty Exam: Physical Exam  Nursing note and vitals reviewed. Constitutional: He appears well-developed and well-nourished.  Cardiovascular: Normal rate.  Respiratory: Effort normal.  Musculoskeletal: Normal range of motion.  Neurological: He is alert.  Skin: Skin is warm.  Psychiatric: His speech is rapid and/or pressured and tangential. Thought content is paranoid and delusional. He expresses impulsivity and inappropriate judgment.    Review of Systems  Constitutional: Negative.   HENT: Negative.   Eyes: Negative.   Respiratory: Negative.   Cardiovascular: Negative.   Gastrointestinal: Negative.   Genitourinary: Negative.   Musculoskeletal: Negative.   Skin: Negative.   Neurological: Negative.   Endo/Heme/Allergies: Negative.     Blood pressure 122/83, pulse 79, temperature 97.7 F (36.5 C), temperature source Oral, resp. rate 18, height 5\' 10"  (1.778 m), weight 76 kg, SpO2 100 %.Body mass index is 24.04 kg/m.  General Appearance: Disheveled  Eye Contact:  Good  Speech:  Pressured  Volume:  Normal  Mood:  Irritable  Affect:  Inappropriate and Labile  Thought Process:  Disorganized  Orientation:  Full (Time, Place, and Person)  Thought Content:  Illogical, Delusions, Ideas of Reference:   Paranoia Delusions, Paranoid Ideation, Rumination and Tangential  Suicidal Thoughts:  No  Homicidal Thoughts:  No  Memory:   Immediate;   Fair Recent;   Fair Remote;   Fair  Judgement:  Impaired  Insight:  Lacking  Psychomotor Activity:  Increased  Concentration:  Concentration: Poor  Recall:  of Knowledge:  Fair  Language:  Fair  Akathisia:  No  Handed:  Right  AIMS (if indicated):     Assets:  [employer] Social Support  ADL's:  Impaired  Cognition:  Impaired,  Mild  Sleep:  Number of Hours: 6   Assessment: Patient presents walking down the hallway and is pleasant, calm, cooperative at the time.  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  Manic presentation with severe delusions, as well as pressured speech.  He has agreed to take an additional dose of Depakote which has been ordered we will continue to monitor patient for improvement with additional medications patient's valproic acid level on 01/05/2019 was 63.  Patient continues to agree and admit that he does have a mental health disorder and is understanding that he needs to take medications as able to discuss his medications from the past, however, he is tangential and has to be redirected frequently to continue discussing improving his mental health so that he is able to discharge from the hospital.  Treatment Plan Summary: No new changes will be made at this time. WIll continue current plans as of 01/10/2019.  Daily contact with patient to assess and evaluate symptoms and progress in treatment and Medication management Continue Plavix 75 mg p.o. daily Continue Depakote DR 1000 mg p.o. every evening: 1 disorder Every morning  with first dose now for bipolar 1 disorder Continue Vistaril 50 mg p.o. 3 times daily as needed for  anxiety Continue Lamictal 50 mg p.o. daily for bipolar 1 disorder Continue Ativan 2 mg IM every 6 hours as needed for anxiety Continue Seroquel 800 mg p.o. nightly for bipolar 1 disorder Continue agitation protocol of Zyprexa Zydis 10 mg every 8 hours as needed and Geodon 20 mg IM as needed for agitation Encourage group therapy participation 20-minute safety checks Continue reality based therapy  Suella Broad, FNP 01/10/2019, 12:15 PM

## 2019-01-10 NOTE — Plan of Care (Signed)
D- Patient alert and oriented. Patient presented in a preoccupied, but pleasant mood on assessment, continuing to come to the nurses station every so often slipping notes under the door for staff to read and make copies to send to [facility]. Patient reported depression and anxiety on his self-inventory, rating them both a "2/10", however, he did not endorse any of this to this Probation officer. Patient denied SI, HI, AVH, and pain at this time. Patient's goal for today is to "have the staff call", in which patient has been talking constantly about calling the sheriff or police department to come and pick him up to transfer him to his other doctor.  A- Scheduled medications administered to patient, per MD orders. Support and encouragement provided.  Routine safety checks conducted every 15 minutes.  Patient informed to notify staff with problems or concerns.  R- No adverse drug reactions noted. Patient contracts for safety at this time. Patient compliant with medications and treatment plan. Patient receptive, calm, and cooperative. Patient interacts well with others on the unit.  Patient remains safe at this time.  Problem: Education: Goal: Knowledge of [place] [facility] Education information/materials will improve Outcome: Not Progressing Goal: Emotional status will improve Outcome: Not Progressing Goal: Mental status will improve Outcome: Not Progressing Goal: Verbalization of understanding the information provided will improve Outcome: Not Progressing   Problem: Safety: Goal: Periods of time without injury will increase Outcome: Not Progressing   Problem: Education: Goal: Will be free of psychotic symptoms Outcome: Not Progressing Goal: Knowledge of the prescribed therapeutic regimen will improve Outcome: Not Progressing   Problem: Coping: Goal: Coping ability will improve Outcome: Not Progressing Goal: Will verbalize feelings Outcome: Not Progressing   Problem: Health Behavior/Discharge  Planning: Goal: Compliance with prescribed medication regimen will improve Outcome: Not Progressing

## 2019-01-10 NOTE — Progress Notes (Signed)
Patient just came up to this writer stating that he has a "formal complaint to make" about one of the MHTs sitting in the community room with him and his daughter. Patient stated that "I have some personal finances that we have to discuss about my sister's estate and I don't want her sitting right up on me. She's nosy and I don't want her in there, tell security to come down there and escort her out of there".

## 2019-01-10 NOTE — BHH Counselor (Signed)
CSW resent fax of labs after lightning the page on the printer.  CSW received confirmation that fax was successful.  CSW printed list of labs that was brighter and the pt's CT results and sent that fax as well.  CSW received confirmation that fax was successful.  CSW called and spoke with [employer]i [employer] reports that he will return phone call once fax has been received.  [name], MSW, LCSW 01/10/2019 2:07 PM

## 2019-01-10 NOTE — Progress Notes (Signed)
Recreation Therapy Notes   Date: 01/10/2019  Time: 9:30 am   Location: Craft room   Behavioral response: N/A   Intervention Topic: Problem Solving  Discussion/Intervention: Patient did not attend group.   Clinical Observations/Feedback:  Patient did not attend group.   Thorsten Climer LRT/CTRS        Orlena Garmon 01/10/2019 11:02 AM

## 2019-01-10 NOTE — BHH Counselor (Signed)
CSW spoke with Austin Rhodes at [facility] who conformed that they were able to read the 4th set of labs sent to [place] should receive a call back once labs are reviewed to determine if pt is on the waitlist.  [name], MSW, LCSW 01/10/2019 2:43 PM

## 2019-01-10 NOTE — Progress Notes (Signed)
Patient ID: Austin Rhodes, male   DOB: [birth year], 68 y.o.   MRN: [id number] Patient came to my office this afternoon asking to speak with me.  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  He insisted that I get on the telephone right that second and call the sheriff so that he could be released from the hospital to go off and begin filing more lawsuits.  After letting him talk for a few minutes I ask him whether he would be willing to listen to me.  He grudgingly consented at which point I started to tell him why I was not going to call the police.  He immediately got agitated and angry stood up and told me that I was fired and would be going to prison and stormed out of the room.  This is just to represent how his mental state continues to be.

## 2019-01-11 MED ORDER — QUETIAPINE FUMARATE 200 MG PO TABS
1000.0000 mg | ORAL_TABLET | Freq: Every day | ORAL | Status: DC
  Administered 2019-01-11 – 2019-01-12 (×2): 1000 mg via ORAL
  Filled 2019-01-11 (×2): qty 5

## 2019-01-11 NOTE — Plan of Care (Signed)
Pt denies anxiety, depression, SI, HI and AVS. Pt was educated on care plan and verbalizes understanding. Collier Bullock RN  Problem: Education: Goal: Freight forwarder Education information/materials will improve Outcome: Not Progressing Goal: Emotional status will improve Outcome: Progressing Goal: Mental status will improve Outcome: Not Progressing Goal: Verbalization of understanding the information provided will improve Outcome: Not Progressing   Problem: Safety: Goal: Periods of time without injury will increase Outcome: Progressing   Problem: Education: Goal: Will be free of psychotic symptoms Outcome: Not Progressing Goal: Knowledge of the prescribed therapeutic regimen will improve Outcome: Progressing   Problem: Coping: Goal: Coping ability will improve Outcome: Progressing Goal: Will verbalize feelings Outcome: Progressing   Problem: Health Behavior/Discharge Planning: Goal: Compliance with prescribed medication regimen will improve Outcome: Progressing

## 2019-01-11 NOTE — Progress Notes (Signed)
   01/10/19 2255  Psych Admission Type (Psych Patients Only)  Admission Status Voluntary  Psychosocial Assessment  Patient Complaints Anxiety;Worrying  Eye Contact Fair  Facial Expression Anxious  Affect Anxious  Speech Pressured;Tangential  Interaction Assertive  Motor Activity Slow  Appearance/Hygiene Unremarkable  Behavior Characteristics Cooperative;Anxious  Mood Anxious  Thought Process  Coherency Disorganized;Tangential;Flight of ideas  Content Delusions  Delusions Grandeur;Paranoid  Perception WDL  Hallucination None reported or observed  Judgment Poor  Confusion None  Danger to Self  Current suicidal ideation? Denies  Danger to Others  Danger to Others None reported or observed   Pt is pleasant and cooperative. Pt paces around unit, talking about how he needs to go because someone is after him. 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 Pt is anxious and says he has been all day. Pt denies SI, HI, AVH. His pain level is an 8/10 and his anxiety level is 9/10. Pt given something for anxiety. Slightly paranoid about his medications, feeling that they are making him tremor and walk slowly "just like his father."

## 2019-01-11 NOTE — Progress Notes (Signed)
Recreation Therapy Notes  Date: 01/11/2019  Time: 9:30 am   Location: Craft room   Behavioral response: N/A   Intervention Topic: Stress  Discussion/Intervention: Patient did not attend group.   Clinical Observations/Feedback:  Patient did not attend group.   Khalen Styer LRT/CTRS        Gearlene Godsil 01/11/2019 11:18 AM

## 2019-01-11 NOTE — [facility] Group Notes (Signed)
LCSW Group Therapy Note  01/11/2019 1:00 PM  Type of Therapy/Topic:  Group Therapy:  Emotion Regulation  Participation Level:  Did Not Attend   Description of Group:   The purpose of this group is to assist patients in learning to regulate negative emotions and experience positive emotions. Patients will be guided to discuss ways in which they have been vulnerable to their negative emotions. These vulnerabilities will be juxtaposed with experiences of positive emotions or situations, and patients will be challenged to use positive emotions to combat negative ones. Special emphasis will be placed on coping with negative emotions in conflict situations, and patients will process healthy conflict resolution skills.  Therapeutic Goals: 1. Patient will identify two positive emotions or experiences to reflect on in order to balance out negative emotions 2. Patient will label two or more emotions that they find the most difficult to experience 3. Patient will demonstrate positive conflict resolution skills through discussion and/or role plays  Summary of Patient Progress: X  Therapeutic Modalities:   Cognitive Behavioral Therapy Feelings Identification Dialectical Behavioral Therapy  Kash Mothershead, MSW, LCSW 01/11/2019 11:42 AM 

## 2019-01-11 NOTE — Progress Notes (Signed)
[facility] MD Progress Note  01/11/2019 1:40 PM Austin Rhodes  MRN:  [id number]   Subjective: We will follow-up on a patient diagnosed with bipolar manic episode.  Patient states today that he is doing good.  He has continued with his delusional thoughts and paranoia.  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  He continues to be demanding about contacting the sheriff's department to come and pick him up and take him home because he has an appointment and has people that he has to meet.  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  Principal Problem: Bipolar I disorder, current or most recent episode manic, with psychotic features (HCC) Diagnosis: Principal Problem:   Bipolar I disorder, current or most recent episode manic, with psychotic features (HCC) Active Problems:   Affective psychosis, bipolar (HCC)   Bipolar disorder (HCC)  Total Time spent with patient: 30 minutes  Past Psychiatric History: Patient has a long history of bipolar disorder.  Looks like he has had some pretty extended periods of reasonably appropriate behavior but decompensated this year after a period of being off his medicine  Past Medical History:  Past Medical History:  Diagnosis Date  . Bipolar disorder (HCC)   . Coronary artery disease   . GERD (gastroesophageal reflux disease)   . History of multiple strokes   . PTSD (post-traumatic stress disorder)     Past Surgical History:  Procedure Laterality Date  . CARDIAC CATHETERIZATION     Family History: History reviewed. No pertinent family history. Family Psychiatric  History: See previous Social History:  Social History   Substance and Sexual Activity  Alcohol Use No      Social History   Substance and Sexual Activity  Drug Use Never    Social History   Socioeconomic History  . Marital status: Legally Separated    Spouse name: Not on file  . Number of children: Not on file  . Years of education: Not on file  . Highest education level: Not on file  Occupational History  . Not on file  Social Needs  . Financial resource strain: Not on file  . Food insecurity    Worry: Not on file    Inability: Not on file  . Transportation needs    Medical: Not on file    Non-medical: Not on file  Tobacco Use  . Smoking status: Never Smoker  . Smokeless tobacco: Never Used  Substance and Sexual Activity  . Alcohol use: No  . Drug use: Never  . Sexual activity: Not on file  Lifestyle  . Physical activity    Days per week: Not on file    Minutes per session: Not on file  . Stress: Not on file  Relationships  . Social Musician on phone: Not on file    Gets together: Not on file    Attends religious service: Not on file    Active member of club or organization: Not on file    Attends meetings of clubs or organizations: Not on file    Relationship status: Not on file  Other Topics Concern  . Not on file  Social History Narrative  . Not on  file   Additional Social History:    Pain Medications: see PTA Prescriptions: see PTA Over the Counter: see PTA History of alcohol / drug use?: No history of alcohol / drug abuse                    Sleep: Good  Appetite:  Good  Current Medications: Current Facility-Administered Medications  Medication Dose Route Frequency Provider Last Rate Last Dose  . acetaminophen (TYLENOL) tablet 650 mg  650 mg Oral Q6H PRN Malvin JohnsFarah, Brian, MD   650 mg at 01/11/19 1114  . alum & mag hydroxide-simeth (MAALOX/MYLANTA) 200-200-20 MG/5ML suspension 30 mL  30 mL Oral Q4H PRN Malvin JohnsFarah, Brian, MD      . clopidogrel (PLAVIX) tablet 75 mg  75 mg Oral Daily Malvin JohnsFarah, Brian, MD   75 mg at 01/11/19 0847  . divalproex  (DEPAKOTE) DR tablet 1,000 mg  1,000 mg Oral QPM Clapacs, Jackquline DenmarkJohn T, MD   1,000 mg at 01/10/19 1722  . divalproex (DEPAKOTE) DR tablet 500 mg  500 mg Oral q morning - 10a Ryder Chesmore, Gerlene Burdockravis B, FNP   500 mg at 01/11/19 0849  . hydrOXYzine (ATARAX/VISTARIL) tablet 50 mg  50 mg Oral TID PRN Malvin JohnsFarah, Brian, MD   50 mg at 01/10/19 1952  . lamoTRIgine (LAMICTAL) tablet 100 mg  100 mg Oral Daily Clapacs, Jackquline DenmarkJohn T, MD   100 mg at 01/11/19 0848  . LORazepam (ATIVAN) injection 2 mg  2 mg Intramuscular Q6H PRN Clapacs, Jackquline DenmarkJohn T, MD   2 mg at 01/08/19 2059  . magnesium hydroxide (MILK OF MAGNESIA) suspension 30 mL  30 mL Oral Daily PRN Malvin JohnsFarah, Brian, MD      . metoprolol succinate (TOPROL-XL) 24 hr tablet 50 mg  50 mg Oral Daily Malvin JohnsFarah, Brian, MD   50 mg at 01/11/19 0848  . OLANZapine zydis (ZYPREXA) disintegrating tablet 10 mg  10 mg Oral Q8H PRN Antonieta Pertlary, Greg Lawson, MD   10 mg at 01/08/19 1956   And  . ziprasidone (GEODON) injection 20 mg  20 mg Intramuscular PRN Antonieta Pertlary, Greg Lawson, MD      . QUEtiapine (SEROQUEL) tablet 1,000 mg  1,000 mg Oral QHS Suki Crockett, Feliz Beamravis B, FNP      . tamsulosin (FLOMAX) capsule 0.4 mg  0.4 mg Oral QPC supper Antonieta Pertlary, Greg Lawson, MD   0.4 mg at 01/08/19 1708    Lab Results: No results found for this or any previous visit (from the past 48 hour(s)).  Blood Alcohol level:  Lab Results  Component Value Date   ETH <10 12/16/2018   ETH <10 10/29/2017    Metabolic Disorder Labs: Lab Results  Component Value Date   HGBA1C 5.8 (H) 12/25/2018   MPG 119.76 12/25/2018   MPG 111 11/03/2017   No results found for: PROLACTIN Lab Results  Component Value Date   CHOL 133 11/03/2017   TRIG 148 11/03/2017   HDL 44 11/03/2017   CHOLHDL 3.0 11/03/2017   VLDL 30 11/03/2017   LDLCALC 59 11/03/2017   LDLCALC 39 05/10/2015    Physical Findings: AIMS:  , ,  ,  ,    CIWA:    COWS:     Musculoskeletal: Strength & Muscle Tone: within normal limits Gait & Station: normal Patient leans:  N/A  Psychiatric Specialty Exam: Physical Exam  Nursing note and vitals reviewed. Constitutional: He appears well-developed and well-nourished.  Cardiovascular: Normal rate.  Respiratory: Effort normal.  Musculoskeletal: Normal range of motion.  Neurological: He  is alert.  Skin: Skin is warm.  Psychiatric: His speech is rapid and/or pressured and tangential. Thought content is paranoid and delusional. He expresses impulsivity and inappropriate judgment.    Review of Systems  Constitutional: Negative.   HENT: Negative.   Eyes: Negative.   Respiratory: Negative.   Cardiovascular: Negative.   Gastrointestinal: Negative.   Genitourinary: Negative.   Musculoskeletal: Negative.   Skin: Negative.   Neurological: Negative.   Endo/Heme/Allergies: Negative.     Blood pressure 132/84, pulse 90, temperature 97.7 F (36.5 C), temperature source Oral, resp. rate 17, height 5\' 10"  (1.778 m), weight 76 kg, SpO2 97 %.Body mass index is 24.04 kg/m.  General Appearance: Disheveled  Eye Contact:  Good  Speech:  Pressured  Volume:  Normal  Mood:  Irritable  Affect:  Inappropriate and Labile  Thought Process:  Disorganized  Orientation:  Full (Time, Place, and Person)  Thought Content:  Illogical, Delusions, Ideas of Reference:   Paranoia Delusions, Paranoid Ideation, Rumination and Tangential  Suicidal Thoughts:  No  Homicidal Thoughts:  No  Memory:  Immediate;   Fair Recent;   Fair Remote;   Fair  Judgement:  Impaired  Insight:  Lacking  Psychomotor Activity:  Increased  Concentration:  Concentration: Poor  Recall:  [employer] of Knowledge:  Fair  Language:  Fair  Akathisia:  No  Handed:  Right  AIMS (if indicated):     Assets:  Chief Executive Officer Physical Health Social Support  ADL's:  Impaired  Cognition:  Impaired,  Mild  Sleep:  Number of Hours: 6   Assessment: Patient presents in his room sitting on the side of the bed and begins a conversation off to be calm  and cooperative and pleasant.  However throughout the evaluation the patient again becomes irritated and very demanding to being discharged from the hospital.  Patient is able to understand he has a mental health issue and that he is taking medications and it is able to discuss medication such as the Seroquel and his Depakote.  He does state that the only medication he refuses to take his lithium because he has had issues with that in the past.  He is not opposed to having increased doses of medications but just needs to know exactly what he is taking.  After discussing with Dr. Weber Cooks, an EKG was ordered and his QTC is 438.  I have ordered a valproic acid level to be drawn on Friday, 01/13/2019 due to the patient taking his Depakote as well as the Lamictal.  After the QTc the patient's Seroquel has been increased to 1000 mg p.o. nightly.  Treatment Plan Summary: Daily contact with patient to assess and evaluate symptoms and progress in treatment and Medication management Continue Plavix 75 mg p.o. daily Continue Depakote DR 1000 mg p.o. every evening: 1 disorder Every morning with first dose now for bipolar 1 disorder Continue Vistaril 50 mg p.o. 3 times daily as needed for anxiety Continue Lamictal 50 mg p.o. daily for bipolar 1 disorder Continue Ativan 2 mg IM every 6 hours as needed for anxiety Increase Seroquel 1000 mg p.o. nightly for bipolar 1 disorder Continue agitation protocol of Zyprexa Zydis 10 mg every 8 hours as needed and Geodon 20 mg IM as needed for agitation EKG ordered Valproic Acid level ordered for 01/13/2019 Encourage group therapy participation Continue 15-minute safety checks Continue reality based therapy  Lewis Shock, FNP 01/11/2019, 1:40 PM

## 2019-01-12 NOTE — Progress Notes (Signed)
D: Patient continues to be delusional and perseverate about whistleblower's syndrome. Asking for sheriff to be called again to pick him up. Told patient he was on [facility] wait list and that he would not be picked up until they had an opening. Then patient asked about seeing an attorney and passed a note under the door referencing whistleblower retaliation. Denies SI. A: Continue to monitor for safety R: Safety maintained.

## 2019-01-12 NOTE — Plan of Care (Signed)
  Problem: Education: Goal: Emotional status will improve Outcome: Progressing Goal: Mental status will improve Outcome: Progressing Goal: Verbalization of understanding the information provided will improve Outcome: Progressing   Problem: Safety: Goal: Periods of time without injury will increase Outcome: Progressing   Problem: Education: Goal: Knowledge of the prescribed therapeutic regimen will improve Outcome: Progressing   Problem: Coping: Goal: Will verbalize feelings Outcome: Progressing   Problem: Health Behavior/Discharge Planning: Goal: Compliance with prescribed medication regimen will improve Outcome: Progressing

## 2019-01-12 NOTE — B[facility] Group Notes (Signed)
[facility] Group Notes:  (Nursing/MHT/Case Management/Adjunct)  Date:  01/12/2019  Time:  8:25 PM  Type of Therapy:  Group Therapy  Participation Level:  Active  Participation Quality:  Appropriate  Affect:  Appropriate  Cognitive:  Alert  Insight:  Good  Engagement in Group:  Supportive  Modes of Intervention:  Clarification  Summary of Progress/Problems:  Austin Rhodes 01/12/2019, 8:25 PM

## 2019-01-12 NOTE — Progress Notes (Signed)
   01/12/19 2023  Psych Admission Type (Psych Patients Only)  Admission Status Voluntary  Psychosocial Assessment  Patient Complaints None  Eye Contact Fair  Facial Expression Anxious  Affect Anxious  Speech Pressured;Tangential  Interaction Assertive;Needy  Motor Activity Slow  Appearance/Hygiene Unremarkable  Behavior Characteristics Cooperative;Anxious  Mood Anxious  Thought Process  Coherency Tangential;Loose associations;Flight of ideas  Content Delusions  Delusions Grandeur;Paranoid  Perception WDL  Hallucination None reported or observed  Judgment Impaired  Confusion None  Danger to Self  Current suicidal ideation? Denies  Danger to Others  Danger to Others None reported or observed   Pt is pleasant. Pt knows that he will likely be discharged tomorrow. Pt talks coherently about his daughter then goes right back into his delusions of being a Youth worker and that people may be after him when he goes back home, even stating that someone told him "he needs to get some security." Pt denies SI, HI and AVH.

## 2019-01-12 NOTE — Plan of Care (Signed)
Pt denies depression, anxiety, SI, HI and AVH. Pt was educated on care plan and verbalizes understanding. Collier Bullock RN Problem: Education: Goal: Knowledge of [facility] Education information/materials will improve Outcome: Progressing Goal: Emotional status will improve Outcome: Progressing Goal: Mental status will improve Outcome: Not Progressing Goal: Verbalization of understanding the information provided will improve Outcome: Progressing   Problem: Safety: Goal: Periods of time without injury will increase Outcome: Progressing   Problem: Education: Goal: Will be free of psychotic symptoms Outcome: Not Progressing Goal: Knowledge of the prescribed therapeutic regimen will improve Outcome: Progressing   Problem: Coping: Goal: Coping ability will improve Outcome: Progressing Goal: Will verbalize feelings Outcome: Progressing   Problem: Health Behavior/Discharge Planning: Goal: Compliance with prescribed medication regimen will improve Outcome: Progressing

## 2019-01-12 NOTE — Progress Notes (Signed)
[facility] MD Progress Note  01/12/2019 3:32 PM Austin Rhodes  MRN:  [id number]   Subjective:Follow-up with patient with bipolar 1 disorder manic episode.  Patient reports today that he feels that he slept much better last night with his increased dose of Seroquel.  He is able to discuss with me about his previous treatments and medications a little more fluently today.  He also is able to state and understand that he does not have vast amounts of Austin Rhodes and that he is on food stamps.  However then patient starts discussing been up whistleblower and demanding to contact the police to come and get him and that he will be leaving today.  However the patient is redirectable.  He tells me that I need to contact his daughter Austin Rhodes and discussed with her the things that have been going on. Patient's daughter, Austin Rhodes, was contacted for collateral information.  She states that she feels that her father has shown some significant improvement.  She states that he has had delusional thoughts for quite some time and they usually are able to disregard them.  She does states that he just became worse and that is why he had to come to the hospital.  She states that she will visit him tonight to assess him for herself to see if she thinks that he is at baseline.  The plan is for the patient to discharge home with his daughter tomorrow if she feels that he is at baseline and ready to return home and get back to his normal schedule.  She also reports that his normal schedule is being at home and has a therapy dog that is named Austin Rhodes and she feels that these are things that he needs to have to help him stabilize a little better.  Principal Problem: Bipolar I disorder, current or most recent episode manic, with psychotic features ([place]) Diagnosis: Principal Problem:   Bipolar I disorder, current or most recent episode manic, with psychotic features ([place]) Active Problems:   Affective psychosis, bipolar ([place])   Bipolar disorder  ([place])  Total Time spent with patient: 20 minutes  Past Psychiatric History: Patient has a long history of bipolar disorder. Looks like he has had some pretty extended periods of reasonably appropriate behavior but decompensated this year after a period of being off his medicine  Past Medical History:  Past Medical History:  Diagnosis Date  . Bipolar disorder ([place])   . Coronary artery disease   . GERD (gastroesophageal reflux disease)   . History of multiple strokes   . PTSD (post-traumatic stress disorder)     Past Surgical History:  Procedure Laterality Date  . CARDIAC CATHETERIZATION     Family History: History reviewed. No pertinent family history. Family Psychiatric  History: See previous Social History:  Social History   Substance and Sexual Activity  Alcohol Use No     Social History   Substance and Sexual Activity  Drug Use Never    Social History   Socioeconomic History  . Marital status: Legally Separated    Spouse name: Not on file  . Number of children: Not on file  . Years of education: Not on file  . Highest education level: Not on file  Occupational History  . Not on file  Tobacco Use  . Smoking status: Never Smoker  . Smokeless tobacco: Never Used  Substance and Sexual Activity  . Alcohol use: No  . Drug use: Never  . Sexual activity: Not on file  Other Topics Concern  . Not on file  Social History Narrative  . Not on file   Social Determinants of Health   Financial Resource Strain:   . Difficulty of Paying Living Expenses: Not on file  Food Insecurity:   . Worried About Programme researcher, broadcasting/film/video in the Last Year: Not on file  . Ran Out of Food in the Last Year: Not on file  Transportation Needs:   . Lack of Transportation (Medical): Not on file  . Lack of Transportation (Non-Medical): Not on file  Physical Activity:   . Days of Exercise per Week: Not on file  . Minutes of Exercise per Session: Not on file  Stress:   . Feeling of Stress :  Not on file  Social Connections:   . Frequency of Communication with Friends and Family: Not on file  . Frequency of Social Gatherings with Friends and Family: Not on file  . Attends Religious Services: Not on file  . Active Member of Clubs or Organizations: Not on file  . Attends Banker Meetings: Not on file  . Marital Status: Not on file   Additional Social History:    Pain Medications: see PTA Prescriptions: see PTA Over the Counter: see PTA History of alcohol / drug use?: No history of alcohol / drug abuse                    Sleep: Good  Appetite:  Good  Current Medications: Current Facility-Administered Medications  Medication Dose Route Frequency Provider Last Rate Last Admin  . acetaminophen (TYLENOL) tablet 650 mg  650 mg Oral Q6H PRN Austin Johns, MD   650 mg at 01/11/19 1716  . alum & mag hydroxide-simeth (MAALOX/MYLANTA) 200-200-20 MG/5ML suspension 30 mL  30 mL Oral Q4H PRN Austin Johns, MD      . clopidogrel (PLAVIX) tablet 75 mg  75 mg Oral Daily Austin Johns, MD   75 mg at 01/12/19 0813  . divalproex (DEPAKOTE) DR tablet 1,000 mg  1,000 mg Oral QPM Austin Rhodes, Austin Denmark, MD   1,000 mg at 01/11/19 1715  . divalproex (DEPAKOTE) DR tablet 500 mg  500 mg Oral q morning - 10a Austin Rhodes, Austin Burdock, Austin Rhodes   500 mg at 01/12/19 2952  . hydrOXYzine (ATARAX/VISTARIL) tablet 50 mg  50 mg Oral TID PRN Austin Johns, MD   50 mg at 01/10/19 1952  . lamoTRIgine (LAMICTAL) tablet 100 mg  100 mg Oral Daily Austin Rhodes, Austin Denmark, MD   100 mg at 01/12/19 0813  . LORazepam (ATIVAN) injection 2 mg  2 mg Intramuscular Q6H PRN Austin Rhodes, Austin Denmark, MD   2 mg at 01/08/19 2059  . magnesium hydroxide (MILK OF MAGNESIA) suspension 30 mL  30 mL Oral Daily PRN Austin Johns, MD      . metoprolol succinate (TOPROL-XL) 24 hr tablet 50 mg  50 mg Oral Daily Austin Johns, MD   50 mg at 01/12/19 0813  . OLANZapine zydis (ZYPREXA) disintegrating tablet 10 mg  10 mg Oral Q8H PRN Antonieta Pert, MD   10 mg  at 01/08/19 1956   And  . ziprasidone (GEODON) injection 20 mg  20 mg Intramuscular PRN Antonieta Pert, MD      . QUEtiapine (SEROQUEL) tablet 1,000 mg  1,000 mg Oral QHS Benetta Maclaren, Austin Burdock, Austin Rhodes   1,000 mg at 01/11/19 2138  . tamsulosin (FLOMAX) capsule 0.4 mg  0.4 mg Oral QPC supper Antonieta Pert, MD  0.4 mg at 01/08/19 1708    Lab Results: No results found for this or any previous visit (from the past 48 hour(s)).  Blood Alcohol level:  Lab Results  Component Value Date   ETH <10 12/16/2018   ETH <10 10/29/2017    Metabolic Disorder Labs: Lab Results  Component Value Date   HGBA1C 5.8 (H) 12/25/2018   MPG 119.76 12/25/2018   MPG 111 11/03/2017   No results found for: PROLACTIN Lab Results  Component Value Date   CHOL 133 11/03/2017   TRIG 148 11/03/2017   HDL 44 11/03/2017   CHOLHDL 3.0 11/03/2017   VLDL 30 11/03/2017   LDLCALC 59 11/03/2017   LDLCALC 39 05/10/2015    Physical Findings: AIMS:  , ,  ,  ,    CIWA:    COWS:     Musculoskeletal: Strength & Muscle Tone: within normal limits Gait & Station: normal Patient leans: N/A  Psychiatric Specialty Exam: Physical Exam  Nursing note and vitals reviewed. Constitutional: He is oriented to person, place, and time. He appears well-developed and well-nourished.  Cardiovascular: Normal rate.  Respiratory: Effort normal.  Musculoskeletal:        General: Normal range of motion.  Neurological: He is alert and oriented to person, place, and time.  Skin: Skin is warm.    Review of Systems  Constitutional: Negative.   HENT: Negative.   Eyes: Negative.   Respiratory: Negative.   Cardiovascular: Negative.   Gastrointestinal: Negative.   Genitourinary: Negative.   Musculoskeletal: Negative.   Skin: Negative.   Neurological: Negative.     Blood pressure (!) 135/92, pulse 96, temperature 97.7 F (36.5 C), temperature source Oral, resp. rate 17, height  (1.778 m), weight 76 kg, SpO2 97 %.Body mass  index is 24.04 kg/m.  General Appearance: Disheveled  Eye Contact:  Good  Speech:  Clear and Coherent and Normal Rate  Volume:  Normal  Mood:  Anxious and Euthymic  Affect:  Congruent  Thought Process:  Irrelevant  Orientation:  Full (Time, Place, and Person)  Thought Content:  Delusions, Paranoid Ideation, Rumination and Tangential  Suicidal Thoughts:  No  Homicidal Thoughts:  No  Memory:  Immediate;   Fair Recent;   Fair Remote;   Fair  Judgement:  Impaired  Insight:  Lacking  Psychomotor Activity:  Normal  Concentration:  Concentration: Fair  Recall:  [employer] of Knowledge:  Fair  Language:  Good  Akathisia:  No  Handed:  Right  AIMS (if indicated):     Assets:  Financial Resources/Insurance Housing Resilience Social Support  ADL's:  Intact  Cognition:  WNL  Sleep:  Number of Hours: 5.75   Assessment: Patient presents in his room and is calm, cooperative, and pleasant.  Patient's speech is much slower and he is able to articulate in discuss medications as well as his mental health disorder.  Patient is more easily redirectable today than he was yesterday.  Patient does not get irritated today and is able to state that he would like for me to discuss things with his daughter instead of becoming demanding.  After discussing it with his daughter and she does have a plan to come and visit him tonight to assess for baseline and the plan after consulting with Dr. Toni Amend is to discharge the patient home with her tomorrow.  Patient is scheduled to have a valproic acid level drawn tomorrow morning.  Treatment Plan Summary: Daily contact with patient to assess and evaluate  symptoms and progress in treatment and Medication management Continue Plavix 75 mg p.o. daily Continue Depakote DR 1000 mg p.o. every evening and 500 mg p.o. every morning for bipolar disorder Continue Ativan IM 2 mg every 6 hours as needed for anxiety Continue Lamictal 100 mg p.o. daily for bipolar  disorder Continue Vistaril 50 mg p.o. 3 times daily as needed for anxiety Continue Seroquel 1000 mg p.o. nightly for bipolar disorder Continue agitation protocol of Zyprexa Zydis and Geodon as needed for agitation Encourage group therapy participation Continue every 15 minute safety checks Plan discharge for tomorrow after daughter visits tonight  Maryfrances Bunnellravis B Masai Kidd, Austin Rhodes 01/12/2019, 3:32 PM

## 2019-01-12 NOTE — [facility] Group Notes (Signed)
LCSW Group Therapy Note  01/12/2019 11:46 AM  Type of Therapy/Topic:  Group Therapy:  Balance in Life  Participation Level:  Active  Description of Group:    This group will address the concept of balance and how it feels and looks when one is unbalanced. Patients will be encouraged to process areas in their lives that are out of balance and identify reasons for remaining unbalanced. Facilitators will guide patients in utilizing problem-solving interventions to address and correct the stressor making their life unbalanced. Understanding and applying boundaries will be explored and addressed for obtaining and maintaining a balanced life. Patients will be encouraged to explore ways to assertively make their unbalanced needs known to significant others in their lives, using other group members and facilitator for support and feedback.  Therapeutic Goals: 1. Patient will identify two or more emotions or situations they have that consume much of in their lives. 2. Patient will identify signs/triggers that life has become out of balance:  3. Patient will identify two ways to set boundaries in order to achieve balance in their lives:  4. Patient will demonstrate ability to communicate their needs through discussion and/or role plays  Summary of Patient Progress: Pt was present in group for the first half and was active, although pts speech was all delusional talk. Pt discussed how he's been to [place] and plenty of other places. 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 Pt identified COVID as something that can throw someone off balance and reported a good way to maintain balance is eat, sleep, and exercise.     Therapeutic Modalities:   Cognitive Behavioral Therapy Solution-Focused Therapy Assertiveness Training  Evalina Field, MSW, LCSW Clinical Social Work 01/12/2019 11:46 AM

## 2019-01-12 NOTE — Progress Notes (Signed)
Pt has been calm and cooperative today. Pt has attended groups and been social. Pt has still expressed grandiose delusions. He has sent less letters today beneath the door crack than yesterday. Collier Bullock RN

## 2019-01-12 NOTE — Progress Notes (Signed)
Recreation Therapy Notes   Date: 01/12/2019  Time: 9:30 am  Location: Craft room  Behavioral response: Appropriate, Redirection needed  Intervention Topic: Leisure    Discussion/Intervention:  Group content today was focused on leisure. The group defined what leisure is and some positive leisure activities they participate in. Individuals identified the difference between good and bad leisure. Participants expressed how they feel after participating in the leisure of their choice. The group discussed how they go about picking a leisure activity and if others are involved in their leisure activities. The patient stated how many leisure activities they have to choose from and reasons why it is important to have leisure time. Individuals participated in the intervention "Exploration of Leisure" where they had a chance to identify new leisure activities as well as benefits of leisure. Clinical Observations/Feedback:  Patient came to group and defined leisure as him working with his [employer]. He explained that when he is not working on his [employer] he likes to fish, read, hunt and travel. Participant expressed that it is important to plan for leisure, so you have something to look forward to. 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  Henrietta Cieslewicz LRT/CTRS          Domique Reardon 01/12/2019 11:26 AM

## 2019-01-12 NOTE — Plan of Care (Signed)
  Problem: Education: Goal: Will be free of psychotic symptoms Outcome: Not Progressing Goal: Knowledge of the prescribed therapeutic regimen will improve Outcome: Not Progressing  D: Patient continues to be delusional and perseverate about whistleblower's syndrome. Asking for sheriff to be called again to pick him up. Told patient he was on [facility] wait list and that he would not be picked up until they had an opening. Then patient asked about seeing an attorney and passed a note under the door referencing whistleblower retaliation. Denies SI. A: Continue to monitor for safety R: Safety maintained.

## 2019-01-13 LAB — VALPROIC ACID LEVEL: Valproic Acid Lvl: 86 ug/mL (ref 50.0–100.0)

## 2019-01-13 MED ORDER — HYDROXYZINE HCL 50 MG PO TABS: 50.0000 mg | ORAL_TABLET | Freq: Three times a day (TID) | ORAL | 1 refills | Status: DC | PRN

## 2019-01-13 MED ORDER — DIVALPROEX SODIUM 500 MG PO DR TAB: 500.0000 mg | DELAYED_RELEASE_TABLET | Freq: Every morning | ORAL | 1 refills | Status: DC

## 2019-01-13 MED ORDER — LAMOTRIGINE 100 MG PO TABS: 100.0000 mg | ORAL_TABLET | Freq: Every day | ORAL | 1 refills | Status: DC

## 2019-01-13 MED ORDER — DIVALPROEX SODIUM 500 MG PO DR TAB: 1000.0000 mg | DELAYED_RELEASE_TABLET | Freq: Every evening | ORAL | 1 refills | Status: DC

## 2019-01-13 MED ORDER — QUETIAPINE FUMARATE 200 MG PO TABS: 1000.0000 mg | ORAL_TABLET | Freq: Every day | ORAL | 1 refills | Status: DC

## 2019-01-13 MED ORDER — CLOPIDOGREL BISULFATE 75 MG PO TABS: 75.0000 mg | ORAL_TABLET | Freq: Every day | ORAL | 1 refills | Status: DC

## 2019-01-13 MED ORDER — TAMSULOSIN HCL 0.4 MG PO CAPS: 0.4000 mg | ORAL_CAPSULE | Freq: Every day | ORAL | 1 refills | Status: DC

## 2019-01-13 MED ORDER — METOPROLOL SUCCINATE ER 50 MG PO TB24: 50.0000 mg | ORAL_TABLET | Freq: Every day | ORAL | 1 refills | Status: DC

## 2019-01-13 NOTE — Progress Notes (Signed)
Recreation Therapy Notes  INPATIENT RECREATION TR PLAN  Patient Details Name: Austin Rhodes MRN: [id number] DOB: 1950/02/11 Today's Date: 01/13/2019  Rec Therapy Plan Is patient appropriate for Therapeutic Recreation?: Yes Treatment times per week: At least 3 Estimated Length of Stay: 5-7 days TR Treatment/Interventions: Group participation (Comment)  Discharge Criteria Pt will be discharged from therapy if:: Discharged Treatment plan/goals/alternatives discussed and agreed upon by:: Patient/family  Discharge Summary Short term goals set: Patient will focus on task/topic with 2 prompts from staff within 5 recreation therapy group sessions Short term goals met: Adequate for discharge Progress toward goals comments: Groups attended Which groups?: Coping skills, Leisure education, Other (Comment)(Relaxation) Reason goals not met: N/A Therapeutic equipment acquired: N/A Reason patient discharged from therapy: Discharge from hospital Pt/family agrees with progress & goals achieved: Yes Date patient discharged from therapy: 01/13/19   Bethany Hirt 01/13/2019, 11:30 AM

## 2019-01-13 NOTE — Progress Notes (Signed)
Recreation Therapy Notes  Date: 01/13/2019  Time: 9:30 am  Location: Craft room  Behavioral response: Appropriate  Intervention Topic: Relaxation    Discussion/Intervention:  Group content today was focused on relaxation. The group defined relaxation and identified healthy ways to relax. Individuals expressed how much time they spend relaxing. Patients expressed how much their life would be if they did not make time for themselves to relax. The group stated ways they could improve their relaxation techniques in the future.  Individuals participated in the intervention "Time to Relax" where they had a chance to experience different relaxation techniques.  Clinical Observations/Feedback:  Patient came to group and defined relaxation as focus. He explained that he enjoys riding bikes and watching sports to relax. Participant express relaxation is based on the amount of time you can afford to spare. Individual left group early stating "I'm going to have to pass on this."   [name] LRT/CTRS           Austin Rhodes 01/13/2019 11:05 AM

## 2019-01-13 NOTE — Discharge Summary (Signed)
Physician Discharge Summary Note  Patient:  Austin Rhodes is an 68 y.o., male MRN:  [id number] DOB:  1950/08/17 Patient phone:  [phone] (home)  Patient address:   [address] [place] [address],  Total Time spent with patient: 30 minutes  Date of Admission:  12/25/2018 Date of Discharge: 01/13/19  Reason for Admission:  Severe delusional thinking and psychotic behavior after driving his [name] into the police department  Principal Problem: Bipolar I disorder, current or most recent episode manic, with psychotic features [facility]) Discharge Diagnoses: Principal Problem:   Bipolar I disorder, current or most recent episode manic, with psychotic features (Austin Rhodes) Active Problems:   Affective psychosis, bipolar ([name])   Bipolar disorder ([name])   Past Psychiatric History: Patient has a history of bipolar disorder and has been seen both at our hospital and other hospitals in the past with similar symptoms no known history of suicide attempts  Past Medical History:  Past Medical History:  Diagnosis Date  . Bipolar disorder ([place])   . Coronary artery disease   . GERD (gastroesophageal reflux disease)   . History of multiple strokes   . PTSD (post-traumatic stress disorder)     Past Surgical History:  Procedure Laterality Date  . CARDIAC CATHETERIZATION     Family History: History reviewed. No pertinent family history. Family Psychiatric  History: None known Social History:  Social History   Substance and Sexual Activity  Alcohol Use No     Social History   Substance and Sexual Activity  Drug Use Never    Social History   Socioeconomic History  . Marital status: Legally Separated    Spouse name: Not on file  . Number of children: Not on file  . Years of education: Not on file  . Highest education level: Not on file  Occupational History  . Not on file  Tobacco Use  . Smoking status: Never Smoker  . Smokeless tobacco: Never Used  Substance and Sexual  Activity  . Alcohol use: No  . Drug use: Never  . Sexual activity: Not on file  Other Topics Concern  . Not on file  Social History Narrative  . Not on file   Social Determinants of Health   Financial Resource Strain:   . Difficulty of Paying Living Expenses: Not on file  Food Insecurity:   . Worried About Charity fundraiser in the Last Year: Not on file  . Ran Out of Food in the Last Year: Not on file  Transportation Needs:   . Lack of Transportation (Medical): Not on file  . Lack of Transportation (Non-Medical): Not on file  Physical Activity:   . Days of Exercise per Week: Not on file  . Minutes of Exercise per Session: Not on file  Stress:   . Feeling of Stress : Not on file  Social Connections:   . Frequency of Communication with Friends and Family: Not on file  . Frequency of Social Gatherings with Friends and Family: Not on file  . Attends Religious Services: Not on file  . Active Member of Clubs or Organizations: Not on file  . Attends Archivist Meetings: Not on file  . Marital Status: Not on file    Hospital Course:  Patient remained on the [facility] unit for 19 days. The patient stabilized on medication and therapy. Patient was discharged on Seroquel 1000 mg nightly, Flomax 0.4 mg daily, metoprolol succinate 50 mg daily, Lamictal 100 mg daily, Vistaril 50  mg 3 times daily as needed, Depakote 1000 mg q evening and 500 mg every morning, and Plavix 75 mg daily. Patient has shown improvement with improved mood, affect, sleep, appetite, and interaction. Patient has attended group and participated. Patient has been seen in the day room interacting with peers and staff appropriately. Patient denies any SI/HI/AVH and contracts for safety. Patient agrees to follow up at [facility]. Patient is provided with prescriptions for their medications upon discharge.  Patient's daughter has assessed the patient and feels that he is at his baseline and was ready for him to  be discharged home.  Patient's medications were E prescribed to [facility].  Patient's last Depakote level on day of discharge was 86.  Physical Findings: AIMS:  , ,  ,  ,    CIWA:    COWS:     Musculoskeletal: Strength & Muscle Tone: within normal limits Gait & Station: normal Patient leans: N/A  Psychiatric Specialty Exam: Physical Exam  Nursing note and vitals reviewed. Constitutional: He is oriented to person, place, and time. He appears well-developed and well-nourished.  Cardiovascular: Normal rate.  Respiratory: Effort normal.  Musculoskeletal:        General: Normal range of motion.  Neurological: He is alert and oriented to person, place, and time.  Skin: Skin is warm.    Review of Systems  Constitutional: Negative.   HENT: Negative.   Eyes: Negative.   Respiratory: Negative.   Cardiovascular: Negative.   Gastrointestinal: Negative.   Genitourinary: Negative.   Musculoskeletal: Negative.   Skin: Negative.   Neurological: Negative.   Psychiatric/Behavioral: The patient is nervous/anxious.     Blood pressure 140/85, pulse 87, temperature 97.8 F (36.6 C), temperature source Oral, resp. rate 18, height  (1.778 m), weight 76 kg, SpO2 97 %.Body mass index is 24.04 kg/m.   General Appearance: Casual  Eye Contact::  Fair  Speech:  Normal Rate409  Volume:  Normal  Mood:  Euthymic  Affect:  Congruent  Thought Process:  Disorganized and Irrelevant  Orientation:  Full (Time, Place, and Person)  Thought Content:  Illogical and Delusions  Suicidal Thoughts:  No  Homicidal Thoughts:  No  Memory:  Immediate;   Fair Recent;   Fair Remote;   Fair  Judgement:  Impaired  Insight:  Shallow  Psychomotor Activity:  Normal  Concentration:  Fair  Recall:  Fair  Fund of Knowledge:Fair  Language: Fair  Akathisia:  No  Handed:  Right  AIMS (if indicated):     Assets:  Desire for Improvement Housing Physical Health Resilience Social Support  Sleep:  Number  of Hours: 6  Cognition: Impaired,  Mild  ADL's:  Intact   Have you used any form of tobacco in the last 30 days? (Cigarettes, Smokeless Tobacco, Cigars, and/or Pipes): No  Has this patient used any form of tobacco in the last 30 days? (Cigarettes, Smokeless Tobacco, Cigars, and/or Pipes) Yes, No  Blood Alcohol level:  Lab Results  Component Value Date   ETH <10 12/16/2018   ETH <10 10/29/2017    Metabolic Disorder Labs:  Lab Results  Component Value Date   HGBA1C 5.8 (H) 12/25/2018   MPG 119.76 12/25/2018   MPG 111 11/03/2017   No results found for: PROLACTIN Lab Results  Component Value Date   CHOL 133 11/03/2017   TRIG 148 11/03/2017   HDL 44 11/03/2017   CHOLHDL 3.0 11/03/2017   VLDL 30 11/03/2017   LDLCALC 59 11/03/2017  LDLCALC 39 05/10/2015    See Psychiatric Specialty Exam and Suicide Risk Assessment completed by Attending Physician prior to discharge.  Discharge destination:  Home  Is patient on multiple antipsychotic therapies at discharge:  No   Has Patient had three or more failed trials of antipsychotic monotherapy by history:  No  Recommended Plan for Multiple Antipsychotic Therapies: NA  Discharge Instructions    Diet - low sodium heart healthy   Complete by: As directed    Increase activity slowly   Complete by: As directed      Allergies as of 01/13/2019      Reactions   Procaine Other (See Comments)      Medication List    STOP taking these medications   alum & mag hydroxide-simeth 200-200-20 MG/5ML suspension Commonly known as: MAALOX/MYLANTA   B COMPLETE PO   benztropine 1 MG tablet Commonly known as: COGENTIN   magnesium hydroxide 400 MG/5ML suspension Commonly known as: MILK OF MAGNESIA   multivitamin with minerals Tabs tablet   perphenazine 4 MG tablet Commonly known as: TRILAFON   QUEtiapine 200 MG 24 hr tablet Commonly known as: SEROQUEL XR Replaced by: QUEtiapine 200 MG tablet   thiamine 100 MG/ML  injection Commonly known as: B-1     TAKE these medications     Indication  acetaminophen 325 MG tablet Commonly known as: TYLENOL Take 2 tablets (650 mg total) by mouth every 6 (six) hours as needed for mild pain or moderate pain.  Indication: Pain   clopidogrel 75 MG tablet Commonly known as: PLAVIX Take 1 tablet (75 mg total) by mouth daily. Start taking on: January 14, 2019  Indication: Per PCP   divalproex 500 MG DR tablet Commonly known as: DEPAKOTE Take 1 tablet (500 mg total) by mouth every morning. What changed:   medication strength  how much to take  when to take this  Indication: Manic Phase of Manic-Depression   divalproex 500 MG DR tablet Commonly known as: DEPAKOTE Take 2 tablets (1,000 mg total) by mouth every evening. What changed: You were already taking a medication with the same name, and this prescription was added. Make sure you understand how and when to take each.  Indication: Manic Phase of Manic-Depression   hydrOXYzine 50 MG tablet Commonly known as: ATARAX/VISTARIL Take 1 tablet (50 mg total) by mouth 3 (three) times daily as needed for anxiety.  Indication: Feeling Anxious   lamoTRIgine 100 MG tablet Commonly known as: LAMICTAL Take 1 tablet (100 mg total) by mouth daily. Start taking on: January 14, 2019 What changed:   medication strength  how much to take  Indication: Manic-Depression   metoprolol succinate 50 MG 24 hr tablet Commonly known as: TOPROL-XL Take 1 tablet (50 mg total) by mouth daily. Start taking on: January 14, 2019 What changed:   medication strength  how much to take  Indication: High Blood Pressure Disorder   QUEtiapine 200 MG tablet Commonly known as: SEROQUEL Take 5 tablets (1,000 mg total) by mouth at bedtime. Replaces: QUEtiapine 200 MG 24 hr tablet  Indication: Manic Phase of Manic-Depression   rosuvastatin 10 MG tablet Commonly known as: CRESTOR Take 1 tablet (10 mg total) by mouth  daily.  Indication: High Amount of Fats in the Blood   tamsulosin 0.4 MG Caps capsule Commonly known as: FLOMAX Take 1 capsule (0.4 mg total) by mouth daily after supper.  Indication: Per PCP      Follow-up Information    Pc, [facility] Follow up on 01/24/2019.   Why: You have a hospital discharge appointment scheduled on Tuesday 01/24/2019 at 9am. Please bring your hospital discharge paperwork with you to your appointment. Thank You! Contact information: [address] [place] [address] [phone]           Follow-up recommendations:  Continue activity as tolerated. Continue diet as recommended by your PCP. Ensure to keep all appointments with outpatient providers.  Comments:  Patient is instructed prior to discharge to: Take all medications as prescribed by his/her mental healthcare provider. Report any adverse effects and or reactions from the medicines to his/her outpatient provider promptly. Patient has been instructed & cautioned: To not engage in alcohol and or illegal drug use while on prescription medicines. In the event of worsening symptoms, patient is instructed to call the crisis hotline, 911 and or go to the nearest ED for appropriate evaluation and treatment of symptoms. To follow-up with his/her primary care provider for your other medical issues, concerns and or health care needs.    Signed: Gerlene Burdock Tamara Monteith, FNP 01/13/2019, 9:52 AM

## 2019-01-13 NOTE — Progress Notes (Signed)
Patient discharged per MD order. Denies SI/HI upon discharge. Discharge instructions given to both patient and daughter. No sign of distress upon discharge.

## 2019-01-13 NOTE — BHH Group Notes (Signed)
LCSW Group Therapy Note  01/13/2019 12:50 PM  Type of Therapy and Topic:  Group Therapy:  Feelings around Relapse and Recovery  Participation Level:  None   Description of Group:    Patients in this group will discuss emotions they experience before and after a relapse. They will process how experiencing these feelings, or avoidance of experiencing them, relates to having a relapse. Facilitator will guide patients to explore emotions they have related to recovery. Patients will be encouraged to process which emotions are more powerful. They will be guided to discuss the emotional reaction significant others in their lives may have to their relapse or recovery. Patients will be assisted in exploring ways to respond to the emotions of others without this contributing to a relapse.  Therapeutic Goals: 1. Patient will identify two or more emotions that lead to a relapse for them 2. Patient will identify two emotions that result when they relapse 3. Patient will identify two emotions related to recovery 4. Patient will demonstrate ability to communicate their needs through discussion and/or role plays   Summary of Patient Progress: Patient arrived at group as group was coming to an end.   Therapeutic Modalities:   Cognitive Behavioral Therapy Solution-Focused Therapy Assertiveness Training Relapse Prevention Therapy   [name], MSW, LCSW 01/13/2019 12:50 PM

## 2019-01-13 NOTE — BHH Suicide Risk Assessment (Signed)
[facility] Discharge Suicide Risk Assessment   Principal Problem: Bipolar I disorder, current or most recent episode manic, with psychotic features [facility]) Discharge Diagnoses: Principal Problem:   Bipolar I disorder, current or most recent episode manic, with psychotic features ([place]) Active Problems:   Affective psychosis, bipolar ([place])   Bipolar disorder ([place])   Total Time spent with patient: 30 minutes  Musculoskeletal: Strength & Muscle Tone: within normal limits Gait & Station: normal Patient leans: N/A  Psychiatric Specialty Exam: Review of Systems  Constitutional: Negative.   HENT: Negative.   Eyes: Negative.   Respiratory: Negative.   Cardiovascular: Negative.   Gastrointestinal: Negative.   Musculoskeletal: Negative.   Skin: Negative.   Neurological: Negative.   Psychiatric/Behavioral: Negative for agitation, behavioral problems, confusion, decreased concentration, dysphoric mood, hallucinations, self-injury, sleep disturbance and suicidal ideas. The patient is nervous/anxious. The patient is not hyperactive.     Blood pressure 140/85, pulse 87, temperature 97.8 F (36.6 C), temperature source Oral, resp. rate 18, height 5\' 10"  (1.778 m), weight 76 kg, SpO2 97 %.Body mass index is 24.04 kg/m.  General Appearance: Casual  Eye Contact::  Fair  Speech:  Normal Rate409  Volume:  Normal  Mood:  Euthymic  Affect:  Congruent  Thought Process:  Disorganized and Irrelevant  Orientation:  Full (Time, Place, and Person)  Thought Content:  Illogical and Delusions  Suicidal Thoughts:  No  Homicidal Thoughts:  No  Memory:  Immediate;   Fair Recent;   Fair Remote;   Fair  Judgement:  Impaired  Insight:  Shallow  Psychomotor Activity:  Normal  Concentration:  Fair  Recall:  [employer] of Knowledge:Fair  Language: Fair  Akathisia:  No  Handed:  Right  AIMS (if indicated):     Assets:  Desire for Improvement Housing Physical Health Resilience Social Support  Sleep:   Number of Hours: 6  Cognition: Impaired,  Mild  ADL's:  Intact   Mental Status Per Nursing Assessment::   On Admission:  NA  Demographic Factors:  Male, Age 66 or older, Caucasian and Unemployed  Loss Factors: NA  Historical Factors: Impulsivity  Risk Reduction Factors:   Sense of responsibility to family, Religious beliefs about death, Positive social support, Positive therapeutic relationship and Positive coping skills or problem solving skills  Continued Clinical Symptoms:  Bipolar Disorder:   Mixed State  Cognitive Features That Contribute To Risk:  None    Suicide Risk:  Minimal: No identifiable suicidal ideation.  Patients presenting with no risk factors but with morbid ruminations; may be classified as minimal risk based on the severity of the depressive symptoms  Follow-up Information    Pc, [facility] Follow up on 01/24/2019.   Why: You have a hospital discharge appointment scheduled on Tuesday 01/24/2019 at [facility]. Please bring your hospital discharge paperwork with you to your appointment. Thank You! Contact information: [place] [address] [phone]           Plan Of Care/Follow-up recommendations:  Activity:  as tolerated Diet:  regular Other:  follow up with outpatient provider  Alethia Berthold, MD 01/13/2019, 9:22 AM

## 2019-01-13 NOTE — Progress Notes (Signed)
  [facility] Adult Case Management Discharge Plan :  Will you be returning to the same living situation after discharge:  Yes,  home At discharge, do you have transportation home?: Yes,  pts daughter will pick him up around 5pm Do you have the ability to pay for your medications: Yes,  medicaid  Release of information consent forms completed and in the chart;  Patient to Follow up at: Follow-up Information    Pc, [facility] Follow up on 01/24/2019.   Why: You have a hospital discharge appointment scheduled on Tuesday 01/24/2019 at [facility]. Please bring your hospital discharge paperwork with you to your appointment. Thank You! Contact information: [place] [address] [phone]           Next level of care provider has access to [place] and Suicide Prevention discussed: Yes,  SPE completed with pts daughter  Have you used any form of tobacco in the last 30 days? (Cigarettes, Smokeless Tobacco, Cigars, and/or Pipes): No  Has patient been referred to the Quitline?: N/A patient is not a smoker  Patient has been referred for addiction treatment: N/A  Delfin Edis, LCSW 01/13/2019, 9:22 AM

## 2019-03-28 DIAGNOSIS — E785 Hyperlipidemia, unspecified: Secondary | ICD-10-CM | POA: Insufficient documentation

## 2019-03-28 DIAGNOSIS — I471 Supraventricular tachycardia: Secondary | ICD-10-CM | POA: Diagnosis present

## 2019-04-29 ENCOUNTER — Ambulatory Visit: Payer: Self-pay

## 2020-03-10 IMAGING — CT CT HEAD W/O CM
3 of 4 series · 16 of 47 positions shown, 19 images · non-contrast
Comparison: 03/31/2017

CLINICAL DATA: Minor head trauma.  Fall this morning.  Unresponsive

EXAM:
CT HEAD WITHOUT CONTRAST
TECHNIQUE: Contiguous axial images were obtained from the base of the skull
through the vertex without intravenous contrast.

[Series 2: head wo · axial · 0.42mm/px · z∈[-86,+44]mm · 10 of 32 slices shown, 13 images]
[im 3/32  brain]
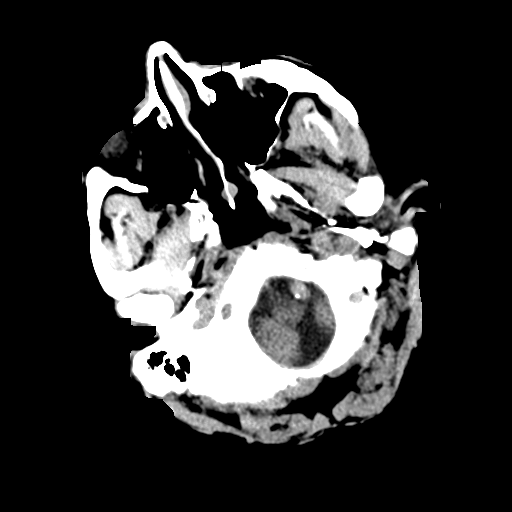
[im 3/32  bone]
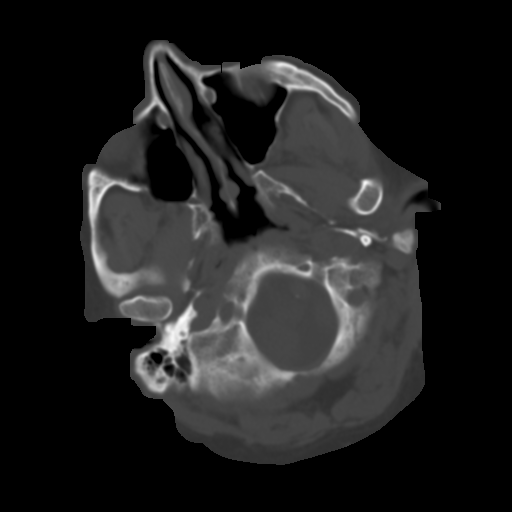
[im 5/32  brain]
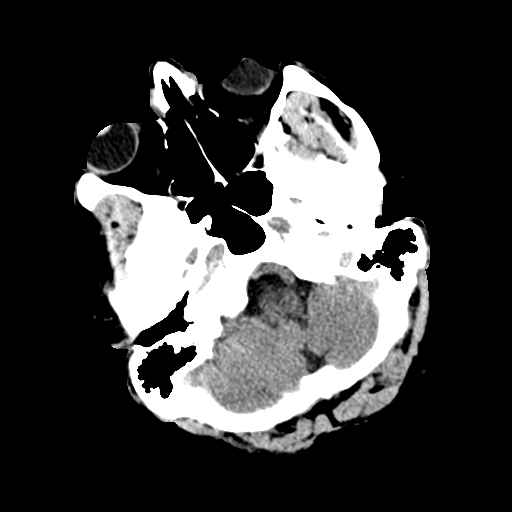
[im 9/32  brain]
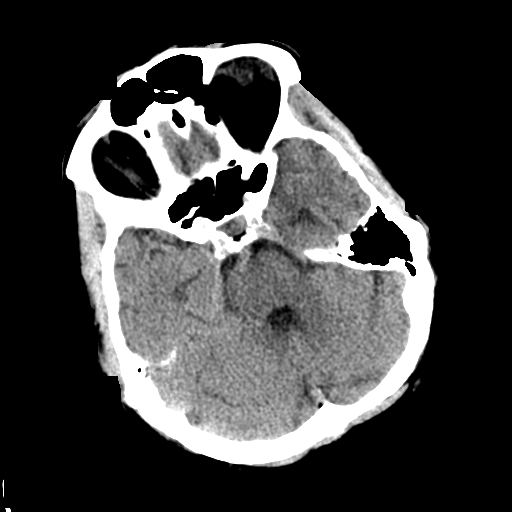
[im 12/32  brain]
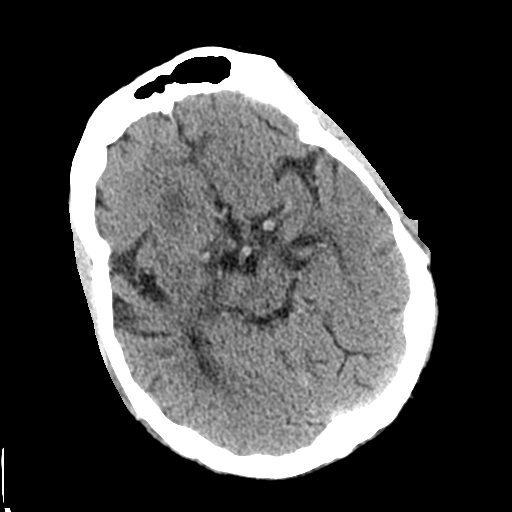
[im 14/32  brain]
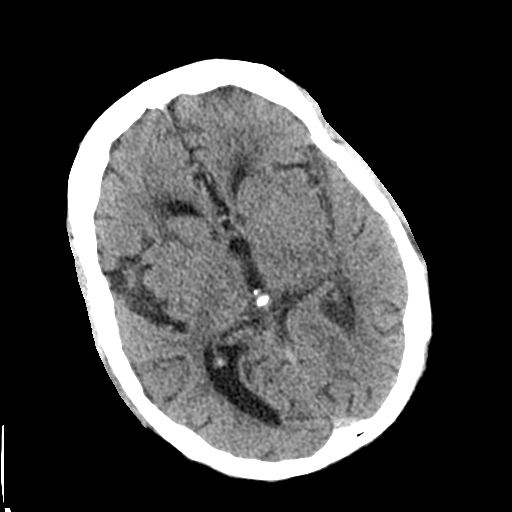
[im 14/32  bone]
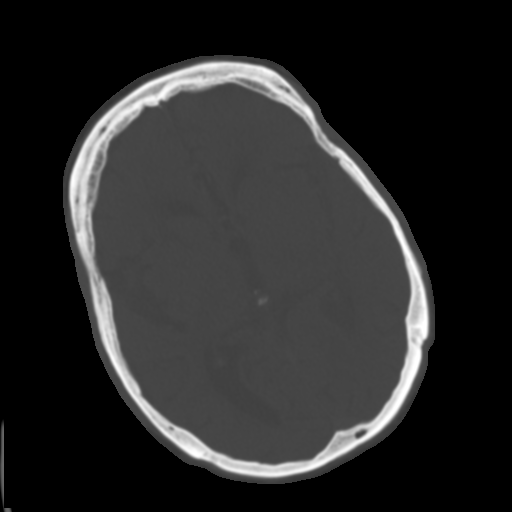
[im 18/32  brain]
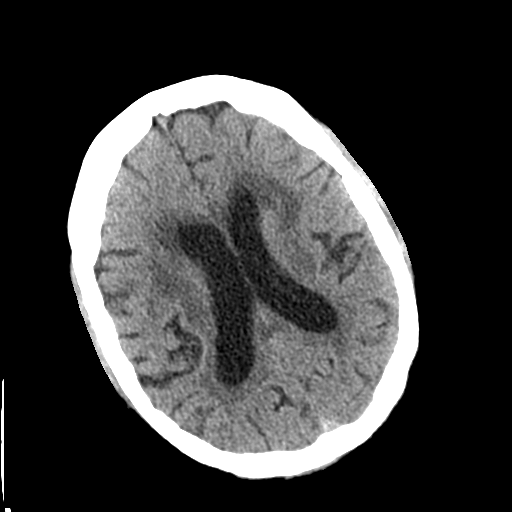
[im 20/32  brain]
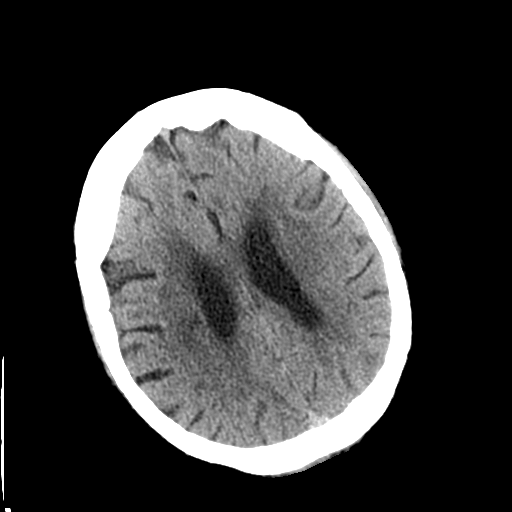
[im 23/32  brain]
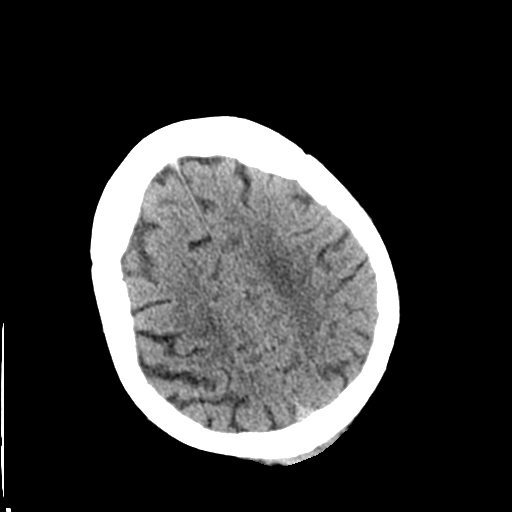
[im 27/32  brain]
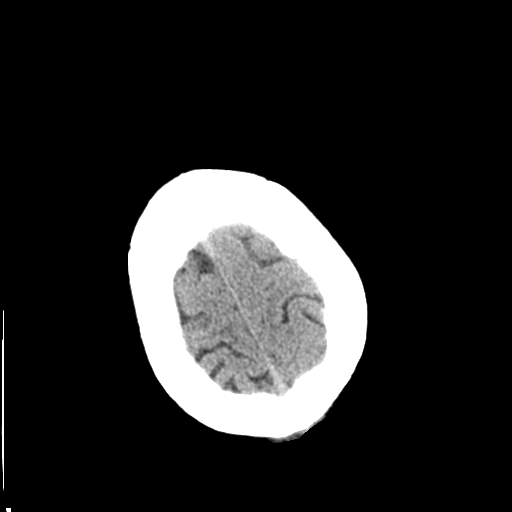
[im 27/32  bone]
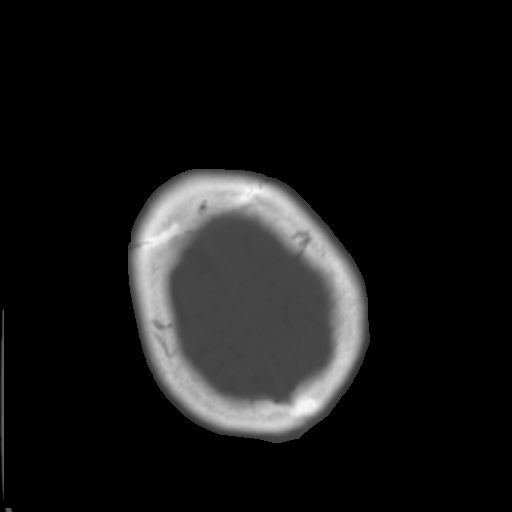
[im 29/32  brain]
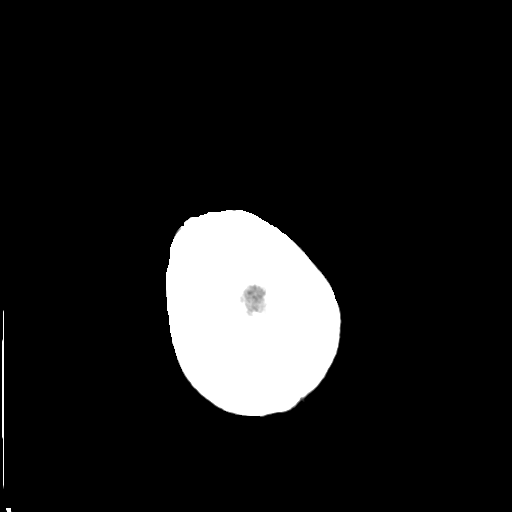

[Series 5: coronal soft tissue · coronal · 0.32mm/px · 3 of 62 slices shown]
[im 21/62  brain]
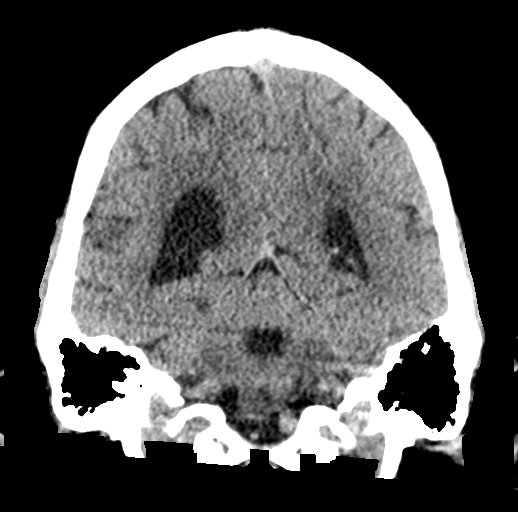
[im 28/62  brain]
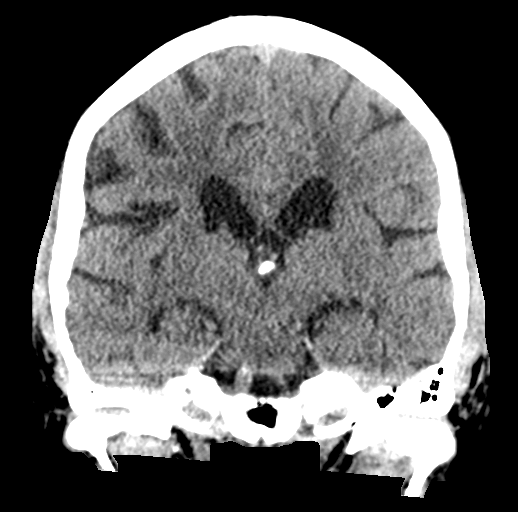
[im 34/62  brain]
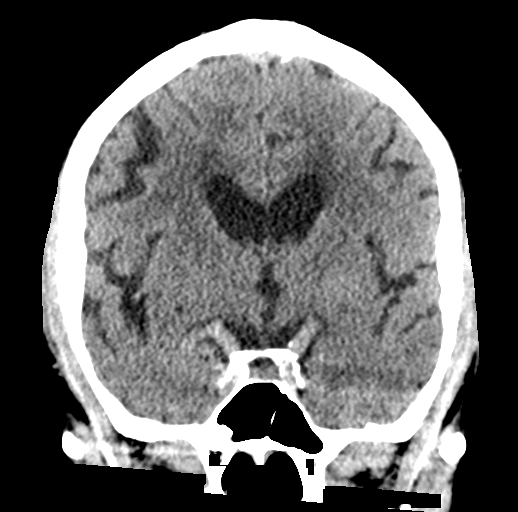

[Series 6: sagittal soft tissue · sagittal · 0.32mm/px · 3 of 52 slices shown]
[im 19/52  brain]
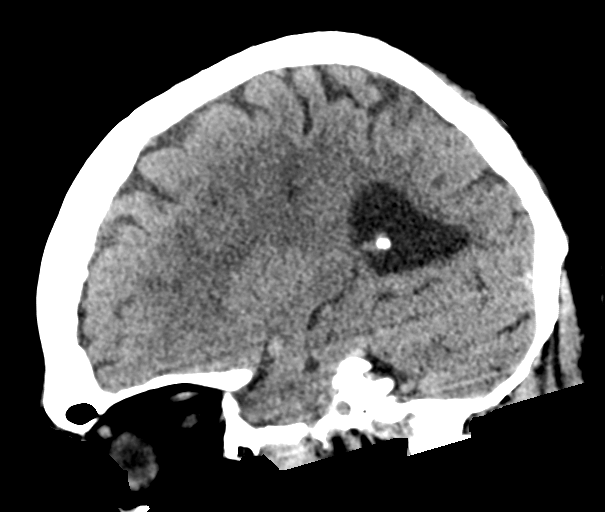
[im 26/52  brain]
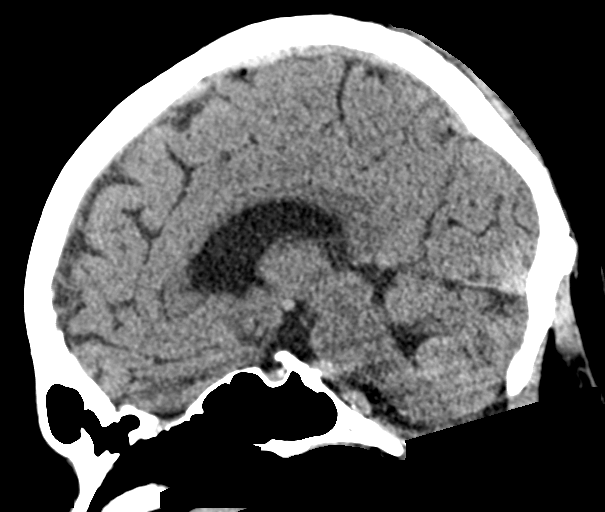
[im 34/52  brain]
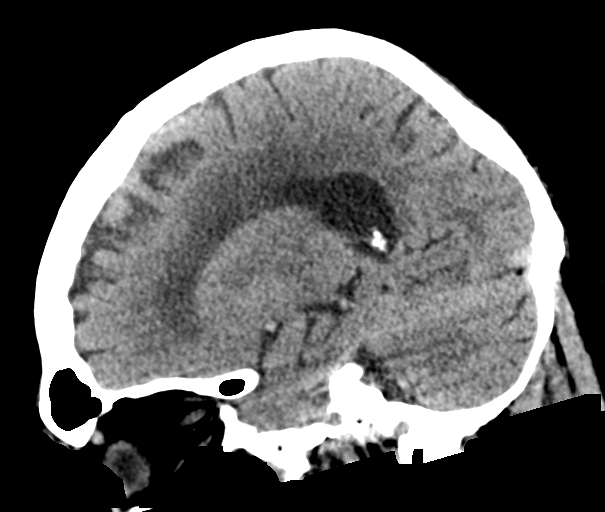

[16 of 47 positions shown; findings below may reference images not displayed]

FINDINGS: Brain: No evidence of acute infarction, hemorrhage, hydrocephalus,
extra-axial collection or mass lesion/mass effect. Chronic small
vessel ischemia with low-density throughout much of the cerebral
white matter. Mild cerebral volume loss.

Vascular: Atherosclerotic calcification with vertebrobasilar
tortuosity.

Skull: Negative for fracture or bone lesion.

Sinuses/Orbits: Negative
IMPRESSION: 1. No acute finding.
2. Chronic small vessel ischemia.

## 2020-03-13 ENCOUNTER — Emergency Department (EMERGENCY_DEPARTMENT_HOSPITAL)
Admission: EM | Admit: 2020-03-13 | Discharge: 2020-03-15 | Disposition: A | Discharge status: Home / Self Care | Payer: Medicare Other | Source: Home / Self Care | Attending: Emergency Medicine | Admitting: Emergency Medicine

## 2020-03-13 ENCOUNTER — Encounter: Payer: Self-pay | Admitting: *Deleted

## 2020-03-13 ENCOUNTER — Other Ambulatory Visit: Payer: Self-pay

## 2020-03-13 DIAGNOSIS — Z20822 Contact with and (suspected) exposure to covid-19: Secondary | ICD-10-CM | POA: Insufficient documentation

## 2020-03-13 DIAGNOSIS — F23 Brief psychotic disorder: Secondary | ICD-10-CM

## 2020-03-13 DIAGNOSIS — I2510 Atherosclerotic heart disease of native coronary artery without angina pectoris: Secondary | ICD-10-CM | POA: Insufficient documentation

## 2020-03-13 DIAGNOSIS — Z79899 Other long term (current) drug therapy: Secondary | ICD-10-CM | POA: Insufficient documentation

## 2020-03-13 DIAGNOSIS — I10 Essential (primary) hypertension: Secondary | ICD-10-CM | POA: Insufficient documentation

## 2020-03-13 DIAGNOSIS — Z7902 Long term (current) use of antithrombotics/antiplatelets: Secondary | ICD-10-CM | POA: Insufficient documentation

## 2020-03-13 DIAGNOSIS — F29 Unspecified psychosis not due to a substance or known physiological condition: Secondary | ICD-10-CM | POA: Insufficient documentation

## 2020-03-13 DIAGNOSIS — R7303 Prediabetes: Secondary | ICD-10-CM | POA: Diagnosis present

## 2020-03-13 DIAGNOSIS — F312 Bipolar disorder, current episode manic severe with psychotic features: Secondary | ICD-10-CM | POA: Diagnosis present

## 2020-03-13 DIAGNOSIS — F4310 Post-traumatic stress disorder, unspecified: Secondary | ICD-10-CM | POA: Insufficient documentation

## 2020-03-13 LAB — CBC
HCT: 50.4 % (ref 39.0–52.0)
Hemoglobin: 17.3 g/dL — ABNORMAL HIGH (ref 13.0–17.0)
MCH: 29.9 pg (ref 26.0–34.0)
MCHC: 34.3 g/dL (ref 30.0–36.0)
MCV: 87.2 fL (ref 80.0–100.0)
Platelets: 205 10*3/uL (ref 150–400)
RBC: 5.78 MIL/uL (ref 4.22–5.81)
RDW: 13.4 % (ref 11.5–15.5)
WBC: 12.6 10*3/uL — ABNORMAL HIGH (ref 4.0–10.5)
nRBC: 0 % (ref 0.0–0.2)

## 2020-03-13 LAB — COMPREHENSIVE METABOLIC PANEL
ALT: 18 U/L (ref 0–44)
AST: 32 U/L (ref 15–41)
Albumin: 4.5 g/dL (ref 3.5–5.0)
Alkaline Phosphatase: 68 U/L (ref 38–126)
Anion gap: 18 — ABNORMAL HIGH (ref 5–15)
BUN: 15 mg/dL (ref 8–23)
CO2: 17 mmol/L — ABNORMAL LOW (ref 22–32)
Calcium: 9.8 mg/dL (ref 8.9–10.3)
Chloride: 106 mmol/L (ref 98–111)
Creatinine, Ser: 1.57 mg/dL — ABNORMAL HIGH (ref 0.61–1.24)
GFR, Estimated: 48 mL/min — ABNORMAL LOW (ref >60)
Glucose, Bld: 199 mg/dL — ABNORMAL HIGH (ref 70–99)
Potassium: 3.1 mmol/L — ABNORMAL LOW (ref 3.5–5.1)
Sodium: 141 mmol/L (ref 135–145)
Total Bilirubin: 1 mg/dL (ref 0.3–1.2)
Total Protein: 7.4 g/dL (ref 6.5–8.1)

## 2020-03-13 LAB — ETHANOL: Alcohol, Ethyl (B): <10 mg/dL (ref <10)

## 2020-03-13 LAB — VALPROIC ACID LEVEL: Valproic Acid Lvl: 18 ug/mL — ABNORMAL LOW (ref 50.0–100.0)

## 2020-03-13 MED ORDER — LORAZEPAM 2 MG PO TABS
2.0000 mg | ORAL_TABLET | Freq: Once | ORAL | Status: AC
  Administered 2020-03-13: 2 mg via ORAL
  Filled 2020-03-13: qty 1

## 2020-03-13 MED ORDER — DIVALPROEX SODIUM 500 MG PO DR TAB
1000.0000 mg | DELAYED_RELEASE_TABLET | Freq: Once | ORAL | Status: AC
  Administered 2020-03-13: 1000 mg via ORAL
  Filled 2020-03-13: qty 2

## 2020-03-13 NOTE — Consult Note (Signed)
[facility] Face-to-Face Psychiatry Consult   Reason for Consult:  Psych Evaluation Referring Physician: Dr. Scotty Court   Patient Identification: Austin Rhodes MRN:  [id number] Principal Diagnosis: Bipolar I disorder, current or most recent episode manic, with psychotic features (HCC) Diagnosis:  Principal Problem:   Bipolar I disorder, current or most recent episode manic, with psychotic features (HCC)   Total Time spent with patient: 1 hour  Subjective:   Austin Rhodes is a 70 y.o. male patient admitted with " Hey come here, Im currently working undercover."  Per triage nurse, pt talking rapidly about multiple different subjects while in triage. 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 When asked if he wanted the hear what was stated on the IVC papers pt reported he did not need to know because he was an attorney as well.   Per IVC papers pt is Bipolar and acting manic. Pt has been refusing to go to the doctors and thinks he is the president of the [place]. Pt also threatened officer that he was going to cut the tips of his fingers off.   HPI:  Tele Assessment  Austin Rhodes, 70 y.o., male patient presented to [facility].  Patient seen face to face by TTS and this provider; chart reviewed and consulted with Dr. Lucianne Muss on 03/14/20.  On evaluation Austin Rhodes reports per TTS, patient is a 70 year old male presenting to [facility] ED under IVC. During assessment patient appears alert and oriented x1, patient is unaware of who he is, the time, or the situation but is aware that he is in the ED. Patient is currently having delusions of grandeur, patient is euphoric and manic. 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 has already recorded this." Unknown if patient is experiencing SI/HI/AH/VH as patient's thoughts are disorganized and patient is delusional. Patient does have a history of hospitalizations with [facility] in 2020.  During evaluation Austin Rhodes is sitting up in the bed; he is alert/oriented x 2; hyper/delusional; and mood congruent with affect.  Patient is speaking in a clear tone at moderate volume, and fast pace; with fair eye contact.  His thought process is incoherent and irrelevant; Patient is exhibiting manic characteristics as evidenced by rapid speech, delusion of grandeur and tangential thought process. Patient denies suicidal/self-harm/homicidal ideation, psychosis, and paranoia.  Patient has remained manic throughout assessment and has not answered questions appropriately.  Past Psychiatric History: Bipolar disorder  Risk to Self:   Risk to Others:   Prior Inpatient Therapy:   Prior Outpatient Therapy:    Past Medical History:  Past Medical History:  Diagnosis Date   Bipolar disorder (HCC)    Coronary artery disease    GERD (gastroesophageal reflux disease)    History of multiple strokes    PTSD (post-traumatic stress disorder)     Past Surgical History:  Procedure Laterality Date   CARDIAC CATHETERIZATION     Family History: History reviewed. No pertinent family history. Family Psychiatric  History: unknown Social History:  Social History  Substance and Sexual Activity  Alcohol Use No     Social History   Substance and Sexual Activity  Drug Use Never    Social History   Socioeconomic History   Marital status: Legally Separated    Spouse name: Not on file   Number of children: Not on file   Years of education: Not on file   Highest education level: Not on file  Occupational History   Not on file  Tobacco Use   Smoking status:  Never Smoker   Smokeless tobacco: Never Used  Vaping Use   Vaping Use: Never used  Substance and Sexual Activity   Alcohol use: No   Drug use: Never   Sexual activity: Not on file  Other Topics Concern   Not on file  Social History Narrative   Not on file   Social Determinants of Health   Financial Resource Strain: Not on file  Food Insecurity: Not on file  Transportation Needs: Not on file  Physical Activity: Not on file  Stress: Not on file  Social Connections: Not on file   Additional Social History:    Allergies:   Allergies  Allergen Reactions   Procaine Other (See Comments)    Labs:  Results for orders placed or performed during the hospital encounter of 03/13/20 (from the past 48 hour(s))  Comprehensive metabolic panel     Status: Abnormal   Collection Time: 03/13/20  7:44 PM  Result Value Ref Range   Sodium 141 135 - 145 mmol/L   Potassium 3.1 (L) 3.5 - 5.1 mmol/L   Chloride 106 98 - 111 mmol/L   CO2 17 (L) 22 - 32 mmol/L   Glucose, Bld 199 (H) 70 - 99 mg/dL    Comment: Glucose reference range applies only to samples taken after fasting for at least 8 hours.   BUN 15 8 - 23 mg/dL   Creatinine, Ser 0.17 (H) 0.61 - 1.24 mg/dL   Calcium 9.8 8.9 - 51.0 mg/dL   Total Protein 7.4 6.5 - 8.1 g/dL   Albumin 4.5 3.5 - 5.0 g/dL   AST 32 15 - 41 U/L   ALT 18 0 - 44 U/L   Alkaline Phosphatase 68 38 - 126 U/L   Total Bilirubin 1.0 0.3 - 1.2 mg/dL   GFR, Estimated 48 (L) >60 mL/min    Comment: (NOTE) Calculated using the CKD-EPI Creatinine Equation (2021)    Anion gap 18 (H) 5 - 15    Comment: Performed at [facility], [address] [place], [place] [address]  Ethanol     Status: None   Collection Time: 03/13/20  7:44 PM  Result Value Ref Range   Alcohol, Ethyl (B) <10 <10 mg/dL    Comment: (NOTE) Lowest detectable limit for serum alcohol is 10 mg/dL.  For medical purposes only. Performed at [facility], [address]  [place], [place] [address]   cbc     Status: Abnormal   Collection Time: 03/13/20  7:44 PM  Result Value Ref Range   WBC 12.6 (H) 4.0 - 10.5 K/uL   RBC 5.78 4.22 - 5.81 MIL/uL   Hemoglobin 17.3 (H) 13.0 - 17.0 g/dL   HCT 23.5 36.1 - 44.3 %   MCV 87.2 80.0 - 100.0 fL   MCH 29.9 26.0 - 34.0 pg   MCHC 34.3 30.0 - 36.0 g/dL   RDW 15.4 00.8 - 67.6 %   Platelets 205 150 - 400 K/uL   nRBC 0.0  0.0 - 0.2 %    Comment: Performed at [facility], [address] [place], [place] [address]  Valproic acid level     Status: Abnormal   Collection Time: 03/13/20  8:58 PM  Result Value Ref Range   Valproic Acid Lvl 18 (L) 50.0 - 100.0 ug/mL    Comment: Performed at [facility], [address] [place], [place] [address]    No current facility-administered medications for this encounter.   Current Outpatient Medications  Medication Sig Dispense Refill   acetaminophen (TYLENOL) 325 MG tablet Take 2 tablets (650 mg total) by mouth every 6 (six) hours as needed for mild pain or moderate pain. 30 tablet 0   clopidogrel (PLAVIX) 75 MG tablet Take 1 tablet (75 mg total) by mouth daily. 30 tablet 1   divalproex (DEPAKOTE) 500 MG DR tablet Take 1 tablet (500 mg total) by mouth every morning. 30 tablet 1   divalproex (DEPAKOTE) 500 MG DR tablet Take 2 tablets (1,000 mg total) by mouth every evening. 60 tablet 1   hydrOXYzine (ATARAX/VISTARIL) 50 MG tablet Take 1 tablet (50 mg total) by mouth 3 (three) times daily as needed for anxiety. 30 tablet 1   lamoTRIgine (LAMICTAL) 100 MG tablet Take 1 tablet (100 mg total) by mouth daily. 30 tablet 1   metoprolol succinate (TOPROL-XL) 50 MG 24 hr tablet Take 1 tablet (50 mg total) by mouth daily. 30 tablet 1   QUEtiapine (SEROQUEL) 200 MG tablet Take 5 tablets (1,000 mg total) by mouth at bedtime. 150 tablet 1   rosuvastatin (CRESTOR) 10 MG tablet Take 1 tablet (10 mg total) by mouth daily. 30 tablet 1   tamsulosin (FLOMAX) 0.4 MG CAPS  capsule Take 1 capsule (0.4 mg total) by mouth daily after supper. 30 capsule 1    Musculoskeletal: Strength & Muscle Tone: within normal limits Gait & Station: normal Patient leans: N/A  Psychiatric Specialty Exam: Physical Exam Vitals and nursing note reviewed.  HENT:     Head: Normocephalic and atraumatic.     Nose: Nose normal.  Eyes:     Pupils: Pupils are equal, round, and reactive to light.  Pulmonary:     Effort: Pulmonary effort is normal.  Musculoskeletal:        General: Normal range of motion.     Cervical back: Normal range of motion.  Skin:    General: Skin is dry.  Neurological:     Mental Status: He is alert. He is disoriented.  Psychiatric:        Attention and Perception: He is inattentive.        Mood and Affect: Mood is elated.        Speech: Speech normal.        Behavior: Behavior is hyperactive.        Thought Content: Thought content is delusional.        Cognition and Memory: Cognition is impaired. Memory is impaired.        Judgment: Judgment is impulsive and inappropriate.     Review of Systems  Psychiatric/Behavioral: Positive for confusion and hallucinations. The patient is hyperactive.   All other systems reviewed and are negative.   Blood pressure (!) 161/106, pulse (!) 134, temperature 98.4 F (36.9 C), temperature source Oral, resp. rate 16, height 5\' 10"  (1.778 m), weight 76 kg, SpO2 100 %.Body mass index is 24.04 kg/m.  General Appearance: Bizarre and Disheveled  Eye Contact:  Minimal  Speech:  Clear and Coherent  Volume:  Normal  Mood:  NA  Affect:  Non-Congruent, Inappropriate and Full Range  Thought Process:  Disorganized and Descriptions of Associations: Tangential  Orientation:  Negative  Thought Content:  Illogical  Suicidal Thoughts:  No  Homicidal Thoughts:  No  Memory:  Immediate;   Poor  Judgement:  Impaired  Insight:  Lacking  Psychomotor Activity:  Normal  Concentration:  Attention Span: Poor  Recall:  Fair   Fund of Knowledge:  Poor  Language:  NA  Akathisia:  NA  Handed:  Right  AIMS (if indicated):     Assets:  Housing Social Support  ADL's:  Intact  Cognition:  Impaired,  Moderate  Sleep:        Treatment Plan Summary: Daily contact with patient to assess and evaluate symptoms and progress in treatment and Medication management  Disposition: Recommend psychiatric Inpatient admission when medically cleared. Supportive therapy provided about ongoing stressors. Discussed crisis plan, support from social network, calling 911, coming to the Emergency Department, and calling Suicide Hotline.  Jearld Lesch, NP 03/13/2020 10:08 PM

## 2020-03-13 NOTE — ED Triage Notes (Signed)
ED under IVC. Pt talking rapidly about multiple different subjects while in triage. 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 When asked if he wanted the hear what was stated on the IVC papers pt reported he did not need to know because he was an attorney as well.   Per IVC papers pt is Bipolar and acting manic. Pt has been refusing to go to the doctors and thinks he is the president of the [place]. Pt also threatened officer that he was going to cut the tips of his fingers off.

## 2020-03-13 NOTE — ED Provider Notes (Signed)
[facility] Emergency Department Provider Note  ____________________________________________  Time seen: Approximately 11:35 PM  I have reviewed the triage vital signs and the nursing notes.   HISTORY  Chief Complaint IVC and Delusional    Level 5 Caveat: Portions of the History and Physical including HPI and review of systems are unable to be completely obtained due to patient being a poor historian   HPI Austin Rhodes is a 70 y.o. male with a history of bipolar disorder, GERD, PTSD who is brought to the ED under involuntary commitment due to bizarre behavior.  Patient is floridly psychotic, exhibits mania, not able to offer any meaningful history.      Past Medical History:  Diagnosis Date  . Bipolar disorder (HCC)   . Coronary artery disease   . GERD (gastroesophageal reflux disease)   . History of multiple strokes   . PTSD (post-traumatic stress disorder)      Patient Active Problem List   Diagnosis Date Noted  . Bipolar disorder (HCC) 12/26/2018  . Head injury 12/25/2018  . Paranoid schizophrenia (HCC) 12/25/2018  . Schizophrenia, paranoid (HCC)   . Acute encephalopathy   . Affective psychosis, bipolar (HCC) 12/18/2018  . HTN (hypertension) 11/01/2017  . Bipolar I disorder, current or most recent episode manic, with psychotic features (HCC) 10/31/2017  . CAD (coronary artery disease) 08/31/2016  . GERD (gastroesophageal reflux disease) 08/31/2016  . History of CVA (cerebrovascular accident) 08/31/2016  . History of syncope 08/31/2016  . History of thrombocytopenia 08/31/2016  . Prediabetes 08/31/2016  . Chest pain 05/09/2015     Past Surgical History:  Procedure Laterality Date  . CARDIAC CATHETERIZATION       Prior to Admission medications   Medication Sig Start Date End Date Taking? Authorizing Provider  acetaminophen (TYLENOL) 325 MG tablet Take 2 tablets (650 mg total) by mouth every 6 (six) hours as needed for mild  pain or moderate pain. 12/25/18   Gertha Calkin, MD  clopidogrel (PLAVIX) 75 MG tablet Take 1 tablet (75 mg total) by mouth daily. 01/14/19   Money, Gerlene Burdock, FNP  divalproex (DEPAKOTE) 500 MG DR tablet Take 1 tablet (500 mg total) by mouth every morning. 01/13/19   Money, Gerlene Burdock, FNP  divalproex (DEPAKOTE) 500 MG DR tablet Take 2 tablets (1,000 mg total) by mouth every evening. 01/13/19   Money, Gerlene Burdock, FNP  hydrOXYzine (ATARAX/VISTARIL) 50 MG tablet Take 1 tablet (50 mg total) by mouth 3 (three) times daily as needed for anxiety. 01/13/19   Money, Gerlene Burdock, FNP  lamoTRIgine (LAMICTAL) 100 MG tablet Take 1 tablet (100 mg total) by mouth daily. 01/14/19   Money, Gerlene Burdock, FNP  metoprolol succinate (TOPROL-XL) 50 MG 24 hr tablet Take 1 tablet (50 mg total) by mouth daily. 01/14/19   Money, Gerlene Burdock, FNP  QUEtiapine (SEROQUEL) 200 MG tablet Take 5 tablets (1,000 mg total) by mouth at bedtime. 01/13/19   Money, Gerlene Burdock, FNP  rosuvastatin (CRESTOR) 10 MG tablet Take 1 tablet (10 mg total) by mouth daily. 11/09/17   Pucilowska, Braulio Conte B, MD  tamsulosin (FLOMAX) 0.4 MG CAPS capsule Take 1 capsule (0.4 mg total) by mouth daily after supper. 01/13/19   Money, Gerlene Burdock, FNP     Allergies Procaine   History reviewed. No pertinent family history.  Social History Social History   Tobacco Use  . Smoking status: Never Smoker  . Smokeless tobacco: Never Used  Vaping Use  . Vaping Use: Never used  Substance Use Topics  . Alcohol use: No  . Drug use: Never    Review of Systems Level 5 Caveat: Portions of the History and Physical including HPI and review of systems are unable to be completely obtained due to patient being a poor historian   Constitutional:   No known fever.  ENT:   No rhinorrhea. Cardiovascular:   No chest pain or syncope. Respiratory:   No dyspnea or cough. Gastrointestinal:   Negative for abdominal pain, vomiting and diarrhea.  Musculoskeletal:   Negative for focal pain  or swelling ____________________________________________   PHYSICAL EXAM:  VITAL SIGNS: ED Triage Vitals [03/13/20 1938]  Enc Vitals Group     BP (!) 161/106     Pulse Rate (!) 134     Resp 16     Temp 98.4 F (36.9 C)     Temp Source Oral     SpO2 100 %     Weight 167 lb 8.8 oz (76 kg)     Height 5\' 10"  (1.778 m)     Head Circumference      Peak Flow      Pain Score 0     Pain Loc      Pain Edu?      Excl. in GC?     Vital signs reviewed, nursing assessments reviewed.   Constitutional:   Awake and alert, not oriented.  Non-toxic appearance. Eyes:   Conjunctivae are normal. EOMI. PERRL. ENT      Head:   Normocephalic and atraumatic.      Nose:   No congestion/rhinnorhea.       Mouth/Throat:   MMM       Neck:   No meningismus. Full ROM.  Cardiovascular:   Tachycardia heart rate 130. Symmetric bilateral radial and DP pulses.  Cap refill less than 2 seconds. Respiratory:   Normal respiratory effort without tachypnea/retractions. Breath sounds are clear and equal bilaterally. No wheezes/rales/rhonchi. Musculoskeletal:   Normal range of motion in all extremities. No joint effusions.  No lower extremity tenderness.  No edema. Neurologic:   Normal speech, tangential thought.  Flight of ideas.  Delusions.  Elevated mood.  Exhibits grandiosity..  Motor grossly intact. Skin:    Skin is warm, dry and intact. No rash noted.  No petechiae, purpura, or bullae.  ____________________________________________    LABS (pertinent positives/negatives) (all labs ordered are listed, but only abnormal results are displayed) Labs Reviewed  COMPREHENSIVE METABOLIC PANEL - Abnormal; Notable for the following components:      Result Value   Potassium 3.1 (*)    CO2 17 (*)    Glucose, Bld 199 (*)    Creatinine, Ser 1.57 (*)    GFR, Estimated 48 (*)    Anion gap 18 (*)    All other components within normal limits  CBC - Abnormal; Notable for the following components:   WBC 12.6 (*)     Hemoglobin 17.3 (*)    All other components within normal limits  VALPROIC ACID LEVEL - Abnormal; Notable for the following components:   Valproic Acid Lvl 18 (*)    All other components within normal limits  RESP PANEL BY RT-PCR (FLU A&B, COVID) ARPGX2  ETHANOL  URINE DRUG SCREEN, QUALITATIVE ([facility] ONLY)   ____________________________________________   EKG    ____________________________________________    RADIOLOGY  No results found.  ____________________________________________   PROCEDURES Procedures  ____________________________________________    CLINICAL IMPRESSION / ASSESSMENT AND PLAN / ED COURSE  Medications ordered  in the ED: Medications  LORazepam (ATIVAN) tablet 2 mg (2 mg Oral Given 03/13/20 2118)  LORazepam (ATIVAN) tablet 2 mg (2 mg Oral Given 03/13/20 2223)  divalproex (DEPAKOTE) DR tablet 1,000 mg (1,000 mg Oral Given 03/13/20 2223)    Pertinent labs & imaging results that were available during my care of the patient were reviewed by me and considered in my medical decision making (see chart for details).   Austin Rhodes was evaluated in Emergency Department on 03/13/2020 for the symptoms described in the history of present illness. He was evaluated in the context of the global COVID-19 pandemic, which necessitated consideration that the patient might be at risk for infection with the SARS-CoV-2 virus that causes COVID-19. Institutional protocols and algorithms that pertain to the evaluation of patients at risk for COVID-19 are in a state of rapid change based on information released by regulatory bodies including the CDC and federal and state organizations. These policies and algorithms were followed during the patient's care in the ED.   Patient brought to the ED under involuntary commitment due to symptoms of mania.  Hemodynamically stable, tachycardia likely due to his agitation.  Will continue IVC pending psychiatry evaluation.  Depakote level is  subtherapeutic.  We'll give dose of Depakote as well as increments of Ativan until patient is calm.  The patient has been placed in psychiatric observation due to the need to provide a safe environment for the patient while obtaining psychiatric consultation and evaluation, as well as ongoing medical and medication management to treat the patient's condition.  The patient has been placed under full IVC at this time.       ____________________________________________   FINAL CLINICAL IMPRESSION(S) / ED DIAGNOSES    Final diagnoses:  Acute psychosis [facility])     ED Discharge Orders    None      Portions of this note were generated with dragon dictation software. Dictation errors may occur despite best attempts at proofreading.   Sharman Cheek, MD 03/13/20 [phone]
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
## 2020-03-14 ENCOUNTER — Emergency Department: Payer: Medicare Other

## 2020-03-14 ENCOUNTER — Inpatient Hospital Stay: Admission: AD | Admit: 2020-03-14 | Payer: Medicare Other | Source: Intra-hospital | Admitting: Psychiatry

## 2020-03-14 DIAGNOSIS — F312 Bipolar disorder, current episode manic severe with psychotic features: Secondary | ICD-10-CM | POA: Diagnosis not present

## 2020-03-14 LAB — RESP PANEL BY RT-PCR (FLU A&B, COVID) ARPGX2
Influenza A by PCR: NEGATIVE
Influenza B by PCR: NEGATIVE
SARS Coronavirus 2 by RT PCR: NEGATIVE

## 2020-03-14 LAB — LIPID PANEL
Cholesterol: 119 mg/dL (ref 0–200)
HDL: 52 mg/dL (ref >40)
LDL Cholesterol: 53 mg/dL (ref 0–99)
Total CHOL/HDL Ratio: 2.3 RATIO
Triglycerides: 72 mg/dL (ref <150)
VLDL: 14 mg/dL (ref 0–40)

## 2020-03-14 LAB — HEMOGLOBIN A1C
Hgb A1c MFr Bld: 5.6 % (ref 4.8–5.6)
Mean Plasma Glucose: 114.02 mg/dL

## 2020-03-14 MED ORDER — ZIPRASIDONE MESYLATE 20 MG IM SOLR
20.0000 mg | Freq: Once | INTRAMUSCULAR | Status: AC
  Administered 2020-03-14: 20 mg via INTRAMUSCULAR
  Filled 2020-03-14: qty 20

## 2020-03-14 MED ORDER — LORAZEPAM 2 MG/ML IJ SOLN: 2.0000 mg | Freq: Four times a day (QID) | INTRAMUSCULAR | Status: DC | PRN

## 2020-03-14 MED ORDER — DIVALPROEX SODIUM 500 MG PO DR TAB
750.0000 mg | DELAYED_RELEASE_TABLET | Freq: Two times a day (BID) | ORAL | Status: DC
  Filled 2020-03-14: qty 1

## 2020-03-14 MED ORDER — TAMSULOSIN HCL 0.4 MG PO CAPS: 0.4000 mg | ORAL_CAPSULE | Freq: Every day | ORAL | Status: DC

## 2020-03-14 MED ORDER — CLOPIDOGREL BISULFATE 75 MG PO TABS
75.0000 mg | ORAL_TABLET | Freq: Every day | ORAL | Status: DC
  Administered 2020-03-15: 75 mg via ORAL
  Filled 2020-03-14 (×2): qty 1

## 2020-03-14 MED ORDER — METOPROLOL SUCCINATE ER 50 MG PO TB24
50.0000 mg | ORAL_TABLET | Freq: Every day | ORAL | Status: DC
  Administered 2020-03-15: 50 mg via ORAL
  Filled 2020-03-14 (×2): qty 1

## 2020-03-14 MED ORDER — DROPERIDOL 2.5 MG/ML IJ SOLN
5.0000 mg | Freq: Once | INTRAMUSCULAR | Status: AC
  Administered 2020-03-14: 5 mg via INTRAMUSCULAR
  Filled 2020-03-14 (×2): qty 2

## 2020-03-14 MED ORDER — DROPERIDOL 2.5 MG/ML IJ SOLN: 5.0000 mg | Freq: Once | INTRAMUSCULAR | Status: DC

## 2020-03-14 MED ORDER — QUETIAPINE FUMARATE 200 MG PO TABS: 400.0000 mg | ORAL_TABLET | Freq: Every day | ORAL | Status: DC
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
## 2020-03-14 NOTE — TOC Initial Note (Signed)
Transition of Care [facility]) - Initial/Assessment Note    Patient Details  Name: Austin Rhodes MRN: [id number] Date of Birth: 1950-02-27  Transition of [facility]) CM/SW Contact:    Marina Goodell Phone Number:  [phone] 03/14/2020, 1:04 PM  Clinical Narrative:                  TOC is not currently following this patient.  Patient is IVC pending inpatient psychiatric placement.   CSW received request to contact patient's son Corky Blumstein [phone].  Mr. Hinch stated he wanted to update the hospital that the patient was recently evicted from his current home, and will not have a home to return to once he is discharged.  CSW explained to Mr Pilkenton that [facility] was not following the patient and the patient remained IVC pending inpatient psychiatric placement.  Mr. Barg stated "my father needs to be committed.  He can't live alone anymore.  He needs to be in a locked facility."  Mr. Sarver stated the patient is difficult to assist when he is experiencing a manic phase, and is generally non-compliant with his medications.  Mr. Tay stated, "my sister just can't take care of him anymore.  We've been dealing with this for 15 years."  CSW explained to Mr. Weinand TTS was currently covering patient and they would be looking for inpatient placement.  CSW let Mr. Shuck know neither TTS or TOC would not be able to guarantee placement.  Mr. Pursifull verbalized understanding.  CSW told Mr. Cokley that I would relay his concerns to someone in TTS and give them his contact information.  CSW told Mr. Ramond Craver if the patient was psychiatrically cleared and TOC was consulted, then I would contact him again. Mr. Fairhurst verbalized understanding.    CSW checked and there is currently noone from TTS assigned to this patient or to the [facility] ED.  CSW gave ED BHU/RN contact information for Mr. Yoak requesting she relay the contact information to TTS when they came by the [facility].         Patient Goals and CMS Choice        Expected Discharge Plan and Services                                                Prior Living Arrangements/Services                       Activities of Daily Living      Permission Sought/Granted                  Emotional Assessment              Admission diagnosis:  IVC Patient Active Problem List   Diagnosis Date Noted  . Bipolar disorder (HCC) 12/26/2018  . Head injury 12/25/2018  . Paranoid schizophrenia (HCC) 12/25/2018  . Schizophrenia, paranoid (HCC)   . Acute encephalopathy   . Affective psychosis, bipolar (HCC) 12/18/2018  . HTN (hypertension) 11/01/2017  . Bipolar I disorder, current or most recent episode manic, with psychotic features (HCC) 10/31/2017  . CAD (coronary artery disease) 08/31/2016  . GERD (gastroesophageal reflux disease) 08/31/2016  . History of CVA (cerebrovascular accident) 08/31/2016  . History of syncope 08/31/2016  . History of thrombocytopenia 08/31/2016  . Prediabetes 08/31/2016  . Chest  pain 05/09/2015   PCP:  Rinaldo Cloud, MD Pharmacy:   [facility] [phone] - [place], [place] - [address] AT [facility] [address] [place] [address] Phone: [phone] Fax: [phone]  [facility] [address] [place], [place] - [address] [facility] [address] [place] [address] Phone: [phone] Fax: [phone]  [facility] - [place], [place] - [address] [place] [address] Phone: [phone] Fax: [phone]     Social Determinants of Health (SDOH) Interventions    Readmission Risk Interventions No flowsheet data found.

## 2020-03-14 NOTE — BH Assessment (Signed)
Patient is to be admitted to [facility] by Dr. Toni Amend.  Attending Physician will be Dr. Neale Burly.   Patient has been assigned to room 314, by [facility] Charge Nurse Megan.
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
## 2020-03-14 NOTE — ED Notes (Signed)
Pt's daughter here to visit.  Patient become highly agitated and visit had to be cut short.  Pt did give verbal permission for his cell phone to be given to his daughter.

## 2020-03-14 NOTE — Consult Note (Signed)
[facility] Face-to-Face Psychiatry Consult   Reason for Consult: Consult for 70 year old man with a history of bipolar disorder who is currently manic Referring Physician: Fuller Plan Patient Identification: Austin Rhodes MRN:  [id number] Principal Diagnosis: Bipolar I disorder, current or most recent episode manic, with psychotic features (HCC) Diagnosis:  Principal Problem:   Bipolar I disorder, current or most recent episode manic, with psychotic features (HCC) Active Problems:   HTN (hypertension)   CAD (coronary artery disease)   Prediabetes   Total Time spent with patient: 1 hour  Subjective:   Austin Rhodes is a 70 y.o. male patient admitted with "do you know who I am?".  HPI: Patient seen chart reviewed.  Patient known from previous encounters.  70 year old man with a history of bipolar disorder petition by his daughter who reports that he is off his medicine and is manic and psychotic.  Patient was very difficult and uncooperative to interview.  He is agitated with racing thoughts and disorganized grandiose thoughts.  Instead of answering questions in the interview he goes on at length about how he owns the hospital or is the general of the whole [place] or things such as that.  Skipping from topic to topic.  Speaking quickly.  Evasive about recent medicine.  Denies alcohol or drug use.  Denies suicidal or homicidal ideation does not make physical threats although he frequently talks about suing her firing people.  Past Psychiatric History: Long history of bipolar disorder.  From what I understand even at his best he is usually hypomanic but this is worse than usual.  No reported history of suicidal behavior.  Risk to Self:   Risk to Others:   Prior Inpatient Therapy:   Prior Outpatient Therapy:    Past Medical History:  Past Medical History:  Diagnosis Date  . Bipolar disorder (HCC)   . Coronary artery disease   . GERD (gastroesophageal reflux disease)   . History  of multiple strokes   . PTSD (post-traumatic stress disorder)     Past Surgical History:  Procedure Laterality Date  . CARDIAC CATHETERIZATION     Family History: History reviewed. No pertinent family history. Family Psychiatric  History: None reported Social History:  Social History   Substance and Sexual Activity  Alcohol Use No     Social History   Substance and Sexual Activity  Drug Use Never    Social History   Socioeconomic History  . Marital status: Legally Separated    Spouse name: Not on file  . Number of children: Not on file  . Years of education: Not on file  . Highest education level: Not on file  Occupational History  . Not on file  Tobacco Use  . Smoking status: Never Smoker  . Smokeless tobacco: Never Used  Vaping Use  . Vaping Use: Never used  Substance and Sexual Activity  . Alcohol use: No  . Drug use: Never  . Sexual activity: Not on file  Other Topics Concern  . Not on file  Social History Narrative  . Not on file   Social Determinants of Health   Financial Resource Strain: Not on file  Food Insecurity: Not on file  Transportation Needs: Not on file  Physical Activity: Not on file  Stress: Not on file  Social Connections: Not on file   Additional Social History:    Allergies:   Allergies  Allergen Reactions  . Procaine Other (See Comments)    Labs:  Results for orders  placed or performed during the hospital encounter of 03/13/20 (from the past 48 hour(s))  Resp Panel by RT-PCR (Flu A&B, Covid) Nasopharyngeal Swab     Status: None   Collection Time: 03/13/20 10:13 AM   Specimen: Nasopharyngeal Swab; Nasopharyngeal(NP) swabs in vial transport medium  Result Value Ref Range   SARS Coronavirus 2 by RT PCR NEGATIVE NEGATIVE    Comment: (NOTE) SARS-CoV-2 target nucleic acids are NOT DETECTED.  The SARS-CoV-2 RNA is generally detectable in upper respiratory specimens during the acute phase of infection. The lowest concentration  of SARS-CoV-2 viral copies this assay can detect is 138 copies/mL. A negative result does not preclude SARS-Cov-2 infection and should not be used as the sole basis for treatment or other patient management decisions. A negative result may occur with  improper specimen collection/handling, submission of specimen other than nasopharyngeal swab, presence of viral mutation(s) within the areas targeted by this assay, and inadequate number of viral copies(<138 copies/mL). A negative result must be combined with clinical observations, patient history, and epidemiological information. The expected result is Negative.  Fact Sheet for Patients:  BloggerCourse.com  Fact Sheet for Healthcare Providers:  SeriousBroker.it  This test is no t yet approved or cleared by the [place] FDA and  has been authorized for detection and/or diagnosis of SARS-CoV-2 by FDA under an Emergency Use Authorization (EUA). This EUA will remain  in effect (meaning this test can be used) for the duration of the COVID-19 declaration under Section 564(b)(1) of the Act, 21 U.S.C.section 360bbb-3(b)(1), unless the authorization is terminated  or revoked sooner.       Influenza A by PCR NEGATIVE NEGATIVE   Influenza B by PCR NEGATIVE NEGATIVE    Comment: (NOTE) The Xpert Xpress SARS-CoV-2/FLU/RSV plus assay is intended as an aid in the diagnosis of influenza from Nasopharyngeal swab specimens and should not be used as a sole basis for treatment. Nasal washings and aspirates are unacceptable for Xpert Xpress SARS-CoV-2/FLU/RSV testing.  Fact Sheet for Patients: BloggerCourse.com  Fact Sheet for Healthcare Providers: SeriousBroker.it  This test is not yet approved or cleared by the [place] FDA and has been authorized for detection and/or diagnosis of SARS-CoV-2 by FDA under an Emergency Use Authorization  (EUA). This EUA will remain in effect (meaning this test can be used) for the duration of the COVID-19 declaration under Section 564(b)(1) of the Act, 21 U.S.C. section 360bbb-3(b)(1), unless the authorization is terminated or revoked.  Performed at [facility], [address] [place], [place] [address]   Comprehensive metabolic panel     Status: Abnormal   Collection Time: 03/13/20  7:44 PM  Result Value Ref Range   Sodium 141 135 - 145 mmol/L   Potassium 3.1 (L) 3.5 - 5.1 mmol/L   Chloride 106 98 - 111 mmol/L   CO2 17 (L) 22 - 32 mmol/L   Glucose, Bld 199 (H) 70 - 99 mg/dL    Comment: Glucose reference range applies only to samples taken after fasting for at least 8 hours.   BUN 15 8 - 23 mg/dL   Creatinine, Ser 7.35 (H) 0.61 - 1.24 mg/dL   Calcium 9.8 8.9 - 67.0 mg/dL   Total Protein 7.4 6.5 - 8.1 g/dL   Albumin 4.5 3.5 - 5.0 g/dL   AST 32 15 - 41 U/L   ALT 18 0 - 44 U/L   Alkaline Phosphatase 68 38 - 126 U/L   Total Bilirubin 1.0 0.3 - 1.2 mg/dL  GFR, Estimated 48 (L) >60 mL/min    Comment: (NOTE) Calculated using the CKD-EPI Creatinine Equation (2021)    Anion gap 18 (H) 5 - 15    Comment: Performed at [facility], [address] [place], [place] [address]  Ethanol     Status: None   Collection Time: 03/13/20  7:44 PM  Result Value Ref Range   Alcohol, Ethyl (B) <10 <10 mg/dL    Comment: (NOTE) Lowest detectable limit for serum alcohol is 10 mg/dL.  For medical purposes only. Performed at [facility], [address] [place], [place] [address]   cbc     Status: Abnormal   Collection Time: 03/13/20  7:44 PM  Result Value Ref Range   WBC 12.6 (H) 4.0 - 10.5 K/uL   RBC 5.78 4.22 - 5.81 MIL/uL   Hemoglobin 17.3 (H) 13.0 - 17.0 g/dL   HCT 09.850.4 11.939.0 - 14.752.0 %   MCV 87.2 80.0 - 100.0 fL   MCH 29.9 26.0 - 34.0 pg   MCHC 34.3 30.0 - 36.0 g/dL   RDW 82.913.4 56.211.5 - 13.015.5 %   Platelets 205 150 - 400 K/uL   nRBC 0.0 0.0 - 0.2 %     Comment: Performed at [facility], [address] [place], [place] [address]  Valproic acid level     Status: Abnormal   Collection Time: 03/13/20  8:58 PM  Result Value Ref Range   Valproic Acid Lvl 18 (L) 50.0 - 100.0 ug/mL    Comment: Performed at [facility], [address] [place], [place] [address]    Current Facility-Administered Medications  Medication Dose Route Frequency Provider Last Rate Last Admin  . clopidogrel (PLAVIX) tablet 75 mg  75 mg Oral Daily Clapacs, John T, MD      . divalproex (DEPAKOTE) DR tablet 750 mg  750 mg Oral Q12H Clapacs, John T, MD      . metoprolol succinate (TOPROL-XL) 24 hr tablet 50 mg  50 mg Oral Daily Clapacs, John T, MD      . QUEtiapine (SEROQUEL) tablet 400 mg  400 mg Oral QHS Clapacs, John T, MD      . tamsulosin (FLOMAX) capsule 0.4 mg  0.4 mg Oral QPC supper Clapacs, Jackquline DenmarkJohn T, MD       Current Outpatient Medications  Medication Sig Dispense Refill  . acetaminophen (TYLENOL) 325 MG tablet Take 2 tablets (650 mg total) by mouth every 6 (six) hours as needed for mild pain or moderate pain. 30 tablet 0  . clopidogrel (PLAVIX) 75 MG tablet Take 1 tablet (75 mg total) by mouth daily. 30 tablet 1  . divalproex (DEPAKOTE) 500 MG DR tablet Take 1 tablet (500 mg total) by mouth every morning. 30 tablet 1  . divalproex (DEPAKOTE) 500 MG DR tablet Take 2 tablets (1,000 mg total) by mouth every evening. 60 tablet 1  . hydrOXYzine (ATARAX/VISTARIL) 50 MG tablet Take 1 tablet (50 mg total) by mouth 3 (three) times daily as needed for anxiety. 30 tablet 1  . lamoTRIgine (LAMICTAL) 100 MG tablet Take 1 tablet (100 mg total) by mouth daily. 30 tablet 1  . metoprolol succinate (TOPROL-XL) 50 MG 24 hr tablet Take 1 tablet (50 mg total) by mouth daily. 30 tablet 1  . QUEtiapine (SEROQUEL) 200 MG tablet Take 5 tablets (1,000 mg total) by mouth at bedtime. 150 tablet 1  . rosuvastatin (CRESTOR) 10 MG tablet Take 1 tablet (10 mg total) by mouth  daily. 30 tablet 1  . tamsulosin (FLOMAX) 0.4 MG CAPS capsule Take 1 capsule (0.4 mg total) by mouth daily after supper. 30 capsule 1    Musculoskeletal: Strength & Muscle Tone: within normal limits Gait & Station: normal Patient leans: N/A  Psychiatric Specialty Exam: Physical Exam Vitals and nursing note reviewed.  Constitutional:      Appearance: He is well-developed and well-nourished.  HENT:     Head: Normocephalic and atraumatic.  Eyes:     Conjunctiva/sclera: Conjunctivae normal.     Pupils: Pupils are equal, round, and reactive to light.  Cardiovascular:     Heart sounds: Normal heart sounds.  Pulmonary:     Effort: Pulmonary effort is normal.  Abdominal:     Palpations: Abdomen is soft.  Musculoskeletal:        General: Normal range of motion.     Cervical back: Normal range of motion.  Skin:    General: Skin is warm and dry.  Neurological:     General: No focal deficit present.     Mental Status: He is alert.  Psychiatric:        Attention and Perception: He is inattentive.        Mood and Affect: Mood is elated. Affect is labile, angry and inappropriate.        Speech: Speech is rapid and pressured.        Behavior: Behavior is agitated. Behavior is not aggressive.        Thought Content: Thought content is paranoid and delusional.        Cognition and Memory: Cognition is impaired. Memory is impaired.        Judgment: Judgment is impulsive.     Review of Systems  Constitutional: Negative.   HENT: Negative.   Eyes: Negative.   Respiratory: Negative.   Cardiovascular: Negative.   Gastrointestinal: Negative.   Musculoskeletal: Negative.   Skin: Negative.   Neurological: Negative.   Psychiatric/Behavioral: Positive for dysphoric mood and sleep disturbance. The patient is nervous/anxious.     Blood pressure 120/82, pulse 84, temperature 98.1 F (36.7 C), temperature source Oral, resp. rate 18, height 5\' 10"  (1.778 m), weight 76 kg, SpO2 99 %.Body mass  index is 24.04 kg/m.  General Appearance: Disheveled  Eye Contact:  Fair  Speech:  Pressured  Volume:  Increased  Mood:  Dysphoric and Irritable  Affect:  Labile  Thought Process:  Disorganized  Orientation:  Negative  Thought Content:  Illogical and Delusions  Suicidal Thoughts:  No  Homicidal Thoughts:  No  Memory:  Immediate;   Fair Recent;   Poor Remote;   Poor  Judgement:  Impaired  Insight:  Shallow  Psychomotor Activity:  Restlessness  Concentration:  Concentration: Poor  Recall:  Poor  Fund of Knowledge:  Poor  Language:  Poor  Akathisia:  No  Handed:  Right  AIMS (if indicated):     Assets:  Housing  ADL's:  Impaired  Cognition:  Impaired,  Mild  Sleep:        Treatment Plan Summary: Medication management and Plan Labs reviewed.  Patient interviewed.  Patient is clearly manic with psychotic symptoms.  Valproic level is low.  Probably has not been on any of his medicine.  Labs have been checked.  Lipid panel and hemoglobin A1c and EKG ordered.  Restart Depakote and Seroquel.  Restart usual medicines for blood pressure and blood sugar.  As needed medicine will be available.  Plan for admission to the psychiatric  unit as soon as stable enough that the unit can accept him.  Orders completed.  Case reviewed with TTS and ER physician  Disposition: Recommend psychiatric Inpatient admission when medically cleared. Supportive therapy provided about ongoing stressors.  Mordecai Rasmussen, MD 03/14/2020 1:39 PM

## 2020-03-14 NOTE — BH Assessment (Signed)
Patient to be reviewed with [facility] BMU on 03/14/20 during day shift. TTS to follow up 
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
## 2020-03-14 NOTE — BH Assessment (Signed)
Writer spoke with the patient to complete an updated/reassessment.  Patient meets inpatient criteria. 

## 2020-03-14 NOTE — ED Provider Notes (Signed)
Emergency Medicine Observation Re-evaluation Note  Austin Rhodes is a 70 y.o. male, seen on rounds today.  Pt initially presented to the ED for complaints of IVC and Delusional  Currently, the patient is is no acute distress. Denies any concerns at this time.  Patient was found laying on the ground in his room.  He was able to get out of bed and had an unwitnessed fall.  Patient is very unsteady on his feet.  He is able to bear weight on his legs and has no arm, chest, abdominal tenderness.  Given patient's age and him not being the most reliable source we will proceed with CT imaging of his head to make sure no intracranial hemorrhage  Physical Exam  Blood pressure (!) 161/106, pulse (!) 134, temperature 98.4 F (36.9 C), temperature source Oral, resp. rate 16, height 5\' 10"  (1.778 m), weight 76 kg, SpO2 100 %.  Physical Exam General: No apparent distress HEENT: moist mucous membranes CV: RRR Pulm: Normal WOB GI: soft and non tender MSK: no edema or cyanosis Neuro: face symmetric, moving all extremities     ED Course / MDM     I have reviewed the labs performed to date as well as medications administered while in observation.  Recent changes in the last 24 hours include   Plan   We will get CT head/CT cervical given patients unwitnessed fall and he is not a great historian to know if he hit his head or not.  No obvious signs of trauma.  Patient is able to bear weight on his legs but seems very unsteady.  Will get a sitter at bedside.  Repeat vital signs were reassuring.  Patient is no longer tachycardic.  Current plan is to continue to wait for psych plan/placement if felt warranted  Patient is under full IVC at this time.   , MD 03/14/20 [phone]
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
## 2020-03-14 NOTE — ED Notes (Signed)
Pt allowed to use the phone. 

## 2020-03-14 NOTE — ED Notes (Signed)
Pt refusing his medications, stating he already took them this morning. Pt then tells this writer to put the pills in a plastic bag so he can send then off to find out what they are.
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
## 2020-03-14 NOTE — ED Notes (Signed)
Patient denies SI/HI/AVH. Patient refusing to dress out. Patient currently has some pants and shoes. Belongings removed out of pockets. Patient tells this Clinical research associate, "You are going to be my 26th wife and have my two babies." Patient talking about he is a special agent and we cannot touch him if staff does we will go to jail.   ENVIRONMENTAL ASSESSMENT Potentially harmful objects out of patient reach: Yes.   Personal belongings secured: Yes.   Patient dressed in hospital provided attire only: Yes.   Plastic bags out of patient reach: Yes.   Patient care equipment (cords, cables, call bells, lines, and drains) shortened, removed, or accounted for: Yes.   Equipment and supplies removed from bottom of stretcher: Yes.   Potentially toxic materials out of patient reach: Yes.   Sharps container removed or out of patient reach: Yes.

## 2020-03-14 NOTE — ED Notes (Signed)
Pt will not allow VS at this time.

## 2020-03-14 NOTE — ED Notes (Signed)
RN and NT heard loud "thud" while at the nursing desk.  Pt found laying on his side on the floor near the door of his room.  Pt denied hitting his head, Denied any pain. No visual injuries.Pt able to get back to his bed.   EDP at bedside to assess pt.  VS stable.  Charge nurse made aware.

## 2020-03-14 NOTE — ED Notes (Signed)
Pt needing frequent redirection to stay in his room.  Pt insists he needs to leave.  Pt told this Clinical research associate he makes the rules of the hospital.

## 2020-03-15 ENCOUNTER — Inpatient Hospital Stay
Admission: AD | Admit: 2020-03-15 | Discharge: 2020-03-27 | DRG: 885 | Disposition: A | Discharge status: Home / Self Care | Payer: Medicare Other | Source: Intra-hospital | Attending: Behavioral Health | Admitting: Behavioral Health

## 2020-03-15 ENCOUNTER — Other Ambulatory Visit: Payer: Self-pay

## 2020-03-15 ENCOUNTER — Encounter: Payer: Self-pay | Admitting: Psychiatry

## 2020-03-15 DIAGNOSIS — K219 Gastro-esophageal reflux disease without esophagitis: Secondary | ICD-10-CM | POA: Diagnosis present

## 2020-03-15 DIAGNOSIS — I10 Essential (primary) hypertension: Secondary | ICD-10-CM | POA: Diagnosis present

## 2020-03-15 DIAGNOSIS — Z8673 Personal history of transient ischemic attack (TIA), and cerebral infarction without residual deficits: Secondary | ICD-10-CM

## 2020-03-15 DIAGNOSIS — N400 Enlarged prostate without lower urinary tract symptoms: Secondary | ICD-10-CM | POA: Diagnosis present

## 2020-03-15 DIAGNOSIS — I471 Supraventricular tachycardia: Secondary | ICD-10-CM | POA: Diagnosis present

## 2020-03-15 DIAGNOSIS — F312 Bipolar disorder, current episode manic severe with psychotic features: Secondary | ICD-10-CM | POA: Diagnosis present

## 2020-03-15 DIAGNOSIS — F29 Unspecified psychosis not due to a substance or known physiological condition: Secondary | ICD-10-CM | POA: Diagnosis present

## 2020-03-15 DIAGNOSIS — G4700 Insomnia, unspecified: Secondary | ICD-10-CM | POA: Diagnosis present

## 2020-03-15 DIAGNOSIS — Z20822 Contact with and (suspected) exposure to covid-19: Secondary | ICD-10-CM | POA: Diagnosis present

## 2020-03-15 LAB — LIPID PANEL
Cholesterol: 115 mg/dL (ref 0–200)
HDL: 52 mg/dL (ref >40)
LDL Cholesterol: 40 mg/dL (ref 0–99)
Total CHOL/HDL Ratio: 2.2 RATIO
Triglycerides: 117 mg/dL (ref <150)
VLDL: 23 mg/dL (ref 0–40)

## 2020-03-15 LAB — HEMOGLOBIN A1C
Hgb A1c MFr Bld: 5.5 % (ref 4.8–5.6)
Mean Plasma Glucose: 111.15 mg/dL

## 2020-03-15 MED ORDER — DIVALPROEX SODIUM 500 MG PO DR TAB
750.0000 mg | DELAYED_RELEASE_TABLET | Freq: Two times a day (BID) | ORAL | Status: DC
  Administered 2020-03-15 – 2020-03-16 (×2): 750 mg via ORAL
  Administered 2020-03-18: 250 mg via ORAL
  Filled 2020-03-15 (×5): qty 1

## 2020-03-15 MED ORDER — MAGNESIUM HYDROXIDE 400 MG/5ML PO SUSP
30.0000 mL | Freq: Every day | ORAL | Status: DC | PRN
  Administered 2020-03-25 – 2020-03-26 (×2): 30 mL via ORAL
  Filled 2020-03-15 (×2): qty 30

## 2020-03-15 MED ORDER — METOPROLOL SUCCINATE ER 25 MG PO TB24
50.0000 mg | ORAL_TABLET | Freq: Every day | ORAL | Status: DC
  Administered 2020-03-16 – 2020-03-27 (×12): 50 mg via ORAL
  Filled 2020-03-15 (×12): qty 2

## 2020-03-15 MED ORDER — OLANZAPINE 10 MG PO TABS
10.0000 mg | ORAL_TABLET | Freq: Four times a day (QID) | ORAL | Status: DC | PRN
  Administered 2020-03-15 – 2020-03-25 (×13): 10 mg via ORAL
  Filled 2020-03-15 (×14): qty 1

## 2020-03-15 MED ORDER — ZIPRASIDONE MESYLATE 20 MG IM SOLR
20.0000 mg | Freq: Four times a day (QID) | INTRAMUSCULAR | Status: DC | PRN
  Administered 2020-03-18 – 2020-03-23 (×4): 20 mg via INTRAMUSCULAR
  Filled 2020-03-15 (×5): qty 20

## 2020-03-15 MED ORDER — ACETAMINOPHEN 325 MG PO TABS
650.0000 mg | ORAL_TABLET | Freq: Four times a day (QID) | ORAL | Status: DC | PRN
  Administered 2020-03-17 – 2020-03-24 (×6): 650 mg via ORAL
  Filled 2020-03-15 (×6): qty 2

## 2020-03-15 MED ORDER — CLOPIDOGREL BISULFATE 75 MG PO TABS
75.0000 mg | ORAL_TABLET | Freq: Every day | ORAL | Status: DC
  Administered 2020-03-16 – 2020-03-27 (×12): 75 mg via ORAL
  Filled 2020-03-15 (×12): qty 1

## 2020-03-15 MED ORDER — LORAZEPAM 2 MG/ML IJ SOLN: 2.0000 mg | Freq: Four times a day (QID) | INTRAMUSCULAR | Status: DC | PRN

## 2020-03-15 MED ORDER — ALUM & MAG HYDROXIDE-SIMETH 200-200-20 MG/5ML PO SUSP: 30.0000 mL | ORAL | Status: DC | PRN

## 2020-03-15 MED ORDER — TAMSULOSIN HCL 0.4 MG PO CAPS
0.4000 mg | ORAL_CAPSULE | Freq: Every day | ORAL | Status: DC
  Administered 2020-03-15 – 2020-03-26 (×10): 0.4 mg via ORAL
  Filled 2020-03-15 (×14): qty 1

## 2020-03-15 MED ORDER — QUETIAPINE FUMARATE 200 MG PO TABS
400.0000 mg | ORAL_TABLET | Freq: Every day | ORAL | Status: DC
  Administered 2020-03-15 – 2020-03-26 (×11): 400 mg via ORAL
  Filled 2020-03-15 (×11): qty 2

## 2020-03-15 NOTE — Progress Notes (Signed)
Pt was not a great historian. He constantly speaks of his grandeur delusions. He is redirectable. Pt is calm and cooperative. Pt has no complaints except to discharge. Pt has been educated on care plan and verbalizes understanding. Torrie Mayers RN

## 2020-03-15 NOTE — ED Notes (Signed)
Pt's daughter, [name], called to get an update on the patient.  Update given by this Clinical research associate.
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
## 2020-03-15 NOTE — ED Notes (Signed)
Pt ambulated to bathroom without difficulty.

## 2020-03-15 NOTE — ED Notes (Signed)
Pt discharged to BMU. VS stable.  All belongings will be sent with patient.  Report given to [place], Charity fundraiser.
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
## 2020-03-15 NOTE — [facility] Group Notes (Signed)
LCSW Group Therapy Note  03/15/2020 2:08 PM  Type of Therapy and Topic:  Group Therapy:  Feelings around Relapse and Recovery  Participation Level:  Did Not Attend   Description of Group:    Patients in this group will discuss emotions they experience before and after a relapse. They will process how experiencing these feelings, or avoidance of experiencing them, relates to having a relapse. Facilitator will guide patients to explore emotions they have related to recovery. Patients will be encouraged to process which emotions are more powerful. They will be guided to discuss the emotional reaction significant others in their lives may have to their relapse or recovery. Patients will be assisted in exploring ways to respond to the emotions of others without this contributing to a relapse.  Therapeutic Goals: 1. Patient will identify two or more emotions that lead to a relapse for them 2. Patient will identify two emotions that result when they relapse 3. Patient will identify two emotions related to recovery 4. Patient will demonstrate ability to communicate their needs through discussion and/or role plays   Summary of Patient Progress: X  Therapeutic Modalities:   Cognitive Behavioral Therapy Solution-Focused Therapy Assertiveness Training Relapse Prevention Therapy   Emrick Hensch, MSW, LCSW 03/15/2020 2:08 PM  

## 2020-03-15 NOTE — [facility] Group Notes (Signed)
[facility] Group Notes:  (Nursing/MHT/Case Management/Adjunct)  Date:  03/15/2020  Time:  9:17 PM  Type of Therapy:  Group Therapy  Participation Level:  None  Participation Quality:  Left out of group unaware of what going on.   Mayra Neer 03/15/2020, 9:17 PM

## 2020-03-15 NOTE — ED Notes (Signed)
Pt is asleep. Vs will be assess when pt is awake.
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
## 2020-03-15 NOTE — ED Notes (Signed)
Pt demanding his clothing.

## 2020-03-15 NOTE — Tx Team (Signed)
Initial Treatment Plan 03/15/2020 4:06 PM [name] PNP:[id number]    PATIENT STRESSORS: Financial difficulties Health problems   PATIENT STRENGTHS: Average or above average intelligence Communication skills General fund of knowledge Supportive family/friends   PATIENT IDENTIFIED PROBLEMS: psychosis                      DISCHARGE CRITERIA:  Ability to meet basic life and health needs Adequate post-discharge living arrangements Improved stabilization in mood, thinking, and/or behavior Medical problems require only outpatient monitoring Need for constant or close observation no longer present Verbal commitment to aftercare and medication compliance  PRELIMINARY DISCHARGE PLAN: Outpatient therapy  PATIENT/FAMILY INVOLVEMENT: This treatment plan has been presented to and reviewed with the patient, Austin Rhodes The patient has been given the opportunity to ask questions and make suggestions.  Chalmers Cater, RN 03/15/2020, 4:06 PM

## 2020-03-15 NOTE — ED Notes (Signed)
Pt continues to walk out of his room.  Redirectable at this time.

## 2020-03-15 NOTE — ED Notes (Signed)
Meal tray given 

## 2020-03-16 DIAGNOSIS — F312 Bipolar disorder, current episode manic severe with psychotic features: Secondary | ICD-10-CM | POA: Diagnosis not present

## 2020-03-16 NOTE — Plan of Care (Signed)
  Problem: Safety: Goal: Violent Restraint(s) Outcome: Not Progressing   Problem: Education: Goal: Knowledge of [facility] Education information/materials will improve Outcome: Not Progressing Goal: Emotional status will improve Outcome: Not Progressing Goal: Mental status will improve Outcome: Not Progressing Goal: Verbalization of understanding the information provided will improve Outcome: Not Progressing   Problem: Activity: Goal: Interest or engagement in activities will improve Outcome: Not Progressing Goal: Sleeping patterns will improve Outcome: Not Progressing   Problem: Coping: Goal: Ability to verbalize frustrations and anger appropriately will improve Outcome: Not Progressing Goal: Ability to demonstrate self-control will improve Outcome: Not Progressing   Problem: Health Behavior/Discharge Planning: Goal: Identification of resources available to assist in meeting health care needs will improve Outcome: Not Progressing Goal: Compliance with treatment plan for underlying cause of condition will improve Outcome: Not Progressing   Problem: Physical Regulation: Goal: Ability to maintain clinical measurements within normal limits will improve Outcome: Not Progressing   Problem: Safety: Goal: Periods of time without injury will increase Outcome: Not Progressing   Problem: Activity: Goal: Will verbalize the importance of balancing activity with adequate rest periods Outcome: Not Progressing   Problem: Education: Goal: Will be free of psychotic symptoms Outcome: Not Progressing Goal: Knowledge of the prescribed therapeutic regimen will improve Outcome: Not Progressing   Problem: Coping: Goal: Coping ability will improve Outcome: Not Progressing Goal: Will verbalize feelings Outcome: Not Progressing   Problem: Health Behavior/Discharge Planning: Goal: Compliance with prescribed medication regimen will improve Outcome: Not Progressing   Problem:  Nutritional: Goal: Ability to achieve adequate nutritional intake will improve Outcome: Not Progressing   Problem: Role Relationship: Goal: Ability to communicate needs accurately will improve Outcome: Not Progressing Goal: Ability to interact with others will improve Outcome: Not Progressing   Problem: Safety: Goal: Ability to redirect hostility and anger into socially appropriate behaviors will improve Outcome: Not Progressing Goal: Ability to remain free from injury will improve Outcome: Not Progressing   Problem: Self-Care: Goal: Ability to participate in self-care as condition permits will improve Outcome: Not Progressing   Problem: Self-Concept: Goal: Will verbalize positive feelings about self Outcome: Not Progressing   
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
## 2020-03-16 NOTE — Progress Notes (Signed)
Patient refused his Depakote because he thinks it gave him leg cramps. When told leg cramps are not a side effect of Depakote, he said "I don't want to take that now because I have to go to a reception at my house. Call Aundra Millet or my daughter Amil Amen to pick me up because I'm leaving".

## 2020-03-16 NOTE — Progress Notes (Signed)
[facility] MD Progress Note   03/16/2020 12:58 PM Austin Rhodes  MRN:  [id number] Subjective: Follow-up this patient with bipolar disorder.  Patient has continued to be hyperverbal paranoid psychotic and agitated.  When awake he will very easily long Austin Rhodes to tirades about his grandiose psychotic believes.  He has been noncompliant with medication.  He did manage to get a little sleep last night after being given olanzapine even though he remains noncompliant with appropriate recommended medication.  No new physical complaints.  Vitals appear to be stable.  EKG appropriate.  Has not been aggressive to others physically although he will posture at times when he gets agitated. Principal Problem: Bipolar affective disorder, current episode manic with psychotic symptoms (HCC) Diagnosis: Principal Problem:   Bipolar affective disorder, current episode manic with psychotic symptoms (HCC) Active Problems:   HTN (hypertension)   GERD (gastroesophageal reflux disease)   History of CVA (cerebrovascular accident)   BPH (benign prostatic hyperplasia)   SVT (supraventricular tachycardia) (HCC)  Total Time spent with patient: 30 minutes  Past Psychiatric History: Past history of chronic hypomania and recurrent episodes of psychotic mania as part of bipolar disorder  Past Medical History:  Past Medical History:  Diagnosis Date  . Bipolar disorder (HCC)   . Coronary artery disease   . GERD (gastroesophageal reflux disease)   . History of multiple strokes   . PTSD (post-traumatic stress disorder)     Past Surgical History:  Procedure Laterality Date  . CARDIAC CATHETERIZATION     Family History: History reviewed. No pertinent family history. Family Psychiatric  History: See previous Social History:  Social History   Substance and Sexual Activity  Alcohol Use No     Social History   Substance and Sexual Activity  Drug Use Never    Social History   Socioeconomic History  . Marital status:  Legally Separated    Spouse name: Not on file  . Number of children: Not on file  . Years of education: Not on file  . Highest education level: Not on file  Occupational History  . Not on file  Tobacco Use  . Smoking status: Never Smoker  . Smokeless tobacco: Never Used  Vaping Use  . Vaping Use: Never used  Substance and Sexual Activity  . Alcohol use: No  . Drug use: Never  . Sexual activity: Not on file  Other Topics Concern  . Not on file  Social History Narrative  . Not on file   Social Determinants of Health   Financial Resource Strain: Not on file  Food Insecurity: Not on file  Transportation Needs: Not on file  Physical Activity: Not on file  Stress: Not on file  Social Connections: Not on file   Additional Social History:                         Sleep: Fair  Appetite:  Fair  Current Medications: Current Facility-Administered Medications  Medication Dose Route Frequency Provider Last Rate Last Admin  . acetaminophen (TYLENOL) tablet 650 mg  650 mg Oral Q6H PRN Jannetta Massey T, MD      . alum & mag hydroxide-simeth (MAALOX/MYLANTA) 200-200-20 MG/5ML suspension 30 mL  30 mL Oral Q4H PRN Jeffery Bachmeier T, MD      . clopidogrel (PLAVIX) tablet 75 mg  75 mg Oral Daily Shahmeer Bunn T, MD      . divalproex (DEPAKOTE) DR tablet 750 mg  750 mg Oral Q12H  Chela Sutphen, Jackquline DenmarkJohn T, MD   750 mg at 03/15/20 1923  . magnesium hydroxide (MILK OF MAGNESIA) suspension 30 mL  30 mL Oral Daily PRN Chieko Neises T, MD      . metoprolol succinate (TOPROL-XL) 24 hr tablet 50 mg  50 mg Oral Daily Taina Landry T, MD      . OLANZapine (ZYPREXA) tablet 10 mg  10 mg Oral Q6H PRN Jesse SansFreeman, Megan M, MD   10 mg at 03/15/20 2116  . QUEtiapine (SEROQUEL) tablet 400 mg  400 mg Oral QHS Ritta Hammes, Jackquline DenmarkJohn T, MD   400 mg at 03/15/20 2116  . tamsulosin (FLOMAX) capsule 0.4 mg  0.4 mg Oral QPC supper Eleny Cortez, Jackquline DenmarkJohn T, MD   0.4 mg at 03/15/20 1733  . ziprasidone (GEODON) injection 20 mg  20 mg  Intramuscular Q6H PRN Jesse SansFreeman, Megan M, MD        Lab Results:  Results for orders placed or performed during the hospital encounter of 03/15/20 (from the past 48 hour(s))  Lipid panel     Status: None   Collection Time: 03/15/20  2:14 PM  Result Value Ref Range   Cholesterol 115 0 - 200 mg/dL   Triglycerides 161117 <096<150 mg/dL   HDL 52 >04>40 mg/dL   Total CHOL/HDL Ratio 2.2 RATIO   VLDL 23 0 - 40 mg/dL   LDL Cholesterol 40 0 - 99 mg/dL    Comment:        Total Cholesterol/HDL:CHD Risk Coronary Heart Disease Risk Table                     Men   Women  1/2 Average Risk   3.4   3.3  Average Risk       5.0   4.4  2 X Average Risk   9.6   7.1  3 X Average Risk  23.4   11.0        Use the calculated Patient Ratio above and the CHD Risk Table to determine the patient's CHD Risk.        ATP III CLASSIFICATION (LDL):  <100     mg/dL   Optimal  540-981100-129  mg/dL   Near or Above                    Optimal  130-159  mg/dL   Borderline  191-478160-189  mg/dL   High  >295>190     mg/dL   Very High Performed at [facility], [address] [place], [place] [address]   Hemoglobin A1c     Status: None   Collection Time: 03/15/20  2:14 PM  Result Value Ref Range   Hgb A1c MFr Bld 5.5 4.8 - 5.6 %    Comment: (NOTE) Pre diabetes:          5.7%-6.4%  Diabetes:              >6.4%  Glycemic control for   <7.0% adults with diabetes    Mean Plasma Glucose 111.15 mg/dL    Comment: Performed at [facility], [address] [place], [place] [address]    Blood Alcohol level:  Lab Results  Component Value Date   [facility] 03[facility]   ETH <10 12/16/2018    Metabolic Disorder Labs: Lab Results  Component Value Date   HGBA1C 5.5 03/15/2020   MPG 111.15 03/15/2020   MPG 114.02 03/13/2020   No results found for: PROLACTIN Lab Results  Component Value Date   CHOL 115 03/15/2020   TRIG 117 03/15/2020   HDL 52 03/15/2020   CHOLHDL 2.2 03/15/2020   VLDL 23 03/15/2020   LDLCALC 40  03/15/2020   LDLCALC 53 03/13/2020    Physical Findings: AIMS:  , ,  ,  ,    CIWA:    COWS:     Musculoskeletal: Strength & Muscle Tone: decreased Gait & Station: normal Patient leans: N/A  Psychiatric Specialty Exam: Physical Exam Vitals and nursing note reviewed.  Constitutional:      Appearance: He is well-developed and well-nourished.  HENT:     Head: Normocephalic and atraumatic.  Eyes:     Conjunctiva/sclera: Conjunctivae normal.     Pupils: Pupils are equal, round, and reactive to light.  Cardiovascular:     Heart sounds: Normal heart sounds.  Pulmonary:     Effort: Pulmonary effort is normal.  Abdominal:     Palpations: Abdomen is soft.  Musculoskeletal:        General: Normal range of motion.     Cervical back: Normal range of motion.  Skin:    General: Skin is warm and dry.  Neurological:     General: No focal deficit present.     Mental Status: He is alert.  Psychiatric:        Attention and Perception: He is inattentive.        Mood and Affect: Affect is labile, angry and inappropriate.        Speech: Speech is rapid and pressured.        Behavior: Behavior is agitated.        Thought Content: Thought content is paranoid and delusional.        Cognition and Memory: Cognition is impaired. Memory is impaired.        Judgment: Judgment is inappropriate.     Review of Systems  Constitutional: Negative.   HENT: Negative.   Eyes: Negative.   Respiratory: Negative.   Cardiovascular: Negative.   Gastrointestinal: Negative.   Musculoskeletal: Negative.   Skin: Negative.   Neurological: Negative.   Psychiatric/Behavioral: Positive for behavioral problems and confusion. The patient is hyperactive.     Blood pressure 120/89, pulse 99, temperature 98.9 F (37.2 C), temperature source Oral, resp. rate 18, height 5\' 10"  (1.778 m), weight 71.2 kg, SpO2 99 %.Body mass index is 22.53 kg/m.  General Appearance: Disheveled  Eye Contact:  Fair  Speech:   Pressured  Volume:  Increased  Mood:  Irritable  Affect:  Inappropriate  Thought Process:  Disorganized  Orientation:  Full (Time, Place, and Person)  Thought Content:  Illogical, Delusions and Paranoid Ideation  Suicidal Thoughts:  No  Homicidal Thoughts:  No  Memory:  Immediate;   Fair Recent;   Poor Remote;   Poor  Judgement:  Impaired  Insight:  Lacking  Psychomotor Activity:  Restlessness  Concentration:  Concentration: Poor  Recall:  Poor  Fund of Knowledge:  Poor  Language:  Fair  Akathisia:  No  Handed:  Right  AIMS (if indicated):     Assets:  Housing  ADL's:  Impaired  Cognition:  Impaired,  Mild  Sleep:  Number of Hours: 9.5     Treatment Plan Summary: Medication management and Plan Patient with bipolar disorder continues with psychotic manic symptoms.  Orders are in place for appropriate medication but the patient has been noncompliant.  Tried to do some supportive counseling and encouraging him to take medication with minimal progress.  If he continues to refuse medicine may need orders for forced medicines soon but at this point has not yet been violent or disruptive and we will continue to try to reason with him.  Nothing in current vitals or labs to indicate need to change any specific medicines yet.  As needed medicines available as well.  Mordecai Rasmussen, MD 03/16/2020, 12:58 PM

## 2020-03-16 NOTE — [facility] Counselor (Signed)
CSW attempted to meet with patient for CCA but patient was unable to meet due to psychotic and paranoid behaviors. Patient is currently asleep.   This is patient's 1st attempt.   CSW will reattempt.

## 2020-03-16 NOTE — Progress Notes (Signed)
Verbal outburst X2, banging on nursing office door multiple times, demanding for gatorade, room with TV in it. Remains delusional, grandiose in nature on interactions. 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 You do not talk back or argue with me when I ask. I'm getting married next month. I will have 100 page boys in the wedding. I want you to go get your electric car for the wedding". Observed to be sexually inappropriate at times, asking male MHT "Tie me with your hair and let's see what we can do". Took his medications with increased prompts after refusing initially X3 attempts. Given PRN Zyprexa 10 mg PO at 1514 and was calmer at 1610 when reassessed.  Emotional support and encouragement provided to pt. Q 15 minutes safety checks maintained without self harm gestures. All medications given as ordered with verbal education and effects monitored.  Pt continue to need frequent verbal redirections. Denies concerns thus far. Safety maintained on unit.

## 2020-03-16 NOTE — Progress Notes (Signed)
  Emotional support and encouragement provided to pt. Q 15 minutes safety checks maintained without self harm gestures. All medications given as ordered with verbal education and effects monitored.  Pt receptive to care. Denies concerns thus far. Safety maintained on unit.

## 2020-03-17 DIAGNOSIS — F312 Bipolar disorder, current episode manic severe with psychotic features: Secondary | ICD-10-CM | POA: Diagnosis not present

## 2020-03-17 NOTE — [facility] Group Notes (Signed)
[facility] Group Notes: (Clinical Social Work)   03/17/2020      Type of Therapy:  Group Therapy   Participation Level:  Did Not Attend - was invited individually by Nurse/MHT and chose not to attend.   Susa Simmonds, LCSWA 03/17/2020  2:47 PM

## 2020-03-17 NOTE — [facility] Counselor (Signed)
CSW attempted to complete PSA. Patient is disorganized and not able to participate. 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 Patient stated that he was signing himself out of the hospital. Patient was sliding notes underneath the door to staff all morning and afternoon. Notes would have multiple one word phases.

## 2020-03-17 NOTE — Progress Notes (Signed)
Patient refused his Depakote. He says he cannot take it because a helicopter is coming to airlift him to one of his luxury homes. He did request to have his hs seroquel early because "I will be leaving soon". Patient is disheveled. Continues to talk about plans to expand his room into 2000 square feet and include a kitchen and a courtyard. He is willing to take other medications but adamantly refuses his Depakote.

## 2020-03-17 NOTE — Plan of Care (Signed)
  Problem: Safety: Goal: Violent Restraint(s) Outcome: Not Progressing   Problem: Education: Goal: Knowledge of [facility] Education information/materials will improve Outcome: Not Progressing Goal: Emotional status will improve Outcome: Not Progressing Goal: Mental status will improve Outcome: Not Progressing Goal: Verbalization of understanding the information provided will improve Outcome: Not Progressing   Problem: Activity: Goal: Interest or engagement in activities will improve Outcome: Not Progressing Goal: Sleeping patterns will improve Outcome: Not Progressing   Problem: Coping: Goal: Ability to verbalize frustrations and anger appropriately will improve Outcome: Not Progressing Goal: Ability to demonstrate self-control will improve Outcome: Not Progressing   Problem: Health Behavior/Discharge Planning: Goal: Identification of resources available to assist in meeting health care needs will improve Outcome: Not Progressing Goal: Compliance with treatment plan for underlying cause of condition will improve Outcome: Not Progressing   Problem: Physical Regulation: Goal: Ability to maintain clinical measurements within normal limits will improve Outcome: Not Progressing   Problem: Safety: Goal: Periods of time without injury will increase Outcome: Not Progressing   Problem: Activity: Goal: Will verbalize the importance of balancing activity with adequate rest periods Outcome: Not Progressing   Problem: Education: Goal: Will be free of psychotic symptoms Outcome: Not Progressing Goal: Knowledge of the prescribed therapeutic regimen will improve Outcome: Not Progressing   Problem: Coping: Goal: Coping ability will improve Outcome: Not Progressing Goal: Will verbalize feelings Outcome: Not Progressing   Problem: Health Behavior/Discharge Planning: Goal: Compliance with prescribed medication regimen will improve Outcome: Not Progressing   Problem:  Nutritional: Goal: Ability to achieve adequate nutritional intake will improve Outcome: Not Progressing   Problem: Role Relationship: Goal: Ability to communicate needs accurately will improve Outcome: Not Progressing Goal: Ability to interact with others will improve Outcome: Not Progressing   Problem: Safety: Goal: Ability to redirect hostility and anger into socially appropriate behaviors will improve Outcome: Not Progressing Goal: Ability to remain free from injury will improve Outcome: Not Progressing   Problem: Self-Care: Goal: Ability to participate in self-care as condition permits will improve Outcome: Not Progressing   Problem: Self-Concept: Goal: Will verbalize positive feelings about self Outcome: Not Progressing

## 2020-03-17 NOTE — Progress Notes (Signed)
Patient was at the door earlier becoming belligerent "I am leaving right now". He continued to refuse his Depakote when I offered it to him at hs, saying he had a reception to go to at his house in [place] and he did not want to take it because it would make him too sleepy to enjoy the reception. He was given Gatorade for complaints of leg cramping and instructed to do some stretches. He was compliant with his Seroquel and was able to be distracted from leaving by talking about the plans he has for expanding his room and adding a kitchen, a courtyard to resemble [place], and a trey ceiling. He instructed this author to select fabric for the awnings. He did lie down finally and has been resting

## 2020-03-17 NOTE — Plan of Care (Signed)
Patient's mental status is improving but continues to pace and to exhibit signs of grandiosity: thinks he is "a doctor, Personnel officer, Art gallery manager, a trillionaire".  He has flight of ideas. Restless, preoccupied and intrusive, frequently coming to the nurses station to talk about his accomplishments.This morning, patient refused his Depakote, reporting that he prefers to take it at bed time "because it makes me feel groggy". He is otherwise alert and orient and pleasant. His appetite is good and sleep improved. Frequently asking to be discharged and frequently redirected. He remains safe with no major sign of distress. Staff continue to provide support. Safety precautions maintained.

## 2020-03-17 NOTE — Progress Notes (Signed)
Patient refused to have vital signs taken. States "I'm protesting"

## 2020-03-17 NOTE — Progress Notes (Signed)
[facility]  03/17/2020 12:47 PM Austin Rhodes  MRN:  [id number] Subjective: Follow-up for this patient with bipolar mania.  Patient remains at least verbally agitated although he has not been physically aggressive or threatening and has not required one-on-one monitoring yet.  Today he was up in the morning sitting in the day room chatting with staff.  I talked with him a little bit and immediately he launched into his multiple delusional statements of almost ridiculous grandiosity.  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  At some point had a break in the conversation I change the subject a little and asked him if he would please take his Depakote.  At this point he became angry stood up over me he started shouting.  Started shouting at the ceiling as though there was a microphone listening to him that he wanted me to be arrested and dragged out of the room.  Did not actually give me the impression he was going to strike me but certainly put on a lot of posturing.  After that however he calmed down and has not had any behavior problems with anyone else.  Still not compliant with his proper oral medication. Principal Problem: Bipolar affective disorder, current episode manic with psychotic symptoms (HCC) Diagnosis: Principal Problem:   Bipolar affective disorder, current episode manic with psychotic symptoms (HCC) Active Problems:   HTN (hypertension)   GERD (gastroesophageal reflux disease)   History of CVA (cerebrovascular accident)   BPH (benign prostatic hyperplasia)   SVT (supraventricular tachycardia) (HCC)  Total Time spent with patient: 30 minutes  Past Psychiatric History: Long history of bipolar disorder med noncompliance poor functioning  Past Medical History:  Past Medical History:  Diagnosis Date  . Bipolar disorder (HCC)   . Coronary  artery disease   . GERD (gastroesophageal reflux disease)   . History of multiple strokes   . PTSD (post-traumatic stress disorder)     Past Surgical History:  Procedure Laterality Date  . CARDIAC CATHETERIZATION     Family History: History reviewed. No pertinent family history. Family Psychiatric  History: See previous Social History:  Social History   Substance and Sexual Activity  Alcohol Use No     Social History   Substance and Sexual Activity  Drug Use Never    Social History   Socioeconomic History  . Marital status: Legally Separated    Spouse name: Not on file  . Number of children: Not on file  . Years of education: Not on file  . Highest education level: Not on file  Occupational History  . Not on file  Tobacco Use  . Smoking status: Never Smoker  . Smokeless tobacco: Never Used  Vaping Use  . Vaping Use: Never used  Substance and Sexual Activity  . Alcohol use: No  . Drug use: Never  . Sexual activity: Not on file  Other Topics Concern  . Not on file  Social History Narrative  . Not on file   Social Determinants of Health   Financial Resource Strain: Not on file  Food Insecurity: Not on file  Transportation Needs: Not on file  Physical Activity: Not on file  Stress: Not on file  Social Connections: Not on file   Additional Social History:  Sleep: Negative  Appetite:  Negative  Current Medications: Current Facility-Administered Medications  Medication Dose Route Frequency Provider Last Rate Last Admin  . acetaminophen (TYLENOL) tablet 650 mg  650 mg Oral Q6H PRN Sanjiv Castorena T, MD      . alum & mag hydroxide-simeth (MAALOX/MYLANTA) 200-200-20 MG/5ML suspension 30 mL  30 mL Oral Q4H PRN Cullen Vanallen T, MD      . clopidogrel (PLAVIX) tablet 75 mg  75 mg Oral Daily Orella Cushman T, MD   75 mg at 03/17/20 0830  . divalproex (DEPAKOTE) DR tablet 750 mg  750 mg Oral Q12H Altonio Schwertner T, MD   750 mg at  03/16/20 1042  . magnesium hydroxide (MILK OF MAGNESIA) suspension 30 mL  30 mL Oral Daily PRN Rafiq Bucklin T, MD      . metoprolol succinate (TOPROL-XL) 24 hr tablet 50 mg  50 mg Oral Daily Delanda Bulluck, Jackquline Denmark, MD   50 mg at 03/17/20 0829  . OLANZapine (ZYPREXA) tablet 10 mg  10 mg Oral Q6H PRN Jesse Sans, MD   10 mg at 03/17/20 0830  . QUEtiapine (SEROQUEL) tablet 400 mg  400 mg Oral QHS Letonya Mangels T, MD   400 mg at 03/16/20 2118  . tamsulosin (FLOMAX) capsule 0.4 mg  0.4 mg Oral QPC supper Rissie Sculley, Jackquline Denmark, MD   0.4 mg at 03/16/20 1744  . ziprasidone (GEODON) injection 20 mg  20 mg Intramuscular Q6H PRN Jesse Sans, MD        Lab Results:  Results for orders placed or performed during the hospital encounter of 03/15/20 (from the past 48 hour(s))  Lipid panel     Status: None   Collection Time: 03/15/20  2:14 PM  Result Value Ref Range   Cholesterol 115 0 - 200 mg/dL   Triglycerides 010 <272 mg/dL   HDL 52 >53 mg/dL   Total CHOL/HDL Ratio 2.2 RATIO   VLDL 23 0 - 40 mg/dL   LDL Cholesterol 40 0 - 99 mg/dL    Comment:        Total Cholesterol/HDL:CHD Risk Coronary Heart Disease Risk Table                     Men   Women  1/2 Average Risk   3.4   3.3  Average Risk       5.0   4.4  2 X Average Risk   9.6   7.1  3 X Average Risk  23.4   11.0        Use the calculated Patient Ratio above and the CHD Risk Table to determine the patient's CHD Risk.        ATP III CLASSIFICATION (LDL):  <100     mg/dL   Optimal  664-403  mg/dL   Near or Above                    Optimal  130-159  mg/dL   Borderline  474-259  mg/dL   High  >563     mg/dL   Very High Performed at [facility], [address] [place], [place] [address]   Hemoglobin A1c     Status: None   Collection Time: 03/15/20  2:14 PM  Result Value Ref Range   Hgb A1c MFr Bld 5.5 4.8 - 5.6 %    Comment: (NOTE) Pre diabetes:          5.7%-6.4%  Diabetes:              >  6.4%  Glycemic control for    <7.0% adults with diabetes    Mean Plasma Glucose 111.15 mg/dL    Comment: Performed at [facility], [address] [place], [place] [address]    Blood Alcohol level:  Lab Results  Component Value Date   ETH <10 03/13/2020   ETH <10 12/16/2018    Metabolic Disorder Labs: Lab Results  Component Value Date   HGBA1C 5.5 03/15/2020   MPG 111.15 03/15/2020   MPG 114.02 03/13/2020   No results found for: PROLACTIN Lab Results  Component Value Date   CHOL 115 03/15/2020   TRIG 117 03/15/2020   HDL 52 03/15/2020   CHOLHDL 2.2 03/15/2020   VLDL 23 03/15/2020   LDLCALC 40 03/15/2020   LDLCALC 53 03/13/2020    Physical Findings: AIMS:  , ,  ,  ,    CIWA:    COWS:     Musculoskeletal: Strength & Muscle Tone: within normal limits Gait & Station: normal Patient leans: N/A  Psychiatric Specialty Exam: Physical Exam Vitals and nursing note reviewed.  Constitutional:      Appearance: He is well-developed and well-nourished.  HENT:     Head: Normocephalic and atraumatic.  Eyes:     Conjunctiva/sclera: Conjunctivae normal.     Pupils: Pupils are equal, round, and reactive to light.  Cardiovascular:     Heart sounds: Normal heart sounds.  Pulmonary:     Effort: Pulmonary effort is normal.  Abdominal:     Palpations: Abdomen is soft.  Musculoskeletal:        General: Normal range of motion.     Cervical back: Normal range of motion.  Skin:    General: Skin is warm and dry.  Neurological:     General: No focal deficit present.     Mental Status: He is alert.  Psychiatric:        Attention and Perception: He is inattentive.        Mood and Affect: Affect is labile, angry and inappropriate.        Speech: Speech is rapid and pressured and tangential.        Behavior: Behavior is agitated and aggressive.        Thought Content: Thought content is delusional. Thought content does not include homicidal or suicidal ideation.        Cognition and Memory: Cognition  is impaired.        Judgment: Judgment is inappropriate.     Review of Systems  Constitutional: Negative.   HENT: Negative.   Eyes: Negative.   Respiratory: Negative.   Cardiovascular: Negative.   Gastrointestinal: Negative.   Musculoskeletal: Negative.   Skin: Negative.   Neurological: Negative.   Psychiatric/Behavioral: Positive for confusion. The patient is nervous/anxious.     Blood pressure (!) 123/97, pulse (!) 102, temperature 98.9 F (37.2 C), temperature source Oral, resp. rate 18, height 5\' 10"  (1.778 m), weight 71.2 kg, SpO2 99 %.Body mass index is 22.53 kg/m.  General Appearance: Disheveled  Eye Contact:  Fair  Speech:  Pressured  Volume:  Increased  Mood:  Angry and Irritable  Affect:  Inappropriate  Thought Process:  Disorganized  Orientation:  Full (Time, Place, and Person)  Thought Content:  Illogical, Delusions, Paranoid Ideation and Tangential  Suicidal Thoughts:  No  Homicidal Thoughts:  No  Memory:  Immediate;   Fair Recent;   Poor Remote;   Poor  Judgement:  Poor  Insight:  Shallow  Psychomotor Activity:  Increased and Restlessness  Concentration:  Concentration: Poor  Recall:  Poor  Fund of Knowledge:  Poor  Language:  Poor  Akathisia:  Negative  Handed:  Right  AIMS (if indicated):     Assets:  Desire for Improvement Housing Resilience  ADL's:  Impaired  Cognition:  Impaired,  Mild  Sleep:  Number of Hours: 7     Treatment Plan Summary: Medication management and Plan Still not med compliant and not willing to be engaged in it although he will at times take some of the medicine nursing offers him.  Tomorrow when Dr. Neale Burly is back there will be 2 of [place] here and we can decide if he needs a forced medication protocol but it does not appear to be necessary to try and get that done today.  Continue orders currently.  His blood pressure is a little on the high side diastolic but he is so labile in his behavior that I would not change the medicine  yet.  Mordecai Rasmussen, MD 03/17/2020, 12:47 PM

## 2020-03-18 DIAGNOSIS — F312 Bipolar disorder, current episode manic severe with psychotic features: Secondary | ICD-10-CM | POA: Diagnosis not present

## 2020-03-18 MED ORDER — OLANZAPINE 10 MG IM SOLR
10.0000 mg | Freq: Two times a day (BID) | INTRAMUSCULAR | Status: DC
  Administered 2020-03-19: 10 mg via INTRAMUSCULAR
  Filled 2020-03-18 (×4): qty 10

## 2020-03-18 MED ORDER — ENSURE ENLIVE PO LIQD
237.0000 mL | Freq: Three times a day (TID) | ORAL | Status: DC
  Administered 2020-03-18 – 2020-03-26 (×20): 237 mL via ORAL

## 2020-03-18 MED ORDER — ADULT MULTIVITAMIN W/MINERALS CH
1.0000 | ORAL_TABLET | Freq: Every day | ORAL | Status: DC
  Administered 2020-03-19 – 2020-03-27 (×9): 1 via ORAL
  Filled 2020-03-18 (×9): qty 1

## 2020-03-18 MED ORDER — DIVALPROEX SODIUM 500 MG PO DR TAB
750.0000 mg | DELAYED_RELEASE_TABLET | Freq: Two times a day (BID) | ORAL | Status: DC
  Administered 2020-03-19 – 2020-03-27 (×16): 750 mg via ORAL
  Filled 2020-03-18 (×16): qty 1

## 2020-03-18 NOTE — Progress Notes (Signed)
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 Patient agitated and banging on the nurses station door,calling staff names and demanding for discharge. Patient is not receptive with staff. Support and encouragement given.

## 2020-03-18 NOTE — B[facility] Counselor (Signed)
Adult Comprehensive Assessment  Patient ID: Austin Rhodes, male   DOB: 1950-02-09, 70 y.o.   MRN: [id number]  Current Stressors:  Patient states their primary concerns and needs for treatment are:: Unable to assess at this time.   Patient states their goals for this hospitalization and ongoing recovery are:: Unable to assess at this time.       Living/Environment/Situation:  Living Arrangements: Alone Living conditions (as described by patient or guardian): Notes indicate that the patient has been evicted, however patient speaks of returning to his home.   Family History:  Marital status: Single Does patient have children?: Yes How many children?: 4 How is patient's relationship with their children?: says he has 3 boys and a girl and that he has good relationships with them.     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 that he spends a lot of time with.   Leisure/Recreation:   Leisure and Hobbies: fishing, reading, being outdoors.  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   Discharge Plan:   Currently receiving community mental health services: Yes (From Whom)([facility], but does not want to return there) Patient states concerns and preferences for aftercare planning are: getting bubble pack medications straightened out. Does patient have access to transportation?: Yes Does patient have financial barriers related to discharge medications?: Yes Will patient be returning to same living situation after discharge?: Yes  Summary/Recommendations:   Summary and Recommendations (to be completed by the evaluator): Patient is a 70 year old male from [place], [place] [facility]).  He presents to the hospital under IVC for delusions of grandeur.  Patient remains delusional and disorganized while on the unit.  Assessment was completed utilizing chart review due to patients' acuity and increase in aggressive behaviors.  Patient reports that he can return home, however, notes indicate that patient can not at this time.  Contact attempts with collaterals have not been successful at this time.  Recommendations include: crisis stabilization, therapeutic milieu, encourage group attendance and participation, medication management for mood stabilization and development of comprehensive mental wellness plan.  Harden Mo. 03/18/2020

## 2020-03-18 NOTE — Plan of Care (Signed)
Patient took Zyprexa with much encouragement.Patient seems less aggressive at this time.Patient states " I don't normally curse people." Patient continues to be grandiose and delusional. Patient appreciated for Ensure. Patient is interacting and laughing with peers. Patient stated that his appetite is not very good. Support and encouragement given.

## 2020-03-18 NOTE — Progress Notes (Signed)
[facility] MD Progress Note  03/18/2020 2:00 PM Austin Rhodes  MRN:  [id number]   CC: "I'm being discharged."   Subjective:  70 year old male with bipolar disorder presenting under IVC petition for manic episode with psychosis. Patient seen during treatment team and again one-no-one multiple times this morning. He remains grandiose, labile, with flight of ideas and pressured speech. He is also verbally aggressive with staff today, and continues to try and pound on the doors. He continues to feel he has bought out the hospital, and is now the [facility] and physician in charge. He demands to be discharged, and to speak with everyone in his conference room. He is unable to be redirected, and curses at provider and postures aggressively. He continues to refuse most of his medications, though took Depakote this morning with encouragement. Patient has a severe mental illness that is unlikely to improve without medications. He is currently endangering himself and staff and peers on the unit. For these reasons, I feel he would benefit from non-emergent forced medications. Will consult Dr. Toni Amend for second opinion.   Principal Problem: Bipolar affective disorder, current episode manic with psychotic symptoms (HCC) Diagnosis: Principal Problem:   Bipolar affective disorder, current episode manic with psychotic symptoms (HCC) Active Problems:   HTN (hypertension)   GERD (gastroesophageal reflux disease)   History of CVA (cerebrovascular accident)   BPH (benign prostatic hyperplasia)   SVT (supraventricular tachycardia) (HCC)  Total Time spent with patient: 45 minutes  Past Psychiatric History: See H&P. Patient has tried carbamazepine, lamotrigine, and depakote in past for mood stabilization. Has been tried on Haldol which worsened symptoms, and has been on Zyprexa and Seroquel with reasonably good effect.   Past Medical History:  Past Medical History:  Diagnosis Date  . Bipolar disorder (HCC)   .  Coronary artery disease   . GERD (gastroesophageal reflux disease)   . History of multiple strokes   . PTSD (post-traumatic stress disorder)     Past Surgical History:  Procedure Laterality Date  . CARDIAC CATHETERIZATION     Family History: History reviewed. No pertinent family history. Family Psychiatric  History: Denied Social History:  Social History   Substance and Sexual Activity  Alcohol Use No     Social History   Substance and Sexual Activity  Drug Use Never    Social History   Socioeconomic History  . Marital status: Divorced    Spouse name: Not on file  . Number of children: Not on file  . Years of education: Not on file  . Highest education level: Not on file  Occupational History  . Not on file  Tobacco Use  . Smoking status: Never Smoker  . Smokeless tobacco: Never Used  Vaping Use  . Vaping Use: Never used  Substance and Sexual Activity  . Alcohol use: No  . Drug use: Never  . Sexual activity: Not on file  Other Topics Concern  . Not on file  Social History Narrative  . Not on file   Social Determinants of Health   Financial Resource Strain: Not on file  Food Insecurity: Not on file  Transportation Needs: Not on file  Physical Activity: Not on file  Stress: Not on file  Social Connections: Not on file   Additional Social History:                         Sleep: Fair  Appetite:  Fair  Current Medications: Current Facility-Administered Medications  Medication Dose Route Frequency Provider Last Rate Last Admin  . acetaminophen (TYLENOL) tablet 650 mg  650 mg Oral Q6H PRN Clapacs, Jackquline Denmark, MD   650 mg at 03/17/20 2108  . alum & mag hydroxide-simeth (MAALOX/MYLANTA) 200-200-20 MG/5ML suspension 30 mL  30 mL Oral Q4H PRN Clapacs, John T, MD      . clopidogrel (PLAVIX) tablet 75 mg  75 mg Oral Daily Clapacs, Jackquline Denmark, MD   75 mg at 03/18/20 0824  . divalproex (DEPAKOTE) DR tablet 750 mg  750 mg Oral Q12H Clapacs, Jackquline Denmark, MD   250 mg at  03/18/20 0825  . feeding supplement (ENSURE ENLIVE / ENSURE PLUS) liquid 237 mL  237 mL Oral TID BM Jesse Sans, MD      . magnesium hydroxide (MILK OF MAGNESIA) suspension 30 mL  30 mL Oral Daily PRN Clapacs, John T, MD      . metoprolol succinate (TOPROL-XL) 24 hr tablet 50 mg  50 mg Oral Daily Clapacs, Jackquline Denmark, MD   50 mg at 03/18/20 0817  . [START ON 03/19/2020] multivitamin with minerals tablet 1 tablet  1 tablet Oral Daily Jesse Sans, MD      . OLANZapine [facility]) tablet 10 mg  10 mg Oral Q6H PRN Jesse Sans, MD   10 mg at 03/17/20 1709  . QUEtiapine (SEROQUEL) tablet 400 mg  400 mg Oral QHS Clapacs, Jackquline Denmark, MD   400 mg at 03/17/20 2003  . tamsulosin (FLOMAX) capsule 0.4 mg  0.4 mg Oral QPC supper Clapacs, John T, MD   0.4 mg at 03/17/20 1709  . ziprasidone (GEODON) injection 20 mg  20 mg Intramuscular Q6H PRN Jesse Sans, MD        Lab Results: No results found for this or any previous visit (from the past 48 hour(s)).  Blood Alcohol level:  Lab Results  Component Value Date   ETH <10 03/13/2020   ETH <10 12/16/2018    Metabolic Disorder Labs: Lab Results  Component Value Date   HGBA1C 5.5 03/15/2020   MPG 111.15 03/15/2020   MPG 114.02 03/13/2020   No results found for: PROLACTIN Lab Results  Component Value Date   CHOL 115 03/15/2020   TRIG 117 03/15/2020   HDL 52 03/15/2020   CHOLHDL 2.2 03/15/2020   VLDL 23 03/15/2020   LDLCALC 40 03/15/2020   LDLCALC 53 03/13/2020    Physical Findings: AIMS:  , ,  ,  ,    CIWA:    COWS:     Musculoskeletal: Strength & Muscle Tone: within normal limits Gait & Station: normal Patient leans: N/A  Psychiatric Specialty Exam: Physical Exam  Review of Systems  Blood pressure (!) 141/81, pulse (!) 107, temperature 98.9 F (37.2 C), temperature source Oral, resp. rate 18, height 5\' 10"  (1.778 m), weight 71.2 kg, SpO2 99 %.Body mass index is 22.53 kg/m.  General Appearance: Fairly Groomed  Eye Contact:   Intense, glaring  Speech:  Pressured  Volume:  Increased  Mood:  Irritable  Affect:  Congruent and Labile  Thought Process:  Disorganized  Orientation:  Full (Time, Place, and Person)  Thought Content:  Illogical, Delusions, Rumination and Tangential  Suicidal Thoughts:  No  Homicidal Thoughts:  No  Memory:  Immediate;   Fair Recent;   Poor Remote;   Poor  Judgement:  Impaired  Insight:  Lacking  Psychomotor Activity:  Restlessness  Concentration:  Concentration: Poor  Recall:  Poor  Fund  of Knowledge:  Poor  Language:  Fair  Akathisia:  Negative  Handed:  Right  AIMS (if indicated):     Assets:  Desire for Improvement Housing Resilience  ADL's:  Impaired  Cognition:  Impaired,  Mild  Sleep:  Number of Hours: 7     Treatment Plan Summary: Daily contact with patient to assess and evaluate symptoms and progress in treatment and Medication management  1) Bipolar Affective Disorder, current episode manic with psychotic symptoms- established problem, unstable - Patient grandiose, hyperverbal, tangential, and irritable on exam. He has been verbally aggressive with multiple staff, and beating on doors trying to get into office and nurses station. Patient has a severe mental illness that is unlikely to improve without medications. He is currently endangering himself and staff and peers on the unit. For these reasons, I feel he would benefit from non-emergent forced medications. - Depakote 750 mg BID- patient has only taken 1 time since admission, and typically refuses. Thus, will hold off on checking level - Requesting second opinion for non-emergent forced medication order  - Seroquel 400 mg QHS   2-4) History of CVA, SVT, HTN- established problems, all stable - Plavix 75 mg daily, metoprolol XL 50 mg daily  5) BPH- established problem, stable - Flomax 0.4 mg daily  03/18/20 Psychiatric exam above reviewed and remains accurate. Assessment and plan above reviewed and updated.    Jesse Sans, MD 03/18/2020, 2:00 PM

## 2020-03-18 NOTE — Progress Notes (Signed)
Patient refused to come out of day room for cleaning. Patient verbally aggressive with staff and calling names. Patient banging at nurses station door multiple times and verbally aggressive with staff. Patient is redirected after multiple attempts.

## 2020-03-18 NOTE — B[facility] Group Notes (Signed)
LCSW Group Therapy Note     03/18/2020 2:32 PM     Type of Therapy and Topic:  Group Therapy:  Overcoming Obstacles     Participation Level:  Did Not Attend     Description of Group:     In this group patients will be encouraged to explore what they see as obstacles to their own wellness and recovery. They will be guided to discuss their thoughts, feelings, and behaviors related to these obstacles. The group will process together ways to cope with barriers, with attention given to specific choices patients can make. Each patient will be challenged to identify changes they are motivated to make in order to overcome their obstacles. This group will be process-oriented, with patients participating in exploration of their own experiences as well as giving and receiving support and challenge from other group members.     Therapeutic Goals:  1.    Patient will identify personal and current obstacles as they relate to admission.  2.    Patient will identify barriers that currently interfere with their wellness or overcoming obstacles.  3.    Patient will identify feelings, thought process and behaviors related to these barriers.  4.    Patient will identify two changes they are willing to make to overcome these obstacles:    Summary of Patient Progress: X   Therapeutic Modalities:    Cognitive Behavioral Therapy  Solution Focused Therapy  Motivational Interviewing  Relapse Prevention Therapy     Eros Montour [place], MSW, LCSW-A  03/18/2020 2:32 PM

## 2020-03-18 NOTE — Progress Notes (Signed)
Initial Nutrition Assessment  DOCUMENTATION CODES:   Not applicable  INTERVENTION:   Ensure Enlive po TID, each supplement provides 350 kcal and 20 grams of protein  MVI po daily   Liberalize diet   NUTRITION DIAGNOSIS:   Predicted suboptimal nutrient intake related to social / environmental circumstances as evidenced by other (comment) (per chart review).  GOAL:   Patient will meet greater than or equal to 90% of their needs  MONITOR:   PO intake,Supplement acceptance  REASON FOR ASSESSMENT:   Malnutrition Screening Tool    ASSESSMENT:   70 y/o male with h/o bipolar disorder, HTN, GERD, CVA, BPH and SVT who is admitted with hallucinations.  Suspect pt with decreased appetite and oral intake pta r/t bipolar disorder. There are no documented intakes in chart to determine if patient is eating well in hospital. RD will add supplements and MVI to help pt meet his estimated needs. RD will also liberalize pt's diet as pt does not have a history of DM. Per chart, pt appears to be down 12lbs(7%) from his UBW; however, there are no recent documented weights in chart to determine if any significant weight changes.   Medications reviewed and include: plavix  Labs reviewed:   Diet Order:   Diet Order            Diet regular Room service appropriate? Yes; Fluid consistency: Thin  Diet effective now                EDUCATION NEEDS:   No education needs have been identified at this time  Skin:  Skin Assessment: Reviewed RN Assessment  Last BM:  2/11  Height:   Ht Readings from Last 1 Encounters:  03/15/20 5\' 10"  (1.778 m)    Weight:   Wt Readings from Last 1 Encounters:  03/15/20 71.2 kg    Ideal Body Weight:  75.45 kg  BMI:  Body mass index is 22.53 kg/m.  Estimated Nutritional Needs:   Kcal:  1900-2200kcal/day  Protein:  95-110g/day  Fluid:  1.9-2.2L/day  05/13/20 MS, RD, LDN Please refer to [facility] for RD and/or RD on-call/weekend/after hours  pager

## 2020-03-18 NOTE — Tx Team (Signed)
Interdisciplinary Treatment and Diagnostic Plan Update  03/18/2020 Time of Session: 09:00 Austin Rhodes MRN: [id number]  Principal Diagnosis: Bipolar affective disorder, current episode manic with psychotic symptoms ([place])  Secondary Diagnoses: Principal Problem:   Bipolar affective disorder, current episode manic with psychotic symptoms ([place]) Active Problems:   HTN (hypertension)   GERD (gastroesophageal reflux disease)   History of CVA (cerebrovascular accident)   BPH (benign prostatic hyperplasia)   SVT (supraventricular tachycardia) ([place])   Current Medications:  Current Facility-Administered Medications  Medication Dose Route Frequency Provider Last Rate Last Admin  . acetaminophen (TYLENOL) tablet 650 mg  650 mg Oral Q6H PRN Clapacs, Madie Reno, MD   650 mg at 03/17/20 2108  . alum & mag hydroxide-simeth (MAALOX/MYLANTA) 200-200-20 MG/5ML suspension 30 mL  30 mL Oral Q4H PRN Clapacs, John T, MD      . clopidogrel (PLAVIX) tablet 75 mg  75 mg Oral Daily Clapacs, Madie Reno, MD   75 mg at 03/18/20 0824  . divalproex (DEPAKOTE) DR tablet 750 mg  750 mg Oral Q12H Clapacs, Madie Reno, MD   250 mg at 03/18/20 0825  . magnesium hydroxide (MILK OF MAGNESIA) suspension 30 mL  30 mL Oral Daily PRN Clapacs, John T, MD      . metoprolol succinate (TOPROL-XL) 24 hr tablet 50 mg  50 mg Oral Daily Clapacs, Madie Reno, MD   50 mg at 03/18/20 0817  . OLANZapine (ZYPREXA) tablet 10 mg  10 mg Oral Q6H PRN Salley Scarlet, MD   10 mg at 03/17/20 1709  . QUEtiapine (SEROQUEL) tablet 400 mg  400 mg Oral QHS Clapacs, Madie Reno, MD   400 mg at 03/17/20 2003  . tamsulosin (FLOMAX) capsule 0.4 mg  0.4 mg Oral QPC supper Clapacs, John T, MD   0.4 mg at 03/17/20 1709  . ziprasidone (GEODON) injection 20 mg  20 mg Intramuscular Q6H PRN Salley Scarlet, MD       PTA Medications: Medications Prior to Admission  Medication Sig Dispense Refill Last Dose  . acetaminophen (TYLENOL) 325 MG tablet Take 2 tablets (650 mg total)  by mouth every 6 (six) hours as needed for mild pain or moderate pain. 30 tablet 0   . clopidogrel (PLAVIX) 75 MG tablet Take 1 tablet (75 mg total) by mouth daily. 30 tablet 1   . divalproex (DEPAKOTE ER) 500 MG 24 hr tablet 500 mg at bedtime.     . divalproex (DEPAKOTE) 500 MG DR tablet Take 1 tablet (500 mg total) by mouth every morning. 30 tablet 1   . divalproex (DEPAKOTE) 500 MG DR tablet Take 2 tablets (1,000 mg total) by mouth every evening. 60 tablet 1   . hydrOXYzine (ATARAX/VISTARIL) 50 MG tablet Take 1 tablet (50 mg total) by mouth 3 (three) times daily as needed for anxiety. 30 tablet 1   . lamoTRIgine (LAMICTAL) 100 MG tablet Take 1 tablet (100 mg total) by mouth daily. 30 tablet 1   . metoprolol succinate (TOPROL-XL) 25 MG 24 hr tablet Take 25 mg by mouth daily.     . metoprolol succinate (TOPROL-XL) 50 MG 24 hr tablet Take 1 tablet (50 mg total) by mouth daily. 30 tablet 1   . QUEtiapine (SEROQUEL XR) 200 MG 24 hr tablet Take 200 mg by mouth at bedtime.     Marland Kitchen QUEtiapine (SEROQUEL) 200 MG tablet Take 5 tablets (1,000 mg total) by mouth at bedtime. 150 tablet 1   . rosuvastatin (CRESTOR) 10 MG tablet  Take 1 tablet (10 mg total) by mouth daily. 30 tablet 1   . tamsulosin (FLOMAX) 0.4 MG CAPS capsule Take 1 capsule (0.4 mg total) by mouth daily after supper. 30 capsule 1     Patient Stressors: Financial difficulties Health problems  Patient Strengths: Average or above average intelligence Communication skills General fund of knowledge Supportive family/friends  Treatment Modalities: Medication Management, Group therapy, Case management,  1 to 1 session with clinician, Psychoeducation, Recreational therapy.   Physician Treatment Plan for Primary Diagnosis: Bipolar affective disorder, current episode manic with psychotic symptoms ([place]) Long Term Goal(s): Improvement in symptoms so as ready for discharge Improvement in symptoms so as ready for discharge   Short Term Goals:  Ability to identify changes in lifestyle to reduce recurrence of condition will improve Ability to verbalize feelings will improve Ability to disclose and discuss suicidal ideas Ability to demonstrate self-control will improve Ability to identify and develop effective coping behaviors will improve Ability to maintain clinical measurements within normal limits will improve Compliance with prescribed medications will improve Ability to identify changes in lifestyle to reduce recurrence of condition will improve Ability to verbalize feelings will improve Ability to disclose and discuss suicidal ideas Ability to demonstrate self-control will improve Ability to identify and develop effective coping behaviors will improve Ability to maintain clinical measurements within normal limits will improve Compliance with prescribed medications will improve  Medication Management: Evaluate patient's response, side effects, and tolerance of medication regimen.  Therapeutic Interventions: 1 to 1 sessions, Unit Group sessions and Medication administration.  Evaluation of Outcomes: Not Met  Physician Treatment Plan for Secondary Diagnosis: Principal Problem:   Bipolar affective disorder, current episode manic with psychotic symptoms ([place]) Active Problems:   HTN (hypertension)   GERD (gastroesophageal reflux disease)   History of CVA (cerebrovascular accident)   BPH (benign prostatic hyperplasia)   SVT (supraventricular tachycardia) ([place])  Long Term Goal(s): Improvement in symptoms so as ready for discharge Improvement in symptoms so as ready for discharge   Short Term Goals: Ability to identify changes in lifestyle to reduce recurrence of condition will improve Ability to verbalize feelings will improve Ability to disclose and discuss suicidal ideas Ability to demonstrate self-control will improve Ability to identify and develop effective coping behaviors will improve Ability to maintain clinical  measurements within normal limits will improve Compliance with prescribed medications will improve Ability to identify changes in lifestyle to reduce recurrence of condition will improve Ability to verbalize feelings will improve Ability to disclose and discuss suicidal ideas Ability to demonstrate self-control will improve Ability to identify and develop effective coping behaviors will improve Ability to maintain clinical measurements within normal limits will improve Compliance with prescribed medications will improve     Medication Management: Evaluate patient's response, side effects, and tolerance of medication regimen.  Therapeutic Interventions: 1 to 1 sessions, Unit Group sessions and Medication administration.  Evaluation of Outcomes: Not Met   RN Treatment Plan for Primary Diagnosis: Bipolar affective disorder, current episode manic with psychotic symptoms ([place]) Long Term Goal(s): Knowledge of disease and therapeutic regimen to maintain health will improve  Short Term Goals: Ability to verbalize frustration and anger appropriately will improve, Ability to demonstrate self-control, Ability to participate in decision making will improve, Ability to verbalize feelings will improve, Ability to identify and develop effective coping behaviors will improve and Compliance with prescribed medications will improve  Medication Management: RN will administer medications as ordered by provider, will assess and evaluate patient's response and provide education  to patient for prescribed medication. RN will report any adverse and/or side effects to prescribing provider.  Therapeutic Interventions: 1 on 1 counseling sessions, Psychoeducation, Medication administration, Evaluate responses to treatment, Monitor vital signs and CBGs as ordered, Perform/monitor CIWA, COWS, AIMS and Fall Risk screenings as ordered, Perform wound care treatments as ordered.  Evaluation of Outcomes: Not Met   LCSW  Treatment Plan for Primary Diagnosis: Bipolar affective disorder, current episode manic with psychotic symptoms ([place]) Long Term Goal(s): Safe transition to appropriate next level of care at discharge, Engage patient in therapeutic group addressing interpersonal concerns.  Short Term Goals: Engage patient in aftercare planning with referrals and resources, Increase social support, Increase ability to appropriately verbalize feelings, Increase emotional regulation, Facilitate acceptance of mental health diagnosis and concerns, Identify triggers associated with mental health/substance abuse issues and Increase skills for wellness and recovery  Therapeutic Interventions: Assess for all discharge needs, 1 to 1 time with Social worker, Explore available resources and support systems, Assess for adequacy in community support network, Educate family and significant other(s) on suicide prevention, Complete Psychosocial Assessment, Interpersonal group therapy.  Evaluation of Outcomes: Not Met   Progress in Treatment: Attending groups: No. Participating in groups: No. Taking medication as prescribed: Yes. and No. Toleration medication: Yes. Family/Significant other contact made: No, will contact:  when given permission. Patient understands diagnosis: No. Discussing patient identified problems/goals with staff: No. Medical problems stabilized or resolved: Yes. Denies suicidal/homicidal ideation: Yes. Issues/concerns per patient self-inventory: No. Other: None.  New problem(s) identified: No, Describe:  none.  New Short Term/Long Term Goal(s): detox, elimination of symptoms of psychosis, medication management for mood stabilization; development of comprehensive mental wellness/sobriety plan.  Patient Goals: "Go get my discharge papers."   Discharge Plan or Barriers: CSW will assist pt with development of discharge/aftercare plan.  Reason for Continuation of Hospitalization: Aggression Delusions   Medication stabilization  Estimated Length of Stay: 1-7 days  Attendees: Patient: Austin Rhodes 03/18/2020 9:58 AM  Physician: Selina Cooley, MD 03/18/2020 9:58 AM  Nursing: West Pugh, RN 03/18/2020 9:58 AM  RN Care Manager: 03/18/2020 9:58 AM  Social Worker: Chalmers Guest. Guerry Bruin, MSW, [place], [place] 03/18/2020 9:58 AM  Recreational Therapist: Devin Going, LRT  03/18/2020 9:58 AM  Other: [name], MSW, LCSW 03/18/2020 9:58 AM  Other: Kiva Martinique, MSW, LCSW-A 03/18/2020 9:58 AM  Other: 03/18/2020 9:58 AM    Scribe for Treatment Team: Shirl Harris, LCSW 03/18/2020 9:58 AM

## 2020-03-18 NOTE — Progress Notes (Signed)
[facility] Second Physician Opinion Progress Note for Medication Administration to Non-consenting Patients (For Involuntarily Committed Patients)  Patient: Austin Rhodes Date of Birth: [id number] MRN: [id number]  Reason for the Medication: The patient, without the benefit of the specific treatment measure, is incapable of participating in any available treatment plan that will give the patient a realistic opportunity of improving the patient's condition. There is, without the benefit of the specific treatment measure, a significant possibility that the patient will harm self or others before improvement of the patient's condition is realized.  Consideration of Side Effects: Consideration of the side effects related to the medication plan has been given.  Rationale for Medication Administration: Patient with bipolar disorder continues to exhibit constant psychosis as part of his mania.  Bizarre grandiose ideas.  Complete lack of insight.  Frequent agitation with labile mood that can escalate to becoming threatening with peers and staff.  This condition and particularly in this patient's history is unlikely to improve without aggressive medication which he is refusing entirely because of his lack of insight and psychosis.  Risks are greatly outweighed by the potential benefits of forcing medication.    Mordecai Rasmussen, MD 03/18/20  2:43 PM   This documentation is good for (7) seven days from the date of the MD signature. New documentation must be completed every seven (7) days with detailed justification in the medical record if the patient requires continued non-emergent administration of psychotropic medications.

## 2020-03-18 NOTE — B[facility] Suicide Risk Assessment (Signed)
[facility] INPATIENT:  Family/Significant Other Suicide Prevention Education  Suicide Prevention Education:  Contact Attempts: Haedyn Ancrum, daughter, [phone]  has been identified by the patient as the family member/significant other with whom the patient will be residing, and identified as the person(s) who will aid the patient in the event of a mental health crisis.  With written consent from the patient, two attempts were made to provide suicide prevention education, prior to and/or following the patient's discharge.  We were unsuccessful in providing suicide prevention education.  A suicide education pamphlet was given to the patient to share with family/significant other.  Date and time of first attempt: 03/18/2020 at 10:00AM Date and time of second attempt:Second attempt is needed.  CSW left HIPAA compliant voicemail.  Harden Mo 03/18/2020, 12:37 PM

## 2020-03-19 MED ORDER — SIMETHICONE 80 MG PO CHEW
80.0000 mg | CHEWABLE_TABLET | Freq: Four times a day (QID) | ORAL | Status: DC | PRN
  Administered 2020-03-20: 80 mg via ORAL
  Filled 2020-03-19: qty 1

## 2020-03-19 NOTE — Plan of Care (Signed)
Patient presents irritable and demanding   Problem: Education: Goal: Emotional status will improve Outcome: Not Progressing Goal: Mental status will improve Outcome: Not Progressing

## 2020-03-19 NOTE — Progress Notes (Signed)
Pt woke and took morning meds in the afternoon. Pt has been on a 1:1 with staff for safety while awake per orders. Pt is delusional, grandiose, hyperactive, easily irritable, paces at times, thoughts are disorganized, tangential, sexually inappropriate at times telling the MHT and the RN that he'd like to sleep with them on a "3-way." Pt spends time watching tv with MHT in the back hallway. Pt reports neck pain and was given Tylenol PRN for that pain. Pt's appetite is good, ate dinner. Pt was provided with PO fluids, water, tea, Gatorade. Pt has poor insight and judgement. Pt denies suicidal and homicidal ideation, denies hallucinations, (but was noted to be talking to himself in his room, responding to internal stimuli), denies feelings of depression and anxiety. Pt was cooperative with vital signs. Pt's appearance is disheveled. Will continue to monitor pt per Q15 minute face checks and monitor for safety and progress.

## 2020-03-19 NOTE — Progress Notes (Signed)
Pt continues to sleep, respirations continue to be WNL, and unit Psychiatrist MD Neale Burly is aware that pt did not yet wake and take his morning medications, and agrees that pt may continue to sleep for duration of the morning, and not to wake pt at this time for AM meds, that his need for sleep after his medications from HS is a priority, plan to given AM meds when pt wakes. Will continue to monitor pt per Q15 minute face checks and monitor for safety and progress. Pt will be a 1:1 when awake per MD orders due to recent history of aggression.

## 2020-03-19 NOTE — Progress Notes (Signed)
Per shift report pt was sedated due to IM medications given on HS shift prior to arrival of this writer to current shift. Received pt sedated, respirations noted to be WNL, in bed, unable to give 0800 medications, will reassess for appropriateness and ability to take AM meds. Will continue to monitor pt per Q15 minute face checks and monitor for safety and progress.

## 2020-03-19 NOTE — Progress Notes (Signed)
Patient irritable and banging on the doors on the locked unit. Patient yelling stating he was getting out of here, "right fucking now". Patient given education, patient remained irritable and cursing at staff. Patient went back to the dayroom on the locked hall with his sitter present. When staff along with security went back to give PRN medications, (See [facility]) patient became irate with staff and threw a bottle of gatorade at the feet of this nurse. Staff and security had to hold patient to give him his PRN medications. Patient remains on the dayroom on the locked hall with his 1:1 present and remains safe on the unit.

## 2020-03-19 NOTE — B[facility] Group Notes (Signed)
LCSW Group Therapy Note  03/19/2020 2:21 PM  Type of Therapy/Topic:  Group Therapy:  Feelings about Diagnosis  Participation Level:  Did Not Attend   Description of Group:   This group will allow patients to explore their thoughts and feelings about diagnoses they have received. Patients will be guided to explore their level of understanding and acceptance of these diagnoses. Facilitator will encourage patients to process their thoughts and feelings about the reactions of others to their diagnosis and will guide patients in identifying ways to discuss their diagnosis with significant others in their lives. This group will be process-oriented, with patients participating in exploration of their own experiences, giving and receiving support, and processing challenge from other group members.   Therapeutic Goals: 1. Patient will demonstrate understanding of diagnosis as evidenced by identifying two or more symptoms of the disorder 2. Patient will be able to express two feelings regarding the diagnosis 3. Patient will demonstrate their ability to communicate their needs through discussion and/or role play  Summary of Patient Progress: Austin Rhodes  Therapeutic Modalities:   Cognitive Behavioral Therapy Brief Therapy Feelings Identification   Austin Rhodes R. Austin Rhodes, MSW, LCSW, LCAS 03/19/2020 2:21 PM

## 2020-03-19 NOTE — BHH Counselor (Signed)
CSW spoke with patient on staying at a group home.  Patient was disorganized and did not answer the question.  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  I have all the help I need."  CSW will continue to assess.  Penni Homans, MSW, LCSW 03/19/2020 4:18 PM

## 2020-03-19 NOTE — B[facility] Suicide Risk Assessment (Signed)
[facility] INPATIENT:  Family/Significant Other Suicide Prevention Education  Suicide Prevention Education:  Education Completed; Khalifa Knecht, daughter, [phone] has been identified by the patient as the family member/significant other with whom the patient will be residing, and identified as the person(s) who will aid the patient in the event of a mental health crisis (suicidal ideations/suicide attempt).  With written consent from the patient, the family member/significant other has been provided the following suicide prevention education, prior to the and/or following the discharge of the patient.  The suicide prevention education provided includes the following:  Suicide risk factors  Suicide prevention and interventions  National Suicide Hotline telephone number  [facility] assessment telephone number  [facility] Emergency Assistance 911  [facility] and/or Residential Mobile Crisis Unit telephone number  Request made of family/significant other to:  Remove weapons (e.g., guns, rifles, knives), all items previously/currently identified as safety concern.    Remove drugs/medications (over-the-counter, prescriptions, illicit drugs), all items previously/currently identified as a safety concern.  The family member/significant other verbalizes understanding of the suicide prevention education information provided.  The family member/significant other agrees to remove the items of safety concern listed above.  Daughter reports that the patient has been evicted due to altercations with others at his neighbors.  Daughter reports that after pt saw the notification he began having chest pains and called the EMS.  Daughter reports that family would like for the patient to be able to go to a group home.  Daughter requested that he not know that family is requesting that patient go to a group home.  She does report limited supports from family.  She reports that patient does  receive special assistance income from social services.  She requested to have the psychiatrist to review patient and where the patient would be appropriate for placement.  She reports that the patient can return home until March 9th.   Harden Mo 03/19/2020, 12:00 PM

## 2020-03-19 NOTE — Plan of Care (Signed)
Pt was very agitated at the beginning of the shift. Pt was pacing and using verbal aggression with staff.  The patient also hit the doors several times with his closed fist, while shouting. The patient threw a cup of water at a staff member, and refused to listen to redirections. The patient had grandiose illusions and claimed that he owned the [employer] and was a general. The patient declined to take his oral medications. The patient was given IM injection per his order. The patient is currently resting quietly on his bed. Will continue to monitor.  Problem: Safety: Goal: Violent Restraint(s) Outcome: Not Progressing   Problem: Education: Goal: Knowledge of [facility] Education information/materials will improve Outcome: Not Progressing Goal: Emotional status will improve Outcome: Not Progressing Goal: Mental status will improve Outcome: Not Progressing Goal: Verbalization of understanding the information provided will improve Outcome: Not Progressing   Problem: Activity: Goal: Interest or engagement in activities will improve Outcome: Not Progressing Goal: Sleeping patterns will improve Outcome: Not Progressing   Problem: Coping: Goal: Ability to verbalize frustrations and anger appropriately will improve Outcome: Not Progressing Goal: Ability to demonstrate self-control will improve Outcome: Not Progressing   Problem: Health Behavior/Discharge Planning: Goal: Identification of resources available to assist in meeting health care needs will improve Outcome: Not Progressing Goal: Compliance with treatment plan for underlying cause of condition will improve Outcome: Not Progressing   Problem: Physical Regulation: Goal: Ability to maintain clinical measurements within normal limits will improve Outcome: Not Progressing   Problem: Safety: Goal: Periods of time without injury will increase Outcome: Not Progressing   Problem: Activity: Goal: Will verbalize the importance  of balancing activity with adequate rest periods Outcome: Not Progressing   Problem: Education: Goal: Will be free of psychotic symptoms Outcome: Not Progressing Goal: Knowledge of the prescribed therapeutic regimen will improve Outcome: Not Progressing   Problem: Coping: Goal: Coping ability will improve Outcome: Not Progressing Goal: Will verbalize feelings Outcome: Not Progressing   Problem: Health Behavior/Discharge Planning: Goal: Compliance with prescribed medication regimen will improve Outcome: Not Progressing   Problem: Nutritional: Goal: Ability to achieve adequate nutritional intake will improve Outcome: Not Progressing   Problem: Role Relationship: Goal: Ability to communicate needs accurately will improve Outcome: Not Progressing Goal: Ability to interact with others will improve Outcome: Not Progressing   Problem: Safety: Goal: Ability to redirect hostility and anger into socially appropriate behaviors will improve Outcome: Not Progressing Goal: Ability to remain free from injury will improve Outcome: Not Progressing   Problem: Self-Care: Goal: Ability to participate in self-care as condition permits will improve Outcome: Not Progressing   Problem: Self-Concept: Goal: Will verbalize positive feelings about self Outcome: Not Progressing

## 2020-03-19 NOTE — Progress Notes (Signed)
[facility] MD Progress Note  03/19/2020 4:31 PM Austin Rhodes  MRN:  [id number]   CC: "Open my conference room"   Subjective:  70 year old male with bipolar disorder presenting under IVC petition for manic episode with psychosis. Overnight patient exhibited escalating agression cursing at staff, banging on door, and throwing cup of water at staff. He was unable to be redirected, and refused oral medication for agitation. IM injection given for safety of the unit. Patient eventually began to sleep, and has been placed on 1:1. He was medication compliant this morning. ADLs continue to be impaired.   Patient seen one-on-one again today. He has written a sign to not enter his room. 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 He declares that we are in bankruptcy and violating the law by keeping him here. He requests [employer], then abruptly turns to enter room and tells me to leave until his conference room is opened.   Principal Problem: Bipolar affective disorder, current episode manic with psychotic symptoms (HCC) Diagnosis: Principal Problem:   Bipolar affective disorder, current episode manic with psychotic symptoms (HCC) Active Problems:   HTN (hypertension)   GERD (gastroesophageal reflux disease)   History of CVA (cerebrovascular accident)   BPH (benign prostatic hyperplasia)   SVT (supraventricular tachycardia) (HCC)  Total Time spent with patient: 30 minutes  Past Psychiatric History: See previous  Past Medical History:  Past Medical History:  Diagnosis Date  . Bipolar disorder (HCC)   . Coronary artery disease   . GERD (gastroesophageal reflux disease)   . History of multiple strokes   . PTSD (post-traumatic stress disorder)     Past Surgical History:  Procedure  Laterality Date  . CARDIAC CATHETERIZATION     Family History: History reviewed. No pertinent family history. Family Psychiatric  History: Denied Social History:  Social History   Substance and Sexual Activity  Alcohol Use No     Social History   Substance and Sexual Activity  Drug Use Never    Social History   Socioeconomic History  . Marital status: Divorced    Spouse name: Not on file  . Number of children: Not on file  . Years of education: Not on file  . Highest education level: Not on file  Occupational History  . Not on file  Tobacco Use  . Smoking status: Never Smoker  . Smokeless tobacco: Never Used  Vaping Use  . Vaping Use: Never used  Substance and Sexual Activity  . Alcohol use: No  . Drug use: Never  . Sexual activity: Not on file  Other Topics Concern  . Not on file  Social History Narrative  . Not on file   Social Determinants of Health   Financial Resource Strain: Not on file  Food Insecurity: Not on file  Transportation Needs: Not on file  Physical Activity: Not on file  Stress: Not on file  Social Connections: Not on file   Additional Social History:                         Sleep: Fair  Appetite:  Fair  Current Medications: Current Facility-Administered Medications  Medication Dose Route Frequency Provider Last Rate Last Admin  . acetaminophen (TYLENOL) tablet  650 mg  650 mg Oral Q6H PRN Clapacs, Jackquline Denmark, MD   650 mg at 03/17/20 2108  . alum & mag hydroxide-simeth (MAALOX/MYLANTA) 200-200-20 MG/5ML suspension 30 mL  30 mL Oral Q4H PRN Clapacs, John T, MD      . clopidogrel (PLAVIX) tablet 75 mg  75 mg Oral Daily Clapacs, Jackquline Denmark, MD   75 mg at 03/19/20 1246  . divalproex (DEPAKOTE) DR tablet 750 mg  750 mg Oral Q12H Jesse Sans, MD   750 mg at 03/19/20 1246   Or  . OLANZapine (ZYPREXA) injection 10 mg  10 mg Intramuscular Q12H Jesse Sans, MD      . feeding supplement (ENSURE ENLIVE / ENSURE PLUS) liquid 237 mL  237  mL Oral TID BM Jesse Sans, MD   237 mL at 03/18/20 2000  . magnesium hydroxide (MILK OF MAGNESIA) suspension 30 mL  30 mL Oral Daily PRN Clapacs, John T, MD      . metoprolol succinate (TOPROL-XL) 24 hr tablet 50 mg  50 mg Oral Daily Clapacs, John T, MD   50 mg at 03/19/20 1246  . multivitamin with minerals tablet 1 tablet  1 tablet Oral Daily Jesse Sans, MD   1 tablet at 03/19/20 1246  . OLANZapine (ZYPREXA) tablet 10 mg  10 mg Oral Q6H PRN Jesse Sans, MD   10 mg at 03/18/20 1423  . QUEtiapine (SEROQUEL) tablet 400 mg  400 mg Oral QHS Clapacs, John T, MD   400 mg at 03/19/20 0100  . simethicone (MYLICON) chewable tablet 80 mg  80 mg Oral QID PRN Jesse Sans, MD      . tamsulosin [facility]) capsule 0.4 mg  0.4 mg Oral QPC supper Clapacs, John T, MD   0.4 mg at 03/18/20 1707  . ziprasidone (GEODON) injection 20 mg  20 mg Intramuscular Q6H PRN Jesse Sans, MD   20 mg at 03/18/20 1940    Lab Results: No results found for this or any previous visit (from the past 48 hour(s)).  Blood Alcohol level:  Lab Results  Component Value Date   ETH <10 03/13/2020   ETH <10 12/16/2018    Metabolic Disorder Labs: Lab Results  Component Value Date   HGBA1C 5.5 03/15/2020   MPG 111.15 03/15/2020   MPG 114.02 03/13/2020   No results found for: PROLACTIN Lab Results  Component Value Date   CHOL 115 03/15/2020   TRIG 117 03/15/2020   HDL 52 03/15/2020   CHOLHDL 2.2 03/15/2020   VLDL 23 03/15/2020   LDLCALC 40 03/15/2020   LDLCALC 53 03/13/2020    Physical Findings: AIMS:  , ,  ,  ,    CIWA:    COWS:     Musculoskeletal: Strength & Muscle Tone: within normal limits Gait & Station: normal Patient leans: N/A  Psychiatric Specialty Exam: Physical Exam   Review of Systems   Blood pressure 131/90, pulse (!) 119, temperature 98.9 F (37.2 C), temperature source Oral, resp. rate 18, height 5\' 10"  (1.778 m), weight 71.2 kg, SpO2 99 %.Body mass index is 22.53 kg/m.   General Appearance: Disheveled  Eye Contact:  Intense, glaring  Speech:  Pressured  Volume:  Increased  Mood:  Irritable  Affect:  Congruent and Labile  Thought Process:  Disorganized  Orientation:  Full (Time, Place, and Person)  Thought Content:  Illogical, Delusions, Rumination and Tangential  Suicidal Thoughts:  No  Homicidal Thoughts:  No  Memory:  Immediate;   Fair Recent;   Poor Remote;   Poor  Judgement:  Impaired  Insight:  Lacking  Psychomotor Activity:  Restlessness  Concentration:  Concentration: Poor  Recall:  Poor  Fund of Knowledge:  Poor  Language:  Fair  Akathisia:  Negative  Handed:  Right  AIMS (if indicated):     Assets:  Desire for Improvement Housing Resilience  ADL's:  Impaired  Cognition:  Impaired,  Mild  Sleep:  Number of Hours: 7.5     Treatment Plan Summary: Daily contact with patient to assess and evaluate symptoms and progress in treatment and Medication management  1) Bipolar Affective Disorder, current episode manic with psychotic symptoms- established problem, unstable - Patient grandiose, hyperverbal, tangential, and irritable on exam. Patient continues to exhibit aggression towards staff - Non-emergent forced medication order in place through Feb 21st. If patient refuses oral Depakote 750 mg BID, he will receive Zyprexa 10 mg IM - Once taking Depakote consistently for 4 days, will check valproic acid level - Continue Seroquel 400 mg QHS   2-4) History of CVA, SVT, HTN- established problems, all stable - Plavix 75 mg daily, metoprolol XL 50 mg daily  5) BPH- established problem, stable - Flomax 0.4 mg daily  03/19/20 Psychiatric exam above reviewed and remains accurate. Assessment and plan above reviewed and updated.    Jesse Sans, MD 03/19/2020, 4:31 PM

## 2020-03-20 NOTE — Progress Notes (Signed)
[facility] MD Progress Note  03/20/2020 2:06 PM Adriene Padula Nierenberg  MRN:  [id number]   CC: "Get my daughter on the phone"   Subjective:  70 year old male with bipolar disorder presenting under IVC petition for manic episode with psychosis. Overnight patient did not have any acute events. He was given non-emergent forced medications after refusal of oral Depakote. He was medication compliant this morning. ADLs remain impaired.   Patient seen one-on-one again today. He remains quite irritable, grandiose, delusional, and with flight of ideas. 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 He continues to demand to be discharged. When asked if he wanted assistance on where to live when he leaves, he gets rather irate. He mentions he owns several homes and properties, and needs no assistance. He also chooses to not acknowledge his eviction at his apartment. He demands that I call his daughter.   Cheryl Flash, [phone]: No answer, voicemail left with call back number.   Principal Problem: Bipolar affective disorder, current episode manic with psychotic symptoms (HCC) Diagnosis: Principal Problem:   Bipolar affective disorder, current episode manic with psychotic symptoms (HCC) Active Problems:   HTN (hypertension)   GERD (gastroesophageal reflux disease)   History of CVA (cerebrovascular accident)   BPH (benign prostatic hyperplasia)   SVT (supraventricular tachycardia) (HCC)  Total Time spent with patient: 30 minutes  Past Psychiatric History: See previous  Past Medical History:  Past Medical History:  Diagnosis Date  . Bipolar disorder (HCC)   . Coronary artery disease   . GERD (gastroesophageal reflux disease)   . History of multiple strokes   . PTSD (post-traumatic stress disorder)     Past Surgical History:  Procedure Laterality Date  . CARDIAC CATHETERIZATION     Family History: History reviewed. No  pertinent family history. Family Psychiatric  History: Denied Social History:  Social History   Substance and Sexual Activity  Alcohol Use No     Social History   Substance and Sexual Activity  Drug Use Never    Social History   Socioeconomic History  . Marital status: Divorced    Spouse name: Not on file  . Number of children: Not on file  . Years of education: Not on file  . Highest education level: Not on file  Occupational History  . Not on file  Tobacco Use  . Smoking status: Never Smoker  . Smokeless tobacco: Never Used  Vaping Use  . Vaping Use: Never used  Substance and Sexual Activity  . Alcohol use: No  . Drug use: Never  . Sexual activity: Not on file  Other Topics Concern  . Not on file  Social History Narrative  . Not on file   Social Determinants of Health   Financial Resource Strain: Not on file  Food Insecurity: Not on file  Transportation Needs: Not on file  Physical Activity: Not on file  Stress: Not on file  Social Connections: Not on file   Additional Social History:                         Sleep: Fair  Appetite:  Fair  Current Medications: Current Facility-Administered Medications  Medication Dose Route Frequency Provider Last Rate Last Admin  . acetaminophen (TYLENOL) tablet 650 mg  650 mg Oral Q6H PRN Clapacs, Jackquline Denmark, MD   650 mg at 03/19/20 1757  . alum & mag hydroxide-simeth (  MAALOX/MYLANTA) 200-200-20 MG/5ML suspension 30 mL  30 mL Oral Q4H PRN Clapacs, John T, MD      . clopidogrel (PLAVIX) tablet 75 mg  75 mg Oral Daily Clapacs, Jackquline Denmark, MD   75 mg at 03/20/20 0725  . divalproex (DEPAKOTE) DR tablet 750 mg  750 mg Oral Q12H Jesse Sans, MD   750 mg at 03/20/20 7035   Or  . OLANZapine (ZYPREXA) injection 10 mg  10 mg Intramuscular Q12H Jesse Sans, MD   10 mg at 03/19/20 2011  . feeding supplement (ENSURE ENLIVE / ENSURE PLUS) liquid 237 mL  237 mL Oral TID BM Jesse Sans, MD   237 mL at 03/20/20 1035  .  magnesium hydroxide (MILK OF MAGNESIA) suspension 30 mL  30 mL Oral Daily PRN Clapacs, John T, MD      . metoprolol succinate (TOPROL-XL) 24 hr tablet 50 mg  50 mg Oral Daily Clapacs, Jackquline Denmark, MD   50 mg at 03/20/20 0725  . multivitamin with minerals tablet 1 tablet  1 tablet Oral Daily Jesse Sans, MD   1 tablet at 03/20/20 0725  . OLANZapine (ZYPREXA) tablet 10 mg  10 mg Oral Q6H PRN Jesse Sans, MD   10 mg at 03/20/20 0093  . QUEtiapine (SEROQUEL) tablet 400 mg  400 mg Oral QHS Clapacs, John T, MD   400 mg at 03/19/20 0100  . simethicone (MYLICON) chewable tablet 80 mg  80 mg Oral QID PRN Jesse Sans, MD   80 mg at 03/20/20 1024  . tamsulosin (FLOMAX) capsule 0.4 mg  0.4 mg Oral QPC supper Clapacs, John T, MD   0.4 mg at 03/18/20 1707  . ziprasidone (GEODON) injection 20 mg  20 mg Intramuscular Q6H PRN Jesse Sans, MD   20 mg at 03/19/20 2011    Lab Results: No results found for this or any previous visit (from the past 48 hour(s)).  Blood Alcohol level:  Lab Results  Component Value Date   ETH <10 03/13/2020   ETH <10 12/16/2018    Metabolic Disorder Labs: Lab Results  Component Value Date   HGBA1C 5.5 03/15/2020   MPG 111.15 03/15/2020   MPG 114.02 03/13/2020   No results found for: PROLACTIN Lab Results  Component Value Date   CHOL 115 03/15/2020   TRIG 117 03/15/2020   HDL 52 03/15/2020   CHOLHDL 2.2 03/15/2020   VLDL 23 03/15/2020   LDLCALC 40 03/15/2020   LDLCALC 53 03/13/2020    Physical Findings: AIMS:  , ,  ,  ,    CIWA:    COWS:     Musculoskeletal: Strength & Muscle Tone: within normal limits Gait & Station: normal Patient leans: N/A  Psychiatric Specialty Exam: Physical Exam   Review of Systems   Blood pressure (!) 135/92, pulse 67, temperature 98.1 F (36.7 C), temperature source Oral, resp. rate 18, height 5\' 10"  (1.778 m), weight 71.2 kg, SpO2 100 %.Body mass index is 22.53 kg/m.  General Appearance: Disheveled  Eye  Contact:  Intense, glaring  Speech:  Pressured  Volume:  Increased  Mood:  Irritable  Affect:  Congruent and Labile  Thought Process:  Disorganized  Orientation:  Full (Time, Place, and Person)  Thought Content:  Illogical, Delusions, Rumination and Tangential  Suicidal Thoughts:  No  Homicidal Thoughts:  No  Memory:  Immediate;   Fair Recent;   Poor Remote;   Poor  Judgement:  Impaired  Insight:  Lacking  Psychomotor Activity:  Restlessness  Concentration:  Concentration: Poor  Recall:  Poor  Fund of Knowledge:  Poor  Language:  Fair  Akathisia:  Negative  Handed:  Right  AIMS (if indicated):     Assets:  Desire for Improvement Housing Resilience  ADL's:  Impaired  Cognition:  Impaired,  Mild  Sleep:  Number of Hours: 5.75     Treatment Plan Summary: Daily contact with patient to assess and evaluate symptoms and progress in treatment and Medication management  1) Bipolar Affective Disorder, current episode manic with psychotic symptoms- established problem, unstable - Patient grandiose, hyperverbal, tangential, and irritable on exam. Patient continues to exhibit aggression towards staff - Non-emergent forced medication order in place through Feb 21st. If patient refuses oral Depakote 750 mg BID, he will receive Zyprexa 10 mg IM - Once taking Depakote consistently for 4 days, will check valproic acid level - Continue Seroquel 400 mg QHS   2-4) History of CVA, SVT, HTN- established problems, all stable - Plavix 75 mg daily, metoprolol XL 50 mg daily  5) BPH- established problem, stable - Flomax 0.4 mg daily  03/20/20: Psychiatric exam above reviewed and remains accurate. Assessment and plan above reviewed and updated.     Jesse Sans, MD 03/20/2020, 2:06 PM

## 2020-03-20 NOTE — Progress Notes (Signed)
Patient is on 1:1 while awake. Patient is asleep in his room at this time without complaint. Patient remains safe on the unit.

## 2020-03-20 NOTE — [facility] Group Notes (Signed)
LCSW Group Therapy Note  03/20/2020 2:11 PM  Type of Therapy/Topic:  Group Therapy:  Emotion Regulation  Participation Level:  Did Not Attend   Description of Group:   The purpose of this group is to assist patients in learning to regulate negative emotions and experience positive emotions. Patients will be guided to discuss ways in which they have been vulnerable to their negative emotions. These vulnerabilities will be juxtaposed with experiences of positive emotions or situations, and patients will be challenged to use positive emotions to combat negative ones. Special emphasis will be placed on coping with negative emotions in conflict situations, and patients will process healthy conflict resolution skills.  Therapeutic Goals: 1. Patient will identify two positive emotions or experiences to reflect on in order to balance out negative emotions 2. Patient will label two or more emotions that they find the most difficult to experience 3. Patient will demonstrate positive conflict resolution skills through discussion and/or role plays  Summary of Patient Progress:  Patient unable to attend due to being on the back hall.   Therapeutic Modalities:   Cognitive Behavioral Therapy Feelings Identification Dialectical Behavioral Therapy  Penni Homans, MSW, LCSW 03/20/2020 2:11 PM

## 2020-03-20 NOTE — Progress Notes (Signed)
Pt is agitated and banging on the doors. Thought content is very accusatory and he demands to be let out. PO Zyprexa was given.

## 2020-03-20 NOTE — Progress Notes (Signed)
Pt is calmer and less hyperverbal after eating lunch. He is less restless and is watching TV in the dayroom with a 1:1 sitter. Pt remains safe at this time.

## 2020-03-20 NOTE — Progress Notes (Signed)
Recreation Therapy Notes  INPATIENT RECREATION THERAPY ASSESSMENT  Patient Details Name: Austin Rhodes MRN: [id number] DOB: 01/13/1951 Today's Date: 03/20/2020       Information Obtained From: Chart Review  Able to Participate in Assessment/Interview: No  Patient Presentation:    Reason for Admission (Per Patient):    Patient Stressors:    Coping Skills:   Read,Write  Leisure Interests (2+):  Individual - Writing,Individual - Reading,Nature - Fishing (Being outdoors)  Frequency of Recreation/Participation: Monthly  Awareness of Community Resources:     [employer]:     Current Use:    If no, Barriers?:    Expressed Interest in [employer] Information:    [place] of Residence:  Film/video editor  Patient Main Form of Transportation:    Patient Strengths:  Very intelligent  Patient Identified Areas of Improvement:     Patient Goal for Hospitalization:     Current SI (including self-harm):     Current HI:     Current AVH:    Staff Intervention Plan: Group Attendance,Collaborate with Interdisciplinary Treatment Team  Consent to Intern Participation: N/A  Hennessy Bartel 03/20/2020, 12:08 PM

## 2020-03-20 NOTE — Progress Notes (Signed)
Patient is asleep and without complaint at this time.

## 2020-03-20 NOTE — [facility] Group Notes (Signed)
[facility] Group Notes:  (Nursing/MHT/Case Management/Adjunct)  Date:  03/20/2020  Time:  9:57 PM  Type of Therapy:  Group Therapy  Participation Level:  Did Not Attend  Participation Quality:  Pt. on back hall.  Mayra Neer 03/20/2020, 9:57 PM

## 2020-03-20 NOTE — Progress Notes (Signed)
Patient refused his scheduled medications tonight. Check MAR

## 2020-03-20 NOTE — Plan of Care (Signed)
Patient presents manic, restless, delusional with pressured speech   Problem: Education: Goal: Emotional status will improve Outcome: Not Progressing Goal: Mental status will improve Outcome: Not Progressing

## 2020-03-20 NOTE — Progress Notes (Signed)
Patient is walking around in the back hall with a 1:1 sitter. He is irritable and banging on the doors. 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

## 2020-03-20 NOTE — Progress Notes (Signed)
Patient is sitting in dayroom with 1:1 sitter. Pt continues to have delusions and states, "they are not supposed to touch me. I have sovereign immunity. I paid for all the lights here". Pt is restless and irritable. He is also complaining of neck pain. Pt was given Tylenol and Zyprexa for the pain/agitation.

## 2020-03-20 NOTE — Progress Notes (Signed)
Patient awake and sitting in the dayroom on the locked hall with a sitter present. Patient irritable this morning but redirectable. Patient remains safe on the unit.

## 2020-03-20 NOTE — Progress Notes (Signed)
Patient compliant with medication administration per MD orders tonight, after the patient threw the pills at this writer. Patient given education and was then compliant. Patient is on 1:1 for safety, presents bizarre, delusional, and with rapid speech. Patient remains safe on the unit.

## 2020-03-20 NOTE — Progress Notes (Signed)
Pt was awake watching TV in the dayroom on the locked hall. Pt has grandiose delusions and said, "I am going to be on [employer] this morning". Pt was cooperative with taking vital signs. Pt also took all his medications, but stated, "I don't want Depakote." However, pt ended up taking the Depakote and said, "I need to be relaxed when I am on TV". Pt also ate his breakfast and denies any physical problems at the time of assessment.

## 2020-03-20 NOTE — Progress Notes (Signed)
Recreation Therapy Notes  INPATIENT RECREATION TR PLAN  Patient Details Name: Tavares Levinson MRN: [id number] DOB: [birth year] Today's Date: 03/20/2020  Rec Therapy Plan Is patient appropriate for Therapeutic Recreation?: Yes Treatment times per week: at least 3 Estimated Length of Stay: 5-7 days TR Treatment/Interventions: Group participation (Comment)  Discharge Criteria Pt will be discharged from therapy if:: Discharged  Discharge Summary     Addalie Calles 03/20/2020, 4:08 PM

## 2020-03-20 NOTE — Progress Notes (Signed)
Recreation Therapy Notes  Date: 03/20/2020  Time: 9:30 am   Location: Craft room     Behavioral response: N/A   Intervention Topic: Goals   Discussion/Intervention: Patient did not attend group.   Clinical Observations/Feedback:  Patient did not attend group.   Ceola Para LRT/CTRS        Jihan Rudy 03/20/2020 11:37 AM 

## 2020-03-21 MED ORDER — LAMOTRIGINE 25 MG PO TABS
25.0000 mg | ORAL_TABLET | Freq: Every day | ORAL | Status: DC
  Administered 2020-03-21 – 2020-03-27 (×7): 25 mg via ORAL
  Filled 2020-03-21 (×8): qty 1

## 2020-03-21 MED ORDER — SIMETHICONE 80 MG PO CHEW
80.0000 mg | CHEWABLE_TABLET | Freq: Two times a day (BID) | ORAL | Status: DC
  Administered 2020-03-21 – 2020-03-27 (×12): 80 mg via ORAL
  Filled 2020-03-21 (×12): qty 1

## 2020-03-21 NOTE — [facility] Group Notes (Signed)
LCSW Group Therapy Note     03/21/2020 2:26 PM     Type of Therapy/Topic:  Group Therapy:  Balance in Life     Participation Level:  Did Not Attend     Description of Group:    This group will address the concept of balance and how it feels and looks when one is unbalanced. Patients will be encouraged to process areas in their lives that are out of balance and identify reasons for remaining unbalanced. Facilitators will guide patients in utilizing problem-solving interventions to address and correct the stressor making their life unbalanced. Understanding and applying boundaries will be explored and addressed for obtaining and maintaining a balanced life. Patients will be encouraged to explore ways to assertively make their unbalanced needs known to significant others in their lives, using other group members and facilitator for support and feedback.     Therapeutic Goals:  1.      Patient will identify two or more emotions or situations they have that consume much of in their lives.  2.      Patient will identify signs/triggers that life has become out of balance:  3.      Patient will identify two ways to set boundaries in order to achieve balance in their lives:  4.      Patient will demonstrate ability to communicate their needs through discussion and/or role plays     Summary of Patient Progress: X      Therapeutic Modalities:   Cognitive Behavioral Therapy  Solution-Focused Therapy  Assertiveness Training     Austin Rhodes MSW, LCSW-A  03/21/2020 2:26 PM

## 2020-03-21 NOTE — Progress Notes (Signed)
Patient is in dayroom with 1:1 sitter. He took his Lamictal without hesitation. He is hyperverbal but sitting down and appears less restless than on previous encounters.

## 2020-03-21 NOTE — Progress Notes (Signed)
Pt was less irritable than on previous encounters. Vitals were taken and medications were given. Pt did not want to take Depakote, stating he was allergic to the generic brand of Depakote. He also stated he did not want to hear the word Depakote again. Pt became more irritable when this writer encouraged him to take his medications, but he took all medications except the multivitamin, which he believed to be the Depakote.   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 Pt is restless and is walking around in his room. Pt has a 1:1 sitter and remains safe at this time.

## 2020-03-21 NOTE — Progress Notes (Signed)
Patient is sitting in the dayroom with a 1:1 sitter. Pt is less restless and less irritable than when this writer previously gave him his medications. Pt took a shower, then took his multivitamin and Ensure. Pt is still having delusions of grandeur.

## 2020-03-21 NOTE — [facility] Counselor (Signed)
CSW spoke with the patient on aftercare plans.  CSW spoke with the patient on group homes.  Patient curses and stated that he would not be going to a group home as he is the owner of several buildings and homes.    CSW spoke with the patient on aftercare with a therapist and psychiatrist for continued medication needs.  Paitent declined referral stating that he is "marrying the 1st General of the [employer]" and that "she will meet all my needs".  Patient was aggressive and yelling initially however was redirectable. Patient stated several times that he wished to leave the hospital and asked CSW to take him to get his laundry and to let him off the unit.  Penni Homans, MSW, LCSW 03/21/2020 3:40 PM

## 2020-03-21 NOTE — Progress Notes (Signed)
Pt woke up from a nap at 5pm. Pt was animated and in a pleasant mood. Patient states, "we are writing a book together and we will sell a billion copies". Patient took his Flomax and Mylicon with no hesitation and ate his dinner. Patient is in the dayroom with a 1:1 sitter and remains safe at this time.

## 2020-03-21 NOTE — Progress Notes (Signed)
Pt is very fixated on leaving the hospital and says, "get me out of this cage". He is repeatedly banging on the doors and is verbally aggressive towards MHT. Geodon was given. While in the room, pt states, "so you're giving me another shot". Pt holds out his arm and willingly takes the Geodon.

## 2020-03-21 NOTE — Progress Notes (Signed)
Patient is on 1:1 with a sitter present. Patient compliant with medication administration per MD orders. Patient presents delusional, flight of ideas, grandiose. Patient irritable and agitated with staff. Patient is redirectable. Patient given education and support. Patient remains safe on the unit.

## 2020-03-21 NOTE — Progress Notes (Signed)
[facility] MD Progress Note  03/21/2020 12:34 PM Austin Rhodes  MRN:  [id number]   CC: "Do you want to go to the Olympics"   Subjective:  70 year old male with bipolar disorder presenting under IVC petition for manic episode with psychosis. Overnight patient did not have any acute events. Patient compliant with medications, and showered this morning.  Patient seen one-on-one again today. Initially patient was pleasantly manic. He continued to exhibit grandiosity, delusions, and flight of ideas. He offers to fly myself and the PA student to the Olympics to watch the final games. He also reiterates his numerous famous wives including Austin Rhodes, [employer] anchors, etc. He then asks when I am opening the door for him. When explaining he is not being discharged today he then becomes quite irate. He stands and postures aggressively towards provider. Interview terminated at that time. He later is found attempting to pull the fire alarm and is aggressive towards nursing staff as well. He does agree to take oral Zyprexa for agitation. He remains on 1:1 for safety.   Austin Rhodes, [phone]: She notes that her dad typically does the best on Lamictal, Depakote, and Seroquel combination. She is trying to appeal his recent eviction as he was manic at the time. She is working with a Clinical research associate at this time. She notes that he has been living in this senior living apartment in locked unit since 2019, and she is hoping he can continue living her. She requests a letter stating he is in the hospital now for treatment of mania. Letter faxed to [phone].  Principal Problem: Bipolar affective disorder, current episode manic with psychotic symptoms (HCC) Diagnosis: Principal Problem:   Bipolar affective disorder, current episode manic with psychotic symptoms (HCC) Active Problems:   HTN (hypertension)   GERD (gastroesophageal reflux disease)   History of CVA (cerebrovascular accident)   BPH (benign prostatic  hyperplasia)   SVT (supraventricular tachycardia) (HCC)  Total Time spent with patient: 30 minutes  Past Psychiatric History: See previous  Past Medical History:  Past Medical History:  Diagnosis Date  . Bipolar disorder (HCC)   . Coronary artery disease   . GERD (gastroesophageal reflux disease)   . History of multiple strokes   . PTSD (post-traumatic stress disorder)     Past Surgical History:  Procedure Laterality Date  . CARDIAC CATHETERIZATION     Family History: History reviewed. No pertinent family history. Family Psychiatric  History: Denied Social History:  Social History   Substance and Sexual Activity  Alcohol Use No     Social History   Substance and Sexual Activity  Drug Use Never    Social History   Socioeconomic History  . Marital status: Divorced    Spouse name: Not on file  . Number of children: Not on file  . Years of education: Not on file  . Highest education level: Not on file  Occupational History  . Not on file  Tobacco Use  . Smoking status: Never Smoker  . Smokeless tobacco: Never Used  Vaping Use  . Vaping Use: Never used  Substance and Sexual Activity  . Alcohol use: No  . Drug use: Never  . Sexual activity: Not on file  Other Topics Concern  . Not on file  Social History Narrative  . Not on file   Social Determinants of Health   Financial Resource Strain: Not on file  Food Insecurity: Not on file  Transportation Needs: Not on file  Physical Activity: Not on file  Stress: Not on file  Social Connections: Not on file   Additional Social History:                         Sleep: Fair  Appetite:  Fair  Current Medications: Current Facility-Administered Medications  Medication Dose Route Frequency Provider Last Rate Last Admin  . acetaminophen (TYLENOL) tablet 650 mg  650 mg Oral Q6H PRN Rhodes, Austin Denmark, MD   650 mg at 03/21/20 1159  . alum & mag hydroxide-simeth (MAALOX/MYLANTA) 200-200-20 MG/5ML suspension  30 mL  30 mL Oral Q4H PRN Rhodes, Austin T, MD      . clopidogrel (PLAVIX) tablet 75 mg  75 mg Oral Daily Rhodes, Austin Denmark, MD   75 mg at 03/21/20 0856  . divalproex (DEPAKOTE) DR tablet 750 mg  750 mg Oral Q12H Austin Sans, MD   750 mg at 03/21/20 0856   Or  . OLANZapine (ZYPREXA) injection 10 mg  10 mg Intramuscular Q12H Austin Sans, MD   10 mg at 03/19/20 2011  . feeding supplement (ENSURE ENLIVE / ENSURE PLUS) liquid 237 mL  237 mL Oral TID BM Austin Sans, MD   237 mL at 03/21/20 1020  . lamoTRIgine (LAMICTAL) tablet 25 mg  25 mg Oral Daily Austin Sans, MD      . magnesium hydroxide (MILK OF MAGNESIA) suspension 30 mL  30 mL Oral Daily PRN Rhodes, Austin T, MD      . metoprolol succinate (TOPROL-XL) 24 hr tablet 50 mg  50 mg Oral Daily Rhodes, Austin Denmark, MD   50 mg at 03/21/20 0856  . multivitamin with minerals tablet 1 tablet  1 tablet Oral Daily Austin Sans, MD   1 tablet at 03/21/20 1020  . OLANZapine (ZYPREXA) tablet 10 mg  10 mg Oral Q6H PRN Austin Sans, MD   10 mg at 03/21/20 1129  . QUEtiapine (SEROQUEL) tablet 400 mg  400 mg Oral QHS Rhodes, Austin T, MD   400 mg at 03/20/20 1956  . simethicone (MYLICON) chewable tablet 80 mg  80 mg Oral BID WC Austin Sans, MD      . tamsulosin [facility]) capsule 0.4 mg  0.4 mg Oral QPC supper Rhodes, Austin T, MD   0.4 mg at 03/18/20 1707  . ziprasidone (GEODON) injection 20 mg  20 mg Intramuscular Q6H PRN Austin Sans, MD   20 mg at 03/19/20 2011    Lab Results: No results found for this or any previous visit (from the past 48 hour(s)).  Blood Alcohol level:  Lab Results  Component Value Date   ETH <10 03/13/2020   ETH <10 12/16/2018    Metabolic Disorder Labs: Lab Results  Component Value Date   HGBA1C 5.5 03/15/2020   MPG 111.15 03/15/2020   MPG 114.02 03/13/2020   No results found for: PROLACTIN Lab Results  Component Value Date   CHOL 115 03/15/2020   TRIG 117 03/15/2020   HDL 52 03/15/2020    CHOLHDL 2.2 03/15/2020   VLDL 23 03/15/2020   LDLCALC 40 03/15/2020   LDLCALC 53 03/13/2020    Physical Findings: AIMS:  , ,  ,  ,    CIWA:    COWS:     Musculoskeletal: Strength & Muscle Tone: within normal limits Gait & Station: normal Patient leans: N/A  Psychiatric Specialty Exam: Physical Exam   Review of Systems   Blood pressure (!) 143/87, pulse 81,  temperature 98 F (36.7 C), temperature source Oral, resp. rate 18, height 5\' 10"  (1.778 m), weight 71.2 kg, SpO2 95 %.Body mass index is 22.53 kg/m.  General Appearance: Casual  Eye Contact:  Good  Speech:  Pressured  Volume:  Increased  Mood:  Labile  Affect:  Congruent and Labile  Thought Process:  Disorganized  Orientation:  Full (Time, Place, and Person)  Thought Content:  Illogical, Delusions, Rumination and Tangential  Suicidal Thoughts:  No  Homicidal Thoughts:  No  Memory:  Immediate;   Fair Recent;   Poor Remote;   Poor  Judgement:  Impaired  Insight:  Lacking  Psychomotor Activity:  Restlessness  Concentration:  Concentration: Poor  Recall:  Poor  Fund of Knowledge:  Poor  Language:  Fair  Akathisia:  Negative  Handed:  Right  AIMS (if indicated):     Assets:  Desire for Improvement Housing Resilience  ADL's:  Impaired  Cognition:  Impaired,  Mild  Sleep:  Number of Hours: 8.5     Treatment Plan Summary: Daily contact with patient to assess and evaluate symptoms and progress in treatment and Medication management  1) Bipolar Affective Disorder, current episode manic with psychotic symptoms- established problem, unstable - Patient grandiose, hyperverbal, tangential, and irritable on exam. Patient continues to exhibit aggression towards staff - Non-emergent forced medication order in place through Feb 21st. If patient refuses oral Depakote 750 mg BID, he will receive Zyprexa 10 mg IM - Once taking Depakote consistently for 4 days, will check valproic acid level - Continue Seroquel 400 mg  QHS - Start Lamictal 25 mg daily and titrate biweekly. He has tolerated Lamictal 100 mg daily along with Depakote in the past despite drug-drug interaction. Will titrate slowly given questionable compliance previous to admission    2-4) History of CVA, SVT, HTN- established problems, all stable - Plavix 75 mg daily, metoprolol XL 50 mg daily  5) BPH- established problem, stable - Flomax 0.4 mg daily  03/21/20: Psychiatric exam above reviewed and remains accurate. Assessment and plan above reviewed and updated.      03/23/20, MD 03/21/2020, 12:34 PM

## 2020-03-21 NOTE — Plan of Care (Signed)
Patient presents delusional, irritable and remains on 1:1 with a sitter present   Problem: Education: Goal: Emotional status will improve Outcome: Not Progressing Goal: Mental status will improve Outcome: Not Progressing

## 2020-03-21 NOTE — Progress Notes (Signed)
Recreation Therapy Notes  Date: 03/21/2020  Time: 10:00 am   Location: Craft room  Behavioral response: N/A   Intervention Topic: Animal Assisted therapy    Discussion/Intervention: Patient did not attend group.   Clinical Observations/Feedback:  Patient did not attend group.   Keonta Monceaux LRT/CTRS         Hajar Penninger 03/21/2020 12:40 PM

## 2020-03-21 NOTE — Progress Notes (Signed)
Pt is agitated and trying to pull the fire alarm. Pt states he needs to call the fire department and is aggressive towards staff. Zyprexa PO was given.

## 2020-03-22 NOTE — Progress Notes (Signed)
[facility] MD Progress Note  03/22/2020 10:05 AM Cadence Haslam  MRN:  [id number]   CC: "You have wasted my time"   Subjective:  70 year old male with bipolar disorder presenting under IVC petition for manic episode with psychosis. Overnight patient did not have any acute events. Patient compliant with medications.   Patient seen one-on-one again today. Patient very irritable this morning stating that I have wasted his time. He claims that he makes a trillion dollars everyday. 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 He also lets me know that the police will be here to arrest me for holding him here and poisoning his body with depakote. Of note, patient is not allergic to depakote and has been on this medication for several years. He remains on 1:1 for safety.    Principal Problem: Bipolar affective disorder, current episode manic with psychotic symptoms (HCC) Diagnosis: Principal Problem:   Bipolar affective disorder, current episode manic with psychotic symptoms (HCC) Active Problems:   HTN (hypertension)   GERD (gastroesophageal reflux disease)   History of CVA (cerebrovascular accident)   BPH (benign prostatic hyperplasia)   SVT (supraventricular tachycardia) (HCC)  Total Time spent with patient: 30 minutes  Past Psychiatric History: See previous  Past Medical History:  Past Medical History:  Diagnosis Date  . Bipolar disorder (HCC)   . Coronary artery disease   . GERD (gastroesophageal reflux disease)   . History of multiple strokes   . PTSD (post-traumatic stress disorder)     Past Surgical History:  Procedure Laterality Date  . CARDIAC CATHETERIZATION     Family History: History reviewed. No pertinent family history. Family Psychiatric  History: Denied Social History:  Social History   Substance and Sexual Activity  Alcohol Use No     Social History   Substance and Sexual Activity  Drug Use Never    Social History   Socioeconomic  History  . Marital status: Divorced    Spouse name: Not on file  . Number of children: Not on file  . Years of education: Not on file  . Highest education level: Not on file  Occupational History  . Not on file  Tobacco Use  . Smoking status: Never Smoker  . Smokeless tobacco: Never Used  Vaping Use  . Vaping Use: Never used  Substance and Sexual Activity  . Alcohol use: No  . Drug use: Never  . Sexual activity: Not on file  Other Topics Concern  . Not on file  Social History Narrative  . Not on file   Social Determinants of Health   Financial Resource Strain: Not on file  Food Insecurity: Not on file  Transportation Needs: Not on file  Physical Activity: Not on file  Stress: Not on file  Social Connections: Not on file   Additional Social History:                         Sleep: Fair  Appetite:  Fair  Current Medications: Current Facility-Administered Medications  Medication Dose Route Frequency Provider Last Rate Last Admin  . acetaminophen (TYLENOL) tablet 650 mg  650 mg Oral Q6H PRN Clapacs, Jackquline Denmark, MD   650 mg at 03/21/20 1159  . alum & mag hydroxide-simeth (MAALOX/MYLANTA) 200-200-20 MG/5ML suspension 30 mL  30 mL Oral Q4H PRN Clapacs, John T, MD      . clopidogrel (PLAVIX) tablet 75 mg  75 mg Oral Daily Clapacs, Jackquline Denmark, MD  75 mg at 03/22/20 0821  . divalproex (DEPAKOTE) DR tablet 750 mg  750 mg Oral Q12H Jesse Sans, MD   750 mg at 03/22/20 0820   Or  . OLANZapine (ZYPREXA) injection 10 mg  10 mg Intramuscular Q12H Jesse Sans, MD   10 mg at 03/19/20 2011  . feeding supplement (ENSURE ENLIVE / ENSURE PLUS) liquid 237 mL  237 mL Oral TID BM Jesse Sans, MD   237 mL at 03/21/20 2111  . lamoTRIgine (LAMICTAL) tablet 25 mg  25 mg Oral Daily Jesse Sans, MD   25 mg at 03/22/20 0820  . magnesium hydroxide (MILK OF MAGNESIA) suspension 30 mL  30 mL Oral Daily PRN Clapacs, John T, MD      . metoprolol succinate (TOPROL-XL) 24 hr tablet  50 mg  50 mg Oral Daily Clapacs, Jackquline Denmark, MD   50 mg at 03/22/20 6948  . multivitamin with minerals tablet 1 tablet  1 tablet Oral Daily Jesse Sans, MD   1 tablet at 03/22/20 0820  . OLANZapine (ZYPREXA) tablet 10 mg  10 mg Oral Q6H PRN Jesse Sans, MD   10 mg at 03/22/20 0820  . QUEtiapine (SEROQUEL) tablet 400 mg  400 mg Oral QHS Clapacs, Jackquline Denmark, MD   400 mg at 03/21/20 2111  . simethicone (MYLICON) chewable tablet 80 mg  80 mg Oral BID WC Jesse Sans, MD   80 mg at 03/22/20 0820  . tamsulosin (FLOMAX) capsule 0.4 mg  0.4 mg Oral QPC supper Clapacs, Jackquline Denmark, MD   0.4 mg at 03/21/20 1654  . ziprasidone (GEODON) injection 20 mg  20 mg Intramuscular Q6H PRN Jesse Sans, MD   20 mg at 03/21/20 1456    Lab Results: No results found for this or any previous visit (from the past 48 hour(s)).  Blood Alcohol level:  Lab Results  Component Value Date   ETH <10 03/13/2020   ETH <10 12/16/2018    Metabolic Disorder Labs: Lab Results  Component Value Date   HGBA1C 5.5 03/15/2020   MPG 111.15 03/15/2020   MPG 114.02 03/13/2020   No results found for: PROLACTIN Lab Results  Component Value Date   CHOL 115 03/15/2020   TRIG 117 03/15/2020   HDL 52 03/15/2020   CHOLHDL 2.2 03/15/2020   VLDL 23 03/15/2020   LDLCALC 40 03/15/2020   LDLCALC 53 03/13/2020    Physical Findings: AIMS:  , ,  ,  ,    CIWA:    COWS:     Musculoskeletal: Strength & Muscle Tone: within normal limits Gait & Station: normal Patient leans: N/A  Psychiatric Specialty Exam: Physical Exam a  Review of Systems   Blood pressure (!) 143/82, pulse 70, temperature 98 F (36.7 C), temperature source Oral, resp. rate 16, height 5\' 10"  (1.778 m), weight 71.2 kg, SpO2 95 %.Body mass index is 22.53 kg/m.  General Appearance: Disheveled  Eye Contact:  Good  Speech:  Pressured  Volume:  Increased  Mood:  Irritable  Affect:  Congruent and Labile  Thought Process:  Disorganized  Orientation:  Full  (Time, Place, and Person)  Thought Content:  Illogical, Delusions, Rumination and Tangential  Suicidal Thoughts:  No  Homicidal Thoughts:  No  Memory:  Immediate;   Fair Recent;   Poor Remote;   Poor  Judgement:  Impaired  Insight:  Lacking  Psychomotor Activity:  Restlessness  Concentration:  Concentration: Poor  Recall:  Poor  Fund of Knowledge:  Poor  Language:  Fair  Akathisia:  Negative  Handed:  Right  AIMS (if indicated):     Assets:  Desire for Improvement Housing Resilience  ADL's:  Impaired  Cognition:  Impaired,  Mild  Sleep:  Number of Hours: 6.75     Treatment Plan Summary: Daily contact with patient to assess and evaluate symptoms and progress in treatment and Medication management  1) Bipolar Affective Disorder, current episode manic with psychotic symptoms- established problem, unstable - Patient grandiose, hyperverbal, tangential, and irritable on exam. Patient continues to exhibit aggression towards staff - Non-emergent forced medication order in place through Feb 21st. If patient refuses oral Depakote 750 mg BID, he will receive Zyprexa 10 mg IM - Will check VPA and RPR in the morning - Continue Seroquel 400 mg QHS - Coninue Lamictal 25 mg daily and titrate biweekly. He has tolerated Lamictal 100 mg daily along with Depakote in the past despite drug-drug interaction. Will titrate slowly given questionable compliance previous to admission    2-4) History of CVA, SVT, HTN- established problems, all stable - Plavix 75 mg daily, metoprolol XL 50 mg daily  5) BPH- established problem, stable - Flomax 0.4 mg daily  03/22/20: Psychiatric exam above reviewed and remains accurate. Assessment and plan above reviewed and updated.       Jesse Sans, MD 03/22/2020, 10:05 AM

## 2020-03-22 NOTE — Plan of Care (Signed)
Patient is hyper verbal with his grandiose delusions.No aggressive behaviors noted this afternoon. Patient verbalized back pain from the Depakote. Patient states " the doctor is going to jail for this high doe of medicine.". Safety maintained with 1:1 sitter.Appetite good.Support and encouragement given.

## 2020-03-22 NOTE — [facility] Group Notes (Signed)
LCSW Group Therapy Note     03/22/2020 2:47 PM     Type of Therapy and Topic:  Group Therapy:  Feelings around Relapse and Recovery     Participation Level:  Did Not Attend     Description of Group:    Patients in this group will discuss emotions they experience before and after a relapse. They will process how experiencing these feelings, or avoidance of experiencing them, relates to having a relapse. Facilitator will guide patients to explore emotions they have related to recovery. Patients will be encouraged to process which emotions are more powerful. They will be guided to discuss the emotional reaction significant others in their lives may have to their relapse or recovery. Patients will be assisted in exploring ways to respond to the emotions of others without this contributing to a relapse.     Therapeutic Goals:  1.    Patient will identify two or more emotions that lead to a relapse for them  2.    Patient will identify two emotions that result when they relapse  3.    Patient will identify two emotions related to recovery  4.    Patient will demonstrate ability to communicate their needs through discussion and/or role plays        Summary of Patient Progress: X          Therapeutic Modalities:   Cognitive Behavioral Therapy  Solution-Focused Therapy  Assertiveness Training  Relapse Prevention Therapy        Lache Dagher, MSW, LCSW-A  03/22/2020 2:47 PM  

## 2020-03-22 NOTE — Progress Notes (Signed)
Recreation Therapy Notes  Date: 03/22/2020  Time: 9:30 am   Location: Craft room     Behavioral response: N/A   Intervention Topic: Leisure    Discussion/Intervention: Patient did not attend group.   Clinical Observations/Feedback:  Patient did not attend group.   Jazzmyn Filion LRT/CTRS         Glendia Olshefski 03/22/2020 10:46 AM 

## 2020-03-22 NOTE — Progress Notes (Signed)
Patient got agitated and threw his breakfast tray when staff gave Depakote.Patient states " I never take that high dose of Depakote in my life.I am getting weak with that." Patient took Depakote when told about the force med order.Patient continues to have the delusion of grandeur.Patient is with 1:1 sitter.

## 2020-03-23 LAB — VALPROIC ACID LEVEL: Valproic Acid Lvl: 84 ug/mL (ref 50.0–100.0)

## 2020-03-23 NOTE — Progress Notes (Addendum)
[facility] MD Progress Note  03/23/2020 12:31 PM Austin Rhodes  MRN:  [id number]   CC: "I'm the Richest man in the world."   Subjective:  70 year old male with bipolar disorder presenting under IVC petition for manic episode with psychosis. Overnight patient did not have any acute events. Patient compliant with medications.   Per staff nursing part, patient was verbally aggressive to staff and threw his plate on the floor. He was making threats. He did receive PRN Geodon and took this well.  Patient is floridly manic and grandiose. 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 Tells me that he is held against his will here. I did tell him that he has a right to contact a lawyer about this and that he is on a legal involuntary commitment. 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 Reports that he slept nine hours last night. Acknowledges having bipolar disorder but not cognizant about the fact that he's in a manic state.  No s/i.   Principal Problem: Bipolar affective disorder, current episode manic with psychotic symptoms (HCC) Diagnosis: Principal Problem:   Bipolar affective disorder, current episode manic with psychotic symptoms (HCC) Active Problems:   HTN (hypertension)   GERD (gastroesophageal reflux disease)   History of CVA (cerebrovascular accident)   BPH (benign prostatic hyperplasia)   SVT (supraventricular tachycardia) (HCC)  Total Time spent with patient: 30 minutes  Past Psychiatric History: See previous  Past Medical History:  Past Medical History:  Diagnosis Date  . Bipolar disorder (HCC)   . Coronary artery disease   . GERD (gastroesophageal reflux disease)   . History of multiple strokes   . PTSD (post-traumatic stress disorder)     Past Surgical History:  Procedure Laterality Date  . CARDIAC CATHETERIZATION     Family History: History reviewed. No pertinent family history. Family Psychiatric   History: Denied Social History:  Social History   Substance and Sexual Activity  Alcohol Use No     Social History   Substance and Sexual Activity  Drug Use Never    Social History   Socioeconomic History  . Marital status: Divorced    Spouse name: Not on file  . Number of children: Not on file  . Years of education: Not on file  . Highest education level: Not on file  Occupational History  . Not on file  Tobacco Use  . Smoking status: Never Smoker  . Smokeless tobacco: Never Used  Vaping Use  . Vaping Use: Never used  Substance and Sexual Activity  . Alcohol use: No  . Drug use: Never  . Sexual activity: Not on file  Other Topics Concern  . Not on file  Social History Narrative  . Not on file   Social Determinants of Health   Financial Resource Strain: Not on file  Food Insecurity: Not on file  Transportation Needs: Not on file  Physical Activity: Not on file  Stress: Not on file  Social Connections: Not on file   Additional Social History:                         Sleep: Fair  Appetite:  Fair  Current Medications: Current Facility-Administered Medications  Medication Dose Route Frequency Provider Last Rate Last Admin  . acetaminophen (TYLENOL) tablet 650 mg  650 mg Oral Q6H PRN Clapacs, Jackquline Denmark, MD   650 mg at 03/22/20  1716  . alum & mag hydroxide-simeth (MAALOX/MYLANTA) 200-200-20 MG/5ML suspension 30 mL  30 mL Oral Q4H PRN Clapacs, John T, MD      . clopidogrel (PLAVIX) tablet 75 mg  75 mg Oral Daily Clapacs, Jackquline Denmark, MD   75 mg at 03/23/20 7062  . divalproex (DEPAKOTE) DR tablet 750 mg  750 mg Oral Q12H Jesse Sans, MD   750 mg at 03/23/20 3762   Or  . OLANZapine (ZYPREXA) injection 10 mg  10 mg Intramuscular Q12H Jesse Sans, MD   10 mg at 03/19/20 2011  . feeding supplement (ENSURE ENLIVE / ENSURE PLUS) liquid 237 mL  237 mL Oral TID BM Jesse Sans, MD   237 mL at 03/22/20 2100  . lamoTRIgine (LAMICTAL) tablet 25 mg  25 mg  Oral Daily Jesse Sans, MD   25 mg at 03/23/20 0829  . magnesium hydroxide (MILK OF MAGNESIA) suspension 30 mL  30 mL Oral Daily PRN Clapacs, John T, MD      . metoprolol succinate (TOPROL-XL) 24 hr tablet 50 mg  50 mg Oral Daily Clapacs, Jackquline Denmark, MD   50 mg at 03/23/20 0821  . multivitamin with minerals tablet 1 tablet  1 tablet Oral Daily Jesse Sans, MD   1 tablet at 03/23/20 [phone]  . OLANZapine (ZYPREXA) tablet 10 mg  10 mg Oral Q6H PRN Jesse Sans, MD   10 mg at 03/22/20 2124  . QUEtiapine (SEROQUEL) tablet 400 mg  400 mg Oral QHS Clapacs, John T, MD   400 mg at 03/22/20 2124  . simethicone (MYLICON) chewable tablet 80 mg  80 mg Oral BID WC Jesse Sans, MD   80 mg at 03/23/20 0829  . tamsulosin (FLOMAX) capsule 0.4 mg  0.4 mg Oral QPC supper Clapacs, John T, MD   0.4 mg at 03/22/20 1651  . ziprasidone (GEODON) injection 20 mg  20 mg Intramuscular Q6H PRN Jesse Sans, MD   20 mg at 03/23/20 1203    Lab Results:  Results for orders placed or performed during the hospital encounter of 03/15/20 (from the past 48 hour(s))  Valproic acid level     Status: None   Collection Time: 03/23/20  7:58 AM  Result Value Ref Range   Valproic Acid Lvl 84 50.0 - 100.0 ug/mL    Comment: Performed at [facility], [address] [place], [place] [address]    Blood Alcohol level:  Lab Results  Component Value Date   [facility] <10 03/13/2020   ETH <10 12/16/2018    Metabolic Disorder Labs: Lab Results  Component Value Date   HGBA1C 5.5 03/15/2020   MPG 111.15 03/15/2020   MPG 114.02 03/13/2020   No results found for: PROLACTIN Lab Results  Component Value Date   CHOL 115 03/15/2020   TRIG 117 03/15/2020   HDL 52 03/15/2020   CHOLHDL 2.2 03/15/2020   VLDL 23 03/15/2020   LDLCALC 40 03/15/2020   LDLCALC 53 03/13/2020    Physical Findings: AIMS:  , ,  ,  ,    CIWA:    COWS:     Musculoskeletal: Strength & Muscle Tone: within normal limits Gait & Station:  normal Patient leans: N/A  Psychiatric Specialty Exam: Physical Exama  Review of Systems  Blood pressure (!) 121/92, pulse 88, temperature (!) 97.5 F (36.4 C), temperature source Oral, resp. rate 18, height 5\' 10"  (1.778 m), weight 71.2 kg, SpO2 96 %.Body mass index  is 22.53 kg/m.  General Appearance: Disheveled  Eye Contact:  Good  Speech:  Pressured  Volume:  Increased  Mood:  Irritable  Affect:  Congruent and Labile  Thought Process:  Disorganized  Orientation:  Full (Time, Place, and Person)  Thought Content:  Illogical, Delusions, Rumination and Tangential  Suicidal Thoughts:  No  Homicidal Thoughts:  No  Memory:  Immediate;   Fair Recent;   Poor Remote;   Poor  Judgement:  Impaired  Insight:  Lacking  Psychomotor Activity:  Restlessness  Concentration:  Concentration: Poor  Recall:  Poor  Fund of Knowledge:  Poor  Language:  Fair  Akathisia:  Negative  Handed:  Right  AIMS (if indicated):     Assets:  Desire for Improvement Housing Resilience  ADL's:  Impaired  Cognition:  Impaired,  Mild  Sleep:  Number of Hours: 7     Treatment Plan Summary: Daily contact with patient to assess and evaluate symptoms and progress in treatment and Medication management  1) Bipolar Affective Disorder, current episode manic with psychotic symptoms- established problem, unstable - Patient grandiose, hyperverbal, tangential, and irritable on exam. Patient continues to exhibit aggression towards staff - Non-emergent forced medication order in place through Feb 21st. If patient refuses oral Depakote 750 mg BID, he will receive Zyprexa 10 mg IM - Will check VPA and RPR in the morning - Continue Seroquel 400 mg QHS - Coninue Lamictal 25 mg daily and titrate biweekly. He has tolerated Lamictal 100 mg daily along with Depakote in the past despite drug-drug interaction. Will titrate slowly given questionable compliance previous to admission    2-4) History of CVA, SVT, HTN-  established problems, all stable - Plavix 75 mg daily, metoprolol XL 50 mg daily  5) BPH- established problem, stable - Flomax 0.4 mg daily  03/22/20: Psychiatric exam above reviewed and remains accurate. Assessment and plan above reviewed and updated.   2/19 Depakote level is 84 RPR is pending   CPT: [id number] Reggie Pile, MD 03/23/2020, 12:31 PM

## 2020-03-23 NOTE — [facility] Group Notes (Signed)
[facility] Group Notes: (Clinical Social Work)   03/23/2020      Type of Therapy:  Group Therapy   Participation Level:  Did Not Attend - was invited individually by Nurse/MHT and chose not to attend.   Susa Simmonds, LCSWA 03/23/2020  4:40 PM

## 2020-03-23 NOTE — Progress Notes (Signed)
Pt awake,watching TV in back dayroom without behavioral issue at this time. Tolerated supper and evening medications well. Remains on 1:1 and monitored as ordered. Assigned MHT staff in attendance at all times.

## 2020-03-23 NOTE — Progress Notes (Signed)
Pt alert, oriented to self and place. Presents with intense eye contact, congruent affect, restless and anxious on interactions. Denies SI, HI, AVh and pain at this time. 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 Support, encouragement and reassurance offered to pt throughout this shift. All medications given as ordered and effects monitored. Safety maintained on 1:1 observation as ordered without incident at this time.  Pt tolerated breakfast and medications well when offered without discomfort. Denies concerns at this time.

## 2020-03-23 NOTE — Progress Notes (Signed)
Pt received PRN Geodon 20 mg IM at 1203 after becoming agitated with verbal outbursts, threw lunch tray on floor. Pt was verbally abusive towards staff "I'm not picking nothing up, you work for me and do whatever the hell I damn tell you to do. Shut up when I'm talking to you". Pt observed asleep, respirations noted and unlabored when reassessed at 1300.  Continued support and encouragement provided to pt throughout this shift. 1:1 observation maintained as ordered with assigned staff in attendance.  Pt remains safe on unit. Continues to be grandeur on interactions.

## 2020-03-23 NOTE — Tx Team (Addendum)
Interdisciplinary Treatment and Diagnostic Plan Update  03/23/2020 Time of Session: 11:00 AM  Austin Rhodes MRN: [id number]  Principal Diagnosis: Bipolar affective disorder, current episode manic with psychotic symptoms ([place])  Secondary Diagnoses: Principal Problem:   Bipolar affective disorder, current episode manic with psychotic symptoms ([place]) Active Problems:   HTN (hypertension)   GERD (gastroesophageal reflux disease)   History of CVA (cerebrovascular accident)   BPH (benign prostatic hyperplasia)   SVT (supraventricular tachycardia) ([place])   Current Medications:  Current Facility-Administered Medications  Medication Dose Route Frequency Provider Last Rate Last Admin  . acetaminophen (TYLENOL) tablet 650 mg  650 mg Oral Q6H PRN Clapacs, Madie Reno, MD   650 mg at 03/22/20 1716  . alum & mag hydroxide-simeth (MAALOX/MYLANTA) 200-200-20 MG/5ML suspension 30 mL  30 mL Oral Q4H PRN Clapacs, John T, MD      . clopidogrel (PLAVIX) tablet 75 mg  75 mg Oral Daily Clapacs, Madie Reno, MD   75 mg at 03/23/20 9741  . divalproex (DEPAKOTE) DR tablet 750 mg  750 mg Oral Q12H Salley Scarlet, MD   750 mg at 03/23/20 6384   Or  . OLANZapine (ZYPREXA) injection 10 mg  10 mg Intramuscular Q12H Salley Scarlet, MD   10 mg at 03/19/20 2011  . feeding supplement (ENSURE ENLIVE / ENSURE PLUS) liquid 237 mL  237 mL Oral TID BM Salley Scarlet, MD   237 mL at 03/22/20 2100  . lamoTRIgine (LAMICTAL) tablet 25 mg  25 mg Oral Daily Salley Scarlet, MD   25 mg at 03/23/20 0829  . magnesium hydroxide (MILK OF MAGNESIA) suspension 30 mL  30 mL Oral Daily PRN Clapacs, John T, MD      . metoprolol succinate (TOPROL-XL) 24 hr tablet 50 mg  50 mg Oral Daily Clapacs, Madie Reno, MD   50 mg at 03/23/20 0821  . multivitamin with minerals tablet 1 tablet  1 tablet Oral Daily Salley Scarlet, MD   1 tablet at 03/23/20 [phone]  . OLANZapine (ZYPREXA) tablet 10 mg  10 mg Oral Q6H PRN Salley Scarlet, MD   10 mg at  03/22/20 2124  . QUEtiapine (SEROQUEL) tablet 400 mg  400 mg Oral QHS Clapacs, John T, MD   400 mg at 03/22/20 2124  . simethicone (MYLICON) chewable tablet 80 mg  80 mg Oral BID WC Salley Scarlet, MD   80 mg at 03/23/20 0829  . tamsulosin (FLOMAX) capsule 0.4 mg  0.4 mg Oral QPC supper Clapacs, John T, MD   0.4 mg at 03/22/20 1651  . ziprasidone (GEODON) injection 20 mg  20 mg Intramuscular Q6H PRN Salley Scarlet, MD   20 mg at 03/21/20 1456   PTA Medications: Medications Prior to Admission  Medication Sig Dispense Refill Last Dose  . acetaminophen (TYLENOL) 325 MG tablet Take 2 tablets (650 mg total) by mouth every 6 (six) hours as needed for mild pain or moderate pain. 30 tablet 0   . clopidogrel (PLAVIX) 75 MG tablet Take 1 tablet (75 mg total) by mouth daily. 30 tablet 1   . divalproex (DEPAKOTE ER) 500 MG 24 hr tablet 500 mg at bedtime.     . divalproex (DEPAKOTE) 500 MG DR tablet Take 1 tablet (500 mg total) by mouth every morning. 30 tablet 1   . divalproex (DEPAKOTE) 500 MG DR tablet Take 2 tablets (1,000 mg total) by mouth every evening. 60 tablet 1   . hydrOXYzine (ATARAX/VISTARIL) 50  MG tablet Take 1 tablet (50 mg total) by mouth 3 (three) times daily as needed for anxiety. 30 tablet 1   . lamoTRIgine (LAMICTAL) 100 MG tablet Take 1 tablet (100 mg total) by mouth daily. 30 tablet 1   . metoprolol succinate (TOPROL-XL) 25 MG 24 hr tablet Take 25 mg by mouth daily.     . metoprolol succinate (TOPROL-XL) 50 MG 24 hr tablet Take 1 tablet (50 mg total) by mouth daily. 30 tablet 1   . QUEtiapine (SEROQUEL XR) 200 MG 24 hr tablet Take 200 mg by mouth at bedtime.     Marland Kitchen QUEtiapine (SEROQUEL) 200 MG tablet Take 5 tablets (1,000 mg total) by mouth at bedtime. 150 tablet 1   . rosuvastatin (CRESTOR) 10 MG tablet Take 1 tablet (10 mg total) by mouth daily. 30 tablet 1   . tamsulosin (FLOMAX) 0.4 MG CAPS capsule Take 1 capsule (0.4 mg total) by mouth daily after supper. 30 capsule 1      Patient Stressors: Financial difficulties Health problems  Patient Strengths: Average or above average intelligence Communication skills General fund of knowledge Supportive family/friends  Treatment Modalities: Medication Management, Group therapy, Case management,  1 to 1 session with clinician, Psychoeducation, Recreational therapy.   Physician Treatment Plan for Primary Diagnosis: Bipolar affective disorder, current episode manic with psychotic symptoms ([place]) Long Term Goal(s): Improvement in symptoms so as ready for discharge Improvement in symptoms so as ready for discharge   Short Term Goals: Ability to identify changes in lifestyle to reduce recurrence of condition will improve Ability to verbalize feelings will improve Ability to disclose and discuss suicidal ideas Ability to demonstrate self-control will improve Ability to identify and develop effective coping behaviors will improve Ability to maintain clinical measurements within normal limits will improve Compliance with prescribed medications will improve Ability to identify changes in lifestyle to reduce recurrence of condition will improve Ability to verbalize feelings will improve Ability to disclose and discuss suicidal ideas Ability to demonstrate self-control will improve Ability to identify and develop effective coping behaviors will improve Ability to maintain clinical measurements within normal limits will improve Compliance with prescribed medications will improve  Medication Management: Evaluate patient's response, side effects, and tolerance of medication regimen.  Therapeutic Interventions: 1 to 1 sessions, Unit Group sessions and Medication administration.  Evaluation of Outcomes: Not Met  Physician Treatment Plan for Secondary Diagnosis: Principal Problem:   Bipolar affective disorder, current episode manic with psychotic symptoms ([place]) Active Problems:   HTN (hypertension)   GERD  (gastroesophageal reflux disease)   History of CVA (cerebrovascular accident)   BPH (benign prostatic hyperplasia)   SVT (supraventricular tachycardia) ([place])  Long Term Goal(s): Improvement in symptoms so as ready for discharge Improvement in symptoms so as ready for discharge   Short Term Goals: Ability to identify changes in lifestyle to reduce recurrence of condition will improve Ability to verbalize feelings will improve Ability to disclose and discuss suicidal ideas Ability to demonstrate self-control will improve Ability to identify and develop effective coping behaviors will improve Ability to maintain clinical measurements within normal limits will improve Compliance with prescribed medications will improve Ability to identify changes in lifestyle to reduce recurrence of condition will improve Ability to verbalize feelings will improve Ability to disclose and discuss suicidal ideas Ability to demonstrate self-control will improve Ability to identify and develop effective coping behaviors will improve Ability to maintain clinical measurements within normal limits will improve Compliance with prescribed medications will improve  Medication Management: Evaluate patient's response, side effects, and tolerance of medication regimen.  Therapeutic Interventions: 1 to 1 sessions, Unit Group sessions and Medication administration.  Evaluation of Outcomes: Not Met   RN Treatment Plan for Primary Diagnosis: Bipolar affective disorder, current episode manic with psychotic symptoms ([place]) Long Term Goal(s): Knowledge of disease and therapeutic regimen to maintain health will improve  Short Term Goals: Ability to verbalize frustration and anger appropriately will improve, Ability to demonstrate self-control, Ability to participate in decision making will improve, Ability to verbalize feelings will improve, Ability to identify and develop effective coping behaviors will improve and  Compliance with prescribed medications will improve  Medication Management: RN will administer medications as ordered by provider, will assess and evaluate patient's response and provide education to patient for prescribed medication. RN will report any adverse and/or side effects to prescribing provider.  Therapeutic Interventions: 1 on 1 counseling sessions, Psychoeducation, Medication administration, Evaluate responses to treatment, Monitor vital signs and CBGs as ordered, Perform/monitor CIWA, COWS, AIMS and Fall Risk screenings as ordered, Perform wound care treatments as ordered.  Evaluation of Outcomes: Not Met   LCSW Treatment Plan for Primary Diagnosis: Bipolar affective disorder, current episode manic with psychotic symptoms ([place]) Long Term Goal(s): Safe transition to appropriate next level of care at discharge, Engage patient in therapeutic group addressing interpersonal concerns.  Short Term Goals: Engage patient in aftercare planning with referrals and resources, Increase social support, Increase ability to appropriately verbalize feelings, Increase emotional regulation, Facilitate acceptance of mental health diagnosis and concerns, Identify triggers associated with mental health/substance abuse issues and Increase skills for wellness and recovery  Therapeutic Interventions: Assess for all discharge needs, 1 to 1 time with Social worker, Explore available resources and support systems, Assess for adequacy in community support network, Educate family and significant other(s) on suicide prevention, Complete Psychosocial Assessment, Interpersonal group therapy.  Evaluation of Outcomes: Not Met   Progress in Treatment: Attending groups: No. Participating in groups: No. Taking medication as prescribed: Yes. and No. Toleration medication: Yes. Family/Significant other contact made: Yes, individual(s) contacted:  Pauline Good, Daughter Patient understands diagnosis: No. Discussing  patient identified problems/goals with staff: No. Medical problems stabilized or resolved: Yes. Denies suicidal/homicidal ideation: Yes. Issues/concerns per patient self-inventory: No. Other: None   New problem(s) identified: No, Describe:  None  New Short Term/Long Term Goal(s): Elimination of symptoms of psychosis, medication management for mood stabilization; development of comprehensive mental wellness plan. Update: 03/23/2020, No changes at this time   Patient Goals: "Go get my discharge papers" Update: 03/23/2020, No changes at this time    Discharge Plan or Barriers: CSW will continue to work with patient to develop a safe discharge plan. Update: 03/23/2020, No changes at this time    Reason for Continuation of Hospitalization: Anxiety Delusions  Medication stabilization  Estimated Length of Stay: TBD  Attendees: Patient: 03/23/2020 11:04 AM  Physician: Dr. Daneil Dolin, MD  03/23/2020 11:04 AM  Nursing:  03/23/2020 11:04 AM  RN Care Manager: 03/23/2020 11:04 AM  Social Worker: Raina Mina, [place] 03/23/2020 11:04 AM  Recreational Therapist:  03/23/2020 11:04 AM  Other:  03/23/2020 11:04 AM  Other:  03/23/2020 11:04 AM  Other: 03/23/2020 11:04 AM    Scribe for Treatment Team: Raina Mina, [place] 03/23/2020 11:04 AM

## 2020-03-23 NOTE — Progress Notes (Signed)
Patient alert and oriented x 2 with periods of confusion to situation and place , his thoughts are disorganized and incoherent, he appears responding to internal stimuli, he is hyper verbal , speech is tangential. Patient continues to be on 1: 1 while awake , tech is within close proximity. Patient was complaint with medication regimen, 15 minutes safety checks maintained will continue to monitor.

## 2020-03-24 LAB — RPR: RPR Ser Ql: NONREACTIVE

## 2020-03-24 NOTE — Plan of Care (Signed)
  Problem: Safety: Goal: Violent Restraint(s) Outcome: Progressing   Problem: Education: Goal: Mental status will improve Outcome: Not Progressing Goal: Verbalization of understanding the information provided will improve Outcome: Not Progressing   Problem: Coping: Goal: Ability to verbalize frustrations and anger appropriately will improve Outcome: Not Progressing Goal: Ability to demonstrate self-control will improve Outcome: Not Progressing   Problem: Health Behavior/Discharge Planning: Goal: Compliance with treatment plan for underlying cause of condition will improve Outcome: Progressing   Problem: Safety: Goal: Periods of time without injury will increase Outcome: Progressing

## 2020-03-24 NOTE — Progress Notes (Signed)
Pt has been on a 1:1 with staff while awake per orders. Pt is alert and oriented to person, and place, but not to situation. Pt continues to have grandiose delusions that he is a "trillionaire," or "taking ownership of this hospital," and that he is a doctor and can write prescriptions. Pt attempted to initially refuse his Depakote, but with a great deal of encouragement pt agreed to take it. Pt became agitated insisted on pulling the fire alarm and demanding to discharge, posturing in a threatening manner. Pt was given Zyprexa PO PRN with fair effect. Pt later complained of a headache and was given PRN Tylenol. Pt is hyper-talkative, hyperactive, intrusive, demanding, impulsive, tangential, not open to reality orientation. Pt's appetite is good. Will continue to monitor pt per Q15 minute face checks and monitor for safety and progress.

## 2020-03-24 NOTE — Progress Notes (Signed)
Patient remains in bed sleeping, eyes closed in no distress.

## 2020-03-24 NOTE — [facility] Group Notes (Signed)
[facility] Group Notes:  (Nursing/MHT/Case Management/Adjunct)  Date:  03/24/2020  Time:  9:10 PM  Type of Therapy:  Group Therapy  Participation Level:  Did Not Attend  Participation Quality:  pt. on back hall.   Summary of Progress/Problems:  Austin Rhodes 03/24/2020, 9:10 PM

## 2020-03-24 NOTE — [facility] Group Notes (Signed)
[facility] Group Notes: (Clinical Social Work)   03/24/2020      Type of Therapy:  Group Therapy   Participation Level:  Did Not Attend despite MHT prompting   Alantra Popoca N Kasidee Voisin, LCSW  03/24/2020 2:44 PM   

## 2020-03-24 NOTE — Progress Notes (Signed)
Patient currently in bed resting eyes closed. Medications given as prescribed. Pt continued to be grandiose and hyper verbal. Continued to request for writer to contact patient daughter he is ready to go home. Medication compliant. Did not ask if Depakote was in cup. Encouragement and support provided. Safety checks maintained. Medications given as prescribed, pt receptive and remains safe on unit with q 15 min checks.

## 2020-03-24 NOTE — Progress Notes (Signed)
Patient noted in dayroom on back hall, remains with sitter 1:1 for safety. Watching television. Continues to be grandiose. No verbal outbursts thus far.  Encouragement and support provided. Safety checks maintained. Medications given as prescribed. Pt receptive and remains safe on unit with q 15 min checks.

## 2020-03-24 NOTE — Progress Notes (Signed)
[facility] MD Progress Note  03/24/2020 10:33 AM Austin Rhodes  MRN:  [id number]   CC: "I'm the Richest man in the world."   Subjective:  70 year old male with bipolar disorder presenting under IVC petition for manic episode with psychosis. Overnight patient did not have any acute events. Patient compliant with medications.   03/24/20 Patient continues to speak grandiosely. After carefully inspecting the firm alarm near his room, he insists that we now need a fire-drill.. 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 He plan to change the name of [facility] to [facility]. 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 Says that he's "a multi trillionaire." Believes that he slept 10 hours last night. Taking his meds as prescribed. Staff nursing reports that he is last agitated and he was middle to end of last week. Denies depression. Denies psychosis.   03/23/20 Per staff nursing part, patient was verbally aggressive to staff and threw his plate on the floor. He was making threats. He did receive PRN Geodon and took this well.  Patient is floridly manic and grandiose. 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 Tells me that he is held against his will here. I did tell him that he has a right to contact a lawyer about this and that he is on a legal involuntary commitment. 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 Reports that he slept nine hours last night. Acknowledges having bipolar disorder but not cognizant about the fact that he's in a manic state.  No s/i.   Principal Problem: Bipolar affective disorder, current episode manic with psychotic symptoms (HCC) Diagnosis: Principal Problem:   Bipolar affective disorder, current episode manic with psychotic symptoms (HCC) Active Problems:   HTN (hypertension)   GERD  (gastroesophageal reflux disease)   History of CVA (cerebrovascular accident)   BPH (benign prostatic hyperplasia)   SVT (supraventricular tachycardia) (HCC)  Total Time spent with patient: 30 minutes  Past Psychiatric History: See previous  Past Medical History:  Past Medical History:  Diagnosis Date  . Bipolar disorder (HCC)   . Coronary artery disease   . GERD (gastroesophageal reflux disease)   . History of multiple strokes   . PTSD (post-traumatic stress disorder)     Past Surgical History:  Procedure Laterality Date  . CARDIAC CATHETERIZATION     Family History: History reviewed. No pertinent family history. Family Psychiatric  History: Denied Social History:  Social History   Substance and Sexual Activity  Alcohol Use No     Social History   Substance and Sexual Activity  Drug Use Never    Social History   Socioeconomic History  . Marital status: Divorced    Spouse name: Not on file  . Number of children: Not on file  . Years of education: Not on file  . Highest education level: Not on file  Occupational History  . Not on file  Tobacco Use  . Smoking status: Never Smoker  . Smokeless tobacco: Never Used  Vaping Use  . Vaping Use: Never used  Substance and Sexual Activity  . Alcohol use: No  . Drug use: Never  . Sexual activity: Not on file  Other Topics Concern  . Not on file  Social History Narrative  . Not on file  Social Determinants of Health   Financial Resource Strain: Not on file  Food Insecurity: Not on file  Transportation Needs: Not on file  Physical Activity: Not on file  Stress: Not on file  Social Connections: Not on file   Additional Social History:                         Sleep: Fair  Appetite:  Fair  Current Medications: Current Facility-Administered Medications  Medication Dose Route Frequency Provider Last Rate Last Admin  . acetaminophen (TYLENOL) tablet 650 mg  650 mg Oral Q6H PRN Clapacs, Jackquline Denmark, MD    650 mg at 03/22/20 1716  . alum & mag hydroxide-simeth (MAALOX/MYLANTA) 200-200-20 MG/5ML suspension 30 mL  30 mL Oral Q4H PRN Clapacs, John T, MD      . clopidogrel (PLAVIX) tablet 75 mg  75 mg Oral Daily Clapacs, Jackquline Denmark, MD   75 mg at 03/24/20 0807  . divalproex (DEPAKOTE) DR tablet 750 mg  750 mg Oral Q12H Jesse Sans, MD   750 mg at 03/24/20 5462   Or  . OLANZapine (ZYPREXA) injection 10 mg  10 mg Intramuscular Q12H Jesse Sans, MD   10 mg at 03/19/20 2011  . feeding supplement (ENSURE ENLIVE / ENSURE PLUS) liquid 237 mL  237 mL Oral TID BM Jesse Sans, MD   237 mL at 03/23/20 2107  . lamoTRIgine (LAMICTAL) tablet 25 mg  25 mg Oral Daily Jesse Sans, MD   25 mg at 03/24/20 0804  . magnesium hydroxide (MILK OF MAGNESIA) suspension 30 mL  30 mL Oral Daily PRN Clapacs, John T, MD      . metoprolol succinate (TOPROL-XL) 24 hr tablet 50 mg  50 mg Oral Daily Clapacs, Jackquline Denmark, MD   50 mg at 03/24/20 0808  . multivitamin with minerals tablet 1 tablet  1 tablet Oral Daily Jesse Sans, MD   1 tablet at 03/24/20 0804  . OLANZapine (ZYPREXA) tablet 10 mg  10 mg Oral Q6H PRN Jesse Sans, MD   10 mg at 03/23/20 2112  . QUEtiapine (SEROQUEL) tablet 400 mg  400 mg Oral QHS Clapacs, John T, MD   400 mg at 03/23/20 2107  . simethicone (MYLICON) chewable tablet 80 mg  80 mg Oral BID WC Jesse Sans, MD   80 mg at 03/24/20 0804  . tamsulosin (FLOMAX) capsule 0.4 mg  0.4 mg Oral QPC supper Clapacs, John T, MD   0.4 mg at 03/23/20 1721  . ziprasidone (GEODON) injection 20 mg  20 mg Intramuscular Q6H PRN Jesse Sans, MD   20 mg at 03/23/20 1203    Lab Results:  Results for orders placed or performed during the hospital encounter of 03/15/20 (from the past 48 hour(s))  Valproic acid level     Status: None   Collection Time: 03/23/20  7:58 AM  Result Value Ref Range   Valproic Acid Lvl 84 50.0 - 100.0 ug/mL    Comment: Performed at [facility], [address] [place], [place] [address]    Blood Alcohol level:  Lab Results  Component Value Date   [facility] <10 03/13/2020   ETH <10 12/16/2018    Metabolic Disorder Labs: Lab Results  Component Value Date   HGBA1C 5.5 03/15/2020   MPG 111.15 03/15/2020   MPG 114.02 03/13/2020   No results found for: PROLACTIN Lab Results  Component Value Date  CHOL 115 03/15/2020   TRIG 117 03/15/2020   HDL 52 03/15/2020   CHOLHDL 2.2 03/15/2020   VLDL 23 03/15/2020   LDLCALC 40 03/15/2020   LDLCALC 53 03/13/2020    Physical Findings: AIMS:  , ,  ,  ,    CIWA:    COWS:     Musculoskeletal: Strength & Muscle Tone: within normal limits Gait & Station: normal Patient leans: N/A  Psychiatric Specialty Exam: Physical Exama  Review of Systems  Blood pressure 118/84, pulse 88, temperature 98.2 F (36.8 C), temperature source Oral, resp. rate 18, height 5\' 10"  (1.778 m), weight 71.2 kg, SpO2 96 %.Body mass index is 22.53 kg/m.  General Appearance: Disheveled  Eye Contact:  Good  Speech:  pushed  Volume:  wnl  Mood:  Irritable  Affect:  Elevated  Thought Process:  Disorganized  Orientation:  Full (Time, Place, and Person)  Thought Content:  Illogical, Delusions, Rumination and Tangential  Suicidal Thoughts:  No  Homicidal Thoughts:  No  Memory:  Immediate;   Fair Recent;   Poor Remote;   Poor  Judgement:  Impaired  Insight:  Lacking  Psychomotor Activity:  Restlessness  Concentration:  Concentration: Poor  Recall:  Poor  Fund of Knowledge:  Poor  Language:  Fair  Akathisia:  Negative  Handed:  Right  AIMS (if indicated):     Assets:  Desire for Improvement Housing Resilience  ADL's:  Impaired  Cognition:  Impaired,  Mild  Sleep:  Number of Hours: 8     Treatment Plan Summary: Daily contact with patient to assess and evaluate symptoms and progress in treatment and Medication management  1) Bipolar Affective Disorder, current episode manic with psychotic symptoms- established  problem, unstable - Patient grandiose, hyperverbal, tangential, and irritable on exam. Patient continues to exhibit aggression towards staff - Non-emergent forced medication order in place through Feb 21st. If patient refuses oral Depakote 750 mg BID, he will receive Zyprexa 10 mg IM - Will check VPA and RPR in the morning - Continue Seroquel 400 mg QHS - Coninue Lamictal 25 mg daily and titrate biweekly. He has tolerated Lamictal 100 mg daily along with Depakote in the past despite drug-drug interaction. Will titrate slowly given questionable compliance previous to admission    2-4) History of CVA, SVT, HTN- established problems, all stable - Plavix 75 mg daily, metoprolol XL 50 mg daily  5) BPH- established problem, stable - Flomax 0.4 mg daily  03/22/20: Psychiatric exam above reviewed and remains accurate. Assessment and plan above reviewed and updated.   2/19 Depakote level is 84 RPR is pending  2/20 Continue 1:1  No rashes reported on lamictal No changes     CPT: 3/20 16073, MD 03/24/2020, 10:33 AM

## 2020-03-25 NOTE — Progress Notes (Signed)
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 No pain verbalized at this time. Appetite fair. Support and encouragement given. Safety maintained with 1: 1 sitter.

## 2020-03-25 NOTE — Progress Notes (Signed)
Recreation Therapy Notes   Date: 03/25/2020  Time: 9:30 am   Location: Craft room     Behavioral response: N/A   Intervention Topic: Time Management   Discussion/Intervention: Patient did not attend group.   Clinical Observations/Feedback:  Patient did not attend group.   Lala Been LRT/CTRS        Austin Rhodes 03/25/2020 12:08 PM 

## 2020-03-25 NOTE — Progress Notes (Signed)
Patient remains with 1:1 sitter this evening. He is alert and oriented at this encounter. He denies SI  HI  AVH depression anxiety and pain.  He initially tried to refuse his oral QHS medication, but complied,  after being reminded about the option to give injection if he refused. 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  He reports only taking 250 mg of Depakote outside of the hospital and does not agree or understand why he has to take the current dose ordered.  He reports and wants it noted that the "Depakote causes my legs to cramps and my toes to curls up among things ... and I think I'm allergic." He was advised to mention to the doctor at the their next encounter all of the side effects he believes Depakote causes him.  For now he remains safe on the unit with 15 minute safety checks. He was provided food and drink and remains with 1:1 sitter while he is awake.     Cleo Butler-Nicholson, LPN

## 2020-03-25 NOTE — Progress Notes (Signed)
Patient tried to refuse 750 mg of Depakote stated that he is " allergic to Depakote and having leg cramps." Patient refused Tylenol at this time. Patient continues to have grandiose delusions states " I can put you all in jail for forcing me to take a high dose of medicine. 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 Safety maintained with 1: 1 sitter.

## 2020-03-25 NOTE — Progress Notes (Signed)
[facility] MD Progress Note  03/25/2020 1:36 PM Austin Rhodes  MRN:  [id number]   CC: "You are going to jail"   Subjective:  70 year old male with bipolar disorder presenting under IVC petition for manic episode with psychosis. Overnight patient did not have any acute events. Patient compliant with medications once reminded on non-emergent forced medication order.    Patient seen one-on-one again today. Patient extremely irritable again this morning. He continues to state I am going to jail for holding him against his will and giving him a medication he is allergic to. On attempts to speak he curses at me and tells me to listen. He volunteers he has been on Depakote since 2002, but typically only takes 250 mg BID. On attempts to discuss his lack of allergic reaction, or current levels he becomes increasingly irate. He continues to request discharge. When explaining that he will need to remain calm, and show [place] he can step down to main unit prior to discharge he again begins cursing and posturing aggressively. Interview terminated at that time. He remains on 1:1 for safety. Patient has a severe mental illness that is unlikely to improve without medications. He is currently endangering himself and staff and peers on the unit. For these reasons, I feel he would benefit from continued non-emergent forced medications. Will consult Dr. Toni Amend for second opinion on need to continue NEFM after today.    Principal Problem: Bipolar affective disorder, current episode manic with psychotic symptoms (HCC) Diagnosis: Principal Problem:   Bipolar affective disorder, current episode manic with psychotic symptoms (HCC) Active Problems:   HTN (hypertension)   GERD (gastroesophageal reflux disease)   History of CVA (cerebrovascular accident)   BPH (benign prostatic hyperplasia)   SVT (supraventricular tachycardia) (HCC)  Total Time spent with patient: 30 minutes  Past Psychiatric History: See previous  Past  Medical History:  Past Medical History:  Diagnosis Date  . Bipolar disorder (HCC)   . Coronary artery disease   . GERD (gastroesophageal reflux disease)   . History of multiple strokes   . PTSD (post-traumatic stress disorder)     Past Surgical History:  Procedure Laterality Date  . CARDIAC CATHETERIZATION     Family History: History reviewed. No pertinent family history. Family Psychiatric  History: Denied Social History:  Social History   Substance and Sexual Activity  Alcohol Use No     Social History   Substance and Sexual Activity  Drug Use Never    Social History   Socioeconomic History  . Marital status: Divorced    Spouse name: Not on file  . Number of children: Not on file  . Years of education: Not on file  . Highest education level: Not on file  Occupational History  . Not on file  Tobacco Use  . Smoking status: Never Smoker  . Smokeless tobacco: Never Used  Vaping Use  . Vaping Use: Never used  Substance and Sexual Activity  . Alcohol use: No  . Drug use: Never  . Sexual activity: Not on file  Other Topics Concern  . Not on file  Social History Narrative  . Not on file   Social Determinants of Health   Financial Resource Strain: Not on file  Food Insecurity: Not on file  Transportation Needs: Not on file  Physical Activity: Not on file  Stress: Not on file  Social Connections: Not on file   Additional Social History:  Sleep: Fair  Appetite:  Fair  Current Medications: Current Facility-Administered Medications  Medication Dose Route Frequency Provider Last Rate Last Admin  . acetaminophen (TYLENOL) tablet 650 mg  650 mg Oral Q6H PRN Clapacs, Jackquline Denmark, MD   650 mg at 03/24/20 1651  . alum & mag hydroxide-simeth (MAALOX/MYLANTA) 200-200-20 MG/5ML suspension 30 mL  30 mL Oral Q4H PRN Clapacs, John T, MD      . clopidogrel (PLAVIX) tablet 75 mg  75 mg Oral Daily Clapacs, Jackquline Denmark, MD   75 mg at 03/25/20 0932   . divalproex (DEPAKOTE) DR tablet 750 mg  750 mg Oral Q12H Jesse Sans, MD   750 mg at 03/25/20 0930   Or  . OLANZapine (ZYPREXA) injection 10 mg  10 mg Intramuscular Q12H Jesse Sans, MD   10 mg at 03/19/20 2011  . feeding supplement (ENSURE ENLIVE / ENSURE PLUS) liquid 237 mL  237 mL Oral TID BM Jesse Sans, MD   237 mL at 03/24/20 2338  . lamoTRIgine (LAMICTAL) tablet 25 mg  25 mg Oral Daily Jesse Sans, MD   25 mg at 03/25/20 0931  . magnesium hydroxide (MILK OF MAGNESIA) suspension 30 mL  30 mL Oral Daily PRN Clapacs, Jackquline Denmark, MD   30 mL at 03/25/20 1313  . metoprolol succinate (TOPROL-XL) 24 hr tablet 50 mg  50 mg Oral Daily Clapacs, Jackquline Denmark, MD   50 mg at 03/25/20 0931  . multivitamin with minerals tablet 1 tablet  1 tablet Oral Daily Jesse Sans, MD   1 tablet at 03/25/20 [phone]  . OLANZapine (ZYPREXA) tablet 10 mg  10 mg Oral Q6H PRN Jesse Sans, MD   10 mg at 03/24/20 1440  . QUEtiapine (SEROQUEL) tablet 400 mg  400 mg Oral QHS Clapacs, Jackquline Denmark, MD   400 mg at 03/24/20 2338  . simethicone (MYLICON) chewable tablet 80 mg  80 mg Oral BID WC Jesse Sans, MD   80 mg at 03/25/20 0930  . tamsulosin (FLOMAX) capsule 0.4 mg  0.4 mg Oral QPC supper Clapacs, John T, MD   0.4 mg at 03/24/20 1650  . ziprasidone (GEODON) injection 20 mg  20 mg Intramuscular Q6H PRN Jesse Sans, MD   20 mg at 03/23/20 1203    Lab Results: No results found for this or any previous visit (from the past 48 hour(s)).  Blood Alcohol level:  Lab Results  Component Value Date   ETH <10 03/13/2020   ETH <10 12/16/2018    Metabolic Disorder Labs: Lab Results  Component Value Date   HGBA1C 5.5 03/15/2020   MPG 111.15 03/15/2020   MPG 114.02 03/13/2020   No results found for: PROLACTIN Lab Results  Component Value Date   CHOL 115 03/15/2020   TRIG 117 03/15/2020   HDL 52 03/15/2020   CHOLHDL 2.2 03/15/2020   VLDL 23 03/15/2020   LDLCALC 40 03/15/2020   LDLCALC 53  03/13/2020    Physical Findings: AIMS:  , ,  ,  ,    CIWA:    COWS:     Musculoskeletal: Strength & Muscle Tone: within normal limits Gait & Station: normal Patient leans: N/A  Psychiatric Specialty Exam: Physical Exam a  Review of Systems   Blood pressure 134/78, pulse 80, temperature 97.7 F (36.5 C), temperature source Oral, resp. rate 18, height 5\' 10"  (1.778 m), weight 71.2 kg, SpO2 98 %.Body mass index is 22.53 kg/m.  General Appearance: Disheveled  Eye Contact:  Glaring  Speech:  Pressured  Volume:  Increased  Mood:  Irritable  Affect:  Congruent and Labile  Thought Process:  Disorganized  Orientation:  Full (Time, Place, and Person)  Thought Content:  Illogical, Delusions, Rumination and Tangential  Suicidal Thoughts:  No  Homicidal Thoughts:  No  Memory:  Immediate;   Fair Recent;   Poor Remote;   Poor  Judgement:  Impaired  Insight:  Lacking  Psychomotor Activity:  Restlessness  Concentration:  Concentration: Poor  Recall:  Poor  Fund of Knowledge:  Poor  Language:  Fair  Akathisia:  Negative  Handed:  Right  AIMS (if indicated):     Assets:  Desire for Improvement Housing Resilience  ADL's:  Impaired  Cognition:  Impaired,  Mild  Sleep:  Number of Hours: 7.75     Treatment Plan Summary: Daily contact with patient to assess and evaluate symptoms and progress in treatment and Medication management  1) Bipolar Affective Disorder, current episode manic with psychotic symptoms- established problem, unstable - Patient grandiose, hyperverbal, tangential, and irritable on exam. Patient continues to exhibit aggression towards staff - Non-emergent forced medication order in place through today. If patient refuses oral Depakote 750 mg BID, he will receive Zyprexa 10 mg IM - VPA level 84, RPR negative - Continue Seroquel 400 mg QHS - Coninue Lamictal 25 mg daily and titrate biweekly. He has tolerated Lamictal 100 mg daily along with Depakote in the past  despite drug-drug interaction. Will titrate slowly given questionable compliance previous to admission. No rashes on exam.    2-4) History of CVA, SVT, HTN- established problems, all stable - Plavix 75 mg daily, metoprolol XL 50 mg daily  5) BPH- established problem, stable - Flomax 0.4 mg daily  03/25/20: Psychiatric exam above reviewed and remains accurate. Assessment and plan above reviewed and updated.       Jesse Sans, MD 03/25/2020, 1:36 PM

## 2020-03-25 NOTE — Plan of Care (Signed)
  Problem: Safety: Goal: Violent Restraint(s) Outcome: Progressing   Problem: Education: Goal: Knowledge of [facility] Education information/materials will improve Outcome: Progressing Goal: Emotional status will improve Outcome: Progressing Goal: Mental status will improve Outcome: Progressing Goal: Verbalization of understanding the information provided will improve Outcome: Progressing   Problem: Activity: Goal: Interest or engagement in activities will improve Outcome: Progressing Goal: Sleeping patterns will improve Outcome: Progressing   Problem: Coping: Goal: Ability to verbalize frustrations and anger appropriately will improve Outcome: Progressing Goal: Ability to demonstrate self-control will improve Outcome: Progressing   Problem: Health Behavior/Discharge Planning: Goal: Identification of resources available to assist in meeting health care needs will improve Outcome: Progressing Goal: Compliance with treatment plan for underlying cause of condition will improve Outcome: Progressing   Problem: Physical Regulation: Goal: Ability to maintain clinical measurements within normal limits will improve Outcome: Progressing   Problem: Safety: Goal: Periods of time without injury will increase Outcome: Progressing   Problem: Activity: Goal: Will verbalize the importance of balancing activity with adequate rest periods Outcome: Progressing   Problem: Education: Goal: Will be free of psychotic symptoms Outcome: Progressing Goal: Knowledge of the prescribed therapeutic regimen will improve Outcome: Progressing   Problem: Coping: Goal: Coping ability will improve Outcome: Progressing Goal: Will verbalize feelings Outcome: Progressing   Problem: Health Behavior/Discharge Planning: Goal: Compliance with prescribed medication regimen will improve Outcome: Progressing   Problem: Nutritional: Goal: Ability to achieve adequate nutritional intake will  improve Outcome: Progressing   Problem: Role Relationship: Goal: Ability to communicate needs accurately will improve Outcome: Progressing Goal: Ability to interact with others will improve Outcome: Progressing   Problem: Safety: Goal: Ability to redirect hostility and anger into socially appropriate behaviors will improve Outcome: Progressing Goal: Ability to remain free from injury will improve Outcome: Progressing   Problem: Self-Care: Goal: Ability to participate in self-care as condition permits will improve Outcome: Progressing   Problem: Self-Concept: Goal: Will verbalize positive feelings about self Outcome: Progressing

## 2020-03-25 NOTE — Plan of Care (Signed)
  Problem: Safety: Goal: Violent Restraint(s) Outcome: Progressing   Problem: Education: Goal: Knowledge of [facility] Education information/materials will improve Outcome: Progressing Goal: Emotional status will improve Outcome: Progressing Goal: Mental status will improve Outcome: Progressing Goal: Verbalization of understanding the information provided will improve Outcome: Progressing   Problem: Activity: Goal: Interest or engagement in activities will improve Outcome: Progressing Goal: Sleeping patterns will improve Outcome: Progressing   Problem: Coping: Goal: Ability to verbalize frustrations and anger appropriately will improve Outcome: Progressing Goal: Ability to demonstrate self-control will improve Outcome: Progressing   Problem: Health Behavior/Discharge Planning: Goal: Identification of resources available to assist in meeting health care needs will improve Outcome: Progressing Goal: Compliance with treatment plan for underlying cause of condition will improve Outcome: Progressing   Problem: Physical Regulation: Goal: Ability to maintain clinical measurements within normal limits will improve Outcome: Progressing   Problem: Safety: Goal: Periods of time without injury will increase Outcome: Progressing   Problem: Activity: Goal: Will verbalize the importance of balancing activity with adequate rest periods Outcome: Progressing   Problem: Education: Goal: Will be free of psychotic symptoms Outcome: Progressing Goal: Knowledge of the prescribed therapeutic regimen will improve Outcome: Progressing   Problem: Coping: Goal: Coping ability will improve Outcome: Progressing Goal: Will verbalize feelings Outcome: Progressing   Problem: Health Behavior/Discharge Planning: Goal: Compliance with prescribed medication regimen will improve Outcome: Progressing   Problem: Nutritional: Goal: Ability to achieve adequate nutritional intake will  improve Outcome: Progressing   Problem: Role Relationship: Goal: Ability to communicate needs accurately will improve Outcome: Progressing Goal: Ability to interact with others will improve Outcome: Progressing   Problem: Safety: Goal: Ability to redirect hostility and anger into socially appropriate behaviors will improve Outcome: Progressing Goal: Ability to remain free from injury will improve Outcome: Progressing   Problem: Self-Care: Goal: Ability to participate in self-care as condition permits will improve Outcome: Progressing   Problem: Self-Concept: Goal: Will verbalize positive feelings about self Outcome: Progressing   

## 2020-03-25 NOTE — [facility] Group Notes (Signed)
LCSW Group Therapy Note   03/25/2020 1:19 PM  Type of Therapy and Topic:  Group Therapy:  Overcoming Obstacles   Participation Level:  Did Not Attend   Description of Group:    In this group patients will be encouraged to explore what they see as obstacles to their own wellness and recovery. They will be guided to discuss their thoughts, feelings, and behaviors related to these obstacles. The group will process together ways to cope with barriers, with attention given to specific choices patients can make. Each patient will be challenged to identify changes they are motivated to make in order to overcome their obstacles. This group will be process-oriented, with patients participating in exploration of their own experiences as well as giving and receiving support and challenge from other group members.   Therapeutic Goals: 1. Patient will identify personal and current obstacles as they relate to admission. 2. Patient will identify barriers that currently interfere with their wellness or overcoming obstacles.  3. Patient will identify feelings, thought process and behaviors related to these barriers. 4. Patient will identify two changes they are willing to make to overcome these obstacles:      Summary of Patient Progress X     Therapeutic Modalities:   Cognitive Behavioral Therapy Solution Focused Therapy Motivational Interviewing Relapse Prevention Therapy  Austin Rhodes R. Algis Greenhouse, MSW, LCSW, LCAS 03/25/2020 1:19 PM

## 2020-03-25 NOTE — Progress Notes (Signed)
Patient remains asleep at this time. He remains on 1:1 with a sitter and will continue to be monitored for safety and change in status.     Cleo Butler-Nicholson, LPN

## 2020-03-26 LAB — VALPROIC ACID LEVEL: Valproic Acid Lvl: 57 ug/mL (ref 50.0–100.0)

## 2020-03-26 NOTE — Progress Notes (Signed)
Patient continues to be on 1:1 He was pleasant until approached about taking his QHS medication. He was cooperative with taking all of his meds except for the 500mg  Depakote pill which he refused. 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  He denies SI HI AVH depression and anxiety at this encounter.  He is safe with the 1:1 sitter and 15 minute safety rounds.     Cleo Butler-Nicholson, LPN

## 2020-03-26 NOTE — [facility] Group Notes (Signed)
LCSW Group Therapy Note  03/26/2020 2:38 PM  Type of Therapy/Topic:  Group Therapy:  Feelings about Diagnosis  Participation Level:  Did Not Attend   Description of Group:   This group will allow patients to explore their thoughts and feelings about diagnoses they have received. Patients will be guided to explore their level of understanding and acceptance of these diagnoses. Facilitator will encourage patients to process their thoughts and feelings about the reactions of others to their diagnosis and will guide patients in identifying ways to discuss their diagnosis with significant others in their lives. This group will be process-oriented, with patients participating in exploration of their own experiences, giving and receiving support, and processing challenge from other group members.   Therapeutic Goals: 1. Patient will demonstrate understanding of diagnosis as evidenced by identifying two or more symptoms of the disorder 2. Patient will be able to express two feelings regarding the diagnosis 3. Patient will demonstrate their ability to communicate their needs through discussion and/or role play  Summary of Patient Progress: Patient unable to attend due to being on back hall.    Therapeutic Modalities:   Cognitive Behavioral Therapy Brief Therapy Feelings Identification   Penni Homans, MSW, LCSW 03/26/2020 2:38 PM

## 2020-03-26 NOTE — Progress Notes (Signed)
[facility] MD Progress Note  03/26/2020 1:43 PM Austin Rhodes  MRN:  [id number]   CC: "When can I leave?l"   Subjective:  70 year old male with bipolar disorder presenting under IVC petition for manic episode with psychosis. Overnight patient did not have any acute events. Patient compliant with medications once reminded on non-emergent forced medication order.    Patient seen one-on-one again today. He feels that he is on too much Depakote, and feels tired today. Last level 86, but will recheck again tonight as he has been taking more consistently. He continues to have grandiose delusions, but is overall more pleasant today. He has not been cursing at staff, banging on doors, or otherwise aggressive. We have discontinued 1:1, and will continue to monitor. He appears to be approaching his hypomanic baseline.   Principal Problem: Bipolar affective disorder, current episode manic with psychotic symptoms (HCC) Diagnosis: Principal Problem:   Bipolar affective disorder, current episode manic with psychotic symptoms (HCC) Active Problems:   HTN (hypertension)   GERD (gastroesophageal reflux disease)   History of CVA (cerebrovascular accident)   BPH (benign prostatic hyperplasia)   SVT (supraventricular tachycardia) (HCC)  Total Time spent with patient: 30 minutes  Past Psychiatric History: See previous  Past Medical History:  Past Medical History:  Diagnosis Date  . Bipolar disorder (HCC)   . Coronary artery disease   . GERD (gastroesophageal reflux disease)   . History of multiple strokes   . PTSD (post-traumatic stress disorder)     Past Surgical History:  Procedure Laterality Date  . CARDIAC CATHETERIZATION     Family History: History reviewed. No pertinent family history. Family Psychiatric  History: Denied Social History:  Social History   Substance and Sexual Activity  Alcohol Use No     Social History   Substance and Sexual Activity  Drug Use Never    Social  History   Socioeconomic History  . Marital status: Divorced    Spouse name: Not on file  . Number of children: Not on file  . Years of education: Not on file  . Highest education level: Not on file  Occupational History  . Not on file  Tobacco Use  . Smoking status: Never Smoker  . Smokeless tobacco: Never Used  Vaping Use  . Vaping Use: Never used  Substance and Sexual Activity  . Alcohol use: No  . Drug use: Never  . Sexual activity: Not on file  Other Topics Concern  . Not on file  Social History Narrative  . Not on file   Social Determinants of Health   Financial Resource Strain: Not on file  Food Insecurity: Not on file  Transportation Needs: Not on file  Physical Activity: Not on file  Stress: Not on file  Social Connections: Not on file   Additional Social History:                         Sleep: Fair  Appetite:  Fair  Current Medications: Current Facility-Administered Medications  Medication Dose Route Frequency Provider Last Rate Last Admin  . acetaminophen (TYLENOL) tablet 650 mg  650 mg Oral Q6H PRN Clapacs, Jackquline Denmark, MD   650 mg at 03/24/20 1651  . alum & mag hydroxide-simeth (MAALOX/MYLANTA) 200-200-20 MG/5ML suspension 30 mL  30 mL Oral Q4H PRN Clapacs, John T, MD      . clopidogrel (PLAVIX) tablet 75 mg  75 mg Oral Daily Clapacs, Jackquline Denmark, MD   75 mg  at 03/26/20 0836  . divalproex (DEPAKOTE) DR tablet 750 mg  750 mg Oral Q12H Jesse Sans, MD   750 mg at 03/26/20 8466   Or  . OLANZapine (ZYPREXA) injection 10 mg  10 mg Intramuscular Q12H Jesse Sans, MD   10 mg at 03/19/20 2011  . feeding supplement (ENSURE ENLIVE / ENSURE PLUS) liquid 237 mL  237 mL Oral TID BM Jesse Sans, MD   237 mL at 03/25/20 2147  . lamoTRIgine (LAMICTAL) tablet 25 mg  25 mg Oral Daily Jesse Sans, MD   25 mg at 03/26/20 5993  . magnesium hydroxide (MILK OF MAGNESIA) suspension 30 mL  30 mL Oral Daily PRN Clapacs, Jackquline Denmark, MD   30 mL at 03/25/20 1313  .  metoprolol succinate (TOPROL-XL) 24 hr tablet 50 mg  50 mg Oral Daily Clapacs, Jackquline Denmark, MD   50 mg at 03/26/20 0835  . multivitamin with minerals tablet 1 tablet  1 tablet Oral Daily Jesse Sans, MD   1 tablet at 03/26/20 [phone]  . OLANZapine (ZYPREXA) tablet 10 mg  10 mg Oral Q6H PRN Jesse Sans, MD   10 mg at 03/25/20 2146  . QUEtiapine (SEROQUEL) tablet 400 mg  400 mg Oral QHS Clapacs, Jackquline Denmark, MD   400 mg at 03/25/20 2145  . simethicone (MYLICON) chewable tablet 80 mg  80 mg Oral BID WC Jesse Sans, MD   80 mg at 03/26/20 7793  . tamsulosin (FLOMAX) capsule 0.4 mg  0.4 mg Oral QPC supper Clapacs, Jackquline Denmark, MD   0.4 mg at 03/25/20 1709  . ziprasidone (GEODON) injection 20 mg  20 mg Intramuscular Q6H PRN Jesse Sans, MD   20 mg at 03/23/20 1203    Lab Results: No results found for this or any previous visit (from the past 48 hour(s)).  Blood Alcohol level:  Lab Results  Component Value Date   ETH <10 03/13/2020   ETH <10 12/16/2018    Metabolic Disorder Labs: Lab Results  Component Value Date   HGBA1C 5.5 03/15/2020   MPG 111.15 03/15/2020   MPG 114.02 03/13/2020   No results found for: PROLACTIN Lab Results  Component Value Date   CHOL 115 03/15/2020   TRIG 117 03/15/2020   HDL 52 03/15/2020   CHOLHDL 2.2 03/15/2020   VLDL 23 03/15/2020   LDLCALC 40 03/15/2020   LDLCALC 53 03/13/2020    Physical Findings: AIMS:  , ,  ,  ,    CIWA:    COWS:     Musculoskeletal: Strength & Muscle Tone: within normal limits Gait & Station: normal Patient leans: N/A  Psychiatric Specialty Exam: Physical Exam a  Review of Systems   Blood pressure (!) 148/93, pulse 76, temperature 97.7 F (36.5 C), temperature source Oral, resp. rate 18, height 5\' 10"  (1.778 m), weight 71.2 kg, SpO2 95 %.Body mass index is 22.53 kg/m.  General Appearance: Fairly Groomed  Eye Contact:  Good  Speech:  Normal Rate  Volume:  Normal  Mood:  Irritable  Affect:  Congruent  Thought  Process:  Disorganized  Orientation:  Full (Time, Place, and Person)  Thought Content:  Delusions and Tangential  Suicidal Thoughts:  No  Homicidal Thoughts:  No  Memory:  Immediate;   Fair Recent;   Poor Remote;   Poor  Judgement:  Intact  Insight:  Shallow  Psychomotor Activity:  Normal  Concentration:  Concentration: Fair  Recall:  Poor  Fund of Knowledge:  Fair  Language:  Fair  Akathisia:  Negative  Handed:  Right  AIMS (if indicated):     Assets:  Desire for Improvement Housing Resilience  ADL's:  Impaired  Cognition:  Impaired,  Mild  Sleep:  Number of Hours: 7     Treatment Plan Summary: Daily contact with patient to assess and evaluate symptoms and progress in treatment and Medication management  1) Bipolar Affective Disorder, current episode manic with psychotic symptoms- established problem, improving - Continue Depakote 750 BID - VPA level 84, and will recheck tonight now that he is taking more consistently and complaining of fatigue -  RPR negative - Continue Seroquel 400 mg QHS - Coninue Lamictal 25 mg daily and titrate biweekly. He has tolerated Lamictal 100 mg daily along with Depakote in the past despite drug-drug interaction. Will titrate slowly given questionable compliance previous to admission. No rashes on exam.    2-4) History of CVA, SVT, HTN- established problems, all stable - Plavix 75 mg daily, metoprolol XL 50 mg daily  5) BPH- established problem, stable - Flomax 0.4 mg daily  03/26/20: Psychiatric exam above reviewed and remains accurate. Assessment and plan above reviewed and updated.        Jesse Sans, MD 03/26/2020, 1:43 PM

## 2020-03-26 NOTE — Care Management Important Message (Signed)
Important Message  Patient Details  Name: Austin Rhodes MRN: [id number] Date of Birth: Aug 10, 1950   Medicare Important Message Given:  Yes  CSW reviewed with the patient. Patient stated that he wanted to review the document and would follow up with this CSW.    Harden Mo, LCSW 03/26/2020, 2:43 PM

## 2020-03-26 NOTE — Progress Notes (Signed)
Recreation Therapy Notes  Date: 03/26/2020  Time: 9:30 am   Location: Craft room     Behavioral response: N/A   Intervention Topic: Problem Solving    Discussion/Intervention: Patient did not attend group.   Clinical Observations/Feedback:  Patient did not attend group.   Lamont Tant LRT/CTRS        Austin Rhodes 03/26/2020 11:36 AM

## 2020-03-26 NOTE — Progress Notes (Signed)
Pt is alert and oriented to person, place, time but not to situation. 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 Pt reports that he is giving all the nurses a 50% raise and buying them all a new car. Pt offered to buy a house for this writer and/or pay off any remaining mortgage. Pt does not respond well to reality orientation and insists to be called Dr. Jearld Lesch. Pt is no longer noted to be intrusive, no agitation has been noted. Pt spends a great deal of time watching tv. The order for a 1:1 with staff sitter was discontinued this shift. Pt spends time talking to his daughter on the phone. Pt is medication compliant, complained of constipation, was given PRN MOM PO and it was effective; pt reported having a BM today. Pt also complained that his Depakote caused leg cramps and nausea which was reported to Dr. Neale Burly. Pt walked the hallways, and this c/o of leg cramps subsided after walking. Pt's gait is steady. No acute distress reported or noted. Will continue to monitor pt per Q15 minute face checks and monitor for safety and progress.

## 2020-03-27 MED ORDER — DIVALPROEX SODIUM 500 MG PO DR TAB: DELAYED_RELEASE_TABLET | ORAL | 1 refills | Status: DC

## 2020-03-27 MED ORDER — LAMOTRIGINE 35 X 25 MG PO KIT: 25.0000 mg | PACK | ORAL | 0 refills | Status: DC

## 2020-03-27 MED ORDER — QUETIAPINE FUMARATE 400 MG PO TABS: 400.0000 mg | ORAL_TABLET | Freq: Every day | ORAL | 1 refills | Status: DC

## 2020-03-27 NOTE — Discharge Summary (Signed)
Physician Discharge Summary Note  Patient:  Austin Rhodes is an 70 y.o., male MRN:  [id number] DOB:  1950-04-05 Patient phone:  [phone] (home)  Patient address:   [address] [place] [address],  Total Time spent with patient: 35 minutes- 25 minutes face-to-face contact with patient, 10 minutes documentation, coordination of care, scripts   Date of Admission:  03/15/2020 Date of Discharge: 03/27/2020  Reason for Admission:   70 year old male with bipolar disorder presenting under IVC petition for manic episode with psychosis  Principal Problem: Bipolar affective disorder, current episode manic with psychotic symptoms ([place]) Discharge Diagnoses: Principal Problem:   Bipolar affective disorder, current episode manic with psychotic symptoms ([place]) Active Problems:   HTN (hypertension)   GERD (gastroesophageal reflux disease)   History of CVA (cerebrovascular accident)   BPH (benign prostatic hyperplasia)   SVT (supraventricular tachycardia) ([place])   Past Psychiatric History: History of bipolar disorder. Patient chronically hypomanic at baseline, but typically controlled to point he does not cause problems. No known history of suicide attempts or substance abuse.   Past Medical History:  Past Medical History:  Diagnosis Date  . Bipolar disorder ([place])   . Coronary artery disease   . GERD (gastroesophageal reflux disease)   . History of multiple strokes   . PTSD (post-traumatic stress disorder)     Past Surgical History:  Procedure Laterality Date  . CARDIAC CATHETERIZATION     Family History: History reviewed. No pertinent family history. Family Psychiatric  History: Denies Social History:  Social History   Substance and Sexual Activity  Alcohol Use No     Social History   Substance and Sexual Activity  Drug Use Never    Social History   Socioeconomic History  . Marital status: Divorced    Spouse name: Not on file  . Number of children: Not  on file  . Years of education: Not on file  . Highest education level: Not on file  Occupational History  . Not on file  Tobacco Use  . Smoking status: Never Smoker  . Smokeless tobacco: Never Used  Vaping Use  . Vaping Use: Never used  Substance and Sexual Activity  . Alcohol use: No  . Drug use: Never  . Sexual activity: Not on file  Other Topics Concern  . Not on file  Social History Narrative  . Not on file   Social Determinants of Health   Financial Resource Strain: Not on file  Food Insecurity: Not on file  Transportation Needs: Not on file  Physical Activity: Not on file  Stress: Not on file  Social Connections: Not on file    Hospital Course:  70 year old male with bipolar disorder presenting under IVC petition for manic episode with psychosis. Patient was initially verbally and physically aggressive requiring 1:1 sitter on our back hall. He was placed on non-emergent forced medications so if he refuse Depakote he was to be given Zyprexa 10 mg daily. His mood gradually improved with medication compliance with Depakote, restart of lamictal, and increasing Seroquel to 400 mg QHS. His depakote level was 86 on Depakote 1500 total mg. He complained of some leg cramps and fatigue feeling that depakote level was too high, on repeat level was 57. At time of discharge he agreed that Depakote, lamictal, and Seroquel worked well for him. He was no longer aggressive towards staff, banging on walls, or throwing items. He was able to successfully discontinue 1:1 and step down to main unit. At  time of discharge he remained with his baseline hypomania and somewhat tangential speech. However, he was back to his baseline pleasant demeanor with normal rate of speech. He denied suicidal ideations, homicidal ideations, visual hallucinations, and auditory hallucinations.   Physical Findings: AIMS: Facial and Oral Movements Muscles of Facial Expression: None, normal Lips and Perioral Area: None,  normal Jaw: None, normal Tongue: None, normal,Extremity Movements Upper (arms, wrists, hands, fingers): None, normal Lower (legs, knees, ankles, toes): None, normal, Trunk Movements Neck, shoulders, hips: None, normal, Overall Severity Severity of abnormal movements (highest score from questions above): None, normal Incapacitation due to abnormal movements: None, normal Patient's awareness of abnormal movements (rate only patient's report): No Awareness, Dental Status Current problems with teeth and/or dentures?: No Does patient usually wear dentures?: No  CIWA:    COWS:     Musculoskeletal: Strength & Muscle Tone: within normal limits Gait & Station: normal Patient leans: N/A  Psychiatric Specialty Exam: Physical Exam Vitals and nursing note reviewed.  Constitutional:      Appearance: Normal appearance.  HENT:     Head: Normocephalic and atraumatic.     Right Ear: External ear normal.     Left Ear: External ear normal.     Nose: Nose normal.     Mouth/Throat:     Mouth: Mucous membranes are moist.     Pharynx: Oropharynx is clear.  Eyes:     Extraocular Movements: Extraocular movements intact.     Conjunctiva/sclera: Conjunctivae normal.     Pupils: Pupils are equal, round, and reactive to light.  Cardiovascular:     Rate and Rhythm: Normal rate.     Pulses: Normal pulses.  Pulmonary:     Effort: Pulmonary effort is normal.     Breath sounds: Normal breath sounds.  Abdominal:     General: Abdomen is flat.     Palpations: Abdomen is soft.  Musculoskeletal:        General: No swelling. Normal range of motion.  Skin:    General: Skin is warm and dry.  Neurological:     General: No focal deficit present.     Mental Status: He is alert and oriented to person, place, and time.  Psychiatric:        Mood and Affect: Mood normal.        Behavior: Behavior normal.        Judgment: Judgment normal.     Review of Systems  Constitutional: Negative for chills and  fatigue.  HENT: Negative for rhinorrhea and sinus pain.   Eyes: Negative for photophobia and visual disturbance.  Respiratory: Negative for cough and shortness of breath.   Cardiovascular: Negative for chest pain and palpitations.  Gastrointestinal: Negative for constipation, diarrhea, nausea and vomiting.  Endocrine: Negative for cold intolerance and heat intolerance.  Genitourinary: Negative for difficulty urinating and dysuria.  Musculoskeletal: Negative for arthralgias and myalgias.  Skin: Negative for rash and wound.  Allergic/Immunologic: Negative for food allergies and immunocompromised state.  Neurological: Negative for dizziness and headaches.  Hematological: Negative for adenopathy. Does not bruise/bleed easily.  Psychiatric/Behavioral: Negative for behavioral problems, dysphoric mood, hallucinations and suicidal ideas.    Blood pressure (!) 129/94, pulse (!) 102, temperature 97.7 F (36.5 C), temperature source Oral, resp. rate 18, height '5\' 10"'  (1.778 m), weight 71.2 kg, SpO2 95 %.Body mass index is 22.53 kg/m.  General Appearance: Casual  Eye Contact::  Good  Speech:  Clear and Coherent  Volume:  Normal  Mood:  Euthymic  Affect:  Congruent  Thought Process:  Coherent  Orientation:  Full (Time, Place, and Person)  Thought Content:  Tangential  Suicidal Thoughts:  No  Homicidal Thoughts:  No  Memory:  Immediate;   Fair  Judgement:  Intact  Insight:  Present  Psychomotor Activity:  Normal  Concentration:  Fair  Recall:  Alex of Knowledge:Fair  Language: Fair  Akathisia:  Negative  Handed:  Right  AIMS (if indicated):     Assets:  Communication Skills Desire for Improvement Financial Resources/Insurance Housing Resilience Social Support  Sleep:  Number of Hours: 6.5  Cognition: WNL  ADL's:  Intact           Has this patient used any form of tobacco in the last 30 days? (Cigarettes, Smokeless Tobacco, Cigars, and/or Pipes) No  Blood Alcohol  level:  Lab Results  Component Value Date   ETH <10 03/13/2020   ETH <10 93/71/6967    Metabolic Disorder Labs:  Lab Results  Component Value Date   HGBA1C 5.5 03/15/2020   MPG 111.15 03/15/2020   MPG 114.02 03/13/2020   No results found for: PROLACTIN Lab Results  Component Value Date   CHOL 115 03/15/2020   TRIG 117 03/15/2020   HDL 52 03/15/2020   CHOLHDL 2.2 03/15/2020   VLDL 23 03/15/2020   LDLCALC 40 03/15/2020   LDLCALC 53 03/13/2020    See Psychiatric Specialty Exam and Suicide Risk Assessment completed by Attending Physician prior to discharge.  Discharge destination:  Home  Is patient on multiple antipsychotic therapies at discharge:  No   Has Patient had three or more failed trials of antipsychotic monotherapy by history:  No  Recommended Plan for Multiple Antipsychotic Therapies: NA  Discharge Instructions    Diet - low sodium heart healthy   Complete by: As directed    Increase activity slowly   Complete by: As directed      Allergies as of 03/27/2020      Reactions   Procaine Other (See Comments)      Medication List    STOP taking these medications   lamoTRIgine 100 MG tablet Commonly known as: LAMICTAL Replaced by: Lamotrigine 35 x 25 MG Kit     TAKE these medications     Indication  acetaminophen 325 MG tablet Commonly known as: TYLENOL Take 2 tablets (650 mg total) by mouth every 6 (six) hours as needed for mild pain or moderate pain.  Indication: Pain   clopidogrel 75 MG tablet Commonly known as: PLAVIX Take 1 tablet (75 mg total) by mouth daily.  Indication: Per PCP   divalproex 500 MG DR tablet Commonly known as: DEPAKOTE Take 1 tablet (500 mg total) by mouth every morning AND 2 tablets (1,000 mg total) at bedtime. What changed:   See the new instructions.  Another medication with the same name was removed. Continue taking this medication, and follow the directions you see here.  Indication: Manic Phase of  Manic-Depression   hydrOXYzine 50 MG tablet Commonly known as: ATARAX/VISTARIL Take 1 tablet (50 mg total) by mouth 3 (three) times daily as needed for anxiety.  Indication: Feeling Anxious   Lamotrigine 35 x 25 MG Kit Take 25-100 mg by mouth as directed. Replaces: lamoTRIgine 100 MG tablet  Indication: Manic-Depression   metoprolol succinate 25 MG 24 hr tablet Commonly known as: TOPROL-XL Take 25 mg by mouth daily. What changed: Another medication with the same name was removed. Continue taking this medication, and follow the directions  you see here.  Indication: High Blood Pressure Disorder   QUEtiapine 400 MG tablet Commonly known as: SEROQUEL Take 1 tablet (400 mg total) by mouth at bedtime. What changed:   medication strength  how much to take  Another medication with the same name was removed. Continue taking this medication, and follow the directions you see here.  Indication: Manic-Depression   rosuvastatin 10 MG tablet Commonly known as: CRESTOR Take 1 tablet (10 mg total) by mouth daily.  Indication: High Amount of Fats in the Blood   tamsulosin 0.4 MG Caps capsule Commonly known as: FLOMAX Take 1 capsule (0.4 mg total) by mouth daily after supper.  Indication: Per PCP        Follow-up recommendations:  Activity:  as tolerated Diet:  low-sodium heart healthy diet  Comments:  Patient will stay on Lamictal 25 until March 3rd, and increase to 50 mg daily if no side effects, increase to 100 mg after 1 week if continues to have no side effects. He has been on lamictal 100 mg adjust to Depakote in the past without side effects or stevens-johnson syndrome. Recommend repeating Depakote level in 1-2 weeks as well.   Signed: Salley Scarlet, MD 03/27/2020, 10:38 AM
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
## 2020-03-27 NOTE — Progress Notes (Signed)
  [facility] Adult Case Management Discharge Plan :  Will you be returning to the same living situation after discharge:  Yes,  pt's home At discharge, do you have transportation home?: Yes,  CSW arrange Safe Transport Do you have the ability to pay for your medications: Yes,  Medicare/Medicaid   Release of information consent forms completed and in the chart;  Patient's signature needed at discharge.  Patient to Follow up at:   Next level of care provider has access to [facility] Link:no  Safety Planning and Suicide Prevention discussed: No. Pt declined     Has patient been referred to the Quitline?: N/A patient is not a smoker  Patient has been referred for addiction treatment: N/A  Austin Herd A [place], LCSWA 03/27/2020, 10:33 AM

## 2020-03-27 NOTE — Progress Notes (Signed)
Recreation Therapy Notes  INPATIENT RECREATION TR PLAN  Patient Details Name: Austin Rhodes MRN: [id number] DOB: [birth year] Today's Date: 03/27/2020  Rec Therapy Plan Is patient appropriate for Therapeutic Recreation?: Yes Treatment times per week: at least 3 Estimated Length of Stay: 5-7 days TR Treatment/Interventions: Group participation (Comment)  Discharge Criteria Pt will be discharged from therapy if:: Discharged Treatment plan/goals/alternatives discussed and agreed upon by:: Patient/family  Discharge Summary Short term goals set: Patient will engage in groups without prompting or encouragement from LRT x3 group sessions within 5 recreation therapy group sessions Short term goals met: Not met Reason goals not met: Patient did not attend any groups Therapeutic equipment acquired: N/A Reason patient discharged from therapy: Discharge from hospital Pt/family agrees with progress & goals achieved: Yes Date patient discharged from therapy: 03/27/20   Ismail Graziani 03/27/2020, 12:18 PM

## 2020-03-27 NOTE — Progress Notes (Signed)
Patient alert and oriented x 2 with periods of confusion to situation and place , his thoughts are disorganized and incoherent, he appears grandiose and delusional,making threatening statement to staff and responding to internal stimuli. Patient's speech is tangential, he was rambling and argumentative with staff.  Patient was complaint with medication regimen, 15 minutes safety checks maintained will continue to monitor.

## 2020-03-27 NOTE — [facility] Suicide Risk Assessment (Signed)
[facility] Discharge Suicide Risk Assessment   Principal Problem: Bipolar affective disorder, current episode manic with psychotic symptoms [facility]) Discharge Diagnoses: Principal Problem:   Bipolar affective disorder, current episode manic with psychotic symptoms ([facility]) Active Problems:   HTN (hypertension)   GERD (gastroesophageal reflux disease)   History of CVA (cerebrovascular accident)   BPH (benign prostatic hyperplasia)   SVT (supraventricular tachycardia) ([facility])   Total Time spent with patient: 35 minutes- 25 minutes face-to-face contact with patient, 10 minutes documentation, coordination of care, scripts   Musculoskeletal: Strength & Muscle Tone: within normal limits Gait & Station: normal Patient leans: N/A  Psychiatric Specialty Exam: Review of Systems  Constitutional: Negative for chills and fatigue.  HENT: Negative for rhinorrhea and sinus pain.   Eyes: Negative for photophobia and visual disturbance.  Respiratory: Negative for cough and shortness of breath.   Cardiovascular: Negative for chest pain and palpitations.  Gastrointestinal: Negative for constipation, diarrhea, nausea and vomiting.  Endocrine: Negative for cold intolerance and heat intolerance.  Genitourinary: Negative for difficulty urinating and dysuria.  Musculoskeletal: Negative for arthralgias and myalgias.  Skin: Negative for rash and wound.  Allergic/Immunologic: Negative for food allergies and immunocompromised state.  Neurological: Negative for dizziness and headaches.  Hematological: Negative for adenopathy. Does not bruise/bleed easily.  Psychiatric/Behavioral: Negative for behavioral problems, dysphoric mood, hallucinations and suicidal ideas.    Blood pressure (!) 129/94, pulse (!) 102, temperature 97.7 F (36.5 C), temperature source Oral, resp. rate 18, height 5\' 10"  (1.778 m), weight 71.2 kg, SpO2 95 %.Body mass index is 22.53 kg/m.  General Appearance: Casual  Eye Contact::  Good  Speech:   Clear and Coherent  Volume:  Normal  Mood:  Euthymic  Affect:  Congruent  Thought Process:  Coherent  Orientation:  Full (Time, Place, and Person)  Thought Content:  Tangential  Suicidal Thoughts:  No  Homicidal Thoughts:  No  Memory:  Immediate;   Fair  Judgement:  Intact  Insight:  Present  Psychomotor Activity:  Normal  Concentration:  Fair  Recall:  [phone] of Knowledge:Fair  Language: Fair  Akathisia:  Negative  Handed:  Right  AIMS (if indicated):     Assets:  Communication Skills Desire for Improvement Financial Resources/Insurance Housing Resilience Social Support  Sleep:  Number of Hours: 6.5  Cognition: WNL  ADL's:  Intact   Mental Status Per Nursing Assessment::   On Admission:  NA  Demographic Factors:  Male, Age 35 or older and Caucasian  Loss Factors: NA  Historical Factors: NA  Risk Reduction Factors:   Sense of responsibility to family, Religious beliefs about death, Positive social support, Positive therapeutic relationship and Positive coping skills or problem solving skills  Continued Clinical Symptoms:  Bipolar Disorder:   Mixed State Previous Psychiatric Diagnoses and Treatments  Cognitive Features That Contribute To Risk:  None    Suicide Risk:  Minimal: No identifiable suicidal ideation.  Patients presenting with no risk factors but with morbid ruminations; may be classified as minimal risk based on the severity of the depressive symptoms    Plan Of Care/Follow-up recommendations:  Activity:  as tolerated Diet:  low sodium heart healthy diet  76, MD 03/27/2020, 10:28 AM

## 2020-03-27 NOTE — Progress Notes (Signed)
Recreation Therapy Notes   Date: 03/27/2020  Time: 9:30 am   Location: Craft room     Behavioral response: N/A   Intervention Topic: Stress Management   Discussion/Intervention: Patient did not attend group.   Clinical Observations/Feedback:  Patient did not attend group.   Alethia Melendrez LRT/CTRS        Harjit Douds 03/27/2020 11:26 AM 

## 2020-03-27 NOTE — Progress Notes (Signed)
Patient denies SI/HI, denies A/V hallucinations. Patient verbalizes understanding of discharge instructions, follow up care and prescriptions. Patient given all belongings from [facility] locker. Patient escorted out by staff, transported by safe transport. 

## 2020-08-26 ENCOUNTER — Ambulatory Visit
Admission: RE | Admit: 2020-08-26 | Discharge: 2020-08-26 | Disposition: A | Discharge status: Home / Self Care | Payer: Medicare Other | Source: Ambulatory Visit | Attending: Emergency Medicine | Admitting: Emergency Medicine

## 2020-08-26 ENCOUNTER — Other Ambulatory Visit: Payer: Self-pay

## 2020-08-26 VITALS — BP 119/79 | HR 88 | Temp 99.4°F | Resp 16

## 2020-08-26 DIAGNOSIS — Z20822 Contact with and (suspected) exposure to covid-19: Secondary | ICD-10-CM | POA: Diagnosis not present

## 2020-08-26 DIAGNOSIS — J0140 Acute pansinusitis, unspecified: Secondary | ICD-10-CM | POA: Diagnosis not present

## 2020-08-26 DIAGNOSIS — R059 Cough, unspecified: Secondary | ICD-10-CM

## 2020-08-26 MED ORDER — DOXYCYCLINE HYCLATE 100 MG PO CAPS: 100.0000 mg | ORAL_CAPSULE | Freq: Two times a day (BID) | ORAL | 0 refills | Status: AC

## 2020-08-26 MED ORDER — AEROCHAMBER PLUS MISC: 2 refills | Status: DC

## 2020-08-26 MED ORDER — AMOXICILLIN-POT CLAVULANATE 875-125 MG PO TABS: 1.0000 | ORAL_TABLET | Freq: Two times a day (BID) | ORAL | 0 refills | Status: DC

## 2020-08-26 MED ORDER — ALBUTEROL SULFATE HFA 108 (90 BASE) MCG/ACT IN AERS: 1.0000 | INHALATION_SPRAY | RESPIRATORY_TRACT | 0 refills | Status: DC | PRN

## 2020-08-26 MED ORDER — FLUTICASONE PROPIONATE 50 MCG/ACT NA SUSP: 2.0000 | Freq: Every day | NASAL | 0 refills | Status: DC

## 2020-08-26 NOTE — ED Triage Notes (Signed)
PT's visitor lives with him and was covid positive last week. He is experiencing cough, shortness of breath, and fever. Symptoms started 5-6 days ago.

## 2020-08-26 NOTE — Discharge Instructions (Addendum)
Finish both of the antibiotics.  This will cover both a sinus infection and pneumonia.  Use your inhaler every 4-6 hours with a spacer as needed for coughing, wheezing, shortness of breath.  This will help open up your lungs.  Your COVID test will be back in a day or 2.  May take 1000 mg of Tylenol 3-4 times a day as needed for body aches.  Monitor your oxygen saturation.

## 2020-08-26 NOTE — ED Provider Notes (Signed)
HPI  SUBJECTIVE:  Austin Rhodes is a 70 y.o. male who presents with Fevers T-max 102, body aches, generalized weakness, nasal congestion, rhinorrhea, postnasal drip, sinus pain and pressure, decreased smell and taste, cough, wheeze, shortness of breath, dyspnea on exertion and "brain fog" for the past 7 or 8 days,. His family member, who lives with him, had COVID last week.  No nausea, vomiting, diarrhea, abdominal pain.  He got the second COVID booster.  He has tried Tylenol, cough drops, soups, over-the-counter cough syrup.  No aggravating or alleviating factors. Past medical history of coronary artery disease, multiple strokes, bipolar, well-controlled hypertension.  No history of chronic kidney disease, pulmonary disease, smoking, diabetes.  PMD: Lynnell Jude, MD  Additional history obtained from family member.  Past Medical History:  Diagnosis Date   Bipolar disorder ([place])    Coronary artery disease    GERD (gastroesophageal reflux disease)    History of multiple strokes    PTSD (post-traumatic stress disorder)     Past Surgical History:  Procedure Laterality Date   CARDIAC CATHETERIZATION      History reviewed. No pertinent family history.  Social History   Tobacco Use   Smoking status: Never   Smokeless tobacco: Never  Vaping Use   Vaping Use: Never used  Substance Use Topics   Alcohol use: No   Drug use: Never    No current facility-administered medications for this encounter.  Current Outpatient Medications:    albuterol (VENTOLIN HFA) 108 (90 Base) MCG/ACT inhaler, Inhale 1-2 puffs into the lungs every 4 (four) hours as needed for wheezing or shortness of breath., Disp: 1 each, Rfl: 0   amoxicillin-clavulanate (AUGMENTIN) 875-125 MG tablet, Take 1 tablet by mouth 2 (two) times daily. X 7 days, Disp: 14 tablet, Rfl: 0   clopidogrel (PLAVIX) 75 MG tablet, Take 1 tablet (75 mg total) by mouth daily., Disp: 30 tablet, Rfl: 1   divalproex (DEPAKOTE) 500 MG DR  tablet, Take 1 tablet (500 mg total) by mouth every morning AND 2 tablets (1,000 mg total) at bedtime., Disp: 90 tablet, Rfl: 1   doxycycline (VIBRAMYCIN) 100 MG capsule, Take 1 capsule (100 mg total) by mouth 2 (two) times daily for 7 days., Disp: 14 capsule, Rfl: 0   fluticasone (FLONASE) 50 MCG/ACT nasal spray, Place 2 sprays into both nostrils daily., Disp: 16 g, Rfl: 0   Lamotrigine 35 x 25 MG KIT, Take 25-100 mg by mouth as directed., Disp: 1 kit, Rfl: 0   metoprolol succinate (TOPROL-XL) 25 MG 24 hr tablet, Take 25 mg by mouth daily., Disp: , Rfl:    QUEtiapine (SEROQUEL) 400 MG tablet, Take 1 tablet (400 mg total) by mouth at bedtime., Disp: 30 tablet, Rfl: 1   rosuvastatin (CRESTOR) 10 MG tablet, Take 1 tablet (10 mg total) by mouth daily., Disp: 30 tablet, Rfl: 1   Spacer/Aero-Holding Chambers (AEROCHAMBER PLUS) inhaler, Use with inhaler, Disp: 1 each, Rfl: 2   acetaminophen (TYLENOL) 325 MG tablet, Take 2 tablets (650 mg total) by mouth every 6 (six) hours as needed for mild pain or moderate pain., Disp: 30 tablet, Rfl: 0   hydrOXYzine (ATARAX/VISTARIL) 50 MG tablet, Take 1 tablet (50 mg total) by mouth 3 (three) times daily as needed for anxiety., Disp: 30 tablet, Rfl: 1   tamsulosin (FLOMAX) 0.4 MG CAPS capsule, Take 1 capsule (0.4 mg total) by mouth daily after supper., Disp: 30 capsule, Rfl: 1  Allergies  Allergen Reactions   Procaine Other (See  Comments)     ROS  As noted in HPI.   Physical Exam  BP 119/79   Pulse 88   Temp 99.4 F (37.4 C) (Oral)   Resp 16   SpO2 95%   Constitutional: Well developed, well nourished, no acute distress Eyes:  EOMI, conjunctiva normal bilaterally HENT: Normocephalic, atraumatic,mucus membranes moist.  Positive purulent nasal congestion.  Positive maxillary, frontal sinus tenderness.  Erythematous, swollen turbinates.  Normal tonsils without exudates.  Positive postnasal drip. Neck: Shotty cervical lymphadenopathy Respiratory: Normal  inspiratory effort, fair air movement, lungs clear bilaterally Cardiovascular: Normal rate, regular rhythm, no murmurs rubs or gallops GI: nondistended skin: No rash, skin intact Musculoskeletal: no deformities Neurologic: Alert & oriented x 3, no focal neuro deficits Psychiatric: Speech and behavior appropriate   ED Course   Medications - No data to display  Orders Placed This Encounter  Procedures   Novel Coronavirus, NAA ([facility])    Standing Status:   Standing    Number of Occurrences:   1    No results found for this or any previous visit (from the past 24 hour(s)). No results found.  ED Clinical Impression  1. Acute non-recurrent pansinusitis   2. Cough   3. Encounter for laboratory testing for COVID-19 virus      ED Assessment/Plan  GFR 48 from labs 03/13/20.  Did not check x-ray as it would not change management.  Patient is out of the window for antiviral treatment, however concern for secondary sinusitis plus or minus pneumonia given the cough, wheezing, shortness of breath and dyspnea on exertion.  Tylenol 1000 mg p.o. 3-4 times daily as needed body aches.  No NSAIDs because of Plavix.  Home with Augmentin 875 twice daily for 7 days, plus doxycycline 100 mg p.o. twice daily for 7 days, which will also cover sinusitis.  Sending him home on both antibiotics because he has multiple comorbidities.  Flonase, saline nasal irrigation, albuterol inhaler with a spacer.  Family members to monitor patient's oxygen saturation.  He is to follow-up with his primary care provider in about 3 days.  Strict ER return precautions given.  COVID pending at the time of signing of this note.  Discussed labs MDM, treatment plan, and plan for follow-up with family and patient.  Discussed sn/sx that should prompt return to the ED. they agree with plan.   Meds ordered this encounter  Medications   amoxicillin-clavulanate (AUGMENTIN) 875-125 MG tablet    Sig: Take 1 tablet by mouth 2 (two)  times daily. X 7 days    Dispense:  14 tablet    Refill:  0   doxycycline (VIBRAMYCIN) 100 MG capsule    Sig: Take 1 capsule (100 mg total) by mouth 2 (two) times daily for 7 days.    Dispense:  14 capsule    Refill:  0   fluticasone (FLONASE) 50 MCG/ACT nasal spray    Sig: Place 2 sprays into both nostrils daily.    Dispense:  16 g    Refill:  0   albuterol (VENTOLIN HFA) 108 (90 Base) MCG/ACT inhaler    Sig: Inhale 1-2 puffs into the lungs every 4 (four) hours as needed for wheezing or shortness of breath.    Dispense:  1 each    Refill:  0   Spacer/Aero-Holding Chambers (AEROCHAMBER PLUS) inhaler    Sig: Use with inhaler    Dispense:  1 each    Refill:  2    Please educate patient on use      *  This clinic note was created using Lobby[employer]. Therefore, there may be occasional mistakes despite careful proofreading.  ?    Melynda Ripple, MD 08/26/20 [phone]

## 2020-08-28 LAB — SARS-COV-2, NAA 2 DAY TAT

## 2020-08-28 LAB — NOVEL CORONAVIRUS, NAA: SARS-CoV-2, NAA: DETECTED — AB

## 2021-04-28 ENCOUNTER — Encounter: Payer: Self-pay | Admitting: Emergency Medicine

## 2021-04-28 ENCOUNTER — Emergency Department (EMERGENCY_DEPARTMENT_HOSPITAL)
Admission: EM | Admit: 2021-04-28 | Discharge: 2021-04-29 | Disposition: A | Discharge status: Other Acute Inpatient Hospital | Payer: Medicare Other | Source: Home / Self Care | Attending: Emergency Medicine | Admitting: Emergency Medicine

## 2021-04-28 ENCOUNTER — Other Ambulatory Visit: Payer: Self-pay

## 2021-04-28 DIAGNOSIS — F312 Bipolar disorder, current episode manic severe with psychotic features: Secondary | ICD-10-CM | POA: Insufficient documentation

## 2021-04-28 DIAGNOSIS — Z20822 Contact with and (suspected) exposure to covid-19: Secondary | ICD-10-CM | POA: Insufficient documentation

## 2021-04-28 DIAGNOSIS — F29 Unspecified psychosis not due to a substance or known physiological condition: Secondary | ICD-10-CM | POA: Insufficient documentation

## 2021-04-28 DIAGNOSIS — I2510 Atherosclerotic heart disease of native coronary artery without angina pectoris: Secondary | ICD-10-CM | POA: Insufficient documentation

## 2021-04-28 LAB — COMPREHENSIVE METABOLIC PANEL
ALT: 14 U/L (ref 0–44)
AST: 35 U/L (ref 15–41)
Albumin: 4.1 g/dL (ref 3.5–5.0)
Alkaline Phosphatase: 61 U/L (ref 38–126)
Anion gap: 20 — ABNORMAL HIGH (ref 5–15)
BUN: 13 mg/dL (ref 8–23)
CO2: 17 mmol/L — ABNORMAL LOW (ref 22–32)
Calcium: 9.1 mg/dL (ref 8.9–10.3)
Chloride: 104 mmol/L (ref 98–111)
Creatinine, Ser: 1.51 mg/dL — ABNORMAL HIGH (ref 0.61–1.24)
GFR, Estimated: 50 mL/min — ABNORMAL LOW (ref >60)
Glucose, Bld: 170 mg/dL — ABNORMAL HIGH (ref 70–99)
Potassium: 3.2 mmol/L — ABNORMAL LOW (ref 3.5–5.1)
Sodium: 141 mmol/L (ref 135–145)
Total Bilirubin: 0.8 mg/dL (ref 0.3–1.2)
Total Protein: 7.4 g/dL (ref 6.5–8.1)

## 2021-04-28 LAB — CBC
HCT: 48.6 % (ref 39.0–52.0)
Hemoglobin: 15.8 g/dL (ref 13.0–17.0)
MCH: 30.1 pg (ref 26.0–34.0)
MCHC: 32.5 g/dL (ref 30.0–36.0)
MCV: 92.6 fL (ref 80.0–100.0)
Platelets: 215 10*3/uL (ref 150–400)
RBC: 5.25 MIL/uL (ref 4.22–5.81)
RDW: 13 % (ref 11.5–15.5)
WBC: 10.9 10*3/uL — ABNORMAL HIGH (ref 4.0–10.5)
nRBC: 0 % (ref 0.0–0.2)

## 2021-04-28 LAB — ACETAMINOPHEN LEVEL: Acetaminophen (Tylenol), Serum: <10 ug/mL — ABNORMAL LOW (ref 10–30)

## 2021-04-28 LAB — ETHANOL: Alcohol, Ethyl (B): <10 mg/dL (ref <10)

## 2021-04-28 LAB — SALICYLATE LEVEL: Salicylate Lvl: <7 mg/dL — ABNORMAL LOW (ref 7.0–30.0)

## 2021-04-28 MED ORDER — HALOPERIDOL LACTATE 5 MG/ML IJ SOLN
2.0000 mg | Freq: Once | INTRAMUSCULAR | Status: AC
  Administered 2021-04-28: 2 mg via INTRAMUSCULAR
  Filled 2021-04-28: qty 1

## 2021-04-28 NOTE — ED Triage Notes (Signed)
Pt presents via [place] [facility]- ivc. Pt with manic behavior & delusional. Pt irate when police bringing back to room. 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 I'm going to have to start narrowing down the pussies. I have many celebrities coming to my party, I have to get out of here". Pt with rapid speech. Pt not responding to questions appropriately.  ?

## 2021-04-29 ENCOUNTER — Inpatient Hospital Stay
Admission: AD | Admit: 2021-04-29 | Discharge: 2021-06-02 | DRG: 885 | Disposition: A | Discharge status: Home / Self Care | Payer: Medicare Other | Attending: Psychiatry | Admitting: Psychiatry

## 2021-04-29 ENCOUNTER — Encounter: Payer: Self-pay | Admitting: Psychiatry

## 2021-04-29 DIAGNOSIS — I2510 Atherosclerotic heart disease of native coronary artery without angina pectoris: Secondary | ICD-10-CM | POA: Diagnosis present

## 2021-04-29 DIAGNOSIS — Z7902 Long term (current) use of antithrombotics/antiplatelets: Secondary | ICD-10-CM | POA: Diagnosis not present

## 2021-04-29 DIAGNOSIS — F4310 Post-traumatic stress disorder, unspecified: Secondary | ICD-10-CM | POA: Diagnosis present

## 2021-04-29 DIAGNOSIS — Z79899 Other long term (current) drug therapy: Secondary | ICD-10-CM

## 2021-04-29 DIAGNOSIS — Z20822 Contact with and (suspected) exposure to covid-19: Secondary | ICD-10-CM | POA: Diagnosis present

## 2021-04-29 DIAGNOSIS — Z6825 Body mass index (BMI) 25.0-25.9, adult: Secondary | ICD-10-CM | POA: Diagnosis not present

## 2021-04-29 DIAGNOSIS — F312 Bipolar disorder, current episode manic severe with psychotic features: Principal | ICD-10-CM | POA: Diagnosis present

## 2021-04-29 DIAGNOSIS — F29 Unspecified psychosis not due to a substance or known physiological condition: Principal | ICD-10-CM

## 2021-04-29 DIAGNOSIS — Z8673 Personal history of transient ischemic attack (TIA), and cerebral infarction without residual deficits: Secondary | ICD-10-CM

## 2021-04-29 DIAGNOSIS — Z7951 Long term (current) use of inhaled steroids: Secondary | ICD-10-CM

## 2021-04-29 DIAGNOSIS — Z884 Allergy status to anesthetic agent status: Secondary | ICD-10-CM

## 2021-04-29 DIAGNOSIS — E669 Obesity, unspecified: Secondary | ICD-10-CM | POA: Diagnosis present

## 2021-04-29 DIAGNOSIS — F319 Bipolar disorder, unspecified: Secondary | ICD-10-CM | POA: Diagnosis present

## 2021-04-29 DIAGNOSIS — K219 Gastro-esophageal reflux disease without esophagitis: Secondary | ICD-10-CM | POA: Diagnosis present

## 2021-04-29 LAB — URINALYSIS, COMPLETE (UACMP) WITH MICROSCOPIC
Bacteria, UA: NONE SEEN
Bilirubin Urine: NEGATIVE
Glucose, UA: NEGATIVE mg/dL
Hgb urine dipstick: NEGATIVE
Ketones, ur: NEGATIVE mg/dL
Leukocytes,Ua: NEGATIVE
Nitrite: NEGATIVE
Protein, ur: NEGATIVE mg/dL
Specific Gravity, Urine: 1.01 (ref 1.005–1.030)
pH: 6 (ref 5.0–8.0)

## 2021-04-29 LAB — BASIC METABOLIC PANEL
Anion gap: 9 (ref 5–15)
BUN: 13 mg/dL (ref 8–23)
CO2: 28 mmol/L (ref 22–32)
Calcium: 9.3 mg/dL (ref 8.9–10.3)
Chloride: 104 mmol/L (ref 98–111)
Creatinine, Ser: 1.07 mg/dL (ref 0.61–1.24)
GFR, Estimated: >60 mL/min (ref >60)
Glucose, Bld: 99 mg/dL (ref 70–99)
Potassium: 3 mmol/L — ABNORMAL LOW (ref 3.5–5.1)
Sodium: 141 mmol/L (ref 135–145)

## 2021-04-29 LAB — CBG MONITORING, ED
Glucose-Capillary: 94 mg/dL (ref 70–99)
Glucose-Capillary: 93 mg/dL (ref 70–99)

## 2021-04-29 LAB — HEMOGLOBIN A1C
Hgb A1c MFr Bld: 5.3 % (ref 4.8–5.6)
Mean Plasma Glucose: 105.41 mg/dL

## 2021-04-29 LAB — URINE DRUG SCREEN, QUALITATIVE (ARMC ONLY)
Amphetamines, Ur Screen: NOT DETECTED
Barbiturates, Ur Screen: NOT DETECTED
Benzodiazepine, Ur Scrn: NOT DETECTED
Cannabinoid 50 Ng, Ur ~~LOC~~: NOT DETECTED
Cocaine Metabolite,Ur ~~LOC~~: NOT DETECTED
MDMA (Ecstasy)Ur Screen: NOT DETECTED
Methadone Scn, Ur: NOT DETECTED
Opiate, Ur Screen: NOT DETECTED
Phencyclidine (PCP) Ur S: NOT DETECTED
Tricyclic, Ur Screen: NOT DETECTED

## 2021-04-29 LAB — RESP PANEL BY RT-PCR (FLU A&B, COVID) ARPGX2
Influenza A by PCR: NEGATIVE
Influenza B by PCR: NEGATIVE
SARS Coronavirus 2 by RT PCR: NEGATIVE

## 2021-04-29 MED ORDER — LORAZEPAM 2 MG/ML IJ SOLN: 2.0000 mg | Freq: Once | INTRAMUSCULAR | Status: AC

## 2021-04-29 MED ORDER — LORAZEPAM 1 MG PO TABS
2.0000 mg | ORAL_TABLET | Freq: Four times a day (QID) | ORAL | Status: DC | PRN
Start: 2021-04-29 — End: 2021-04-30
  Administered 2021-04-29: 2 mg via ORAL
  Filled 2021-04-29: qty 2

## 2021-04-29 MED ORDER — MAGNESIUM HYDROXIDE 400 MG/5ML PO SUSP: 30.0000 mL | Freq: Every day | ORAL | Status: DC | PRN

## 2021-04-29 MED ORDER — ALUM & MAG HYDROXIDE-SIMETH 200-200-20 MG/5ML PO SUSP: 30.0000 mL | ORAL | Status: DC | PRN

## 2021-04-29 MED ORDER — INSULIN ASPART 100 UNIT/ML IJ SOLN: 0.0000 [IU] | Freq: Three times a day (TID) | INTRAMUSCULAR | Status: DC

## 2021-04-29 MED ORDER — ACETAMINOPHEN 325 MG PO TABS
650.0000 mg | ORAL_TABLET | Freq: Four times a day (QID) | ORAL | Status: DC | PRN
  Administered 2021-05-01 – 2021-05-28 (×3): 650 mg via ORAL
  Filled 2021-04-29 (×3): qty 2

## 2021-04-29 MED ORDER — DIVALPROEX SODIUM ER 250 MG PO TB24
500.0000 mg | ORAL_TABLET | Freq: Three times a day (TID) | ORAL | Status: DC
  Administered 2021-04-29: 500 mg via ORAL
  Filled 2021-04-29: qty 2

## 2021-04-29 MED ORDER — OLANZAPINE 10 MG IM SOLR
10.0000 mg | Freq: Four times a day (QID) | INTRAMUSCULAR | Status: DC | PRN
  Filled 2021-04-29: qty 10

## 2021-04-29 MED ORDER — QUETIAPINE FUMARATE 200 MG PO TABS: 400.0000 mg | ORAL_TABLET | Freq: Every day | ORAL | Status: DC

## 2021-04-29 MED ORDER — INSULIN ASPART 100 UNIT/ML IJ SOLN: 0.0000 [IU] | Freq: Every day | INTRAMUSCULAR | Status: DC

## 2021-04-29 MED ORDER — LORAZEPAM 2 MG/ML IJ SOLN
2.0000 mg | Freq: Four times a day (QID) | INTRAMUSCULAR | Status: DC | PRN
  Administered 2021-04-29: 2 mg via INTRAMUSCULAR
  Filled 2021-04-29 (×2): qty 1

## 2021-04-29 MED ORDER — LOSARTAN POTASSIUM 25 MG PO TABS
50.0000 mg | ORAL_TABLET | Freq: Every day | ORAL | Status: DC
  Administered 2021-04-30 – 2021-05-12 (×13): 50 mg via ORAL
  Filled 2021-04-29 (×13): qty 2

## 2021-04-29 MED ORDER — LORAZEPAM 2 MG/ML IJ SOLN
INTRAMUSCULAR | Status: AC
  Administered 2021-04-29: 2 mg via INTRAVENOUS
  Filled 2021-04-29: qty 1

## 2021-04-29 MED ORDER — QUETIAPINE FUMARATE 100 MG PO TABS
400.0000 mg | ORAL_TABLET | Freq: Every day | ORAL | Status: DC
  Administered 2021-04-29 – 2021-06-01 (×31): 400 mg via ORAL
  Filled 2021-04-29 (×33): qty 4

## 2021-04-29 MED ORDER — ALBUTEROL SULFATE (2.5 MG/3ML) 0.083% IN NEBU: 3.0000 mL | INHALATION_SOLUTION | RESPIRATORY_TRACT | Status: DC | PRN

## 2021-04-29 MED ORDER — ROSUVASTATIN CALCIUM 5 MG PO TABS
10.0000 mg | ORAL_TABLET | Freq: Every day | ORAL | Status: DC
  Administered 2021-04-30 – 2021-06-02 (×34): 10 mg via ORAL
  Filled 2021-04-29 (×34): qty 2

## 2021-04-29 MED ORDER — POTASSIUM CHLORIDE CRYS ER 20 MEQ PO TBCR
20.0000 meq | EXTENDED_RELEASE_TABLET | Freq: Every day | ORAL | Status: DC
  Administered 2021-05-01 – 2021-05-05 (×5): 20 meq via ORAL
  Filled 2021-04-29 (×6): qty 1

## 2021-04-29 MED ORDER — ROSUVASTATIN CALCIUM 10 MG PO TABS
10.0000 mg | ORAL_TABLET | Freq: Every day | ORAL | Status: DC
  Administered 2021-04-29: 10 mg via ORAL
  Filled 2021-04-29: qty 1

## 2021-04-29 MED ORDER — HYDROXYZINE HCL 25 MG PO TABS: 50.0000 mg | ORAL_TABLET | Freq: Three times a day (TID) | ORAL | Status: DC | PRN

## 2021-04-29 MED ORDER — LACTATED RINGERS IV BOLUS
1000.0000 mL | Freq: Once | INTRAVENOUS | Status: AC
  Administered 2021-04-29: 1000 mL via INTRAVENOUS

## 2021-04-29 MED ORDER — INSULIN ASPART 100 UNIT/ML IJ SOLN: 0.0000 [IU] | INTRAMUSCULAR | Status: DC

## 2021-04-29 MED ORDER — TRAZODONE HCL 100 MG PO TABS
100.0000 mg | ORAL_TABLET | Freq: Every evening | ORAL | Status: DC | PRN
  Administered 2021-04-29 – 2021-05-14 (×14): 100 mg via ORAL
  Filled 2021-04-29 (×16): qty 1

## 2021-04-29 MED ORDER — ACETAMINOPHEN 325 MG PO TABS: 650.0000 mg | ORAL_TABLET | Freq: Four times a day (QID) | ORAL | Status: DC | PRN

## 2021-04-29 MED ORDER — OLANZAPINE 5 MG PO TABS
10.0000 mg | ORAL_TABLET | Freq: Four times a day (QID) | ORAL | Status: DC | PRN
Start: 2021-04-29 — End: 2021-04-30
  Administered 2021-04-29: 10 mg via ORAL
  Filled 2021-04-29: qty 2

## 2021-04-29 MED ORDER — CLOPIDOGREL BISULFATE 75 MG PO TABS
75.0000 mg | ORAL_TABLET | Freq: Every day | ORAL | Status: DC
  Administered 2021-04-30 – 2021-06-02 (×34): 75 mg via ORAL
  Filled 2021-04-29 (×37): qty 1

## 2021-04-29 MED ORDER — CLOPIDOGREL BISULFATE 75 MG PO TABS
75.0000 mg | ORAL_TABLET | Freq: Every day | ORAL | Status: DC
  Administered 2021-04-29: 75 mg via ORAL
  Filled 2021-04-29: qty 1

## 2021-04-29 MED ORDER — DIVALPROEX SODIUM ER 500 MG PO TB24
500.0000 mg | ORAL_TABLET | Freq: Three times a day (TID) | ORAL | Status: DC
Start: 2021-04-29 — End: 2021-05-18
  Administered 2021-04-29 – 2021-05-18 (×50): 500 mg via ORAL
  Filled 2021-04-29 (×53): qty 1

## 2021-04-29 MED ORDER — LACTATED RINGERS IV SOLN: INTRAVENOUS | Status: DC

## 2021-04-29 NOTE — BH Assessment (Signed)
Comprehensive Clinical Assessment (CCA) Note ? ?04/29/2021 ?Austin Rhodes ?[id number] ?Recommendations for Services/Supports/Treatments: Pt pending psych consult/disposition. ? ?Austin Rhodes is a 71 year old, English speaking, Caucasian male with a hx of Bipolar I disorder and paranoid schizophrenia. Pt presented to [facility] ED via [place] [facility] due to exhibiting manic and delusional behavior. 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 When asked why he'd been brought to the ED the pt stated, ?My dumb ass daughter had me arrested.? Pt was hypersexual and made numerous sexually inappropriate comments throughout the assessment.  Of note pt. presented with bizarre and delusional thought processes. Pt made statements that were indicative of abnormal mentation stating, ?I want kinky sex.? Pt was restless and disorganized. The pt. had no insight and poor judgement. Pt did not appear to be responding to internal or external stimuli. Pt presented with flight of ideas and irrelevant thought processes. Pt presented with a hypomanic, labile mood; affect was anxious. Pt denied SI/HI/AV/H.  ? ?Chief Complaint:  ?Chief Complaint  ?Patient presents with  ? Manic Behavior  ? ?Visit Diagnosis: Bipolar I disorder, current or most recent episode manic, with psychotic features  ? ? ?CCA Screening, Triage and Referral (STR) ? ?Patient Reported Information ?How did you hear about [place]? Other (Comment) Mudlogger) ? ?Referral name: No data recorded ?Referral phone number: No data recorded ? ?Whom do you see for routine medical problems? No data recorded ?Practice/Facility Name: No data recorded ?Practice/Facility Phone Number: No data recorded ?Name of Contact: No data recorded ?Contact Number: No data recorded ?Contact Fax Number: No data recorded ?Prescriber Name: No data recorded ?Prescriber Address (if  known): No data recorded ? ?What Is the Reason for Your Visit/Call Today? Pt presents via [place] [employer]- ivc. Pt with manic behavior & delusional. ? ?How Long Has This Been Causing You Problems? > than 6 months ? ?What Do You Feel Would Help You the Most Today? Treatment for Depression or other mood problem; Medication(s) ? ? ?Have You Recently Been in Any Inpatient Treatment (Hospital/Detox/Crisis Center/28-Day Program)? No data recorded ?Name/Location of Program/Hospital:No data recorded ?How Long Were You There? No data recorded ?When Were You Discharged? No data recorded ? ?Have You Ever Received Services From [employer] [facility] Before? No data recorded ?Who Do You See at [facility]? No data recorded ? ?Have You Recently Had Any Thoughts About Hurting Yourself? No ? ?Are You Planning to Commit Suicide/Harm Yourself At This time? No ? ? ?Have you Recently Had Thoughts About Hurting Someone Austin Rhodes? No ? ?Explanation: No data recorded ? ?Have You Used Any Alcohol or Drugs in the Past 24 Hours? No ? ?How Long Ago Did You Use Drugs or Alcohol? No data recorded ?What Did You Use and How Much? No data recorded ? ?Do You Currently Have a Therapist/Psychiatrist? No ? ?Name of Therapist/Psychiatrist: No data recorded ? ?Have You Been Recently Discharged From Any Office Practice or Programs? No ? ?Explanation of Discharge From Practice/Program: No data recorded ? ?  ?CCA Screening Triage Referral Assessment ?Type of Contact: Face-to-Face ? ?Is this Initial or Reassessment? No data recorded ?Date Telepsych consult ordered in [facility]:  No data recorded ?Time Telepsych consult ordered in CHL:  No data recorded ? ?Patient Reported Information Reviewed? No data recorded ?Patient Left Without Being Seen? No data recorded ?Reason for Not Completing Assessment: No data recorded ? ?Collateral Involvement: None  provided ? ? ?Does Patient Have a Automotive engineer Guardian? No data recorded ?Name and Contact of Legal Guardian: No data  recorded ?If Minor and Not Living with Parent(s), Who has Custody? n/a ? ?Is CPS involved or ever been involved? Never ? ?Is APS involved or ever been involved? Never ? ? ?Patient Determined To Be At Risk for Harm To Self or Others Based on Review of Patient Reported Information or Presenting Complaint? No ? ?Method: No data recorded ?Availability of Means: No data recorded ?Intent: No data recorded ?Notification Required: No data recorded ?Additional Information for Danger to Others Potential: No data recorded ?Additional Comments for Danger to Others Potential: No data recorded ?Are There Guns or Other Weapons in Your Home? No data recorded ?Types of Guns/Weapons: No data recorded ?Are These Weapons Safely Secured?                            No data recorded ?Who Could Verify You Are Able To Have These Secured: No data recorded ?Do You Have any Outstanding Charges, Pending Court Dates, Parole/Probation? No data recorded ?Contacted To Inform of Risk of Harm To Self or Others: No data recorded ? ?[facility] of Assessment: [facility] ED ? ? ?Does Patient Present under Involuntary Commitment? Yes ? ?IVC Papers Initial File Date: 04/29/21 ? ? ?[place] of Residence: [place] ? ? ?Patient Currently Receiving the Following Services: Not Receiving Services ? ? ?Determination of Need: Emergent (2 hours) ? ? ?Options For Referral: ED Visit; Therapeutic Triage Services ? ? ? ? ?CCA Biopsychosocial ?Intake/Chief Complaint:  No data recorded ?Current Symptoms/Problems: No data recorded ? ?Patient Reported Schizophrenia/Schizoaffective Diagnosis in Past: Yes ? ? ?Strengths: UTA ? ?Preferences: No data recorded ?Abilities: No data recorded ? ?Type of Services Patient Feels are Needed: No data recorded ? ?Initial Clinical Notes/Concerns: No data recorded ? ?Mental Health Symptoms ?Depression:   ?None ?  ?Duration of Depressive symptoms: No data recorded  ?Mania:   ?Change in energy/activity; Euphoria; Increased Energy; Irritability;  Overconfidence; Racing thoughts ?  ?Anxiety:    ?Difficulty concentrating; Irritability; Restlessness ?  ?Psychosis:   ?Delusions ?  ?Duration of Psychotic symptoms:  ?Greater than six months ?  ?Trauma:   ?N/A ?  ?Obsessions:   ?Poor insight; Recurrent & persistent thoughts/impulses/images ?  ?Compulsions:   ?Absent insight/delusional; Poor Insight ?  ?Inattention:   ?N/A ?  ?Hyperactivity/Impulsivity:   ?N/A ?  ?Oppositional/Defiant Behaviors:   ?Aggression towards people/animals; Easily annoyed ?  ?Emotional Irregularity:   ?Potentially harmful impulsivity; Mood lability ?  ?Other Mood/Personality Symptoms:  No data recorded  ? ?Mental Status Exam ?Appearance and self-care  ?Stature:   ?Average ?  ?Weight:   ?Average weight ?  ?Clothing:   ?-- (In scrubs) ?  ?Grooming:   ?Neglected ?  ?Cosmetic use:   ?None ?  ?Posture/gait:   ?Normal ?  ?Motor activity:   ?Restless ?  ?Sensorium  ?Attention:   ?Distractible ?  ?Concentration:   ?Scattered ?  ?Orientation:   ?Place; Situation ?  ?Recall/memory:   ?Defective in Remote; Defective in Recent ?  ?Affect and Mood  ?Affect:   ?Labile; Anxious ?  ?Mood:   ?Euphoric; Hypomania; Irritable; Anxious ?  ?Relating  ?Eye contact:   ?Normal ?  ?Facial expression:   ?Anxious ?  ?Attitude toward examiner:   ?Cooperative ?  ?Thought and Language  ?Speech flow:  ?Flight of Ideas; Profane ?  ?Thought content:   ?  Delusions ?  ?Preoccupation:   ?Obsessions ?  ?Hallucinations:   ?None ?  ?Organization:  No data recorded  ?Executive Functions  ?Fund of Knowledge:   ?Poor ?  ?Intelligence:   ?Average ?  ?Abstraction:   ?Functional ?  ?Judgement:   ?Poor ?  ?Reality Testing:   ?Distorted ?  ?Insight:   ?Lacking; Poor ?  ?Decision Making:   ?Impulsive ?  ?Social Functioning  ?Social Maturity:   ?Impulsive ?  ?Social Judgement:   ?Heedless ?  ?Stress  ?Stressors:   ?Other (Comment) ?  ?Coping Ability:   ?Exhausted ?  ?Skill Deficits:   ?Self-control; Decision making ?  ?Supports:   ?Family;  Support needed ?  ? ? ?Religion: ?Religion/Spirituality ?Are You A Religious Person?: No ?How Might This Affect Treatment?: n/a ? ?Leisure/Recreation: ?Leisure / Recreation ?Do You Have Hobbies?: No ? ?Exercise/Diet:

## 2021-04-29 NOTE — ED Provider Notes (Signed)
? ?[facility] ?Provider Note ? ? ? Event Date/Time  ? First MD Initiated Contact with Patient 04/28/21 2252   ?  (approximate) ? ? ?History  ? ?Manic Behavior ? ? ?HPI ? ?Austin Rhodes is a 71 y.o. male with a history of bipolar disorder, psychosis, paranoid schizophrenia who is brought in by police under IVC for psychosis.  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  According to IVC papers he has not been taking his medications.  It is unclear who called PD ?  ? ? ?Past Medical History:  ?Diagnosis Date  ? Bipolar disorder (HCC)   ? Coronary artery disease   ? GERD (gastroesophageal reflux disease)   ? History of multiple strokes   ? PTSD (post-traumatic stress disorder)   ? ? ?Past Surgical History:  ?Procedure Laterality Date  ? CARDIAC CATHETERIZATION    ? ? ? ?Physical Exam  ? ?Triage Vital Signs: ?ED Triage Vitals [04/28/21 2236]  ?Enc Vitals Group  ?   BP (!) 164/102  ?   Pulse Rate (!) 137  ?   Resp 20  ?   Temp 99 ?F (37.2 ?C)  ?   Temp Source Oral  ?   SpO2 98 %  ?   Weight   ?   Height   ?   Head Circumference   ?   Peak Flow   ?   Pain Score   ?   Pain Loc   ?   Pain Edu?   ?   Excl. in GC?   ? ? ?Most recent vital signs: ?Vitals:  ? 04/28/21 2236  ?BP: (!) 164/102  ?Pulse: (!) 137  ?Resp: 20  ?Temp: 99 ?F (37.2 ?C)  ?SpO2: 98%  ? ? ?General: No apparent distress ?HEENT: moist mucous membranes ?CV: RRR ?Pulm: Normal WOB ?GI: soft and non tender ?MSK: no edema or cyanosis ?Neuro: face symmetric, moving all extremities ?Psych: Agitated with severe psychosis ? ? ?ED Results / Procedures / Treatments  ? ?Labs ?(all labs ordered are listed, but only abnormal results are displayed) ?Labs Reviewed  ?COMPREHENSIVE METABOLIC PANEL -  Abnormal; Notable for the following components:  ?    Result Value  ? Potassium 3.2 (*)   ? CO2 17 (*)   ? Glucose, Bld 170 (*)   ? Creatinine, Ser 1.51 (*)   ? GFR, Estimated 50 (*)   ? Anion gap 20 (*)   ? All other components within normal limits  ?SALICYLATE LEVEL - Abnormal; Notable for the following components:  ? Salicylate Lvl <7.0 (*)   ? All other components within normal limits  ?ACETAMINOPHEN LEVEL - Abnormal; Notable for the following components:  ? Acetaminophen (Tylenol), Serum <10 (*)   ? All other components within normal limits  ?CBC - Abnormal; Notable for the following components:  ? WBC 10.9 (*)   ? All other components within normal limits  ?RESP PANEL BY RT-PCR (FLU A&B, COVID) ARPGX2  ?ETHANOL  ?URINE DRUG SCREEN, QUALITATIVE ([facility] ONLY)  ?URINALYSIS, COMPLETE (UACMP) WITH MICROSCOPIC  ?BASIC METABOLIC PANEL  ? ? ? ?EKG ? ?none ? ? ?RADIOLOGY ?none ? ? ?PROCEDURES: ? ?Critical Care performed:  Yes, see critical care procedure note(s) ? ?.Critical Care ?Performed by: Nita Sickle, MD ?Authorized by: Nita Sickle, MD  ? ?Critical care provider statement:  ?  Critical care time (minutes):  30 ?  Critical care time was exclusive of:  Separately billable procedures and treating other patients ?  Critical care was necessary to treat or prevent imminent or life-threatening deterioration of the following conditions:  Dehydration, CNS failure or compromise, toxidrome and trauma ?  Critical care was time spent personally by me on the following activities:  Development of treatment plan with patient or surrogate, discussions with consultants, evaluation of patient's response to treatment, examination of patient, ordering and review of laboratory studies, ordering and review of radiographic studies, ordering and performing treatments and interventions, pulse oximetry, re-evaluation of patient's condition and review of old charts ?  I assumed direction of critical care for this patient from  another provider in my specialty: no   ?  Care discussed with: admitting provider   ? ? ? ?IMPRESSION / MDM / ASSESSMENT AND PLAN / ED COURSE  ?I reviewed the triage vital signs and the nursing notes. ? ? 71 y.o. male with a history of bipolar disorder, psychosis, paranoid schizophrenia who is brought in by police under IVC for psychosis.  Patient is extremely psychotic on arrival to the emergency room, agitated and aggressive.  We tried to reorient patient several times unsuccessfully.  He started walking out of the room and going to other patients rooms.  For patient, staff, and other patients safety patient was given IM Haldol with no significant improvement.  Then he was given a dose of IV Ativan.  Patient is now resting peacefully.  We will maintain IVC papers.  Psychiatry has been consulted.  Labs for medical clearance showing an anion gap of 20 and an AKI most likely from not eating or drinking.  Alcohol level is negative, salicylate and acetaminophen levels are negative.  CBC is within normal limits.  UDS is pending.  Patient is receiving IV fluids. ? ? ? ?_________________________ ?6:23 AM on 04/29/2021 ?----------------------------------------- ?Patient has slept peacefully throughout the night.  Has been receiving IV hydration throughout the night.  Plan to repeat a BMP this morning to make sure anion gap has closed.  Patient pending psychiatric evaluation.  Care transferred to incoming MD at 7 AM ? ? ? ?MEDICATIONS GIVEN IN ED: ?Medications  ?lactated ringers infusion ( Intravenous New Bag/Given 04/29/21 0242)  ?haloperidol lactate (HALDOL) injection 2 mg (2 mg Intramuscular Given 04/28/21 2341)  ?LORazepam (ATIVAN) injection 2 mg (2 mg Intravenous Given 04/29/21 0008)  ?lactated ringers bolus 1,000 mL (0 mLs Intravenous Stopped 04/29/21 0148)  ? ? ?Consults: psychiatry ? ? ?EMR reviewed including patient's last admission for acute psychosis from February 2022 ? ? ? ?FINAL CLINICAL IMPRESSION(S) / ED  DIAGNOSES  ? ?Final diagnoses:  ?Psychosis, unspecified psychosis type (HCC)  ? ? ? ?Rx / DC Orders  ? ?ED Discharge Orders   ? ? None  ? ?  ? ? ? ?Note:  This document was prepared using Dragon voice recognition software and may include unintentional dictation errors. ? ? ?Please note:  Patient was evaluated in Emergency Department today for the symptoms described in the history of present illness. Patient was evaluated in the context of the global COVID-19 pandemic, which necessitated consideration that the patient might be at risk for infection with the SARS-CoV-2 virus that causes COVID-19. Institutional protocols and algorithms that pertain to the evaluation of  patients at risk for COVID-19 are in a state of rapid change based on information released by regulatory bodies including the CDC and federal and state organizations. These policies and algorithms were followed during the patient's care in the ED.  Some ED evaluations and interventions may be delayed as a result of limited staffing during the pandemic. ? ? ? ? ?  ?Nita SickleVeronese, [place], MD ?04/29/21 [id number] ? ?
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
## 2021-04-29 NOTE — ED Notes (Signed)
Pt increasingly agitated and will not stay in room. Pt yelling at officer and states "you listen to me not I listen to you". Unable to be verbally deescalated. Medications ordered at this time.  ?

## 2021-04-29 NOTE — ED Notes (Signed)
Pt calmly speaking with officer in the quad. ?

## 2021-04-29 NOTE — Consult Note (Signed)
[facility] Face-to-Face Psychiatry Consult  ? ?Reason for Consult: Mania ?Referring Physician: UDP ?Patient Identification: Austin Rhodes ?MRN:  [id number] ?Principal Diagnosis: Bipolar I disorder, current or most recent episode manic, with psychotic features (HCC) ?Diagnosis:  Principal Problem: ?  Bipolar I disorder, current or most recent episode manic, with psychotic features (HCC) ? ? ?Total Time spent with patient: 30 minutes ? ?Subjective:   ?Austin Rhodes is a 71 y.o. male patient admitted with "I am an [place] the [employer]." ? ?HPI:  HPI: Patient seen and chart was reviewed.  Patient states that he is here because there was a "drug raid" at his apartment building.  He is speaking in nonsensical, disorganized terms.  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  He is not aggressive.  Per bedside RN he took his medications.  He admits that he does have bipolar illness.  He says he takes "too much" medicine.  He states that maybe he "drank too much", however BAL is less than 10.  UDS is negative.  Patient clearly delusional, needs hospitalization for safety and stabilization, treatment. ? ?Past Psychiatric History: Bipolar 1 disorder ? ?Risk to Self:   ?Risk to Others:   ?Prior Inpatient Therapy:   ?Prior Outpatient Therapy:   ? ?Past Medical History:  ?Past Medical History:  ?Diagnosis Date  ? Bipolar disorder (HCC)   ? Coronary artery disease   ? GERD (gastroesophageal reflux disease)   ? History of multiple strokes   ? PTSD (post-traumatic stress disorder)   ?  ?Past Surgical History:  ?Procedure Laterality Date  ? CARDIAC CATHETERIZATION    ? ?Family History: History reviewed. No pertinent family history. ?Family Psychiatric  History: Unknown ?Social History:  ?Social History  ? ?Substance and Sexual Activity  ?Alcohol Use No  ?   ?Social History  ? ?Substance and Sexual Activity  ?Drug Use Never  ?  ?Social History  ? ?Socioeconomic History  ? Marital status:  Divorced  ?  Spouse name: Not on file  ? Number of children: Not on file  ? Years of education: Not on file  ? Highest education level: Not on file  ?Occupational History  ? Not on file  ?Tobacco Use  ? Smoking status: Never  ? Smokeless tobacco: Never  ?Vaping Use  ? Vaping Use: Never used  ?Substance and Sexual Activity  ? Alcohol use: No  ? Drug use: Never  ? Sexual activity: Not on file  ?Other Topics Concern  ? Not on file  ?Social History Narrative  ? Not on file  ? ?Social Determinants of Health  ? ?Financial Resource Strain: Not on file  ?Food Insecurity: Not on file  ?Transportation Needs: Not on file  ?Physical Activity: Not on file  ?Stress: Not on file  ?Social Connections: Not on file  ? ?Additional Social History: ?  ? ?Allergies:   ?Allergies  ?Allergen Reactions  ? Procaine Other (See Comments)  ? ? ?Labs:  ?Results for orders placed or performed during the hospital encounter of 04/28/21 (from the past 48 hour(s))  ?Comprehensive metabolic panel     Status: Abnormal  ? Collection Time: 04/28/21 10:42 PM  ?Result Value Ref Range  ? Sodium 141 135 - 145 mmol/L  ? Potassium 3.2 (L) 3.5 - 5.1 mmol/L  ? Chloride 104 98 - 111 mmol/L  ? CO2 17 (L) 22 - 32 mmol/L  ? Glucose, Bld 170 (H) 70 - 99 mg/dL  ?  Comment:  Glucose reference range applies only to samples taken after fasting for at least 8 hours.  ? BUN 13 8 - 23 mg/dL  ? Creatinine, Ser 1.51 (H) 0.61 - 1.24 mg/dL  ? Calcium 9.1 8.9 - 10.3 mg/dL  ? Total Protein 7.4 6.5 - 8.1 g/dL  ? Albumin 4.1 3.5 - 5.0 g/dL  ? AST 35 15 - 41 U/L  ? ALT 14 0 - 44 U/L  ? Alkaline Phosphatase 61 38 - 126 U/L  ? Total Bilirubin 0.8 0.3 - 1.2 mg/dL  ? GFR, Estimated 50 (L) >60 mL/min  ?  Comment: (NOTE) ?Calculated using the CKD-EPI Creatinine Equation (2021) ?  ? Anion gap 20 (H) 5 - 15  ?  Comment: Performed at [facility], [address] [place], [place] [address]  ?Ethanol     Status: None  ? Collection Time: 04/28/21 10:42 PM  ?Result Value Ref Range  ?  Alcohol, Ethyl (B) <10 <10 mg/dL  ?  Comment: (NOTE) ?Lowest detectable limit for serum alcohol is 10 mg/dL. ? ?For medical purposes only. ?Performed at [facility], [address] [place], ?[place] [address] ?  ?Salicylate level     Status: Abnormal  ? Collection Time: 04/28/21 10:42 PM  ?Result Value Ref Range  ? Salicylate Lvl <7.0 (L) 7.0 - 30.0 mg/dL  ?  Comment: Performed at [facility], [address] [place], [place] [address]  ?Acetaminophen level     Status: Abnormal  ? Collection Time: 04/28/21 10:42 PM  ?Result Value Ref Range  ? Acetaminophen (Tylenol), Serum <10 (L) 10 - 30 ug/mL  ?  Comment: (NOTE) ?Therapeutic concentrations vary significantly. A range of 10-30 ug/mL  ?may be an effective concentration for many patients. However, some  ?are best treated at concentrations outside of this range. ?Acetaminophen concentrations >150 ug/mL at 4 hours after ingestion  ?and >50 ug/mL at 12 hours after ingestion are often associated with  ?toxic reactions. ? ?Performed at [facility], [address] [place], ?[place] [address] ?  ?cbc     Status: Abnormal  ? Collection Time: 04/28/21 10:42 PM  ?Result Value Ref Range  ? WBC 10.9 (H) 4.0 - 10.5 K/uL  ? RBC 5.25 4.22 - 5.81 MIL/uL  ? Hemoglobin 15.8 13.0 - 17.0 g/dL  ? HCT 48.6 39.0 - 52.0 %  ? MCV 92.6 80.0 - 100.0 fL  ? MCH 30.1 26.0 - 34.0 pg  ? MCHC 32.5 30.0 - 36.0 g/dL  ? RDW 13.0 11.5 - 15.5 %  ? Platelets 215 150 - 400 K/uL  ? nRBC 0.0 0.0 - 0.2 %  ?  Comment: Performed at [facility], [address] [place], [place] [address]  ?Hemoglobin A1c     Status: None  ? Collection Time: 04/28/21 10:42 PM  ?Result Value Ref Range  ? Hgb A1c MFr Bld 5.3 4.8 - 5.6 %  ?  Comment: (NOTE) ?Pre diabetes:          5.7%-6.4% ? ?Diabetes:              >6.4% ? ?Glycemic control for   <7.0% ?adults with diabetes ?  ? Mean Plasma Glucose 105.41 mg/dL  ?  Comment: Performed at [facility], [address]  [place], [place] [address]  ?Urine Drug Screen, Qualitative     Status: None  ? Collection Time: 04/29/21  5:56 AM  ?Result Value Ref Range  ? Tricyclic, Ur Screen NONE  DETECTED NONE DETECTED  ? Amphetamines, Ur Screen NONE DETECTED NONE DETECTED  ? MDMA (Ecstasy)Ur Screen NONE DETECTED NONE DETECTED  ? Cocaine Metabolite,Ur [place] NONE DETECTED NONE DETECTED  ? Opiate, Ur Screen NONE DETECTED NONE DETECTED  ? Phencyclidine (PCP) Ur S NONE DETECTED NONE DETECTED  ? Cannabinoid 50 Ng, Ur [place] NONE DETECTED NONE DETECTED  ? Barbiturates, Ur Screen NONE DETECTED NONE DETECTED  ? Benzodiazepine, Ur Scrn NONE DETECTED NONE DETECTED  ? Methadone Scn, Ur NONE DETECTED NONE DETECTED  ?  Comment: (NOTE) ?Tricyclics + metabolites, urine    Cutoff 1000 ng/mL ?Amphetamines + metabolites, urine  Cutoff 1000 ng/mL ?MDMA (Ecstasy), urine              Cutoff 500 ng/mL ?Cocaine Metabolite, urine          Cutoff 300 ng/mL ?Opiate + metabolites, urine        Cutoff 300 ng/mL ?Phencyclidine (PCP), urine         Cutoff 25 ng/mL ?Cannabinoid, urine                 Cutoff 50 ng/mL ?Barbiturates + metabolites, urine  Cutoff 200 ng/mL ?Benzodiazepine, urine              Cutoff 200 ng/mL ?Methadone, urine                   Cutoff 300 ng/mL ? ?The urine drug screen provides only a preliminary, unconfirmed ?analytical test result and should not be used for non-medical ?purposes. Clinical consideration and professional judgment should ?be applied to any positive drug screen result due to possible ?interfering substances. A more specific alternate chemical method ?must be used in order to obtain a confirmed analytical result. ?Gas chromatography / mass spectrometry (GC/MS) is the preferred ?confirm atory method. ?Performed at [facility], [address] [place], ?[place] [address] ?  ?Urinalysis, Complete w Microscopic     Status: Abnormal  ? Collection Time: 04/29/21  5:56 AM  ?Result Value Ref Range  ? Color, Urine YELLOW (A) YELLOW  ?  APPearance CLEAR (A) CLEAR  ? Specific Gravity, Urine 1.010 1.005 - 1.030  ? pH 6.0 5.0 - 8.0  ? Glucose, UA NEGATIVE NEGATIVE mg/dL  ? Hgb urine dipstick NEGATIVE NEGATIVE  ? Bilirubin Urine NEGATIVE NEGATIVE  ? Ketones,

## 2021-04-29 NOTE — ED Notes (Signed)
Pt sleeping  iv fluids infusing.  

## 2021-04-29 NOTE — ED Notes (Signed)
Pt given water 

## 2021-04-29 NOTE — ED Notes (Signed)
Pt discharged under IVC to gero psych unit.  Pt cooperative.  Belongings sent with patient.  VS stable.  ?

## 2021-04-29 NOTE — Progress Notes (Signed)
Pt standing at nurses' station; calm, cooperative. Pt states that he feels "pretty good". He denies pain, SI/HI/AVH, anxiety and depression at this time. He describes his sleep as "great" and his appetite as "good". Pt states that he was admitted to the psychiatric unit because "They had a drug bust at my apartment. There were 5 cars dropping off fentanyl. I was going to give everybody $10,000 to move out. I was going to give the children $150 for toys." Admission status (IVC) and reason for being committed reviewed with patient. Pt continued to display delusions of grandeur and saying among other things, that he has international followers and insisting that [place] judges and state officials be contacted so that he can leave the hospital because they know him. Explained to pt that he has been IVC'd and cannot be discharged to go home at this time; he is unable to comprehend his admission status. No acute distress noted ?

## 2021-04-29 NOTE — Progress Notes (Signed)
Recreation Therapy Notes ? ?Date: 04/29/2021 ?  ?Time: 1:30pm  ?  ?Location: Day room  ?  ?Behavioral response: N/A ?  ?Intervention Topic: Problem Solving   ?  ?Discussion/Intervention: ?Patient refused to attend group. ?  ?Clinical Observations/Feedback: ?Patient refused to attend group. ?  ?Austin Rhodes LRT/CTRS ? ? ? ? ? ? ? ?Austin Rhodes ?04/29/2021 1:58 PM ?

## 2021-04-29 NOTE — Progress Notes (Signed)
Patient admitted IVC to [facility] from [facility] ED with diagnosis of Bipolar 1. Patient presents to unit via WC A&Ox4. Patient states, "I really don't know why I even need to be here.  I'm not crazy." Patient's affect is agitated, speech is pressured and thoughts are disorganized. Patient currently denies suicidal ideations, homicidal ideations, audio or visual hallucinations and verbally contracts for safety on unit. Patient is delusional believes he his a Clinical research associate, doctor. He states Gibson Ramp is his POA. Denies pain. Denies incontinence and reports last BM yesterday.  Patient reports / denies smoking or drug abuse but does endorse ETOH use. Patient got upset with MHT and threw a bottle of water across the nurses station. Patient reports living alone. Emotional support and reassurance provided throughout admission intake. Afterwards, oriented patient to unit, room and call light, reviewed POC with all questions answered and understanding verbalzied.  Denies any needs at this time.  Will continue to monitor with ongoing Q 15 minute safety checks per unit protocol. ?

## 2021-04-29 NOTE — ED Notes (Signed)
RN called lab to have A1C added to previously collected blood work. Marland Kitchen  ?

## 2021-04-29 NOTE — Tx Team (Signed)
Initial Treatment Plan ?04/29/2021 ?2:22 PM ?Alyson Reedy Radi ?NTI:[id number] ? ? ? ?PATIENT STRESSORS: ?Other: Unable to assess due to manic state   ? ? ?PATIENT STRENGTHS: ?Active sense of humor  ?Communication skills  ? ? ?PATIENT IDENTIFIED PROBLEMS: ?None stated  ?  ?  ?  ?  ?  ?  ?  ?  ?  ? ?DISCHARGE CRITERIA:  ?Ability to meet basic life and health needs ?Improved stabilization in mood, thinking, and/or behavior ? ?PRELIMINARY DISCHARGE PLAN: ?Return to previous living arrangement ? ?PATIENT/FAMILY INVOLVEMENT: ?This treatment plan has been presented to and reviewed with the patient, Josef Tourigny Brumett. The patient and family have been given the opportunity to ask questions and make suggestions. ? ?Doneen Poisson, RN ?04/29/2021, 2:22 PM ?

## 2021-04-29 NOTE — BH Assessment (Signed)
Patient is to be admitted to [facility] today 04/29/21 by Dr.  Marlou Porch .  ?Attending Physician will be Dr.  Marlou Porch .   ?Patient has been assigned to room L36, by [facility] Charge Nurse Whitney.   ? ?ER staff is aware of the admission: ?Drinda Butts, ER Secretary   ?Dr. Katrinka Blazing, ER MD  ?Amy, Patient's Nurse  ?Sue Lush, Patient Access.  ?

## 2021-04-29 NOTE — ED Notes (Addendum)
Pt up from bed near door asking when he could leave. This rn told pt he could not leave at this time. 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 Pt redirected into room. ?

## 2021-04-30 DIAGNOSIS — F312 Bipolar disorder, current episode manic severe with psychotic features: Secondary | ICD-10-CM | POA: Diagnosis not present

## 2021-04-30 MED ORDER — CHLORPROMAZINE HCL 50 MG PO TABS
50.0000 mg | ORAL_TABLET | ORAL | Status: DC | PRN
  Administered 2021-05-01 – 2021-05-13 (×9): 50 mg via ORAL
  Filled 2021-04-30 (×10): qty 1

## 2021-04-30 MED ORDER — LORAZEPAM 1 MG PO TABS
1.0000 mg | ORAL_TABLET | ORAL | Status: DC | PRN
  Administered 2021-05-02 – 2021-05-05 (×5): 1 mg via ORAL
  Filled 2021-04-30 (×7): qty 1

## 2021-04-30 MED ORDER — CHLORPROMAZINE HCL 25 MG/ML IJ SOLN
50.0000 mg | INTRAMUSCULAR | Status: DC | PRN
  Administered 2021-05-01: 50 mg via INTRAMUSCULAR
  Filled 2021-04-30 (×4): qty 2

## 2021-04-30 NOTE — Progress Notes (Signed)
Patient is resting in bed. He got up and ambulated around the unit with assistance but he is unsteady on his feet. Patient is notably confused but denies A/V hallucinations. He also denies any SI/HI and is cooperative with unit instructions.  ?

## 2021-04-30 NOTE — Progress Notes (Signed)
Patient is in bed asleep. Respirations even and unlabored. Patient remains safe on the unit at this time. ?

## 2021-04-30 NOTE — BHH Group Notes (Signed)
Per Dr. Lazaro Arms not wake pt- no group offered  ?

## 2021-04-30 NOTE — BHH Group Notes (Signed)
Pt  rrefused group - delusions present  ? ?

## 2021-04-30 NOTE — Progress Notes (Signed)
Patient woke up and ate a snack and drank some ginger ale. He took some of his AM medications but refused the Potassium and Depakote, Dr. Louis Meckel made aware. Assisted patient with changing clothes, wiping himself down, and changing his bed linens. Patient is now up at the phones making phone calls. Patient is cooperative at present with staff but continues to express ideation that he is a doctor and some other delusional thoughts.  ?

## 2021-04-30 NOTE — BH IP Treatment Plan (Signed)
Interdisciplinary Treatment and Diagnostic Plan Update ? ?04/30/2021 ?Time of Session: 10:00AM ?Austin Rhodes ?MRN: [id number] ? ?Principal Diagnosis: Bipolar I disorder, current or most recent episode manic, with psychotic features ([place]) ? ?Secondary Diagnoses: Principal Problem: ?  Bipolar I disorder, current or most recent episode manic, with psychotic features ([place]) ?Active Problems: ?  Severe manic bipolar 1 disorder with psychotic behavior ([place]) ?  Bipolar 1 disorder ([place]) ? ? ?Current Medications:  ?Current Facility-Administered Medications  ?Medication Dose Route Frequency Provider Last Rate Last Admin  ? acetaminophen (TYLENOL) tablet 650 mg  650 mg Oral Q6H PRN Parks Ranger, DO      ? albuterol (PROVENTIL) (2.5 MG/3ML) 0.083% nebulizer solution 3 mL  3 mL Inhalation Q4H PRN Parks Ranger, DO      ? alum & mag hydroxide-simeth (MAALOX/MYLANTA) 200-200-20 MG/5ML suspension 30 mL  30 mL Oral Q4H PRN Parks Ranger, DO      ? chlorproMAZINE (THORAZINE) tablet 50 mg  50 mg Oral Q4H PRN Parks Ranger, DO      ? Or  ? chlorproMAZINE (THORAZINE) injection 50 mg  50 mg Intramuscular Q4H PRN Parks Ranger, DO      ? clopidogrel (PLAVIX) tablet 75 mg  75 mg Oral Daily Parks Ranger, DO   75 mg at 04/30/21 1416  ? divalproex (DEPAKOTE ER) 24 hr tablet 500 mg  500 mg Oral TID Parks Ranger, DO   500 mg at 04/29/21 2238  ? LORazepam (ATIVAN) tablet 1 mg  1 mg Oral Q4H PRN Parks Ranger, DO      ? losartan (COZAAR) tablet 50 mg  50 mg Oral Daily Parks Ranger, DO   50 mg at 04/30/21 1416  ? magnesium hydroxide (MILK OF MAGNESIA) suspension 30 mL  30 mL Oral Daily PRN Waldon Merl F, NP      ? potassium chloride SA (KLOR-CON M) CR tablet 20 mEq  20 mEq Oral Daily Parks Ranger, DO      ? QUEtiapine (SEROQUEL) tablet 400 mg  400 mg Oral QHS Parks Ranger, DO   400 mg at 04/29/21 2241  ? rosuvastatin  (CRESTOR) tablet 10 mg  10 mg Oral Daily Parks Ranger, DO   10 mg at 04/30/21 1417  ? traZODone (DESYREL) tablet 100 mg  100 mg Oral QHS PRN Parks Ranger, DO   100 mg at 04/29/21 2241  ? ?PTA Medications: ?Medications Prior to Admission  ?Medication Sig Dispense Refill Last Dose  ? acetaminophen (TYLENOL) 325 MG tablet Take 2 tablets (650 mg total) by mouth every 6 (six) hours as needed for mild pain or moderate pain. 30 tablet 0   ? albuterol (VENTOLIN HFA) 108 (90 Base) MCG/ACT inhaler Inhale 1-2 puffs into the lungs every 4 (four) hours as needed for wheezing or shortness of breath. 1 each 0   ? amoxicillin-clavulanate (AUGMENTIN) 875-125 MG tablet Take 1 tablet by mouth 2 (two) times daily. X 7 days 14 tablet 0   ? clopidogrel (PLAVIX) 75 MG tablet Take 1 tablet (75 mg total) by mouth daily. 30 tablet 1   ? divalproex (DEPAKOTE ER) 500 MG 24 hr tablet Take 500 mg by mouth 3 (three) times daily.     ? divalproex (DEPAKOTE) 500 MG DR tablet Take 1 tablet (500 mg total) by mouth every morning AND 2 tablets (1,000 mg total) at bedtime. 90 tablet 1   ? fluticasone (FLONASE) 50 MCG/ACT nasal spray  Place 2 sprays into both nostrils daily. 16 g 0   ? hydrOXYzine (ATARAX/VISTARIL) 50 MG tablet Take 1 tablet (50 mg total) by mouth 3 (three) times daily as needed for anxiety. 30 tablet 1   ? lamoTRIgine (LAMICTAL) 25 MG tablet Take by mouth.     ? Lamotrigine 35 x 25 MG KIT Take 25-100 mg by mouth as directed. 1 kit 0   ? metoprolol succinate (TOPROL-XL) 25 MG 24 hr tablet Take 25 mg by mouth daily.     ? QUEtiapine (SEROQUEL) 400 MG tablet Take 1 tablet (400 mg total) by mouth at bedtime. 30 tablet 1   ? rosuvastatin (CRESTOR) 10 MG tablet Take 1 tablet (10 mg total) by mouth daily. 30 tablet 1   ? Spacer/Aero-Holding Chambers (AEROCHAMBER PLUS) inhaler Use with inhaler 1 each 2   ? tamsulosin (FLOMAX) 0.4 MG CAPS capsule Take 1 capsule (0.4 mg total) by mouth daily after supper. 30 capsule 1    ? ? ?Patient Stressors: Other: Unable to assess due to manic state   ? ?Patient Strengths: Active sense of humor  ?Communication skills  ? ?Treatment Modalities: Medication Management, Group therapy, Case management,  ?1 to 1 session with clinician, Psychoeducation, Recreational therapy. ? ? ?Physician Treatment Plan for Primary Diagnosis: Bipolar I disorder, current or most recent episode manic, with psychotic features ([place]) ?Long Term Goal(s): Improvement in symptoms so as ready for discharge  ? ?Short Term Goals: Ability to identify changes in lifestyle to reduce recurrence of condition will improve ?Ability to verbalize feelings will improve ?Ability to disclose and discuss suicidal ideas ?Ability to demonstrate self-control will improve ?Ability to identify and develop effective coping behaviors will improve ?Ability to maintain clinical measurements within normal limits will improve ?Compliance with prescribed medications will improve ?Ability to identify triggers associated with substance abuse/mental health issues will improve ? ?Medication Management: Evaluate patient's response, side effects, and tolerance of medication regimen. ? ?Therapeutic Interventions: 1 to 1 sessions, Unit Group sessions and Medication administration. ? ?Evaluation of Outcomes: Not Met ? ?Physician Treatment Plan for Secondary Diagnosis: Principal Problem: ?  Bipolar I disorder, current or most recent episode manic, with psychotic features ([place]) ?Active Problems: ?  Severe manic bipolar 1 disorder with psychotic behavior ([place]) ?  Bipolar 1 disorder ([place]) ? ?Long Term Goal(s): Improvement in symptoms so as ready for discharge  ? ?Short Term Goals: Ability to identify changes in lifestyle to reduce recurrence of condition will improve ?Ability to verbalize feelings will improve ?Ability to disclose and discuss suicidal ideas ?Ability to demonstrate self-control will improve ?Ability to identify and develop effective coping behaviors  will improve ?Ability to maintain clinical measurements within normal limits will improve ?Compliance with prescribed medications will improve ?Ability to identify triggers associated with substance abuse/mental health issues will improve    ? ?Medication Management: Evaluate patient's response, side effects, and tolerance of medication regimen. ? ?Therapeutic Interventions: 1 to 1 sessions, Unit Group sessions and Medication administration. ? ?Evaluation of Outcomes: Not Met ? ? ?RN Treatment Plan for Primary Diagnosis: Bipolar I disorder, current or most recent episode manic, with psychotic features ([place]) ?Long Term Goal(s): Knowledge of disease and therapeutic regimen to maintain health will improve ? ?Short Term Goals: Ability to remain free from injury will improve, Ability to verbalize frustration and anger appropriately will improve, Ability to demonstrate self-control, Ability to participate in decision making will improve, Ability to verbalize feelings will improve, Ability to identify and develop effective coping behaviors  will improve, and Compliance with prescribed medications will improve ? ?Medication Management: RN will administer medications as ordered by provider, will assess and evaluate patient's response and provide education to patient for prescribed medication. RN will report any adverse and/or side effects to prescribing provider. ? ?Therapeutic Interventions: 1 on 1 counseling sessions, Psychoeducation, Medication administration, Evaluate responses to treatment, Monitor vital signs and CBGs as ordered, Perform/monitor CIWA, COWS, AIMS and Fall Risk screenings as ordered, Perform wound care treatments as ordered. ? ?Evaluation of Outcomes: Not Met ? ? ?LCSW Treatment Plan for Primary Diagnosis: Bipolar I disorder, current or most recent episode manic, with psychotic features ([place]) ?Long Term Goal(s): Safe transition to appropriate next level of care at discharge, Engage patient in therapeutic  group addressing interpersonal concerns. ? ?Short Term Goals: Engage patient in aftercare planning with referrals and resources, Increase social support, Increase ability to appropriately verbalize feelings, Inc

## 2021-04-30 NOTE — H&P (Signed)
Psychiatric Admission Assessment Adult ? ?Patient Identification: Austin Rhodes ?MRN:  [id number] ?Date of Evaluation:  04/30/2021 ?Chief Complaint:  Severe manic bipolar 1 disorder with psychotic behavior (HCC) [F31.2] ?Bipolar 1 disorder (HCC) [F31.9] ?Principal Diagnosis: Bipolar I disorder, current or most recent episode manic, with psychotic features (HCC) ?Diagnosis:  Principal Problem: ?  Bipolar I disorder, current or most recent episode manic, with psychotic features (HCC) ?Active Problems: ?  Severe manic bipolar 1 disorder with psychotic behavior (HCC) ?  Bipolar 1 disorder (HCC) ? ?History of Present Illness:   ?Austin AlstromMaurice is a 71 year old white male who was admitted to inpatient geriatric psychiatry for mania and delusions.  Apparently, his baseline is hypomanic.  He is a poor historian because he will not stop talking about his delusions and it is difficult to get any answers from him so most of this comes from the chart.  He speaks in somewhat of a threatening manner.  Almost like, he wants you to believe what he saying. ?Difficult to know whether he has been taking his medications. ? ?Pt presents via [place] [facility]- ivc. Pt with manic behavior & delusional. Pt irate when police bringing back to room. 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 I'm going to have to start narrowing down the pussies. I have many celebrities coming to my party, I have to get out of here". Pt with rapid speech. Pt not responding to questions appropriately.  ? ?Patient seen and chart was reviewed.  Patient states that he is here because there was a "drug raid" at his apartment building.  He is speaking in nonsensical, disorganized terms.  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  He is not aggressive.  Per bedside RN he took his medications.  He admits that he does have bipolar illness.  He says he takes "too much" medicine.  He  states that maybe he "drank too much", however BAL is less than 10.  UDS is negative.  Patient clearly delusional, needs hospitalization for safety and stabilization, treatment. ? ?Associated Signs/Symptoms: ?Depression Symptoms: None ?Duration of Depression Symptoms: No data recorded ?(Hypo) Manic Symptoms:  Distractibility, ?Elevated Mood, ?Flight of Ideas, ?Grandiosity, ?Hallucinations, ?Impulsivity, ?Irritable Mood, ?Anxiety Symptoms: None ?Psychotic Symptoms:  Delusions, ?Hallucinations: Auditory ?PTSD Symptoms: ?NA ?Total Time spent with patient: 1 hour ? ?Past Psychiatric History: History of bipolar disorder. Patient chronically hypomanic at baseline, but typically controlled to point he does not cause problems. No known history of suicide attempts or substance abuse.  ? ?Is the patient at risk to self? Yes.    ?Has the patient been a risk to self in the past 6 months? Yes.    ?Has the patient been a risk to self within the distant past? Yes.    ?Is the patient a risk to others? Yes.    ?Has the patient been a risk to others in the past 6 months? Yes.    ?Has the patient been a risk to others within the distant past? Yes.    ? ?Prior Inpatient Therapy:   ?Prior Outpatient Therapy:   ? ?Alcohol Screening: 1. How often do you have a drink containing alcohol?: 4 or more times a week ?2. How many drinks containing alcohol do you have on a typical day when you are drinking?: 1 or 2 ?3. How often do you have six or more drinks on one occasion?: Never ?AUDIT-C  Score: 4 ?4. How often during the last year have you found that you were not able to stop drinking once you had started?: Never ?5. How often during the last year have you failed to do what was normally expected from you because of drinking?: Never ?6. How often during the last year have you needed a first drink in the morning to get yourself going after a heavy drinking session?: Never ?7. How often during the last year have you had a feeling of guilt of  remorse after drinking?: Never ?8. How often during the last year have you been unable to remember what happened the night before because you had been drinking?: Never ?9. Have you or someone else been injured as a result of your drinking?: No ?10. Has a relative or friend or a doctor or another health worker been concerned about your drinking or suggested you cut down?: No ?Alcohol Use Disorder Identification Test Final Score (AUDIT): 4 ?Substance Abuse History in the last 12 months:  No. ?Consequences of Substance Abuse: ?NA ?Previous Psychotropic Medications: Yes  ?Psychological Evaluations: No  ?Past Medical History:  ?Past Medical History:  ?Diagnosis Date  ? Bipolar disorder (HCC)   ? Coronary artery disease   ? GERD (gastroesophageal reflux disease)   ? History of multiple strokes   ? PTSD (post-traumatic stress disorder)   ?  ?Past Surgical History:  ?Procedure Laterality Date  ? CARDIAC CATHETERIZATION    ? ?Family History: History reviewed. No pertinent family history. ?Family Psychiatric  History: Unknown ?Tobacco Screening:   ?Social History:  ?Social History  ? ?Substance and Sexual Activity  ?Alcohol Use No  ?   ?Social History  ? ?Substance and Sexual Activity  ?Drug Use Never  ?  ?Additional Social History: ?He has a daughter,Austin Rhodes.  I believe he lives alone in [place]. ?   ?  ?  ?  ?  ?  ?  ?  ?  ?  ?  ? ?Allergies:   ?Allergies  ?Allergen Reactions  ? Procaine Other (See Comments)  ? ?Lab Results:  ?Results for orders placed or performed during the hospital encounter of 04/28/21 (from the past 48 hour(s))  ?Comprehensive metabolic panel     Status: Abnormal  ? Collection Time: 04/28/21 10:42 PM  ?Result Value Ref Range  ? Sodium 141 135 - 145 mmol/L  ? Potassium 3.2 (L) 3.5 - 5.1 mmol/L  ? Chloride 104 98 - 111 mmol/L  ? CO2 17 (L) 22 - 32 mmol/L  ? Glucose, Bld 170 (H) 70 - 99 mg/dL  ?  Comment: Glucose reference range applies only to samples taken after fasting for at least 8 hours.  ? BUN 13 8 -  23 mg/dL  ? Creatinine, Ser 1.51 (H) 0.61 - 1.24 mg/dL  ? Calcium 9.1 8.9 - 10.3 mg/dL  ? Total Protein 7.4 6.5 - 8.1 g/dL  ? Albumin 4.1 3.5 - 5.0 g/dL  ? AST 35 15 - 41 U/L  ? ALT 14 0 - 44 U/L  ? Alkaline Phosphatase 61 38 - 126 U/L  ? Total Bilirubin 0.8 0.3 - 1.2 mg/dL  ? GFR, Estimated 50 (L) >60 mL/min  ?  Comment: (NOTE) ?Calculated using the CKD-EPI Creatinine Equation (2021) ?  ? Anion gap 20 (H) 5 - 15  ?  Comment: Performed at [facility], [address] [place], [place] [address]  ?Ethanol     Status: None  ? Collection Time: 04/28/21 10:42 PM  ?  Result Value Ref Range  ? Alcohol, Ethyl (B) <10 <10 mg/dL  ?  Comment: (NOTE) ?Lowest detectable limit for serum alcohol is 10 mg/dL. ? ?For medical purposes only. ?Performed at [facility], [address] [place], ?[place] [address] ?  ?Salicylate level     Status: Abnormal  ? Collection Time: 04/28/21 10:42 PM  ?Result Value Ref Range  ? Salicylate Lvl <7.0 (L) 7.0 - 30.0 mg/dL  ?  Comment: Performed at [facility], [address] [place], [place] [address]  ?Acetaminophen level     Status: Abnormal  ? Collection Time: 04/28/21 10:42 PM  ?Result Value Ref Range  ? Acetaminophen (Tylenol), Serum <10 (L) 10 - 30 ug/mL  ?  Comment: (NOTE) ?Therapeutic concentrations vary significantly. A range of 10-30 ug/mL  ?may be an effective concentration for many patients. However, some  ?are best treated at concentrations outside of this range. ?Acetaminophen concentrations >150 ug/mL at 4 hours after ingestion  ?and >50 ug/mL at 12 hours after ingestion are often associated with  ?toxic reactions. ? ?Performed at [facility], [address] [place], ?[place] [address] ?  ?cbc     Status: Abnormal  ? Collection Time: 04/28/21 10:42 PM  ?Result Value Ref Range  ? WBC 10.9 (H) 4.0 - 10.5 K/uL  ? RBC 5.25 4.22 - 5.81 MIL/uL  ? Hemoglobin 15.8 13.0 - 17.0 g/dL  ? HCT 48.6 39.0 - 52.0 %  ? MCV 92.6 80.0 - 100.0 fL  ? MCH 30.1  26.0 - 34.0 pg  ? MCHC 32.5 30.0 - 36.0 g/dL  ? RDW 13.0 11.5 - 15.5 %  ? Platelets 215 150 - 400 K/uL  ? nRBC 0.0 0.0 - 0.2 %  ?  Comment: Performed at [facility], [address] [place]

## 2021-04-30 NOTE — Plan of Care (Signed)
?  Problem: Education: ?Goal: Knowledge of [facility] Education information/materials will improve ?Outcome: Progressing ?Goal: Mental status will improve ?Outcome: Progressing ?  ?Problem: Health Behavior/Discharge Planning: ?Goal: Identification of resources available to assist in meeting health care needs will improve ?Outcome: Progressing ?Goal: Compliance with treatment plan for underlying cause of condition will improve ?Outcome: Progressing ?  ?Problem: Safety: ?Goal: Periods of time without injury will increase ?Outcome: Progressing ?  ?

## 2021-04-30 NOTE — Progress Notes (Signed)
Recreation Therapy Notes ? ?  ?Date: 04/30/2021 ?  ?Time: 1:15pm  ?  ?Location: Craft room   ?  ?Behavioral response: N/A ?  ?Intervention Topic: Self-care ? ?Discussion/Intervention: ?Patient refused to attend group. ?  ?Clinical Observations/Feedback: ?Patient refused to attend group. ?  ?Jamarco Zaldivar LRT/CTRS ? ? ? ? ? ? ? ?Mckyle Solanki ?04/30/2021 3:11 PM ?

## 2021-04-30 NOTE — [facility] Suicide Risk Assessment (Signed)
[facility] Admission Suicide Risk Assessment ? ? ?Nursing information obtained from:  Patient ?Demographic factors:  Age 71 or older, Male, Caucasian ?Current Mental Status:  NA ?Loss Factors:  NA ?Historical Factors:  Impulsivity ?Risk Reduction Factors:  NA ? ?Total Time spent with patient: 1 hour ?Principal Problem: Bipolar I disorder, current or most recent episode manic, with psychotic features ([place]) ?Diagnosis:  Principal Problem: ?  Bipolar I disorder, current or most recent episode manic, with psychotic features ([place]) ?Active Problems: ?  Severe manic bipolar 1 disorder with psychotic behavior ([place]) ?  Bipolar 1 disorder ([place]) ? ?Subjective Data: Pt presents via [place] [facility]- ivc. Pt with manic behavior & delusional. Pt irate when police bringing back to room. 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 I'm going to have to start narrowing down the pussies. I have many celebrities coming to my party, I have to get out of here". Pt with rapid speech. Pt not responding to questions appropriately.  ? ?Continued Clinical Symptoms:  ?Alcohol Use Disorder Identification Test Final Score (AUDIT): 4 ?The "Alcohol Use Disorders Identification Test", Guidelines for Use in Primary Care, Second Edition.  World Pharmacologist [facility]). ?Score between 0-7:  no or low risk or alcohol related problems. ?Score between 8-15:  moderate risk of alcohol related problems. ?Score between 16-19:  high risk of alcohol related problems. ?Score 20 or above:  warrants further diagnostic evaluation for alcohol dependence and treatment. ? ? ?CLINICAL FACTORS:  ? Currently Psychotic ? ? ?Musculoskeletal: ?Strength & Muscle Tone: within normal limits ?Gait & Station: normal ?Patient leans: N/A ? ?Psychiatric Specialty Exam: ? ?Presentation  ?General Appearance: Appropriate for Environment ? ?Eye Contact:Good ? ?Speech:Pressured ? ?Speech Volume:Normal ? ?Handedness:No data  recorded ? ?Mood and Affect  ?Mood:Anxious ? ?Affect:Congruent ? ? ?Thought Process  ?Thought Processes:Disorganized ? ?Descriptions of Associations:Tangential ? ?Orientation:Partial ? ?Thought Content:Delusions ? ?History of Schizophrenia/Schizoaffective disorder:No ? ?Duration of Psychotic Symptoms:Greater than six months ? ?Hallucinations:Hallucinations: None ? ?Ideas of Reference:Delusions ? ?Suicidal Thoughts:Suicidal Thoughts: No ? ?Homicidal Thoughts:Homicidal Thoughts: No ? ? ?Sensorium  ?Memory:Immediate Poor ? ?Judgment:Impaired ? ?Insight:Poor ? ? ?Executive Functions  ?Concentration:Poor ? ?Attention Span:Poor ? ?Recall:Poor ? ?[place] ? ?Language:Fair ? ? ?Psychomotor Activity  ?Psychomotor Activity:Psychomotor Activity: Normal ? ? ?Assets  ?Assets:Resilience ? ? ?Sleep  ?Sleep:No data recorded ? ? ?Blood pressure (!) 147/98, pulse (!) 109, temperature (!) 97.5 ?F (36.4 ?C), temperature source Oral, resp. rate 18, height 5\' 8"  (1.727 m), weight 76.2 kg, SpO2 99 %. Body mass index is 25.54 kg/m?. ? ? ?COGNITIVE FEATURES THAT CONTRIBUTE TO RISK:  ?Closed-mindedness   ? ?SUICIDE RISK:  ? Minimal: No identifiable suicidal ideation.  Patients presenting with no risk factors but with morbid ruminations; may be classified as minimal risk based on the severity of the depressive symptoms ? ?PLAN OF CARE: See orders ? ?I certify that inpatient services furnished can reasonably be expected to improve the patient's condition.  ? ?Parks Ranger, DO ?04/30/2021, 10:45 AM ? ?

## 2021-04-30 NOTE — BHH Counselor (Signed)
CSW made attempt to complete PSA with pt. Pt was sleeping deeply and could not be roused. CSW will make second attempt to complete PSA at another time.  ? ?Marly Schuld [place], MSW, LCSW-A ?3/29/20234:09 PM  ?
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
## 2021-05-01 DIAGNOSIS — F312 Bipolar disorder, current episode manic severe with psychotic features: Secondary | ICD-10-CM | POA: Diagnosis not present

## 2021-05-01 MED ORDER — LAMOTRIGINE 25 MG PO TABS
25.0000 mg | ORAL_TABLET | Freq: Two times a day (BID) | ORAL | Status: DC
Start: 2021-05-01 — End: 2021-05-06
  Administered 2021-05-01 – 2021-05-06 (×11): 25 mg via ORAL
  Filled 2021-05-01 (×11): qty 1

## 2021-05-01 NOTE — Progress Notes (Signed)
Patient up and ambulating around unit. Took his medications without incident. Patient ate his lunch and drank some water. Encouraged patient to take a shower and let him know we would provide him with clean clothes and supplies when ready. He denies any SI/HI ideation, plan or intent but continues to be delusional. He believes he owns the hospital and states he is giving [place] all raises. He is also requesting we call the news crew to come to the hospital. He is maintaining positive interactions at this time.  ?

## 2021-05-01 NOTE — Progress Notes (Signed)
[facility] LCSW Progress Note ?  ?05/01/2021    1:37 PM ? ?Austin Rhodes  ? ?[id number]  ? ?Type of Contact and Topic:  PSA Attempt   ? ?CSW attempted to complete PSA with patient at this time. Patient was observed asleep in his bed. Patient has been aggressive this morning and subsequently was not aroused for assessment.  ? ?CSW team will make additional attempt to complete PSA with patient.  ? ?  ?Signed:  ?Corky Crafts, MSW, LCSWA, LCAS ?05/01/2021 1:37 PM ?  ?  ?

## 2021-05-01 NOTE — Progress Notes (Signed)
Pt standing in hallway; cooperative, irritable. Pt states that he feels "disgusted" and states "I'm being held against my rights." He currently denies pain and SI/HI/AVH. He states "I slept a day and a half, I guess" when asked how he slept last night. He describes his appetite as "average". He denies depression at this time, but reports anxiety due to wanting to get his cell phone back. Pt reminded that patients are not allowed to have their cell phones. He refused his nighttime medications. No acute distress noted. ?

## 2021-05-01 NOTE — BHH Group Notes (Signed)
[facility] Group Notes:  (Nursing/MHT/Case Management/Adjunct) ? ?Date:  05/01/2021  ?Time:  10:00 AM ? ?Type of Therapy:  Psychoeducational Skills ? ?Participation Level:  Did Not Attend ? ?Participation Quality:   N/A ? ?Affect:   N/A ? ?Cognitive:   N/A ? ?Insight:  None ? ?Engagement in Group:   N/A ? ?Modes of Intervention:  N/A ? ?Summary of Progress/Problems: ?The pt was in the bed asleep during group. ?Barbaraann Rondo ?05/01/2021, 11:13 AM ?
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
## 2021-05-01 NOTE — Progress Notes (Signed)
Pt approached nurses' station making demands for staff to contact individuals so that he can leave. Pt instructed to return to his room; he complied. No acute distress noted. ?

## 2021-05-01 NOTE — Progress Notes (Signed)
[facility] MD Progress Note ? ?05/01/2021 12:08 PM ?Austin Rhodes  ?MRN:  [id number] ?Subjective: Austin Rhodes continues to be manic psychotic and delusional.  He required as needed medication this morning as the nursing note describes what happened below. ? ?Patient verbally and physically aggressive at the nursing station this morning. He attempted to throw a computer but staff intervened. Patient continues to demand to call his lawyer, patient continues to demand to call his daughter. Reoriented patient to phone call times. Patient ate his breakfast and went back to his room to lie down.  ? ?Principal Problem: Bipolar I disorder, current or most recent episode manic, with psychotic features (HCC) ?Diagnosis: Principal Problem: ?  Bipolar I disorder, current or most recent episode manic, with psychotic features (HCC) ?Active Problems: ?  Severe manic bipolar 1 disorder with psychotic behavior (HCC) ?  Bipolar 1 disorder (HCC) ? ?Total Time spent with patient: 15 minutes ? ?Past Psychiatric History: Extensive ? ?Past Medical History:  ?Past Medical History:  ?Diagnosis Date  ? Bipolar disorder (HCC)   ? Coronary artery disease   ? GERD (gastroesophageal reflux disease)   ? History of multiple strokes   ? PTSD (post-traumatic stress disorder)   ?  ?Past Surgical History:  ?Procedure Laterality Date  ? CARDIAC CATHETERIZATION    ? ?Family History: History reviewed. No pertinent family history. ? ?Social History:  ?Social History  ? ?Substance and Sexual Activity  ?Alcohol Use No  ?   ?Social History  ? ?Substance and Sexual Activity  ?Drug Use Never  ?  ?Social History  ? ?Socioeconomic History  ? Marital status: Divorced  ?  Spouse name: Not on file  ? Number of children: Not on file  ? Years of education: Not on file  ? Highest education level: Not on file  ?Occupational History  ? Not on file  ?Tobacco Use  ? Smoking status: Never  ? Smokeless tobacco: Never  ?Vaping Use  ? Vaping Use: Never used  ?Substance and Sexual  Activity  ? Alcohol use: No  ? Drug use: Never  ? Sexual activity: Not on file  ?Other Topics Concern  ? Not on file  ?Social History Narrative  ? Not on file  ? ?Social Determinants of Health  ? ?Financial Resource Strain: Not on file  ?Food Insecurity: Not on file  ?Transportation Needs: Not on file  ?Physical Activity: Not on file  ?Stress: Not on file  ?Social Connections: Not on file  ? ?Additional Social History:  ?  ?  ?  ?  ?  ?  ?  ?  ?  ?  ?  ? ?Sleep: Poor ? ?Appetite:  Fair ? ?Current Medications: ?Current Facility-Administered Medications  ?Medication Dose Route Frequency Provider Last Rate Last Admin  ? acetaminophen (TYLENOL) tablet 650 mg  650 mg Oral Q6H PRN Sarina Ill, DO   650 mg at 05/01/21 3710  ? albuterol (PROVENTIL) (2.5 MG/3ML) 0.083% nebulizer solution 3 mL  3 mL Inhalation Q4H PRN Sarina Ill, DO      ? alum & mag hydroxide-simeth (MAALOX/MYLANTA) 200-200-20 MG/5ML suspension 30 mL  30 mL Oral Q4H PRN Sarina Ill, DO      ? chlorproMAZINE (THORAZINE) tablet 50 mg  50 mg Oral Q4H PRN Sarina Ill, DO      ? Or  ? chlorproMAZINE (THORAZINE) injection 50 mg  50 mg Intramuscular Q4H PRN Sarina Ill, DO   50 mg at 05/01/21 6269  ?  clopidogrel (PLAVIX) tablet 75 mg  75 mg Oral Daily Sarina IllHerrick, Kymberley Raz Edward, DO   75 mg at 04/30/21 1416  ? divalproex (DEPAKOTE ER) 24 hr tablet 500 mg  500 mg Oral TID Sarina IllHerrick, Maile Linford Edward, DO   500 mg at 04/29/21 2238  ? LORazepam (ATIVAN) tablet 1 mg  1 mg Oral Q4H PRN Sarina IllHerrick, Abiha Lukehart Edward, DO      ? losartan (COZAAR) tablet 50 mg  50 mg Oral Daily Sarina IllHerrick, Arrie Borrelli Edward, DO   50 mg at 04/30/21 1416  ? magnesium hydroxide (MILK OF MAGNESIA) suspension 30 mL  30 mL Oral Daily PRN Gabriel CirriBarthold, Louise F, NP      ? potassium chloride SA (KLOR-CON M) CR tablet 20 mEq  20 mEq Oral Daily Sarina IllHerrick, Hasini Peachey Edward, DO      ? QUEtiapine (SEROQUEL) tablet 400 mg  400 mg Oral QHS Sarina IllHerrick, Audy Dauphine Edward, DO   400  mg at 04/29/21 2241  ? rosuvastatin (CRESTOR) tablet 10 mg  10 mg Oral Daily Sarina IllHerrick, Chozen Latulippe Edward, DO   10 mg at 04/30/21 1417  ? traZODone (DESYREL) tablet 100 mg  100 mg Oral QHS PRN Sarina IllHerrick, Cionna Collantes Edward, DO   100 mg at 04/29/21 2241  ? ? ?Lab Results:  ?Results for orders placed or performed during the hospital encounter of 04/28/21 (from the past 48 hour(s))  ?CBG monitoring, ED     Status: None  ? Collection Time: 04/29/21 12:10 PM  ?Result Value Ref Range  ? Glucose-Capillary 93 70 - 99 mg/dL  ?  Comment: Glucose reference range applies only to samples taken after fasting for at least 8 hours.  ? ? ?Blood Alcohol level:  ?Lab Results  ?Component Value Date  ? ETH <10 04/28/2021  ? ETH <10 03/13/2020  ? ? ?Metabolic Disorder Labs: ?Lab Results  ?Component Value Date  ? HGBA1C 5.3 04/28/2021  ? MPG 105.41 04/28/2021  ? MPG 111.15 03/15/2020  ? ?No results found for: PROLACTIN ?Lab Results  ?Component Value Date  ? CHOL 115 03/15/2020  ? TRIG 117 03/15/2020  ? HDL 52 03/15/2020  ? CHOLHDL 2.2 03/15/2020  ? VLDL 23 03/15/2020  ? LDLCALC 40 03/15/2020  ? LDLCALC 53 03/13/2020  ? ? ?Physical Findings: ?AIMS:  , ,  ,  ,    ?CIWA:    ?COWS:    ? ?Musculoskeletal: ?Strength & Muscle Tone: within normal limits ?Gait & Station: normal ?Patient leans: N/A ? ?Psychiatric Specialty Exam: ? ?Presentation  ?General Appearance: Appropriate for Environment ? ?Eye Contact:Good ? ?Speech:Pressured ? ?Speech Volume:Normal ? ?Handedness:No data recorded ? ?Mood and Affect  ?Mood:Anxious ? ?Affect:Congruent ? ? ?Thought Process  ?Thought Processes:Disorganized ? ?Descriptions of Associations:Tangential ? ?Orientation:Partial ? ?Thought Content:Delusions ? ?History of Schizophrenia/Schizoaffective disorder:No ? ?Duration of Psychotic Symptoms:Greater than six months ? ?Hallucinations:No data recorded ?Ideas of Reference:Delusions ? ?Suicidal Thoughts:No data recorded ?Homicidal Thoughts:No data recorded ? ?Sensorium   ?Memory:Immediate Poor ? ?Judgment:Impaired ? ?Insight:Poor ? ? ?Executive Functions  ?Concentration:Poor ? ?Attention Span:Poor ? ?Recall:Poor ? ?Fund of Knowledge:Fair ? ?Language:Fair ? ? ?Psychomotor Activity  ?Psychomotor Activity:No data recorded ? ?Assets  ?Assets:Resilience ? ? ?Sleep  ?Sleep:No data recorded ? ? ?Physical Exam: ?Physical Exam ?Vitals and nursing note reviewed.  ?Constitutional:   ?   Appearance: Normal appearance. He is normal weight.  ?Neurological:  ?   General: No focal deficit present.  ?   Mental Status: He is alert and oriented to person, place, and time.  ?Psychiatric:     ?  Attention and Perception: Attention and perception normal.     ?   Mood and Affect: Mood is elated. Affect is inappropriate.     ?   Speech: Speech is rapid and pressured and tangential.     ?   Behavior: Behavior is uncooperative, agitated, aggressive and combative.     ?   Thought Content: Thought content is delusional.     ?   Cognition and Memory: Cognition and memory normal.     ?   Judgment: Judgment is impulsive and inappropriate.  ? ?Review of Systems  ?Constitutional: Negative.   ?HENT: Negative.    ?Eyes: Negative.   ?Respiratory: Negative.    ?Cardiovascular: Negative.   ?Gastrointestinal: Negative.   ?Genitourinary: Negative.   ?Musculoskeletal: Negative.   ?Skin: Negative.   ?Neurological: Negative.   ?Endo/Heme/Allergies: Negative.   ?Psychiatric/Behavioral: Negative.    ?Blood pressure (!) 98/56, pulse 73, temperature 98.3 ?F (36.8 ?C), resp. rate 18, height 5\' 8"  (1.727 m), weight 76.2 kg, SpO2 100 %. Body mass index is 25.54 kg/m?. ? ? ?Treatment Plan Summary: ?Daily contact with patient to assess and evaluate symptoms and progress in treatment, Medication management, and Plan the daughter called and stated that he is also on Lamictal so we will restart that.  Continue current medications. ? ? , DO ?05/01/2021, 12:08 PM ? ?

## 2021-05-01 NOTE — Progress Notes (Signed)
Patient sleeping in bed at this time. No distress noted. Morning medications will be given when he wakes up, per Dr. Marlou Porch. Respirations even and non-labored.  ?

## 2021-05-01 NOTE — Progress Notes (Signed)
Recreation Therapy Notes ? ?Date: 05/01/2021 ? ?Time: 1:15pm   ? ?Location: Craft room  ? ?Behavioral response: N/A ?  ?Intervention Topic: Goals   ? ?Discussion/Intervention: ?Patient refused to attend group.  ? ?Clinical Observations/Feedback:  ?Patient refused to attend group.  ?  ?Oseias Horsey LRT/CTRS ? ? ? ? ? ? ? ?Jes Costales ?05/01/2021 3:14 PM ?

## 2021-05-01 NOTE — [facility] Counselor (Signed)
CSW talked with pt's son Yisroel Mullendore, [phone], who stated that pt is not able to go home due to him getting convicted from his apartment because of behavior.  ? ?He stated the family needed assistance with housing referrals and long term care options.  ? ?CSW provided phone number to [facility] of social services for guardianship and adult services.  ? ?CSW will provide support in discharge planning for housing options. Conversation ended without incident, no other requests were made.  ? ?Daion Ginsberg [place], MSW, LCSW-A ?3/30/20234:03 PM  ? ?

## 2021-05-01 NOTE — Plan of Care (Signed)
?  Problem: Education: ?Goal: Knowledge of [place] [facility] Education information/materials will improve ?Outcome: Progressing ?Goal: Mental status will improve ?Outcome: Progressing ?  ?Problem: Health Behavior/Discharge Planning: ?Goal: Identification of resources available to assist in meeting health care needs will improve ?Outcome: Progressing ?Goal: Compliance with treatment plan for underlying cause of condition will improve ?Outcome: Progressing ?  ?Problem: Safety: ?Goal: Periods of time without injury will increase ?Outcome: Progressing ?  ?

## 2021-05-01 NOTE — Progress Notes (Signed)
Patient verbally and physically aggressive at the nursing station this morning. He attempted to throw a computer but staff intervened. Patient continues to demand to call his lawyer, patient continues to demand to call his daughter. Reoriented patient to phone call times. Patient ate his breakfast and went back to his room to lie down.  ?

## 2021-05-02 DIAGNOSIS — F312 Bipolar disorder, current episode manic severe with psychotic features: Secondary | ICD-10-CM | POA: Diagnosis not present

## 2021-05-02 NOTE — Progress Notes (Signed)
[facility] ?05/02/2021 11:05 AM ?Austin ReedyMaurice [name]  ?MRN:  [id number] ?Subjective: Austin Rhodes is alert and awake this morning.  It sounds like he actually slept last night.  He did have to take Thorazine along with his Seroquel and trazodone.  He is more pleasant this morning and able to carry on a somewhat normal conversation.  He still talks about being the [place] [employer].  His speech is less pressured and he is not as aggressive.  He is more compliant with his medications. ? ?Principal Problem: Bipolar I disorder, current or most recent episode manic, with psychotic features (HCC) ?Diagnosis: Principal Problem: ?  Bipolar I disorder, current or most recent episode manic, with psychotic features (HCC) ?Active Problems: ?  Severe manic bipolar 1 disorder with psychotic behavior (HCC) ?  Bipolar 1 disorder (HCC) ? ?Total Time spent with patient: 15 minutes ? ?Past Psychiatric History: Bipolar Disorder ? ?Past Medical History:  ?Past Medical History:  ?Diagnosis Date  ? Bipolar disorder (HCC)   ? Coronary artery disease   ? GERD (gastroesophageal reflux disease)   ? History of multiple strokes   ? PTSD (post-traumatic stress disorder)   ?  ?Past Surgical History:  ?Procedure Laterality Date  ? CARDIAC CATHETERIZATION    ? ?Family History: History reviewed. No pertinent family history. ? ?Social History:  ?Social History  ? ?Substance and Sexual Activity  ?Alcohol Use No  ?   ?Social History  ? ?Substance and Sexual Activity  ?Drug Use Never  ?  ?Social History  ? ?Socioeconomic History  ? Marital status: Divorced  ?  Spouse name: Not on file  ? Number of children: Not on file  ? Years of education: Not on file  ? Highest education level: Not on file  ?Occupational History  ? Not on file  ?Tobacco Use  ? Smoking status: Never  ? Smokeless tobacco: Never  ?Vaping Use  ? Vaping Use: Never used  ?Substance and Sexual Activity  ? Alcohol use: No  ? Drug use: Never  ? Sexual activity: Not on file   ?Other Topics Concern  ? Not on file  ?Social History Narrative  ? Not on file  ? ?Social Determinants of Health  ? ?Financial Resource Strain: Not on file  ?Food Insecurity: Not on file  ?Transportation Needs: Not on file  ?Physical Activity: Not on file  ?Stress: Not on file  ?Social Connections: Not on file  ? ?Additional Social History:  ?  ?  ?  ?  ?  ?  ?  ?  ?  ?  ?  ? ?Sleep: Good ? ?Appetite:  Good ? ?Current Medications: ?Current Facility-Administered Medications  ?Medication Dose Route Frequency Provider Last Rate Last Admin  ? acetaminophen (TYLENOL) tablet 650 mg  650 mg Oral Q6H PRN Sarina IllHerrick, Charlot Gouin Edward, DO   650 mg at 05/01/21 1515  ? albuterol (PROVENTIL) (2.5 MG/3ML) 0.083% nebulizer solution 3 mL  3 mL Inhalation Q4H PRN Sarina IllHerrick, Burnett Lieber Edward, DO      ? alum & mag hydroxide-simeth (MAALOX/MYLANTA) 200-200-20 MG/5ML suspension 30 mL  30 mL Oral Q4H PRN Sarina IllHerrick, Rodrecus Belsky Edward, DO      ? chlorproMAZINE (THORAZINE) tablet 50 mg  50 mg Oral Q4H PRN Sarina IllHerrick, Peter Daquila Edward, DO   50 mg at 05/01/21 2201  ? Or  ? chlorproMAZINE (THORAZINE) injection 50 mg  50 mg Intramuscular Q4H PRN Sarina IllHerrick, Delle Andrzejewski Edward, DO   50 mg at 05/01/21 [id number]  ?  clopidogrel (PLAVIX) tablet 75 mg  75 mg Oral Daily Sarina Ill, DO   75 mg at 05/02/21 6962  ? divalproex (DEPAKOTE ER) 24 hr tablet 500 mg  500 mg Oral TID Sarina Ill, DO   500 mg at 05/02/21 9528  ? lamoTRIgine (LAMICTAL) tablet 25 mg  25 mg Oral BID Sarina Ill, DO   25 mg at 05/02/21 4132  ? LORazepam (ATIVAN) tablet 1 mg  1 mg Oral Q4H PRN Sarina Ill, DO      ? losartan (COZAAR) tablet 50 mg  50 mg Oral Daily Sarina Ill, DO   50 mg at 05/02/21 4401  ? magnesium hydroxide (MILK OF MAGNESIA) suspension 30 mL  30 mL Oral Daily PRN Gabriel Cirri F, NP      ? potassium chloride SA (KLOR-CON M) CR tablet 20 mEq  20 mEq Oral Daily Sarina Ill, DO   20 mEq at 05/02/21 0272  ? QUEtiapine  (SEROQUEL) tablet 400 mg  400 mg Oral QHS Sarina Ill, DO   400 mg at 05/01/21 2201  ? rosuvastatin (CRESTOR) tablet 10 mg  10 mg Oral Daily Sarina Ill, DO   10 mg at 05/02/21 5366  ? traZODone (DESYREL) tablet 100 mg  100 mg Oral QHS PRN Sarina Ill, DO   100 mg at 05/01/21 2200  ? ? ?Lab Results: No results found for this or any previous visit (from the past 48 hour(s)). ? ?Blood Alcohol level:  ?Lab Results  ?Component Value Date  ? ETH <10 04/28/2021  ? ETH <10 03/13/2020  ? ? ?Metabolic Disorder Labs: ?Lab Results  ?Component Value Date  ? HGBA1C 5.3 04/28/2021  ? MPG 105.41 04/28/2021  ? MPG 111.15 03/15/2020  ? ?No results found for: PROLACTIN ?Lab Results  ?Component Value Date  ? CHOL 115 03/15/2020  ? TRIG 117 03/15/2020  ? HDL 52 03/15/2020  ? CHOLHDL 2.2 03/15/2020  ? VLDL 23 03/15/2020  ? LDLCALC 40 03/15/2020  ? LDLCALC 53 03/13/2020  ? ? ?Physical Findings: ?AIMS:  , ,  ,  ,    ?CIWA:    ?COWS:    ? ?Musculoskeletal: ?Strength & Muscle Tone: within normal limits ?Gait & Station: normal ?Patient leans: N/A ? ?Psychiatric Specialty Exam: ? ?Presentation  ?General Appearance: Appropriate for Environment ? ?Eye Contact:Good ? ?Speech:Pressured ? ?Speech Volume:Normal ? ?Handedness:No data recorded ? ?Mood and Affect  ?Mood:Anxious ? ?Affect:Congruent ? ? ?Thought Process  ?Thought Processes:Disorganized ? ?Descriptions of Associations:Tangential ? ?Orientation:Partial ? ?Thought Content:Delusions ? ?History of Schizophrenia/Schizoaffective disorder:No ? ?Duration of Psychotic Symptoms:Greater than six months ? ?Hallucinations:No data recorded ?Ideas of Reference:Delusions ? ?Suicidal Thoughts:No data recorded ?Homicidal Thoughts:No data recorded ? ?Sensorium  ?Memory:Immediate Poor ? ?Judgment:Impaired ? ?Insight:Poor ? ? ?Executive Functions  ?Concentration:Poor ? ?Attention Span:Poor ? ?Recall:Poor ? ?Fund of Knowledge:Fair ? ?Language:Fair ? ? ?Psychomotor Activity   ?Psychomotor Activity:No data recorded ? ?Assets  ?Assets:Resilience ? ? ?Sleep  ?Sleep:No data recorded ? ? ?Physical Exam: ?Physical Exam ?Vitals and nursing note reviewed.  ?Constitutional:   ?   Appearance: Normal appearance. He is normal weight.  ?Neurological:  ?   General: No focal deficit present.  ?   Mental Status: He is alert and oriented to person, place, and time.  ?Psychiatric:     ?   Attention and Perception: Attention and perception normal.     ?   Mood and Affect: Mood is elated. Affect is inappropriate.     ?  Speech: Speech is rapid and pressured.     ?   Behavior: Behavior is aggressive.     ?   Thought Content: Thought content is delusional.     ?   Cognition and Memory: Cognition and memory normal.     ?   Judgment: Judgment is impulsive and inappropriate.  ? ?Review of Systems  ?Constitutional: Negative.   ?HENT: Negative.    ?Eyes: Negative.   ?Respiratory: Negative.    ?Cardiovascular: Negative.   ?Gastrointestinal: Negative.   ?Genitourinary: Negative.   ?Musculoskeletal: Negative.   ?Skin: Negative.   ?Neurological: Negative.   ?Endo/Heme/Allergies: Negative.   ?Psychiatric/Behavioral: Negative.    ?Blood pressure 127/77, pulse 88, temperature 97.8 ?F (36.6 ?C), temperature source Oral, resp. rate 18, height 5\' 8"  (1.727 m), weight 76.2 kg, SpO2 99 %. Body mass index is 25.54 kg/m?. ? ? ?Treatment Plan Summary: ?Daily contact with patient to assess and evaluate symptoms and progress in treatment, Medication management, and Plan continue current medications. ? ? , DO ?05/02/2021, 11:05 AM ? ?

## 2021-05-02 NOTE — [facility] Suicide Risk Assessment (Signed)
[facility] INPATIENT:  Family/Significant Other Suicide Prevention Education ? ?Suicide Prevention Education:  ?Education Completed; Economist, son(name of family member/significant other) has been identified by the patient as the family member/significant other with whom the patient will be residing, and identified as the person(s) who will aid the patient in the event of a mental health crisis (suicidal ideations/suicide attempt).  With written consent from the patient, the family member/significant other has been provided the following suicide prevention education, prior to the and/or following the discharge of the patient. ? ?The suicide prevention education provided includes the following: ?Suicide risk factors ?Suicide prevention and interventions ?National Suicide Hotline telephone number ?[facility] assessment telephone number ?[facility] Emergency Assistance 911 ?[place] and/or Residential Mobile Crisis Unit telephone number ? ?Request made of family/significant other to: ?Remove weapons (e.g., guns, rifles, knives), all items previously/currently identified as safety concern.   ?Remove drugs/medications (over-the-counter, prescriptions, illicit drugs), all items previously/currently identified as a safety concern. ? ?The family member/significant other verbalizes understanding of the suicide prevention education information provided.  The family member/significant other agrees to remove the items of safety concern listed above. ? ?Austin Rhodes A [place] ?05/02/2021, 4:18 PM ?

## 2021-05-02 NOTE — Progress Notes (Signed)
Recreation Therapy Notes ? ?INPATIENT RECREATION TR PLAN ? ?Patient Details ?Name: Austin Rhodes ?MRN: [id number] ?DOB: 1950-02-17 ?Today's Date: 05/02/2021 ? ?Rec Therapy Plan ?Is patient appropriate for Therapeutic Recreation?: Yes ?Treatment times per week: at least 3 ?Estimated Length of Stay: 5-7 days ?TR Treatment/Interventions: Group participation (Comment) ? ?Discharge Criteria ?Pt will be discharged from therapy if:: Discharged ?Treatment plan/goals/alternatives discussed and agreed upon by:: Patient/family ? ?Discharge Summary ?  ? ? ?Lino Wickliff ?05/02/2021, 3:17 PM ?

## 2021-05-02 NOTE — Progress Notes (Signed)
Recreation Therapy Notes ? ?INPATIENT RECREATION THERAPY ASSESSMENT ? ?Patient Details ?Name: Austin Rhodes ?MRN: [id number] ?DOB: May 13, 1950 ?Today's Date: 05/02/2021 ?      ?Information Obtained From: ?Patient ? ?Able to Participate in Assessment/Interview: ?Yes ? ?Patient Presentation: ?Responsive, Hyperverbal, Disheveled ? ?Reason for Admission (Per Patient): ?Active Symptoms ? ?Patient Stressors: ?  ? ?Coping Skills:   ?Talk ? ?Leisure Interests (2+):  ? (Sailing, snow skiing, 40 girlfriends, 14 kids) ? ?Frequency of Recreation/Participation: ?Monthly ? ?Awareness of Community Resources:  ?Yes ? ?Community Resources:  ?[employer] ? ?Current Use: ?Yes ? ?If no, Barriers?: ?  ? ?Expressed Interest in [employer] Information: ?Yes ? ?[place] of Residence:  ?[place] ? ?Patient Main Form of Transportation: ?Other (Comment) ? ?Patient Strengths:  ?Good at sex ? ?Patient Identified Areas of Improvement:  ?Nothing ? ?Patient Goal for Hospitalization:  ?Get out ? ?Current SI (including self-harm):  ?No ? ?Current HI:  ?No ? ?Current AVH: ?No ? ?Staff Intervention Plan: ?Group Attendance, Collaborate with Interdisciplinary Treatment Team ? ?Consent to Intern Participation: ?N/A ? ?Kosisochukwu Burningham ?05/02/2021, 3:16 PM ?

## 2021-05-02 NOTE — BHH Counselor (Signed)
Adult Comprehensive Assessment ? ?Patient ID: Austin Rhodes, male   DOB: 06-25-1950, 71 y.o.   MRN: [id number] ? ?Information Source: ?Information source: Patient (Partial assessment completed with chart review and pt's son, Eloise Levels) ? ?Current Stressors:  ?Patient states their primary concerns and needs for treatment are:: "im an [employer] of the [place] [employer] working on a drug bust" ?Patient states their goals for this hospitilization and ongoing recovery are:: "get the hell out" ?Educational / Learning stressors: Pt denies ?Employment / Job issues: Pt denies ?Family Relationships: Pt denies ?Financial / Lack of resources (include bankruptcy): Pt denies ?Housing / Lack of housing: Pt's son states that pt cannot return to his living situation because he was evicted ?Physical health (include injuries & life threatening diseases): Pt denies ?Social relationships: Pt denies ?Substance abuse: Pt states that he drink beer and champagne occasionally ?Bereavement / Loss: "my brother passed away" ? ?Living/Environment/Situation:  ?Living Arrangements: Alone ?Living conditions (as described by patient or guardian): "my daughter checks on me everyday" ?What is atmosphere in current home: Chaotic ? ?Family History:  ?Marital status: Single ?Are you sexually active?:  (unable to assess) ?What is your sexual orientation?: unable to assess ?Has your sexual activity been affected by drugs, alcohol, medication, or emotional stress?: unable to assess ?Does patient have children?: Yes (Pt's son stated that he has 3 children) ?How many children?: 14 ("14 kids, 4 by my ex") ?How is patient's relationship with their children?: Pt's son states that they are burned out trying to take care of their father because he has been in and out of different apartments ? ?Childhood History:  ?By whom was/is the patient raised?: Both parents ?Description of patient's relationship with caregiver when they were a child: "most wonderful  parenting" ?Patient's description of current relationship with people who raised him/her: Deceased ?How were you disciplined when you got in trouble as a child/adolescent?: "no discipline" ?Does patient have siblings?: Yes ?Number of Siblings: 3 (2 brothers, 1 sister) ?Description of patient's current relationship with siblings: 1 brother deceased, "talk to my other brother everyday" ?Did patient suffer any verbal/emotional/physical/sexual abuse as a child?: No ?Did patient suffer from severe childhood neglect?: No ?Has patient ever been sexually abused/assaulted/raped as an adolescent or adult?: No ?Was the patient ever a victim of a crime or a disaster?: No ?Witnessed domestic violence?: No ?Has patient been affected by domestic violence as an adult?: No ? ?Education:  ?Highest grade of school patient has completed: "I'm a doctor" ?Currently a student?: No ?Learning disability?: No ? 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 ?Financial Resources:   ?Financial resources: Medicare (Pt states that he is "the richest man in the world") ?Does patient have a representative payee or guardian?: No ? ?Alcohol/Substance Abuse:   ?What has been your use of drugs/alcohol within the last 12 months?:  Pt states that he drinks beer and champagne every now and again ?If  attempted suicide, did drugs/alcohol play a role in this?: No ?Alcohol/Substance Abuse Treatment Hx: Denies past history ?Has alcohol/substance abuse ever caused legal problems?: No ? ?Social Support System:   ?Heritage manager System: Fair ?Describe Community Support System: Pt's son states that he and his siblings are heavily involved in assist pt ?Type of faith/religion: Presbyterian ?How does patient's faith help to cope with current illness?: Pt denies ? ?Leisure/Recreation:   ?Do You Have Hobbies?: Yes ?Leisure and Hobbies: "chase pussy, research" ? ?Strengths/Needs:   ?What is the patient's perception of their strengths?: "richest man in the world" ?Patient states they can use these personal strengths during their treatment to contribute to their recovery: Pt denies ?Patient states these barriers may affect/interfere with their treatment: Pt denies ?Patient states these barriers may affect their return to the community: Pt's son states that pt cannot return to his apartment because he has been evicted ? ?Discharge Plan:   ?Currently receiving community mental health services: No ?Patient states concerns and preferences for aftercare planning are: Pt states that he does not have a primary care provider and is not interested in a referral for community mental health ?Patient states they will know when they are safe and ready for discharge when: "not supposed to be here anyway" ?Does patient have access to transportation?: Yes ?Does patient have financial barriers related to discharge medications?: No ?Plan for living situation after discharge: Pt's son states that pt needs a referral to a higher level of care. CSW will assist pt with referral for housing options ?Will patient be returning to same living situation after discharge?: No ? ?Summary/Recommendations:   ?Summary and Recommendations (to be completed by the evaluator): Patient is a 72 year old male in a long-term relationship from [place],  [place] ([place]). He reports that he receives social security retirement and is "rich."  He presents to the hospital involuntarily following manic and bizarre behaviors in the community. Recent stressors include increased manic behavior, fixed delusions and not taking medication regularly. He has a primary diagnosis of Bipolar I disorder, current or most recent episode manic, with psychotic features. Patient's son states that he cannot return to his apartment as he has been evicted. He stated that his family is interested in finding him a higher level of care for placement. Patient declined follow up aftercare and states that he does not have an outpatient provider. Patient has no insight and is a poor historian. Recommendations include: crisis stabilization, therapeutic milieu, encourage group attendance and participation, medication management for mood stabilization and development of comprehensive mental wellness plan. ? ?Myha Arizpe A [place]. 05/02/2021 ?

## 2021-05-02 NOTE — Progress Notes (Signed)
Patient is alert to place and person, disoriented to time and situation. Patient reports sleeping well last night. He is calm, pleasant and cooperative but very delusional. 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 Patient is less aggressive today, ate breakfast and lunch in the day room. Compliant with medication. Patient was in group very briefly and left. Q 15 minutes safety checks observed. ?

## 2021-05-02 NOTE — Progress Notes (Signed)
Pt approached nurses' station, demanding staff to make a phone call for him. Pt yelled, "I'm the admiral!" when staff did not respond immediately. Pt was able to be calmed for shift assessment. Pt states "I have 40 top shelf pussies" and says that he is feeling "horny". He states "I have 7 Jennifers" and began naming Charyl Bigger and Ferdinand Cava (actresses). 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 He describes his sleep as "intermittent" because "I'm sleeping somewhere I ain't never been." He expresses feelings of anxiety because "Where are my Ferraris?" No acute distress noted. ?

## 2021-05-02 NOTE — Progress Notes (Signed)
Recreation Therapy Notes ? ?Date: 05/02/2021  ?  ?Time: 1:15pm   ?  ?Location: Craft room    ?  ?Behavioral response: Appropriate ?  ?Intervention Topic:  Social Skills  ?  ?Discussion/Intervention:  ?Group content on today was focused on social skills. The group defined social skills and identified ways they use social skills. Patients expressed what obstacles they face when trying to be social. Participants described the importance of social skills. The group listed ways to improve social skills and reasons to improve social skills. Individuals had an opportunity to learn new and improve social skills as well as identify their weaknesses. ?  ?Clinical Observations/Feedback: ?Patient came to group and defined social skills as the ability to communicate with others and make things happen. Participant stated that he socializes with others by speaking, dancing, fellowship at church. Individual was social with peers and staff while participating in the intervention.   ?Austin Rhodes LRT/CTRS  ? ? ? ? ? ? ?Austin Rhodes ?05/02/2021 3:27 PM ?

## 2021-05-03 NOTE — Progress Notes (Signed)
[facility] MD Progress Note ? ?05/03/2021 1:56 PM ?Austin Rhodes  ?MRN:  [id number] ?Subjective:  Patient presents Irritable, Verbally Aggressive, and Manic.  Patient continues to make Grandiose statements "I own this Hospital I bought it for 10 cents on the dollar because It was in terrible condition.  Patient is intrusive and argumentative with staff "You think you a Bad Bitch!! Don't you? when approaching nursing station.  Patient refused medications several times during shift.  Nursing staff did redirect several times thorough out shift.  Pt did eventually take mediations but insisted "I need to stay in the Dayroom because "I am communicating with the electronic waves in television and over head speaker". Emotional support and encouragement given. Pt given adequate intake and fluids.  ? Patient presents somewhat agitated, makes grandiose statements and gets angry quickly. He is taking his meds and got some sleep last night.  ? ?Principal Problem: Bipolar I disorder, current or most recent episode manic, with psychotic features ([place]) ?Diagnosis: Principal Problem: ?  Bipolar I disorder, current or most recent episode manic, with psychotic features ([place]) ?Active Problems: ?  Severe manic bipolar 1 disorder with psychotic behavior ([place]) ?  Bipolar 1 disorder ([place]) ? ?Total Time spent with patient: 20 minutes ? ?Past Psychiatric History: Bipolar D/O ? ?Past Medical History:  ?Past Medical History:  ?Diagnosis Date  ? Bipolar disorder ([place])   ? Coronary artery disease   ? GERD (gastroesophageal reflux disease)   ? History of multiple strokes   ? PTSD (post-traumatic stress disorder)   ?  ?Past Surgical History:  ?Procedure Laterality Date  ? CARDIAC CATHETERIZATION    ? ?Family History: History reviewed. No pertinent family history. ?Family Psychiatric  History:  ?Social History:  ?Social History  ? ?Substance and Sexual Activity  ?Alcohol Use No  ?   ?Social History  ? ?Substance and Sexual Activity  ?Drug Use Never   ?  ?Social History  ? ?Socioeconomic History  ? Marital status: Divorced  ?  Spouse name: Not on file  ? Number of children: Not on file  ? Years of education: Not on file  ? Highest education level: Not on file  ?Occupational History  ? Not on file  ?Tobacco Use  ? Smoking status: Never  ? Smokeless tobacco: Never  ?Vaping Use  ? Vaping Use: Never used  ?Substance and Sexual Activity  ? Alcohol use: No  ? Drug use: Never  ? Sexual activity: Not on file  ?Other Topics Concern  ? Not on file  ?Social History Narrative  ? Not on file  ? ?Social Determinants of Health  ? ?Financial Resource Strain: Not on file  ?Food Insecurity: Not on file  ?Transportation Needs: Not on file  ?Physical Activity: Not on file  ?Stress: Not on file  ?Social Connections: Not on file  ? ?Additional Social History:  ?  ?  ?  ?  ?  ?  ?  ?  ?  ?  ?  ? ?Sleep: Fair ? ?Appetite:  Fair ? ?Current Medications: ?Current Facility-Administered Medications  ?Medication Dose Route Frequency Provider Last Rate Last Admin  ? acetaminophen (TYLENOL) tablet 650 mg  650 mg Oral Q6H PRN Parks Ranger, DO   650 mg at 05/01/21 1515  ? albuterol (PROVENTIL) (2.5 MG/3ML) 0.083% nebulizer solution 3 mL  3 mL Inhalation Q4H PRN Parks Ranger, DO      ? alum & mag hydroxide-simeth (MAALOX/MYLANTA) 200-200-20 MG/5ML suspension 30 mL  30  mL Oral Q4H PRN Sarina Ill, DO      ? chlorproMAZINE (THORAZINE) tablet 50 mg  50 mg Oral Q4H PRN Sarina Ill, DO   50 mg at 05/02/21 2129  ? Or  ? chlorproMAZINE (THORAZINE) injection 50 mg  50 mg Intramuscular Q4H PRN Sarina Ill, DO   50 mg at 05/01/21 8032  ? clopidogrel (PLAVIX) tablet 75 mg  75 mg Oral Daily Sarina Ill, DO   75 mg at 05/03/21 1108  ? divalproex (DEPAKOTE ER) 24 hr tablet 500 mg  500 mg Oral TID Sarina Ill, DO   500 mg at 05/03/21 1105  ? lamoTRIgine (LAMICTAL) tablet 25 mg  25 mg Oral BID Sarina Ill, DO   25 mg at  05/03/21 1106  ? LORazepam (ATIVAN) tablet 1 mg  1 mg Oral Q4H PRN Sarina Ill, DO   1 mg at 05/02/21 2130  ? losartan (COZAAR) tablet 50 mg  50 mg Oral Daily Sarina Ill, DO   50 mg at 05/03/21 1105  ? magnesium hydroxide (MILK OF MAGNESIA) suspension 30 mL  30 mL Oral Daily PRN Gabriel Cirri F, NP      ? potassium chloride SA (KLOR-CON M) CR tablet 20 mEq  20 mEq Oral Daily Sarina Ill, DO   20 mEq at 05/03/21 1106  ? QUEtiapine (SEROQUEL) tablet 400 mg  400 mg Oral QHS Sarina Ill, DO   400 mg at 05/02/21 2130  ? rosuvastatin (CRESTOR) tablet 10 mg  10 mg Oral Daily Sarina Ill, DO   10 mg at 05/03/21 1105  ? traZODone (DESYREL) tablet 100 mg  100 mg Oral QHS PRN Sarina Ill, DO   100 mg at 05/02/21 2138  ? ? ?Lab Results: No results found for this or any previous visit (from the past 48 hour(s)). ? ?Blood Alcohol level:  ?Lab Results  ?Component Value Date  ? ETH <10 04/28/2021  ? ETH <10 03/13/2020  ? ? ?Metabolic Disorder Labs: ?Lab Results  ?Component Value Date  ? HGBA1C 5.3 04/28/2021  ? MPG 105.41 04/28/2021  ? MPG 111.15 03/15/2020  ? ?No results found for: PROLACTIN ?Lab Results  ?Component Value Date  ? CHOL 115 03/15/2020  ? TRIG 117 03/15/2020  ? HDL 52 03/15/2020  ? CHOLHDL 2.2 03/15/2020  ? VLDL 23 03/15/2020  ? LDLCALC 40 03/15/2020  ? LDLCALC 53 03/13/2020  ? ? ?Physical Findings: ?AIMS:  , ,  ,  ,    ?CIWA:    ?COWS:    ? ?Musculoskeletal: ?Strength & Muscle Tone: within normal limits ?Gait & Station: unsteady ?Patient leans: Front ? ?Psychiatric Specialty Exam: ? ?Presentation  ?General Appearance: Appropriate for Environment ? ?Eye Contact:Good ? ?Speech:Pressured ? ?Speech Volume:Normal ? ?Handedness:No data recorded ? ?Mood and Affect  ?Mood:Anxious ? ?Affect:Congruent ? ? ?Thought Process  ?Thought Processes:Disorganized ? ?Descriptions of Associations:Tangential ? ?Orientation:Partial ? ?Thought  Content:Delusions ? ?History of Schizophrenia/Schizoaffective disorder:No ? ?Duration of Psychotic Symptoms:Greater than six months ? ?Hallucinations:No data recorded ?Ideas of Reference:Delusions ? ?Suicidal Thoughts:No data recorded ?Homicidal Thoughts:No data recorded ? ?Sensorium  ?Memory:Immediate Poor ? ?Judgment:Impaired ? ?Insight:Poor ? ? ?Executive Functions  ?Concentration:Poor ? ?Attention Span:Poor ? ?Recall:Poor ? ?Fund of Knowledge:Fair ? ?Language:Fair ? ? ?Psychomotor Activity  ?Psychomotor Activity:No data recorded ? ?Assets  ?Assets:Resilience ? ? ?Sleep  ?Sleep:No data recorded ? ? ?Physical Exam: ?Physical Exam ?Constitutional:   ?   Appearance: Normal appearance. He is  obese.  ?HENT:  ?   Head: Normocephalic and atraumatic.  ?   Right Ear: Tympanic membrane normal.  ?   Left Ear: Tympanic membrane normal.  ?   Nose: Nose normal.  ?   Mouth/Throat:  ?   Mouth: Mucous membranes are moist.  ?   Pharynx: Oropharynx is clear.  ?Eyes:  ?   Extraocular Movements: Extraocular movements intact.  ?   Conjunctiva/sclera: Conjunctivae normal.  ?   Pupils: Pupils are equal, round, and reactive to light.  ?Cardiovascular:  ?   Rate and Rhythm: Normal rate and regular rhythm.  ?   Pulses: Normal pulses.  ?   Heart sounds: Normal heart sounds.  ?Pulmonary:  ?   Effort: Pulmonary effort is normal.  ?   Breath sounds: Normal breath sounds.  ?Abdominal:  ?   General: Abdomen is flat. Bowel sounds are normal.  ?   Palpations: Abdomen is soft.  ?Musculoskeletal:     ?   General: Normal range of motion.  ?   Cervical back: Normal range of motion.  ?Skin: ?   General: Skin is warm and dry.  ?Neurological:  ?   General: No focal deficit present.  ?   Mental Status: He is alert.  ? ?Review of Systems  ?Constitutional: Negative.   ?HENT: Negative.    ?Eyes: Negative.   ?Respiratory: Negative.    ?Cardiovascular: Negative.   ?Gastrointestinal: Negative.   ?Genitourinary: Negative.   ?Musculoskeletal: Negative.   ?Skin:  Negative.   ?Neurological: Negative.   ?Endo/Heme/Allergies: Negative.   ?Psychiatric/Behavioral:  Positive for memory loss. The patient is nervous/anxious and has insomnia.   ?Blood pressure 127/77, pulse 88, temperature

## 2021-05-03 NOTE — Progress Notes (Signed)
Patient Irritable, Verbally Aggressive and Threatening staff.  Patient denies SI/HI/Avh and contracts for safety.  Patient Denies Anxiety and Depression. Patient continues to approach the nursing station stating he is leaving.  Pt called daughter and screamed "Shut the Fuc_!! UP!! I busting out of here and you need to come pick me up.  There is a fentanyl drug bust and a bomb threat! I am shutting this entire floor down, I own this hospital!!.  Nursing staff continue to redirect patient.  Emotional support and education given. Night snack and adequate fluids given. Q 15 minute checks in place.   ?

## 2021-05-03 NOTE — Group Note (Signed)
LCSW Group Therapy Note ? ?Group Date: 05/03/2021 ?Start Time: 1300 ?End Time: 1400 ? ? ?Type of Therapy and Topic:  Group Therapy - Healthy vs Unhealthy Coping Skills ? ?Participation Level:  Did Not Attend  ? ?Description of Group ?The focus of this group was to determine what unhealthy coping techniques typically are used by group members and what healthy coping techniques would be helpful in coping with various problems. Patients were guided in becoming aware of the differences between healthy and unhealthy coping techniques. Patients were asked to identify 2-3 healthy coping skills they would like to learn to use more effectively. ? ?Therapeutic Goals ?Patients learned that coping is what human beings do all day long to deal with various situations in their lives ?Patients defined and discussed healthy vs unhealthy coping techniques ?Patients identified their preferred coping techniques and identified whether these were healthy or unhealthy ?Patients determined 2-3 healthy coping skills they would like to become more familiar with and use more often. ?Patients provided support and ideas to each other ? ? ?Summary of Patient Progress:  Due to limited staffing, group was not held on the unit.  ? ? ?Therapeutic Modalities ?Cognitive Behavioral Therapy ?Motivational Interviewing ? ?Trinady Milewski K Salimatou Simone, LCSWA ?05/03/2021  4:31 PM   ?

## 2021-05-03 NOTE — Plan of Care (Signed)
?  Problem: Education: ?Goal: Knowledge of [facility] Education information/materials will improve ?Outcome: Not Progressing ?Goal: Mental status will improve ?Outcome: Not Progressing ?  ?Problem: Health Behavior/Discharge Planning: ?Goal: Identification of resources available to assist in meeting health care needs will improve ?Outcome: Not Progressing ?  ?

## 2021-05-03 NOTE — Plan of Care (Signed)
?  Problem: Education: ?Goal: Knowledge of [facility] Education information/materials will improve ?Outcome: Progressing ?Goal: Mental status will improve ?Outcome: Progressing ?  ?Problem: Safety: ?Goal: Periods of time without injury will increase ?Outcome: Progressing ?  ?Problem: Health Behavior/Discharge Planning: ?Goal: Identification of resources available to assist in meeting health care needs will improve ?Outcome: Not Progressing ?Goal: Compliance with treatment plan for underlying cause of condition will improve ?Outcome: Not Progressing ?  ?

## 2021-05-03 NOTE — Progress Notes (Signed)
Patient presents Irritable, Verbally Aggressive, and Manic.  Patient continues to make Grandiose statements "I own this Hospital I bought it for 10 cents on the dollar because It was in terrible condition.  Patient is intrusive and argumentative with staff "You think you a Bad Bitch!! Don't you? when approaching nursing station.  Patient refused medications several times during shift.  Nursing staff did redirect several times thorough out shift.  Pt did eventually take mediations but insisted "I need to stay in the Dayroom because "I am communicating with the electronic waves in television and over head speaker". Emotional support and encouragement given. Pt given adequate intake and fluids. Q 15 minute safety checks in place.  Pt informed to notify nursing staff if needs arise. Will continue plan of care.   ?

## 2021-05-03 NOTE — Progress Notes (Signed)
Patient spent the initial part of the shift resting in bed, his gait remains unsteady. 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 He is hyper verbal and requires frequent redirection to be on task. He refused his evening Depakote and he remains fixated on owning the hospital.   ?

## 2021-05-04 NOTE — Progress Notes (Signed)
[facility] ? ?05/04/2021 11:51 AM ?Austin ReedyMaurice Dale Paolella  ?MRN:  [id number] ?Subjective:  Nursing Report : Patient spent the initial part of the shift resting in bed, his gait remains unsteady. 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 He is hyper verbal and requires frequent redirection to be on task. He refused his evening Depakote and he remains fixated on owning the hospital.  ?Patient Irritable, Verbally Aggressive and Threatening staff.  Patient denies SI/HI/Avh and contracts for safety.  Patient Denies Anxiety and Depression. Patient continues to approach the nursing station stating he is leaving.  Pt called daughter and screamed "Shut the Fuc_!! UP!! I busting out of here and you need to come pick me up.  There is a fentanyl drug bust and a bomb threat! I am shutting this entire floor down, I own this hospital!!.  Nursing staff continue to redirect patient.  ? Patient reports feeling alright today; he is apologetic for pulling out the computer at nurses station yesterday. He slept well last night and has been taking his medicines.  ? ?Principal Problem: Bipolar I disorder, current or most recent episode manic, with psychotic features (HCC) ?Diagnosis: Principal Problem: ?  Bipolar I disorder, current or most recent episode manic, with psychotic features (HCC) ?Active Problems: ?  Severe manic bipolar 1 disorder with psychotic behavior (HCC) ?  Bipolar 1 disorder (HCC) ? ?Total Time spent with patient: 20 minutes ? ?Past Psychiatric History: Bipolar D/O ? ?Past Medical History:  ?Past Medical History:  ?Diagnosis Date  ? Bipolar disorder (HCC)   ? Coronary artery disease   ? GERD (gastroesophageal reflux disease)   ? History of multiple strokes   ? PTSD (post-traumatic stress disorder)   ?  ?Past Surgical History:  ?Procedure Laterality Date  ? CARDIAC CATHETERIZATION    ? ?Family History: History reviewed. No pertinent family history. ?Family Psychiatric  History:   ?Social History:  ?Social History  ? ?Substance and Sexual Activity  ?Alcohol Use No  ?   ?Social History  ? ?Substance and Sexual Activity  ?Drug Use Never  ?  ?Social History  ? ?Socioeconomic History  ? Marital status: Divorced  ?  Spouse name: Not on file  ? Number of children: Not on file  ? Years of education: Not on file  ? Highest education level: Not on file  ?Occupational History  ? Not on file  ?Tobacco Use  ? Smoking status: Never  ? Smokeless tobacco: Never  ?Vaping Use  ? Vaping Use: Never used  ?Substance and Sexual Activity  ? Alcohol use: No  ? Drug use: Never  ? Sexual activity: Not on file  ?Other Topics Concern  ? Not on file  ?Social History Narrative  ? Not on file  ? ?Social Determinants of Health  ? ?Financial Resource Strain: Not on file  ?Food Insecurity: Not on file  ?Transportation Needs: Not on file  ?Physical Activity: Not on file  ?Stress: Not on file  ?Social Connections: Not on file  ? ?Additional Social History:  ?  ?  ?  ?  ?  ?  ?  ?  ?  ?  ?  ? ?Sleep: Good ? ?Appetite:  Fair ? ?Current Medications: ?Current Facility-Administered Medications  ?Medication Dose Route Frequency Provider Last Rate Last Admin  ? acetaminophen (TYLENOL) tablet 650 mg  650 mg Oral Q6H PRN Sarina IllHerrick, Richard Edward, DO   650 mg at 05/01/21 1515  ?  albuterol (PROVENTIL) (2.5 MG/3ML) 0.083% nebulizer solution 3 mL  3 mL Inhalation Q4H PRN Sarina Ill, DO      ? alum & mag hydroxide-simeth (MAALOX/MYLANTA) 200-200-20 MG/5ML suspension 30 mL  30 mL Oral Q4H PRN Sarina Ill, DO      ? chlorproMAZINE (THORAZINE) tablet 50 mg  50 mg Oral Q4H PRN Sarina Ill, DO   50 mg at 05/03/21 2121  ? Or  ? chlorproMAZINE (THORAZINE) injection 50 mg  50 mg Intramuscular Q4H PRN Sarina Ill, DO   50 mg at 05/01/21 1660  ? clopidogrel (PLAVIX) tablet 75 mg  75 mg Oral Daily Sarina Ill, DO   75 mg at 05/03/21 1108  ? divalproex (DEPAKOTE ER) 24 hr tablet 500 mg  500  mg Oral TID Sarina Ill, DO   500 mg at 05/03/21 2119  ? lamoTRIgine (LAMICTAL) tablet 25 mg  25 mg Oral BID Sarina Ill, DO   25 mg at 05/03/21 2119  ? LORazepam (ATIVAN) tablet 1 mg  1 mg Oral Q4H PRN Sarina Ill, DO   1 mg at 05/03/21 2208  ? losartan (COZAAR) tablet 50 mg  50 mg Oral Daily Sarina Ill, DO   50 mg at 05/03/21 1105  ? magnesium hydroxide (MILK OF MAGNESIA) suspension 30 mL  30 mL Oral Daily PRN Gabriel Cirri F, NP      ? potassium chloride SA (KLOR-CON M) CR tablet 20 mEq  20 mEq Oral Daily Sarina Ill, DO   20 mEq at 05/03/21 1106  ? QUEtiapine (SEROQUEL) tablet 400 mg  400 mg Oral QHS Sarina Ill, DO   400 mg at 05/03/21 2119  ? rosuvastatin (CRESTOR) tablet 10 mg  10 mg Oral Daily Sarina Ill, DO   10 mg at 05/03/21 1105  ? traZODone (DESYREL) tablet 100 mg  100 mg Oral QHS PRN Sarina Ill, DO   100 mg at 05/03/21 2119  ? ? ?Lab Results: No results found for this or any previous visit (from the past 48 hour(s)). ? ?Blood Alcohol level:  ?Lab Results  ?Component Value Date  ? ETH <10 04/28/2021  ? ETH <10 03/13/2020  ? ? ?Metabolic Disorder Labs: ?Lab Results  ?Component Value Date  ? HGBA1C 5.3 04/28/2021  ? MPG 105.41 04/28/2021  ? MPG 111.15 03/15/2020  ? ?No results found for: PROLACTIN ?Lab Results  ?Component Value Date  ? CHOL 115 03/15/2020  ? TRIG 117 03/15/2020  ? HDL 52 03/15/2020  ? CHOLHDL 2.2 03/15/2020  ? VLDL 23 03/15/2020  ? LDLCALC 40 03/15/2020  ? LDLCALC 53 03/13/2020  ? ? ?Physical Findings: ?AIMS:  , ,  ,  ,    ?CIWA:    ?COWS:    ? ?Musculoskeletal: ?Strength & Muscle Tone: within normal limits ?Gait & Station: normal ?Patient leans: Front ? ?Psychiatric Specialty Exam: ? ?Presentation  ?General Appearance: Disheveled ? ?Eye Contact:Fair ? ?Speech:Slow ? ?Speech Volume:Decreased ? ?Handedness:Right ? ? ?Mood and Affect  ?Mood:Angry; Anxious;  Irritable ? ?Affect:Restricted ? ? ?Thought Process  ?Thought Processes:Disorganized ? ?Descriptions of Associations:Loose ? ?Orientation:Partial ? ?Thought Content:Delusions; Illogical; Paranoid Ideation; Perseveration; Scattered ? ?History of Schizophrenia/Schizoaffective disorder:No ? ?Duration of Psychotic Symptoms:Greater than six months ? ?Hallucinations:Hallucinations: None ? ?Ideas of Reference:Paranoia ? ?Suicidal Thoughts:Suicidal Thoughts: No ? ?Homicidal Thoughts:Homicidal Thoughts: No ? ? ?Sensorium  ?Memory:Immediate Poor; Recent Poor; Remote Poor ? ?Judgment:Impaired ? ?Insight:Poor ? ? ?Executive Functions  ?Concentration:Poor ? ?  Attention Span:Poor ? ?Recall:Poor ? ?Fund of Knowledge:Poor ? ?Language:Poor ? ? ?Psychomotor Activity  ?Psychomotor Activity:Psychomotor Activity: Psychomotor Retardation ? ? ?Assets  ?Assets:Resilience ? ? ?Sleep  ?Sleep:Sleep: Fair ? ? ? ?Physical Exam: ?Physical Exam ?Constitutional:   ?   Appearance: Normal appearance. He is normal weight.  ?HENT:  ?   Head: Normocephalic and atraumatic.  ?   Right Ear: Tympanic membrane, ear canal and external ear normal.  ?   Left Ear: Tympanic membrane, ear canal and external ear normal.  ?   Nose: Nose normal.  ?   Mouth/Throat:  ?   Mouth: Mucous membranes are moist.  ?   Pharynx: Oropharynx is clear.  ?Eyes:  ?   Extraocular Movements: Extraocular movements intact.  ?   Conjunctiva/sclera: Conjunctivae normal.  ?   Pupils: Pupils are equal, round, and reactive to light.  ?Cardiovascular:  ?   Rate and Rhythm: Normal rate and regular rhythm.  ?   Pulses: Normal pulses.  ?   Heart sounds: Normal heart sounds.  ?Pulmonary:  ?   Effort: Pulmonary effort is normal.  ?   Breath sounds: Normal breath sounds.  ?Abdominal:  ?   General: Abdomen is flat. Bowel sounds are normal.  ?   Palpations: Abdomen is soft.  ?Musculoskeletal:     ?   General: Normal range of motion.  ?   Cervical back: Normal range of motion and neck supple.  ?Skin: ?    General: Skin is warm and dry.  ?Neurological:  ?   General: No focal deficit present.  ?   Mental Status: He is alert and oriented to person, place, and time.  ? ?Review of Systems  ?Constitutional: Negative.   ?HENT: Negative.    ?E

## 2021-05-04 NOTE — Progress Notes (Signed)
?   05/04/21 1900  ?Psych Admission Type (Psych Patients Only)  ?Admission Status Involuntary  ?Psychosocial Assessment  ?Patient Complaints None  ?Eye Contact Fair  ?Facial Expression Anxious  ?Affect Labile;Preoccupied  ?Speech Argumentative;Tangential  ?Interaction Assertive  ?Motor Activity Shuffling;Slow  ?Appearance/Hygiene Disheveled  ?Behavior Characteristics Anxious  ?Mood Labile;Apprehensive;Preoccupied  ?Thought Process  ?Agricultural engineer of ideas;Loose associations;Tangential  ?Content Delusions;Magical thinking  ?Delusions Grandeur  ?Perception Illusions  ?Hallucination None reported or observed  ?Judgment Poor  ?Confusion Mild  ?Danger to Self  ?Current suicidal ideation? Denies  ?Danger to Others  ?Danger to Others None reported or observed  ? ?Patient continues to have grandeur delusions. 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 Pt ambulating without a walker, visible in Milieu. Prn for anxiety and agitation given this shift and effective. Patient remains safe. Support and encouragement provided.  ?

## 2021-05-04 NOTE — Group Note (Signed)
LCSW Group Therapy Note ? ?Group Date: 05/04/2021 ?Start Time: 1300 ?End Time: 1350 ? ? ?Type of Therapy and Topic:  Group Therapy - Healthy vs Unhealthy Coping Skills ? ?Participation Level:  Did Not Attend  ? ?Description of Group ?The focus of this group was to determine what unhealthy coping techniques typically are used by group members and what healthy coping techniques would be helpful in coping with various problems. Patients were guided in becoming aware of the differences between healthy and unhealthy coping techniques. Patients were asked to identify 2-3 healthy coping skills they would like to learn to use more effectively. ? ?Therapeutic Goals ?Patients learned that coping is what human beings do all day long to deal with various situations in their lives ?Patients defined and discussed healthy vs unhealthy coping techniques ?Patients identified their preferred coping techniques and identified whether these were healthy or unhealthy ?Patients determined 2-3 healthy coping skills they would like to become more familiar with and use more often. ?Patients provided support and ideas to each other ? ? ?Summary of Patient Progress: Patient did not attend group despite encouraged participation. ? ? ? ?Therapeutic Modalities ?Cognitive Behavioral Therapy ?Motivational Interviewing ? ?Ileana Ladd [place], LCSWA ?05/04/2021  3:40 PM   ?

## 2021-05-05 DIAGNOSIS — F312 Bipolar disorder, current episode manic severe with psychotic features: Secondary | ICD-10-CM | POA: Diagnosis not present

## 2021-05-05 NOTE — Progress Notes (Signed)
Patient was found in his room standing next to his bed. Patient states that he feels very weak and cannot get balanced. Patient was assisted to recliner and brought to the dayroom. 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 Patient ate breakfast and was compliant with morning medications. Patient was given a new set of scrubs to change into. He has been in the dayroom watching TV. Patient remains safe on the unit at this time. ?

## 2021-05-05 NOTE — Progress Notes (Signed)
Recreation Therapy Notes ? ? ?Date: 05/05/2021 ? ?Time: 1:35pm    ? ?Location: Courtyard   ? ?Behavioral response: N/A ?  ?Intervention Topic: Emotions   ? ?Discussion/Intervention: ?Patient refused to attend group.  ? ?Clinical Observations/Feedback:  ?Patient refused to attend group.  ?  ?Emmylou Bieker LRT/CTRS ? ? ? ? ? ? ? ? ?Chijioke Lasser ?05/05/2021 3:22 PM ? ? ? ? ? ? ? ?Kelli Robeck ?05/05/2021 3:22 PM ?

## 2021-05-05 NOTE — Progress Notes (Signed)
Patient is very unsteady on his feet. Patient required two staff members to assist him to the bathroom and change.  ?

## 2021-05-05 NOTE — Progress Notes (Signed)
Pt sitting in day room watching tv; calm, cooperative. Pt states "I feel pretty good now. I'm getting married this weekend. She ran in the last presidential election and she's from [place]." He currently denies pain and SI/HI/AH; however, he endorses visual hallucination, stating "I saw my dog on the floor when I woke up" in his room. He reports "I slept until 3 o'clock this afternoon" when asked how he slept last night. He says "I been eating too much" when asked about his appetite. He denies depression and states "I'm just tired"; but says "I'm disgusted because I was told I'd be out of here in 2 days. I've been lied to 4 times." No acute distress noted.  ?

## 2021-05-05 NOTE — Progress Notes (Signed)
[facility] ? ?05/05/2021 11:48 AM ?Austin ReedyMaurice [name]  ?MRN:  [id number] ?Subjective: Austin AlstromMaurice is slowly improving.  He is less agitated and more cooperative.  He still has his angry outbursts and his delusional thinking.  He continues to need as needed medication but not as much.  We will continue to monitor his behavior.  Before I make any adjustments we will get some lab work in the morning. ? ?Principal Problem: Bipolar I disorder, current or most recent episode manic, with psychotic features (HCC) ?Diagnosis: Principal Problem: ?  Bipolar I disorder, current or most recent episode manic, with psychotic features (HCC) ?Active Problems: ?  Severe manic bipolar 1 disorder with psychotic behavior (HCC) ?  Bipolar 1 disorder (HCC) ? ?Total Time spent with patient: 15 minutes ? ?Past Psychiatric History: Extensive ? ?Past Medical History:  ?Past Medical History:  ?Diagnosis Date  ? Bipolar disorder (HCC)   ? Coronary artery disease   ? GERD (gastroesophageal reflux disease)   ? History of multiple strokes   ? PTSD (post-traumatic stress disorder)   ?  ?Past Surgical History:  ?Procedure Laterality Date  ? CARDIAC CATHETERIZATION    ? ?Family History: History reviewed. No pertinent family history. ?Family Psychiatric  History: Unknown ?Social History:  ?Social History  ? ?Substance and Sexual Activity  ?Alcohol Use No  ?   ?Social History  ? ?Substance and Sexual Activity  ?Drug Use Never  ?  ?Social History  ? ?Socioeconomic History  ? Marital status: Divorced  ?  Spouse name: Not on file  ? Number of children: Not on file  ? Years of education: Not on file  ? Highest education level: Not on file  ?Occupational History  ? Not on file  ?Tobacco Use  ? Smoking status: Never  ? Smokeless tobacco: Never  ?Vaping Use  ? Vaping Use: Never used  ?Substance and Sexual Activity  ? Alcohol use: No  ? Drug use: Never  ? Sexual activity: Not on file  ?Other Topics Concern  ? Not on file  ?Social History Narrative  ? Not  on file  ? ?Social Determinants of Health  ? ?Financial Resource Strain: Not on file  ?Food Insecurity: Not on file  ?Transportation Needs: Not on file  ?Physical Activity: Not on file  ?Stress: Not on file  ?Social Connections: Not on file  ? ?Additional Social History:  ?  ?  ?  ?  ?  ?  ?  ?  ?  ?  ?  ? ?Sleep: Fair ? ?Appetite:  Good ? ?Current Medications: ?Current Facility-Administered Medications  ?Medication Dose Route Frequency Provider Last Rate Last Admin  ? acetaminophen (TYLENOL) tablet 650 mg  650 mg Oral Q6H PRN Sarina IllHerrick, Layken Doenges Edward, DO   650 mg at 05/01/21 1515  ? albuterol (PROVENTIL) (2.5 MG/3ML) 0.083% nebulizer solution 3 mL  3 mL Inhalation Q4H PRN Sarina IllHerrick, Hazel Wrinkle Edward, DO      ? alum & mag hydroxide-simeth (MAALOX/MYLANTA) 200-200-20 MG/5ML suspension 30 mL  30 mL Oral Q4H PRN Sarina IllHerrick, Dashan Chizmar Edward, DO      ? chlorproMAZINE (THORAZINE) tablet 50 mg  50 mg Oral Q4H PRN Sarina IllHerrick, Khiana Camino Edward, DO   50 mg at 05/04/21 2203  ? Or  ? chlorproMAZINE (THORAZINE) injection 50 mg  50 mg Intramuscular Q4H PRN Sarina IllHerrick, Asucena Galer Edward, DO   50 mg at 05/01/21 [id number]  ? clopidogrel (PLAVIX) tablet 75 mg  75 mg Oral Daily Sarina IllHerrick, Dandy Lazaro Edward, DO  75 mg at 05/05/21 0905  ? divalproex (DEPAKOTE ER) 24 hr tablet 500 mg  500 mg Oral TID Sarina Ill, DO   500 mg at 05/05/21 6270  ? lamoTRIgine (LAMICTAL) tablet 25 mg  25 mg Oral BID Sarina Ill, DO   25 mg at 05/05/21 3500  ? LORazepam (ATIVAN) tablet 1 mg  1 mg Oral Q4H PRN Sarina Ill, DO   1 mg at 05/05/21 9381  ? losartan (COZAAR) tablet 50 mg  50 mg Oral Daily Sarina Ill, DO   50 mg at 05/05/21 8299  ? magnesium hydroxide (MILK OF MAGNESIA) suspension 30 mL  30 mL Oral Daily PRN Vanetta Mulders, NP      ? QUEtiapine (SEROQUEL) tablet 400 mg  400 mg Oral QHS Sarina Ill, DO   400 mg at 05/04/21 2203  ? rosuvastatin (CRESTOR) tablet 10 mg  10 mg Oral Daily Sarina Ill, DO    10 mg at 05/05/21 3716  ? traZODone (DESYREL) tablet 100 mg  100 mg Oral QHS PRN Sarina Ill, DO   100 mg at 05/04/21 2203  ? ? ?Lab Results: No results found for this or any previous visit (from the past 48 hour(s)). ? ?Blood Alcohol level:  ?Lab Results  ?Component Value Date  ? ETH <10 04/28/2021  ? ETH <10 03/13/2020  ? ? ?Metabolic Disorder Labs: ?Lab Results  ?Component Value Date  ? HGBA1C 5.3 04/28/2021  ? MPG 105.41 04/28/2021  ? MPG 111.15 03/15/2020  ? ?No results found for: PROLACTIN ?Lab Results  ?Component Value Date  ? CHOL 115 03/15/2020  ? TRIG 117 03/15/2020  ? HDL 52 03/15/2020  ? CHOLHDL 2.2 03/15/2020  ? VLDL 23 03/15/2020  ? LDLCALC 40 03/15/2020  ? LDLCALC 53 03/13/2020  ? ? ?Physical Findings: ?AIMS:  , ,  ,  ,    ?CIWA:    ?COWS:    ? ?Musculoskeletal: ?Strength & Muscle Tone: within normal limits ?Gait & Station: unsteady ?Patient leans: N/A ? ?Psychiatric Specialty Exam: ? ?Presentation  ?General Appearance: Disheveled ? ?Eye Contact:Fair ? ?Speech:Slow ? ?Speech Volume:Decreased ? ?Handedness:Right ? ? ?Mood and Affect  ?Mood:Angry; Anxious; Irritable ? ?Affect:Restricted ? ? ?Thought Process  ?Thought Processes:Disorganized ? ?Descriptions of Associations:Loose ? ?Orientation:Partial ? ?Thought Content:Delusions; Illogical; Paranoid Ideation; Perseveration; Scattered ? ?History of Schizophrenia/Schizoaffective disorder:No ? ?Duration of Psychotic Symptoms:Greater than six months ? ?Hallucinations:No data recorded ?Ideas of Reference:Paranoia ? ?Suicidal Thoughts:No data recorded ?Homicidal Thoughts:No data recorded ? ?Sensorium  ?Memory:Immediate Poor; Recent Poor; Remote Poor ? ?Judgment:Impaired ? ?Insight:Poor ? ? ?Executive Functions  ?Concentration:Poor ? ?Attention Span:Poor ? ?Recall:Poor ? ?Fund of Knowledge:Poor ? ?Language:Poor ? ? ?Psychomotor Activity  ?Psychomotor Activity:No data recorded ? ?Assets  ?Assets:Resilience ? ? ?Sleep  ?Sleep:No data  recorded ? ? ?Physical Exam: ?Physical Exam ?Vitals and nursing note reviewed.  ?Constitutional:   ?   Appearance: Normal appearance. He is normal weight.  ?Neurological:  ?   General: No focal deficit present.  ?   Mental Status: He is alert and oriented to person, place, and time.  ?Psychiatric:     ?   Mood and Affect: Mood normal.     ?   Behavior: Behavior normal.  ? ?Review of Systems  ?Constitutional: Negative.   ?HENT: Negative.    ?Eyes: Negative.   ?Respiratory: Negative.    ?Cardiovascular: Negative.   ?Gastrointestinal: Negative.   ?Genitourinary: Negative.   ?Musculoskeletal: Negative.   ?Skin:  Negative.   ?Neurological: Negative.   ?Endo/Heme/Allergies: Negative.   ?Psychiatric/Behavioral: Negative.    ?Blood pressure 113/71, pulse (!) 102, temperature 97.8 ?F (36.6 ?C), temperature source Oral, resp. rate 18, height 5\' 8"  (1.727 m), weight 76.2 kg, SpO2 99 %. Body mass index is 25.54 kg/m?. ? ? ?Treatment Plan Summary: ?Daily contact with patient to assess and evaluate symptoms and progress in treatment, Medication management, and Plan discontinue potassium.  Hemoglobin A1c and lipid panel in the morning along with Depakote level. ? ? , DO ?05/05/2021, 11:48 AM ? ?

## 2021-05-05 NOTE — BH IP Treatment Plan (Signed)
Interdisciplinary Treatment and Diagnostic Plan Update ? ?05/05/2021 ?Time of Session: 4431 ?Austin Rhodes ?MRN: [id number] ? ?Principal Diagnosis: Bipolar I disorder, current or most recent episode manic, with psychotic features ([place]) ? ?Secondary Diagnoses: Principal Problem: ?  Bipolar I disorder, current or most recent episode manic, with psychotic features ([place]) ?Active Problems: ?  Severe manic bipolar 1 disorder with psychotic behavior ([place]) ?  Bipolar 1 disorder ([place]) ? ? ?Current Medications:  ?Current Facility-Administered Medications  ?Medication Dose Route Frequency Provider Last Rate Last Admin  ? acetaminophen (TYLENOL) tablet 650 mg  650 mg Oral Q6H PRN Parks Ranger, DO   650 mg at 05/01/21 1515  ? albuterol (PROVENTIL) (2.5 MG/3ML) 0.083% nebulizer solution 3 mL  3 mL Inhalation Q4H PRN Parks Ranger, DO      ? alum & mag hydroxide-simeth (MAALOX/MYLANTA) 200-200-20 MG/5ML suspension 30 mL  30 mL Oral Q4H PRN Parks Ranger, DO      ? chlorproMAZINE (THORAZINE) tablet 50 mg  50 mg Oral Q4H PRN Parks Ranger, DO   50 mg at 05/04/21 2203  ? Or  ? chlorproMAZINE (THORAZINE) injection 50 mg  50 mg Intramuscular Q4H PRN Parks Ranger, DO   50 mg at 05/01/21 9509  ? clopidogrel (PLAVIX) tablet 75 mg  75 mg Oral Daily Parks Ranger, DO   75 mg at 05/05/21 3267  ? divalproex (DEPAKOTE ER) 24 hr tablet 500 mg  500 mg Oral TID Parks Ranger, DO   500 mg at 05/05/21 1245  ? lamoTRIgine (LAMICTAL) tablet 25 mg  25 mg Oral BID Parks Ranger, DO   25 mg at 05/05/21 8099  ? LORazepam (ATIVAN) tablet 1 mg  1 mg Oral Q4H PRN Parks Ranger, DO   1 mg at 05/05/21 8338  ? losartan (COZAAR) tablet 50 mg  50 mg Oral Daily Parks Ranger, DO   50 mg at 05/05/21 2505  ? magnesium hydroxide (MILK OF MAGNESIA) suspension 30 mL  30 mL Oral Daily PRN Sherlon Handing, NP      ? QUEtiapine (SEROQUEL) tablet 400 mg  400 mg  Oral QHS Parks Ranger, DO   400 mg at 05/04/21 2203  ? rosuvastatin (CRESTOR) tablet 10 mg  10 mg Oral Daily Parks Ranger, DO   10 mg at 05/05/21 3976  ? traZODone (DESYREL) tablet 100 mg  100 mg Oral QHS PRN Parks Ranger, DO   100 mg at 05/04/21 2203  ? ?PTA Medications: ?Medications Prior to Admission  ?Medication Sig Dispense Refill Last Dose  ? acetaminophen (TYLENOL) 325 MG tablet Take 2 tablets (650 mg total) by mouth every 6 (six) hours as needed for mild pain or moderate pain. 30 tablet 0   ? albuterol (VENTOLIN HFA) 108 (90 Base) MCG/ACT inhaler Inhale 1-2 puffs into the lungs every 4 (four) hours as needed for wheezing or shortness of breath. 1 each 0   ? amoxicillin-clavulanate (AUGMENTIN) 875-125 MG tablet Take 1 tablet by mouth 2 (two) times daily. X 7 days 14 tablet 0   ? clopidogrel (PLAVIX) 75 MG tablet Take 1 tablet (75 mg total) by mouth daily. 30 tablet 1   ? divalproex (DEPAKOTE ER) 500 MG 24 hr tablet Take 500 mg by mouth 3 (three) times daily.     ? divalproex (DEPAKOTE) 500 MG DR tablet Take 1 tablet (500 mg total) by mouth every morning AND 2 tablets (1,000 mg total) at bedtime. [place]  tablet 1   ? fluticasone (FLONASE) 50 MCG/ACT nasal spray Place 2 sprays into both nostrils daily. 16 g 0   ? hydrOXYzine (ATARAX/VISTARIL) 50 MG tablet Take 1 tablet (50 mg total) by mouth 3 (three) times daily as needed for anxiety. 30 tablet 1   ? lamoTRIgine (LAMICTAL) 25 MG tablet Take by mouth.     ? Lamotrigine 35 x 25 MG KIT Take 25-100 mg by mouth as directed. 1 kit 0   ? metoprolol succinate (TOPROL-XL) 25 MG 24 hr tablet Take 25 mg by mouth daily.     ? QUEtiapine (SEROQUEL) 400 MG tablet Take 1 tablet (400 mg total) by mouth at bedtime. 30 tablet 1   ? rosuvastatin (CRESTOR) 10 MG tablet Take 1 tablet (10 mg total) by mouth daily. 30 tablet 1   ? Spacer/Aero-Holding Chambers (AEROCHAMBER PLUS) inhaler Use with inhaler 1 each 2   ? tamsulosin (FLOMAX) 0.4 MG CAPS capsule  Take 1 capsule (0.4 mg total) by mouth daily after supper. 30 capsule 1   ? ? ?Patient Stressors: Other: Unable to assess due to manic state   ? ?Patient Strengths: Active sense of humor  ?Communication skills  ? ?Treatment Modalities: Medication Management, Group therapy, Case management,  ?1 to 1 session with clinician, Psychoeducation, Recreational therapy. ? ? ?Physician Treatment Plan for Primary Diagnosis: Bipolar I disorder, current or most recent episode manic, with psychotic features ([place]) ?Long Term Goal(s): Improvement in symptoms so as ready for discharge  ? ?Short Term Goals: Ability to identify changes in lifestyle to reduce recurrence of condition will improve ?Ability to verbalize feelings will improve ?Ability to disclose and discuss suicidal ideas ?Ability to demonstrate self-control will improve ?Ability to identify and develop effective coping behaviors will improve ?Ability to maintain clinical measurements within normal limits will improve ?Compliance with prescribed medications will improve ?Ability to identify triggers associated with substance abuse/mental health issues will improve ? ?Medication Management: Evaluate patient's response, side effects, and tolerance of medication regimen. ? ?Therapeutic Interventions: 1 to 1 sessions, Unit Group sessions and Medication administration. ? ?Evaluation of Outcomes: Not Met ? ?Physician Treatment Plan for Secondary Diagnosis: Principal Problem: ?  Bipolar I disorder, current or most recent episode manic, with psychotic features ([place]) ?Active Problems: ?  Severe manic bipolar 1 disorder with psychotic behavior ([place]) ?  Bipolar 1 disorder ([place]) ? ?Long Term Goal(s): Improvement in symptoms so as ready for discharge  ? ?Short Term Goals: Ability to identify changes in lifestyle to reduce recurrence of condition will improve ?Ability to verbalize feelings will improve ?Ability to disclose and discuss suicidal ideas ?Ability to demonstrate self-control  will improve ?Ability to identify and develop effective coping behaviors will improve ?Ability to maintain clinical measurements within normal limits will improve ?Compliance with prescribed medications will improve ?Ability to identify triggers associated with substance abuse/mental health issues will improve    ? ?Medication Management: Evaluate patient's response, side effects, and tolerance of medication regimen. ? ?Therapeutic Interventions: 1 to 1 sessions, Unit Group sessions and Medication administration. ? ?Evaluation of Outcomes: Not Met ? ? ?RN Treatment Plan for Primary Diagnosis: Bipolar I disorder, current or most recent episode manic, with psychotic features ([place]) ?Long Term Goal(s): Knowledge of disease and therapeutic regimen to maintain health will improve ? ?Short Term Goals: Ability to remain free from injury will improve, Ability to verbalize frustration and anger appropriately will improve, Ability to demonstrate self-control, Ability to participate in decision making will improve, Ability to verbalize  feelings will improve, Ability to disclose and discuss suicidal ideas, Ability to identify and develop effective coping behaviors will improve, and Compliance with prescribed medications will improve ? ?Medication Management: RN will administer medications as ordered by provider, will assess and evaluate patient's response and provide education to patient for prescribed medication. RN will report any adverse and/or side effects to prescribing provider. ? ?Therapeutic Interventions: 1 on 1 counseling sessions, Psychoeducation, Medication administration, Evaluate responses to treatment, Monitor vital signs and CBGs as ordered, Perform/monitor CIWA, COWS, AIMS and Fall Risk screenings as ordered, Perform wound care treatments as ordered. ? ?Evaluation of Outcomes: Not Met ? ? ?LCSW Treatment Plan for Primary Diagnosis: Bipolar I disorder, current or most recent episode manic, with psychotic features  ([place]) ?Long Term Goal(s): Safe transition to appropriate next level of care at discharge, Engage patient in therapeutic group addressing interpersonal concerns. ? ?Short Term Goals: Engage patient in a

## 2021-05-05 NOTE — Progress Notes (Signed)
Pt sitting at table in day room; calm, cooperative. Pt states "I feel like going fishing." He denies pain but c/o hands feeling "numb" and says "I'll be okay." He currently denies SI/HI/AVH. He reports that he slept "four times" last night. He states "I have no appetite. I'm eating 5 snacks." He denies anxiety and depression at this time. Pt states "I'm getting married in the next few days." When asked who he is getting married to, he says "I got it narrowed down to one." No acute distress noted. ?

## 2021-05-05 NOTE — Group Note (Signed)
[facility] LCSW Group Therapy Note ? ? ? ?Group Date: 05/05/2021 ?Start Time: 1430 ?End Time: 1530 ? ?Type of Therapy and Topic:  Group Therapy:  Overcoming Obstacles ? ?Participation Level:  [facility] PARTICIPATION LEVEL: Did Not Attend ? ?Mood: ? ?Description of Group:   ?In this group patients will be encouraged to explore what they see as obstacles to their own wellness and recovery. They will be guided to discuss their thoughts, feelings, and behaviors related to these obstacles. The group will process together ways to cope with barriers, with attention given to specific choices patients can make. Each patient will be challenged to identify changes they are motivated to make in order to overcome their obstacles. This group will be process-oriented, with patients participating in exploration of their own experiences as well as giving and receiving support and challenge from other group members. ? ?Therapeutic Goals: ?1. Patient will identify personal and current obstacles as they relate to admission. ?2. Patient will identify barriers that currently interfere with their wellness or overcoming obstacles.  ?3. Patient will identify feelings, thought process and behaviors related to these barriers. ?4. Patient will identify two changes they are willing to make to overcome these obstacles:  ? ? ?Summary of Patient Progress ? ? ?Patient did not attend group despite encouraged participation.  ? ? ?Therapeutic Modalities:   ?Cognitive Behavioral Therapy ?Solution Focused Therapy ?Motivational Interviewing ?Relapse Prevention Therapy ? ? ?Azarah Dacy W Wolfe [place], LCSWA ?

## 2021-05-06 DIAGNOSIS — F312 Bipolar disorder, current episode manic severe with psychotic features: Secondary | ICD-10-CM | POA: Diagnosis not present

## 2021-05-06 LAB — LIPID PANEL
Cholesterol: 103 mg/dL (ref 0–200)
HDL: 41 mg/dL (ref >40)
LDL Cholesterol: 49 mg/dL (ref 0–99)
Total CHOL/HDL Ratio: 2.5 RATIO
Triglycerides: 66 mg/dL (ref <150)
VLDL: 13 mg/dL (ref 0–40)

## 2021-05-06 LAB — VALPROIC ACID LEVEL: Valproic Acid Lvl: 92 ug/mL (ref 50.0–100.0)

## 2021-05-06 MED ORDER — LAMOTRIGINE 25 MG PO TABS
50.0000 mg | ORAL_TABLET | Freq: Two times a day (BID) | ORAL | Status: DC
Start: 2021-05-06 — End: 2021-06-02
  Administered 2021-05-07 – 2021-06-02 (×53): 50 mg via ORAL
  Filled 2021-05-06 (×54): qty 2

## 2021-05-06 NOTE — [place] FL2 (Addendum)
? MEDICAID FL2 LEVEL OF CARE SCREENING TOOL  ?  ? ?IDENTIFICATION  ?Patient Name: ?Austin Rhodes Birthdate: December 02, 1950 Sex: male Admission Date (Current Location): ?04/29/2021  ?[place] Number: ?  ?  Facility and Address:  ?[facility], [address] [place], [place] [address] ?     Provider Number: ?[id number]  ?Attending Physician Name and Address:  ?Sarina Ill,* ? Relative Name and Phone Number:  ?Holger Sokolowski, son, [phone] ?   ?Current Level of Care: ?Hospital Recommended Level of Care: ?Family Care Home, Nursing Facility, Assisted Living Facility Prior Approval Number: ?  ? ?Date Approved/Denied: ?  PASRR Number: ?  ? ?Discharge Plan: ?Other (Comment) (Nursing facility, family care home, assisted living facility) ?  ? ?Current Diagnoses: ?Patient Active Problem List  ? Diagnosis Date Noted  ? Severe manic bipolar 1 disorder with psychotic behavior (HCC) 04/29/2021  ? Bipolar 1 disorder (HCC) 04/29/2021  ? Bipolar affective disorder, current episode manic with psychotic symptoms (HCC) 03/15/2020  ? HLD (hyperlipidemia) 03/28/2019  ? SVT (supraventricular tachycardia) (HCC) 03/28/2019  ? Bipolar disorder (HCC) 12/26/2018  ? Head injury 12/25/2018  ? Paranoid schizophrenia (HCC) 12/25/2018  ? Schizophrenia, paranoid (HCC)   ? Acute encephalopathy   ? Affective psychosis, bipolar (HCC) 12/18/2018  ? HTN (hypertension) 11/01/2017  ? Bipolar I disorder, current or most recent episode manic, with psychotic features (HCC) 10/31/2017  ? CAD (coronary artery disease) 08/31/2016  ? GERD (gastroesophageal reflux disease) 08/31/2016  ? History of CVA (cerebrovascular accident) 08/31/2016  ? History of syncope 08/31/2016  ? History of thrombocytopenia 08/31/2016  ? Prediabetes 08/31/2016  ? BPH (benign prostatic hyperplasia) 08/31/2016  ? Chest pain 05/09/2015  ? ? ?Orientation RESPIRATION BLADDER Height & Weight   ?  ?Self ? Normal Continent  (Periods of incontinence with urine) Weight: 168 lb (76.2 kg) ?Height:  5\' 8"  (172.7 cm)  ?BEHAVIORAL SYMPTOMS/MOOD NEUROLOGICAL BOWEL NUTRITION STATUS  ?  (N/A) Continent  (N/A)  ?AMBULATORY STATUS COMMUNICATION OF NEEDS Skin   ?Independent Verbally Normal ?  ?  ?  ?    ?Pt occasionally used rolling walker with 2 wheels on the unit     ?     ? ? ?Personal Care Assistance Level of Assistance  ?Bathing Bathing Assistance: Limited assistance ? Patient can bathe and clothe himself, just needs bathing items like toiletries and bath cloth, towel set out and prepared ?  ?   ? ?Functional Limitations Info  ?Sight Sight Info: Impaired (wears glasses) ?  ?   ? ? ?SPECIAL CARE FACTORS FREQUENCY  ? (N/A)   ?  ?  ?  ?  ?  ?  ?   ? ? ?Contractures Contractures Info: Not present  ? ? ?Additional Factors Info  ?Code Status Code Status Info: FULL ?  ?  ?  ?  ?   ? ?Current Medications (05/06/2021):  This is the current hospital active medication list ?Current Facility-Administered Medications  ?Medication Dose Route Frequency Provider Last Rate Last Admin  ? acetaminophen (TYLENOL) tablet 650 mg  650 mg Oral Q6H PRN 07/06/2021, DO   650 mg at 05/01/21 1515  ? albuterol (PROVENTIL) (2.5 MG/3ML) 0.083% nebulizer solution 3 mL  3 mL Inhalation Q4H PRN 05/03/21, DO      ? alum & mag hydroxide-simeth (MAALOX/MYLANTA) 200-200-20 MG/5ML suspension 30 mL  30 mL Oral Q4H PRN 08-11-2000, DO      ? chlorproMAZINE (  THORAZINE) tablet 50 mg  50 mg Oral Q4H PRN Sarina Ill, DO   50 mg at 05/05/21 2119  ? Or  ? chlorproMAZINE (THORAZINE) injection 50 mg  50 mg Intramuscular Q4H PRN Sarina Ill, DO   50 mg at 05/01/21 7793  ? clopidogrel (PLAVIX) tablet 75 mg  75 mg Oral Daily Sarina Ill, DO   75 mg at 05/06/21 1111  ? divalproex (DEPAKOTE ER) 24 hr tablet 500 mg  500 mg Oral TID Sarina Ill, DO   500 mg at 05/06/21 1111  ? lamoTRIgine (LAMICTAL) tablet 25 mg   25 mg Oral BID Sarina Ill, DO   25 mg at 05/06/21 1112  ? LORazepam (ATIVAN) tablet 1 mg  1 mg Oral Q4H PRN Sarina Ill, DO   1 mg at 05/05/21 9030  ? losartan (COZAAR) tablet 50 mg  50 mg Oral Daily Sarina Ill, DO   50 mg at 05/06/21 1111  ? magnesium hydroxide (MILK OF MAGNESIA) suspension 30 mL  30 mL Oral Daily PRN Vanetta Mulders, NP      ? QUEtiapine (SEROQUEL) tablet 400 mg  400 mg Oral QHS Sarina Ill, DO   400 mg at 05/05/21 2119  ? rosuvastatin (CRESTOR) tablet 10 mg  10 mg Oral Daily Sarina Ill, DO   10 mg at 05/06/21 1112  ? traZODone (DESYREL) tablet 100 mg  100 mg Oral QHS PRN Sarina Ill, DO   100 mg at 05/05/21 2119  ? ? ? ?Discharge Medications: ?Please see discharge summary for a list of discharge medications. ? ?Relevant Imaging Results: ? ?Relevant Lab Results: ? ? ?Additional Information ?  ? ?Tymira Horkey A [place], LCSWA ? ? ? ? ?

## 2021-05-06 NOTE — Progress Notes (Signed)
Patient currently sitting in the day room among peers eating snack. Currently denies SI, HI, AVH. Denies depression but endorsed anxiety of 3/10, per patient his anxious about going home. Ate meals in the day room and tolerated well. Compliant with due medication. Patient remain with unsteady gait. Remain safe on the unit with Q15 minute safety check. ?

## 2021-05-06 NOTE — Progress Notes (Signed)
[facility] MD Progress Note ? ?05/06/2021 12:06 PM ?Austin Rhodes  ?MRN:  [id number] ?Subjective: Austin Rhodes behavior is slowly improving although he still having hallucinations according to him and agitation.  He is compliant with his medications and denies any side effects.  His Depakote level was 92.  His son is his guardian and is looking for a place for him to stay.  He apparently has been evicted from his current apartment.  He denies any suicidal ideation.  He remains delusional. ? ?Principal Problem: Bipolar I disorder, current or most recent episode manic, with psychotic features ([place]) ?Diagnosis: Principal Problem: ?  Bipolar I disorder, current or most recent episode manic, with psychotic features ([place]) ?Active Problems: ?  Severe manic bipolar 1 disorder with psychotic behavior ([place]) ?  Bipolar 1 disorder ([place]) ? ?Total Time spent with patient: 15 minutes ? ?Past Psychiatric History: Multiple hospitalizations for bipolar 1 mania. [employer]  ? ?Past Medical History:  ?Past Medical History:  ?Diagnosis Date  ? Bipolar disorder ([place])   ? Coronary artery disease   ? GERD (gastroesophageal reflux disease)   ? History of multiple strokes   ? PTSD (post-traumatic stress disorder)   ?  ?Past Surgical History:  ?Procedure Laterality Date  ? CARDIAC CATHETERIZATION    ? ?Family History: History reviewed. No pertinent family history. ? ?Social History:  ?Social History  ? ?Substance and Sexual Activity  ?Alcohol Use No  ?   ?Social History  ? ?Substance and Sexual Activity  ?Drug Use Never  ?  ?Social History  ? ?Socioeconomic History  ? Marital status: Divorced  ?  Spouse name: Not on file  ? Number of children: Not on file  ? Years of education: Not on file  ? Highest education level: Not on file  ?Occupational History  ? Not on file  ?Tobacco Use  ? Smoking status: Never  ? Smokeless tobacco: Never  ?Vaping Use  ? Vaping Use: Never used  ?Substance and Sexual Activity  ? Alcohol use: No  ? Drug  use: Never  ? Sexual activity: Not on file  ?Other Topics Concern  ? Not on file  ?Social History Narrative  ? Not on file  ? ?Social Determinants of Health  ? ?Financial Resource Strain: Not on file  ?Food Insecurity: Not on file  ?Transportation Needs: Not on file  ?Physical Activity: Not on file  ?Stress: Not on file  ?Social Connections: Not on file  ? ?Additional Social History:  ?  ?  ?  ?  ?  ?  ?  ?  ?  ?  ?  ? ?Sleep: Fair ? ?Appetite:  Good ? ?Current Medications: ?Current Facility-Administered Medications  ?Medication Dose Route Frequency Provider Last Rate Last Admin  ? acetaminophen (TYLENOL) tablet 650 mg  650 mg Oral Q6H PRN Parks Ranger, DO   650 mg at 05/01/21 1515  ? albuterol (PROVENTIL) (2.5 MG/3ML) 0.083% nebulizer solution 3 mL  3 mL Inhalation Q4H PRN Parks Ranger, DO      ? alum & mag hydroxide-simeth (MAALOX/MYLANTA) 200-200-20 MG/5ML suspension 30 mL  30 mL Oral Q4H PRN Parks Ranger, DO      ? chlorproMAZINE (THORAZINE) tablet 50 mg  50 mg Oral Q4H PRN Parks Ranger, DO   50 mg at 05/05/21 2119  ? Or  ? chlorproMAZINE (THORAZINE) injection 50 mg  50 mg Intramuscular Q4H PRN Parks Ranger, DO   50 mg at 05/01/21 [id number]  ?  clopidogrel (PLAVIX) tablet 75 mg  75 mg Oral Daily Parks Ranger, DO   75 mg at 05/06/21 1111  ? divalproex (DEPAKOTE ER) 24 hr tablet 500 mg  500 mg Oral TID Parks Ranger, DO   500 mg at 05/06/21 1111  ? lamoTRIgine (LAMICTAL) tablet 25 mg  25 mg Oral BID Parks Ranger, DO   25 mg at 05/06/21 1112  ? LORazepam (ATIVAN) tablet 1 mg  1 mg Oral Q4H PRN Parks Ranger, DO   1 mg at 05/05/21 [id number]  ? losartan (COZAAR) tablet 50 mg  50 mg Oral Daily Parks Ranger, DO   50 mg at 05/06/21 1111  ? magnesium hydroxide (MILK OF MAGNESIA) suspension 30 mL  30 mL Oral Daily PRN Sherlon Handing, NP      ? QUEtiapine (SEROQUEL) tablet 400 mg  400 mg Oral QHS Parks Ranger, DO    400 mg at 05/05/21 2119  ? rosuvastatin (CRESTOR) tablet 10 mg  10 mg Oral Daily Parks Ranger, DO   10 mg at 05/06/21 1112  ? traZODone (DESYREL) tablet 100 mg  100 mg Oral QHS PRN Parks Ranger, DO   100 mg at 05/05/21 2119  ? ? ?Lab Results:  ?Results for orders placed or performed during the hospital encounter of 04/29/21 (from the past 48 hour(s))  ?Valproic acid level     Status: None  ? Collection Time: 05/06/21  6:33 AM  ?Result Value Ref Range  ? Valproic Acid Lvl 92 50.0 - 100.0 ug/mL  ?  Comment: Performed at [facility], [address], [place], [place]  ?Lipid panel     Status: None  ? Collection Time: 05/06/21  6:33 AM  ?Result Value Ref Range  ? Cholesterol 103 0 - 200 mg/dL  ? Triglycerides 66 <150 mg/dL  ? HDL 41 >40 mg/dL  ? Total CHOL/HDL Ratio 2.5 RATIO  ? VLDL 13 0 - 40 mg/dL  ? LDL Cholesterol 49 0 - 99 mg/dL  ?  Comment:        ?Total Cholesterol/HDL:CHD Risk ?Coronary Heart Disease Risk Table ?                    Men   Women ? 1/2 Average Risk   3.4   3.3 ? Average Risk       5.0   4.4 ? 2 X Average Risk   9.6   7.1 ? 3 X Average Risk  23.4   11.0 ?       ?Use the calculated Patient Ratio ?above and the CHD Risk Table ?to determine the patient's CHD Risk. ?       ?ATP III CLASSIFICATION (LDL): ? <100     mg/dL   Optimal ? 100-129  mg/dL   Near or Above ?                   Optimal ? 130-159  mg/dL   Borderline ? 160-189  mg/dL   High ? >190     mg/dL   Very High ?Performed at [facility], [address] [place], [place] [address] ?  ? ? ?Blood Alcohol level:  ?Lab Results  ?Component Value Date  ? ETH <10 04/28/2021  ? ETH <10 03/13/2020  ? ? ?Metabolic Disorder Labs: ?Lab Results  ?Component Value Date  ? HGBA1C 5.3 04/28/2021  ? MPG 105.41 04/28/2021  ? MPG 111.15 03/15/2020  ? ?  No results found for: PROLACTIN ?Lab Results  ?Component Value Date  ? CHOL 103 05/06/2021  ? TRIG 66 05/06/2021  ? HDL 41 05/06/2021  ? CHOLHDL 2.5 05/06/2021   ? VLDL 13 05/06/2021  ? LDLCALC 49 05/06/2021  ? [place] 40 03/15/2020  ? ? ?Physical Findings: ?AIMS:  , ,  ,  ,    ?CIWA:    ?COWS:    ? ?Musculoskeletal: ?Strength & Muscle Tone: within normal limits ?Gait & Station: normal ?Patient leans: N/A ? ?Psychiatric Specialty Exam: ? ?Presentation  ?General Appearance: Disheveled ? ?Eye Contact:Fair ? ?Speech:Slow ? ?Speech Volume:Decreased ? ?Handedness:Right ? ? ?Mood and Affect  ?Mood:Angry; Anxious; Irritable ? ?Affect:Restricted ? ? ?Thought Process  ?Thought Processes:Disorganized ? ?Descriptions of Associations:Loose ? ?Orientation:Partial ? ?Thought Content:Delusions; Illogical; Paranoid Ideation; Perseveration; Scattered ? ?History of Schizophrenia/Schizoaffective disorder:No ? ?Duration of Psychotic Symptoms:Greater than six months ? ?Hallucinations:No data recorded ?Ideas of Reference:Paranoia ? ?Suicidal Thoughts:No data recorded ?Homicidal Thoughts:No data recorded ? ?Sensorium  ?Memory:Immediate Poor; Recent Poor; Remote Poor ? ?Judgment:Impaired ? ?Insight:Poor ? ? ?Executive Functions  ?Concentration:Poor ? ?Attention Span:Poor ? ?Recall:Poor ? ?Fund of Knowledge:Poor ? ?Language:Poor ? ? ?Psychomotor Activity  ?Psychomotor Activity:No data recorded ? ?Assets  ?Assets:Resilience ? ? ?Sleep  ?Sleep:No data recorded ? ? ?Physical Exam: ?Physical Exam ?Vitals and nursing note reviewed.  ?Constitutional:   ?   Appearance: Normal appearance. He is normal weight.  ?Neurological:  ?   General: No focal deficit present.  ?   Mental Status: He is alert and oriented to person, place, and time.  ?Psychiatric:     ?   Attention and Perception: Attention and perception normal.     ?   Mood and Affect: Mood normal. Affect is labile.     ?   Speech: Speech is tangential.     ?   Behavior: Behavior normal. Behavior is cooperative.     ?   Thought Content: Thought content is delusional.     ?   Cognition and Memory: Cognition and memory normal.     ?   Judgment: Judgment  is impulsive and inappropriate.  ? ?Review of Systems  ?Constitutional: Negative.   ?HENT: Negative.    ?Eyes: Negative.   ?Respiratory: Negative.    ?Cardiovascular: Negative.   ?Gastrointestinal: N

## 2021-05-06 NOTE — Progress Notes (Signed)
Recreation Therapy Notes ? ?Date: 05/06/2021 ?  ?Time: 1:05 pm    ?  ?Location: Court yard  ?  ?Behavioral response: Appropriate ?  ?Intervention Topic:  Leisure   ?  ?Discussion/Intervention:  ?Group content today was focused on leisure. The group defined what leisure is and some positive leisure activities they participate in. Individuals identified the difference between good and bad leisure. Participants expressed how they feel after participating in the leisure of their choice. The group discussed how they go about picking a leisure activity and if others are involved in their leisure activities. The patient stated how many leisure activities they have to choose from and reasons why it is important to have leisure time. Individuals participated in the intervention ?Exploration of Leisure? where they had a chance to identify new leisure activities as well as benefits of leisure. ?Clinical Observations/Feedback: ?Patient came to group late and was focused on what peers and staff had to say about leisure. Participant participated in open leisure while being appropriate with staff and peers. Individual was social with peers and staff while participating in the intervention.    ?Falcon Mccaskey LRT/CTRS  ? ? ? ? ? ? ? ?Aleria Maheu ?05/06/2021 2:01 PM ?

## 2021-05-06 NOTE — BHH Counselor (Signed)
CSW sent FL2 to [facility] for potential admission.  ? ?Loss adjuster, chartered pt's son stated that they had an opening and spoke with Aura Camps in admissions regarding request for [facility].  ? ?CSW will follow up with [facility] and pt's son regarding housing admission update.  ? ?Eivin Mascio [place], MSW, LCSW-A ?4/4/202312:28 PM  ?

## 2021-05-06 NOTE — BHH Group Notes (Signed)
BHH Group Notes:  (Nursing/MHT/Case Management/Adjunct) ? ?Date:  05/06/2021  ?Time:  10:00 AM ? ?Type of Therapy:  Psychoeducational Skills ? ?Participation Level:  Did Not Attend ? ?Participation Quality:   N/A ? ?Affect:   N/A ? ?Cognitive:   N/A ? ?Insight:  None ? ?Engagement in Group:   N/A ? ?Modes of Intervention:   N/A ? ?Summary of Progress/Problems: ?Pt was in bed sleeping during group. ?Barbaraann Rondo ?05/06/2021, 4:48 PM ?

## 2021-05-06 NOTE — Progress Notes (Signed)
?   05/06/21 1800  ?Clinical Encounter Type  ?Visited With Patient  ?Visit Type Initial;Psychological support  ?Referral From Nurse  ? ?Chaplain provided support through reflective listening and compassionate presence. ?

## 2021-05-07 DIAGNOSIS — F312 Bipolar disorder, current episode manic severe with psychotic features: Secondary | ICD-10-CM | POA: Diagnosis not present

## 2021-05-07 LAB — HEMOGLOBIN A1C
Hgb A1c MFr Bld: 5.7 % — ABNORMAL HIGH (ref 4.8–5.6)
Mean Plasma Glucose: 117 mg/dL

## 2021-05-07 NOTE — Progress Notes (Signed)
Pt lying in bed with eyes closed; appears to be asleep. Unable to arouse pt to safely take oral medications with concern for risk for aspiration. All nighttime medications held. No acute distress noted. ?

## 2021-05-07 NOTE — Progress Notes (Signed)
[facility] MD Progress Note ? ?05/07/2021 11:41 AM ?Austin Rhodes  ?MRN:  [id number] ?Subjective: Austin Rhodes was seen on rounds today.  His behavior is much improved.  He is still delusional but much more pleasant and cooperative.  The nurses tell me that he is having more trouble walking.  He is using a walker which is not he is.  He states that he does not use a walker at home.  He did come in to the hospital ambulating freely with his using a walker now so I think we will consult PT.  He denies any side effects from his medications and he has been compliant with them.  His son is looking for an apartment for him since he was evicted from his last 1. ? ?Principal Problem: Bipolar I disorder, current or most recent episode manic, with psychotic features (HCC) ?Diagnosis: Principal Problem: ?  Bipolar I disorder, current or most recent episode manic, with psychotic features (HCC) ?Active Problems: ?  Severe manic bipolar 1 disorder with psychotic behavior (HCC) ?  Bipolar 1 disorder (HCC) ? ?Total Time spent with patient: 15 minutes ? ?Past Psychiatric History: Extensive:  ?Multiple hospitalizations for bipolar 1 mania. [employer]  ?Past Medical History:  ?Past Medical History:  ?Diagnosis Date  ? Bipolar disorder (HCC)   ? Coronary artery disease   ? GERD (gastroesophageal reflux disease)   ? History of multiple strokes   ? PTSD (post-traumatic stress disorder)   ?  ?Past Surgical History:  ?Procedure Laterality Date  ? CARDIAC CATHETERIZATION    ? ?Family History: History reviewed. No pertinent family history. ? ?Social History:  ?Social History  ? ?Substance and Sexual Activity  ?Alcohol Use No  ?   ?Social History  ? ?Substance and Sexual Activity  ?Drug Use Never  ?  ?Social History  ? ?Socioeconomic History  ? Marital status: Divorced  ?  Spouse name: Not on file  ? Number of children: Not on file  ? Years of education: Not on file  ? Highest education level: Not on file  ?Occupational History  ? Not  on file  ?Tobacco Use  ? Smoking status: Never  ? Smokeless tobacco: Never  ?Vaping Use  ? Vaping Use: Never used  ?Substance and Sexual Activity  ? Alcohol use: No  ? Drug use: Never  ? Sexual activity: Not on file  ?Other Topics Concern  ? Not on file  ?Social History Narrative  ? Not on file  ? ?Social Determinants of Health  ? ?Financial Resource Strain: Not on file  ?Food Insecurity: Not on file  ?Transportation Needs: Not on file  ?Physical Activity: Not on file  ?Stress: Not on file  ?Social Connections: Not on file  ? ?Additional Social History:  ?  ?  ?  ?  ?  ?  ?  ?  ?  ?  ?  ? ?Sleep: Good ? ?Appetite:  Good ? ?Current Medications: ?Current Facility-Administered Medications  ?Medication Dose Route Frequency Provider Last Rate Last Admin  ? acetaminophen (TYLENOL) tablet 650 mg  650 mg Oral Q6H PRN Sarina Ill, DO   650 mg at 05/01/21 1515  ? albuterol (PROVENTIL) (2.5 MG/3ML) 0.083% nebulizer solution 3 mL  3 mL Inhalation Q4H PRN Sarina Ill, DO      ? alum & mag hydroxide-simeth (MAALOX/MYLANTA) 200-200-20 MG/5ML suspension 30 mL  30 mL Oral Q4H PRN Sarina Ill, DO      ? chlorproMAZINE (THORAZINE) tablet  50 mg  50 mg Oral Q4H PRN Sarina Ill, DO   50 mg at 05/05/21 2119  ? Or  ? chlorproMAZINE (THORAZINE) injection 50 mg  50 mg Intramuscular Q4H PRN Sarina Ill, DO   50 mg at 05/01/21 3662  ? clopidogrel (PLAVIX) tablet 75 mg  75 mg Oral Daily Sarina Ill, DO   75 mg at 05/07/21 9476  ? divalproex (DEPAKOTE ER) 24 hr tablet 500 mg  500 mg Oral TID Sarina Ill, DO   500 mg at 05/07/21 5465  ? lamoTRIgine (LAMICTAL) tablet 50 mg  50 mg Oral BID Sarina Ill, DO   50 mg at 05/07/21 0354  ? LORazepam (ATIVAN) tablet 1 mg  1 mg Oral Q4H PRN Sarina Ill, DO   1 mg at 05/05/21 6568  ? losartan (COZAAR) tablet 50 mg  50 mg Oral Daily Sarina Ill, DO   50 mg at 05/07/21 1275  ? magnesium  hydroxide (MILK OF MAGNESIA) suspension 30 mL  30 mL Oral Daily PRN Austin Cirri F, NP      ? QUEtiapine (SEROQUEL) tablet 400 mg  400 mg Oral QHS Sarina Ill, DO   400 mg at 05/05/21 2119  ? rosuvastatin (CRESTOR) tablet 10 mg  10 mg Oral Daily Sarina Ill, DO   10 mg at 05/07/21 1700  ? traZODone (DESYREL) tablet 100 mg  100 mg Oral QHS PRN Sarina Ill, DO   100 mg at 05/05/21 2119  ? ? ?Lab Results:  ?Results for orders placed or performed during the hospital encounter of 04/29/21 (from the past 48 hour(s))  ?Valproic acid level     Status: None  ? Collection Time: 05/06/21  6:33 AM  ?Result Value Ref Range  ? Valproic Acid Lvl 92 50.0 - 100.0 ug/mL  ?  Comment: Performed at [facility], [address], [place], [place] [address]  ?Lipid panel     Status: None  ? Collection Time: 05/06/21  6:33 AM  ?Result Value Ref Range  ? Cholesterol 103 0 - 200 mg/dL  ? Triglycerides 66 <150 mg/dL  ? HDL 41 >40 mg/dL  ? Total CHOL/HDL Ratio 2.5 RATIO  ? VLDL 13 0 - 40 mg/dL  ? LDL Cholesterol 49 0 - 99 mg/dL  ?  Comment:        ?Total Cholesterol/HDL:CHD Risk ?Coronary Heart Disease Risk Table ?                    Men   Women ? 1/2 Average Risk   3.4   3.3 ? Average Risk       5.0   4.4 ? 2 X Average Risk   9.6   7.1 ? 3 X Average Risk  23.4   11.0 ?       ?Use the calculated Patient Ratio ?above and the CHD Risk Table ?to determine the patient's CHD Risk. ?       ?ATP III CLASSIFICATION (LDL): ? <100     mg/dL   Optimal ? 496-759  mg/dL   Near or Above ?                   Optimal ? 130-159  mg/dL   Borderline ? 160-189  mg/dL   High ? >163     mg/dL   Very High ?Performed at [facility], [address], [place], [place] [address] ?  ?Hemoglobin A1c  Status: Abnormal  ? Collection Time: 05/06/21  6:33 AM  ?Result Value Ref Range  ? Hgb A1c MFr Bld 5.7 (H) 4.8 - 5.6 %  ?  Comment: (NOTE) ?        Prediabetes: 5.7 - 6.4 ?        Diabetes: >6.4 ?        Glycemic  control for adults with diabetes: <7.0 ?  ? Mean Plasma Glucose 117 mg/dL  ?  Comment: (NOTE) ?Performed At: [facility] ?[address] [place], [place] [id number] ?Jolene SchimkeNagendra Sanjai MD WU:[id number]:[phone] ?  ? ? ?Blood Alcohol level:  ?Lab Results  ?Component Value Date  ? ETH <10 04/28/2021  ? ETH <10 03/13/2020  ? ? ?Metabolic Disorder Labs: ?Lab Results  ?Component Value Date  ? HGBA1C 5.7 (H) 05/06/2021  ? MPG 117 05/06/2021  ? MPG 105.41 04/28/2021  ? ?No results found for: PROLACTIN ?Lab Results  ?Component Value Date  ? CHOL 103 05/06/2021  ? TRIG 66 05/06/2021  ? HDL 41 05/06/2021  ? CHOLHDL 2.5 05/06/2021  ? VLDL 13 05/06/2021  ? LDLCALC 49 05/06/2021  ? LDLCALC 40 03/15/2020  ? ? ?Physical Findings: ?AIMS:  , ,  ,  ,    ?CIWA:    ?COWS:    ? ?Musculoskeletal: ?Strength & Muscle Tone: within normal limits ?Gait & Station: unsteady ?Patient leans: N/A ? ?Psychiatric Specialty Exam: ? ?Presentation  ?General Appearance: Disheveled ? ?Eye Contact:Fair ? ?Speech:Slow ? ?Speech Volume:Decreased ? ?Handedness:Right ? ? ?Mood and Affect  ?Mood:Angry; Anxious; Irritable ? ?Affect:Restricted ? ? ?Thought Process  ?Thought Processes:Disorganized ? ?Descriptions of Associations:Loose ? ?Orientation:Partial ? ?Thought Content:Delusions; Illogical; Paranoid Ideation; Perseveration; Scattered ? ?History of Schizophrenia/Schizoaffective disorder:No ? ?Duration of Psychotic Symptoms:Greater than six months ? ?Hallucinations:No data recorded ?Ideas of Reference:Paranoia ? ?Suicidal Thoughts:No data recorded ?Homicidal Thoughts:No data recorded ? ?Sensorium  ?Memory:Immediate Poor; Recent Poor; Remote Poor ? ?Judgment:Impaired ? ?Insight:Poor ? ? ?Executive Functions  ?Concentration:Poor ? ?Attention Span:Poor ? ?Recall:Poor ? ?Fund of Knowledge:Poor ? ?Language:Poor ? ? ?Psychomotor Activity  ?Psychomotor Activity:No data recorded ? ?Assets  ?Assets:Resilience ? ? ?Sleep  ?Sleep:No data recorded ? ? ?Physical  Exam: ?Physical Exam ?Vitals and nursing note reviewed.  ?Constitutional:   ?   Appearance: Normal appearance. He is normal weight.  ?Neurological:  ?   General: No focal deficit present.  ?   Mental Status: He is aler

## 2021-05-07 NOTE — Progress Notes (Signed)
Patient standing at nurses station, and large dark purplish bruise noted to right upper arm from elbow to shoulder. Bruising wraps around front and back of arm. Marland Kitchen  ?

## 2021-05-07 NOTE — Plan of Care (Signed)
Patient alert and oriented times 3. Patient is calm and pleasant. Denies pain at this time. Speech is soft and clear. Denies AVH, SI, and HI. Denies anxiety and depression. Uses walker and wheelchair at times. States he slept good last night. Morning meds given whole by mouth W/O difficulty. Ate breakfast in day room- appetite good. Patient remains on unit with Q15 minute checks in place.  ? ? ?Problem: Education: ?Goal: Knowledge of [facility] Education information/materials will improve ?Outcome: Progressing ?Goal: Mental status will improve ?Outcome: Progressing ?  ?Problem: Health Behavior/Discharge Planning: ?Goal: Identification of resources available to assist in meeting health care needs will improve ?Outcome: Progressing ?Goal: Compliance with treatment plan for underlying cause of condition will improve ?Outcome: Progressing ?  ?Problem: Safety: ?Goal: Periods of time without injury will increase ?Outcome: Progressing ?  ?

## 2021-05-07 NOTE — Evaluation (Signed)
Physical Therapy Evaluation ?Patient Details ?Name: Austin Rhodes ?MRN: [id number] ?DOB: February 02, 1951 ?Today's Date: 05/07/2021 ? ?History of Present Illness ? Pt admitted for manic episodes and bipolar. HIstory includes bioplar, CAD, GERD, and PTSD.  ?Clinical Impression ? Pt is a pleasant 71 year old male who was admitted for manic episodes and bipolar disorder. Pt received in day room sitting in chair. Able to stand without assistance and ambulate in hallway using RW. Pt does demonstrate shuffling gait and needs cues for RW safety. 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 Per RN, no falls since admission. Pt demonstrates deficits with balance. Would recommend to continue using RW at this time for fall prevention. Currently recommend OP PT for balance training once able to be dc'd from hospital. Pt will be dc in house and does not require follow up. RN aware. Will dc current orders. ? ?   ? ?Recommendations for follow up therapy are one component of a multi-disciplinary discharge planning process, led by the attending physician.  Recommendations may be updated based on patient status, additional functional criteria and insurance authorization. ? ?Follow Up Recommendations Outpatient PT ? ?  ?Assistance Recommended at Discharge PRN  ?Patient can return home with the following ? Help with stairs or ramp for entrance ? ?  ?Equipment Recommendations Rolling walker (2 wheels)  ?Recommendations for Other Services ?    ?  ?Functional Status Assessment Patient has had a recent decline in their functional status and demonstrates the ability to make significant improvements in function in a reasonable and predictable amount of time.  ? ?  ?Precautions / Restrictions Precautions ?Precautions: None ?Restrictions ?Weight Bearing Restrictions: No  ? ?  ? ?Mobility ? Bed Mobility ?  ?  ?  ?  ?  ?  ?  ?General bed mobility comments: NT, received in recliner ?   ? ?Transfers ?Overall transfer level: Independent ?Equipment used: None ?  ?  ?  ?  ?  ?  ?  ?General transfer comment: safe technique with ability to stand without B UE support ?  ? ?Ambulation/Gait ?Ambulation/Gait assistance: Supervision ?Gait Distance (Feet): 200 Feet ?Assistive device: Rolling walker (2 wheels) ?Gait Pattern/deviations: Step-through pattern, Shuffle ?  ?  ?  ?General Gait Details: ambulated around hallway with cues for sequencing, keeping RW close and decreased toe drag. Pt tends to shuffle feet and keeps outside of BOS of RW frame during turns. Pt very verbose during ambulation and tangential in speech ? ?Stairs ?  ?  ?  ?  ?  ? ?Wheelchair Mobility ?  ? ?Modified Rankin (Stroke Patients Only) ?  ? ?  ? ?Balance Overall balance assessment: Needs assistance, History of Falls ?Sitting-balance support: Feet supported ?Sitting balance-Leahy Scale: Normal ?  ?  ?Standing balance support: Bilateral upper extremity supported ?Standing balance-Leahy Scale: Fair ?Standing balance comment: pt reports history of falls, but unsure of accurate information ?  ?  ?  ?  ?  ?  ?  ?  ?  ?  ?  ?   ? ? ? ?Pertinent Vitals/Pain Pain Assessment ?Pain Assessment: No/denies pain  ? ? ?Home Living Family/patient expects to be discharged to:: Private residence ?Living Arrangements: Children ?Available Help at Discharge: Family (was living with daughter) ?Type of Home: Apartment ?  ?  ?  ?  ?  ?  ?Additional Comments: pt reports he was living with daughter in apartment, however is now  evicted and is currently homeless  ?  ?Prior Function Prior Level of Function : Independent/Modified Independent ?  ?  ?  ?  ?  ?  ?Mobility Comments: indep prior ?  ?  ? ? ?Hand Dominance  ?   ? ?  ?Extremity/Trunk Assessment  ? Upper Extremity Assessment ?Upper Extremity Assessment: Overall WFL for tasks assessed ?  ? ?Lower Extremity Assessment ?Lower Extremity Assessment: Overall WFL for tasks assessed ?  ? ?   ?Communication  ?  Communication: No difficulties  ?Cognition   ?  ?  ?  ?  ?  ?  ?  ?  ?  ?  ?  ?  ?  ?  ?  ?  ?  ?  ?  ?  ?  ? ?  ?General Comments   ? ?  ?Exercises    ? ?Assessment/Plan  ?  ?PT Assessment All further PT needs can be met in the next venue of care  ?PT Problem List Decreased balance ? ?   ?  ?PT Treatment Interventions     ? ?PT Goals (Current goals can be found in the Care Plan section)  ?Acute Rehab PT Goals ?Patient Stated Goal: to improve balanec ?PT Goal Formulation: All assessment and education complete, DC therapy ?Time For Goal Achievement: 05/07/21 ?Potential to Achieve Goals: Good ? ?  ?Frequency   ?  ? ? ?Co-evaluation   ?  ?  ?  ?  ? ? ?  ?AM-PAC PT "6 Clicks" Mobility  ?Outcome Measure Help needed turning from your back to your side while in a flat bed without using bedrails?: None ?Help needed moving from lying on your back to sitting on the side of a flat bed without using bedrails?: None ?Help needed moving to and from a bed to a chair (including a wheelchair)?: None ?Help needed standing up from a chair using your arms (e.g., wheelchair or bedside chair)?: None ?Help needed to walk in hospital room?: None ?Help needed climbing 3-5 steps with a railing? : A Little ?6 Click Score: 23 ? ?  ?End of Session   ?Activity Tolerance: Patient tolerated treatment well ?Patient left: in chair ?Nurse Communication: Mobility status ?PT Visit Diagnosis: Unsteadiness on feet (R26.81) ?  ? ?Time: 0355-9741 ?PT Time Calculation (min) (ACUTE ONLY): 17 min ? ? ?Charges:   PT Evaluation ?$PT Eval Low Complexity: 1 Low ?PT Treatments ?$Gait Training: 8-22 mins ?  ?   ? ? ?Greggory Stallion, PT, DPT, GCS ?[phone] ? ? ?Alessandria Henken ?05/07/2021, 4:52 PM ? ?

## 2021-05-07 NOTE — BHH Counselor (Signed)
CSW met with two care providers on the unit from [facility] in [place], [place]. They were on the unit to meet with pt regarding potential housing placement. She talked with pt regarding his ADL's and level of independence.  ? ?After, general assessment, she talked with CSW and stated she had  concerns with pt's medication and their ability to care for a pt with high psychiatric needs. She stated that staff Warner Mccreedy, would contact CSW with pending placement updates.   ? ?Austin Rhodes, MSW, LCSW-A ?4/5/20232:27 PM  ?

## 2021-05-08 DIAGNOSIS — F312 Bipolar disorder, current episode manic severe with psychotic features: Secondary | ICD-10-CM | POA: Diagnosis not present

## 2021-05-08 NOTE — Progress Notes (Signed)
Recreation Therapy Notes ? ?Date: 05/08/2021 ? ?Time: 10:00 am  ? ?Location: Day room    ? ?Behavioral response: Appropriate  ? ?Intervention Topic: Animal Assisted Therapy  ? ?Discussion/Intervention:  ?Animal Assisted Therapy took place today during group.  Animal Assisted Therapy is the planned inclusion of an animal in a patient's treatment plan. The patients were able to engage in therapy with an animal during group. Participants were educated on what a service dog is and the different between a support dog and a service dog. Patient were informed on how animal needs are like a person needs. Individuals were enlightened on the process to get a service animal or support animal. Patients got the opportunity to pet the animal and were offered emotional support from the animal and staff.  ?Clinical Observations/Feedback:  ?Patient came to group and was on topic and was focused on what peers and staff had to say. Participant shared their experiences and history with animals. Individual was social with peers, staff and animal while participating in group.  ?Sabrena Gavitt LRT/CTRS  ? ? ? ? ? ? ? ?Kelci Petrella ?05/08/2021 12:50 PM ?

## 2021-05-08 NOTE — BHH Group Notes (Signed)
[facility] Group Notes:  (Nursing/MHT/Case Management/Adjunct) ? ?Date:  05/07/2021 ?Time:  10:00 AM ? ?Type of Therapy:  Psychoeducational Skills ? ?Participation Level:  Active ? ?Participation Quality:  Attentive, Appropiate ? ?Affect:  Appropriate ? ?Cognitive:  Appropriate ? ?Insight:  Appropriate ? ?Engagement in Group:  Engaged ? ?Modes of Intervention:  Discussion ? ?Summary of Progress/Problems: ?The pt attended group and participated in the activity, as well as shared during discussion. ?Juliette Alcide ?05/08/2021, 11:59 AM ?

## 2021-05-08 NOTE — Progress Notes (Signed)
[facility] MD Progress Note ? ?05/08/2021 11:03 AM ?Austin Rhodes  ?MRN:  [id number] ?Subjective: Austin Rhodes is doing much better.  His behavior is much improved.  He is interacting more appropriately with staff and peers.  He is taking his medications and denies any side effects.  He has been compliant.  Apparently, his son is looking for a new apartment for him. ? ?Principal Problem: Bipolar I disorder, current or most recent episode manic, with psychotic features (HCC) ?Diagnosis: Principal Problem: ?  Bipolar I disorder, current or most recent episode manic, with psychotic features (HCC) ?Active Problems: ?  Severe manic bipolar 1 disorder with psychotic behavior (HCC) ?  Bipolar 1 disorder (HCC) ? ?Total Time spent with patient: 15 minutes ? ?Past Psychiatric History: Multiple hospitalizations for bipolar 1 mania. [employer]  ? ?Past Medical History:  ?Past Medical History:  ?Diagnosis Date  ? Bipolar disorder (HCC)   ? Coronary artery disease   ? GERD (gastroesophageal reflux disease)   ? History of multiple strokes   ? PTSD (post-traumatic stress disorder)   ?  ?Past Surgical History:  ?Procedure Laterality Date  ? CARDIAC CATHETERIZATION    ? ?Family History: History reviewed. No pertinent family history. ? ?Social History:  ?Social History  ? ?Substance and Sexual Activity  ?Alcohol Use No  ?   ?Social History  ? ?Substance and Sexual Activity  ?Drug Use Never  ?  ?Social History  ? ?Socioeconomic History  ? Marital status: Divorced  ?  Spouse name: Not on file  ? Number of children: Not on file  ? Years of education: Not on file  ? Highest education level: Not on file  ?Occupational History  ? Not on file  ?Tobacco Use  ? Smoking status: Never  ? Smokeless tobacco: Never  ?Vaping Use  ? Vaping Use: Never used  ?Substance and Sexual Activity  ? Alcohol use: No  ? Drug use: Never  ? Sexual activity: Not on file  ?Other Topics Concern  ? Not on file  ?Social History Narrative  ? Not on file   ? ?Social Determinants of Health  ? ?Financial Resource Strain: Not on file  ?Food Insecurity: Not on file  ?Transportation Needs: Not on file  ?Physical Activity: Not on file  ?Stress: Not on file  ?Social Connections: Not on file  ? ?Additional Social History:  ?  ?  ?  ?  ?  ?  ?  ?  ?  ?  ?  ? ?Sleep: Good ? ?Appetite:  Good ? ?Current Medications: ?Current Facility-Administered Medications  ?Medication Dose Route Frequency Provider Last Rate Last Admin  ? acetaminophen (TYLENOL) tablet 650 mg  650 mg Oral Q6H PRN Sarina Ill, DO   650 mg at 05/01/21 1515  ? albuterol (PROVENTIL) (2.5 MG/3ML) 0.083% nebulizer solution 3 mL  3 mL Inhalation Q4H PRN Sarina Ill, DO      ? alum & mag hydroxide-simeth (MAALOX/MYLANTA) 200-200-20 MG/5ML suspension 30 mL  30 mL Oral Q4H PRN Sarina Ill, DO      ? chlorproMAZINE (THORAZINE) tablet 50 mg  50 mg Oral Q4H PRN Sarina Ill, DO   50 mg at 05/05/21 2119  ? Or  ? chlorproMAZINE (THORAZINE) injection 50 mg  50 mg Intramuscular Q4H PRN Sarina Ill, DO   50 mg at 05/01/21 5631  ? clopidogrel (PLAVIX) tablet 75 mg  75 mg Oral Daily Sarina Ill, DO   75 mg at  05/08/21 1005  ? divalproex (DEPAKOTE ER) 24 hr tablet 500 mg  500 mg Oral TID Sarina Ill, DO   500 mg at 05/08/21 1005  ? lamoTRIgine (LAMICTAL) tablet 50 mg  50 mg Oral BID Sarina Ill, DO   50 mg at 05/08/21 1005  ? LORazepam (ATIVAN) tablet 1 mg  1 mg Oral Q4H PRN Sarina Ill, DO   1 mg at 05/05/21 6160  ? losartan (COZAAR) tablet 50 mg  50 mg Oral Daily Sarina Ill, DO   50 mg at 05/08/21 1005  ? magnesium hydroxide (MILK OF MAGNESIA) suspension 30 mL  30 mL Oral Daily PRN Vanetta Mulders, NP      ? QUEtiapine (SEROQUEL) tablet 400 mg  400 mg Oral QHS Sarina Ill, DO   400 mg at 05/07/21 2122  ? rosuvastatin (CRESTOR) tablet 10 mg  10 mg Oral Daily Sarina Ill, DO   10 mg at  05/08/21 1005  ? traZODone (DESYREL) tablet 100 mg  100 mg Oral QHS PRN Sarina Ill, DO   100 mg at 05/07/21 2124  ? ? ?Lab Results: No results found for this or any previous visit (from the past 48 hour(s)). ? ?Blood Alcohol level:  ?Lab Results  ?Component Value Date  ? ETH <10 04/28/2021  ? ETH <10 03/13/2020  ? ? ?Metabolic Disorder Labs: ?Lab Results  ?Component Value Date  ? HGBA1C 5.7 (H) 05/06/2021  ? MPG 117 05/06/2021  ? MPG 105.41 04/28/2021  ? ?No results found for: PROLACTIN ?Lab Results  ?Component Value Date  ? CHOL 103 05/06/2021  ? TRIG 66 05/06/2021  ? HDL 41 05/06/2021  ? CHOLHDL 2.5 05/06/2021  ? VLDL 13 05/06/2021  ? LDLCALC 49 05/06/2021  ? LDLCALC 40 03/15/2020  ? ? ?Physical Findings: ?AIMS:  , ,  ,  ,    ?CIWA:    ?COWS:    ? ?Musculoskeletal: ?Strength & Muscle Tone: within normal limits ?Gait & Station: normal ?Patient leans: N/A ? ?Psychiatric Specialty Exam: ? ?Presentation  ?General Appearance: Disheveled ? ?Eye Contact:Fair ? ?Speech:Slow ? ?Speech Volume:Decreased ? ?Handedness:Right ? ? ?Mood and Affect  ?Mood:Angry; Anxious; Irritable ? ?Affect:Restricted ? ? ?Thought Process  ?Thought Processes:Disorganized ? ?Descriptions of Associations:Loose ? ?Orientation:Partial ? ?Thought Content:Delusions; Illogical; Paranoid Ideation; Perseveration; Scattered ? ?History of Schizophrenia/Schizoaffective disorder:No ? ?Duration of Psychotic Symptoms:Greater than six months ? ?Hallucinations:No data recorded ?Ideas of Reference:Paranoia ? ?Suicidal Thoughts:No data recorded ?Homicidal Thoughts:No data recorded ? ?Sensorium  ?Memory:Immediate Poor; Recent Poor; Remote Poor ? ?Judgment:Impaired ? ?Insight:Poor ? ? ?Executive Functions  ?Concentration:Poor ? ?Attention Span:Poor ? ?Recall:Poor ? ?Fund of Knowledge:Poor ? ?Language:Poor ? ? ?Psychomotor Activity  ?Psychomotor Activity:No data recorded ? ?Assets  ?Assets:Resilience ? ? ?Sleep  ?Sleep:No data recorded ? ? ?Physical  Exam: ?Physical Exam ?Vitals and nursing note reviewed.  ?Constitutional:   ?   Appearance: Normal appearance. He is normal weight.  ?Neurological:  ?   General: No focal deficit present.  ?   Mental Status: He is alert and oriented to person, place, and time.  ?Psychiatric:     ?   Attention and Perception: Attention and perception normal.     ?   Mood and Affect: Mood and affect normal.     ?   Speech: Speech is tangential.     ?   Behavior: Behavior normal. Behavior is cooperative.     ?   Thought Content: Thought content is delusional.     ?  Cognition and Memory: Cognition and memory normal.     ?   Judgment: Judgment is inappropriate.  ? ?Review of Systems  ?Constitutional: Negative.   ?HENT: Negative.    ?Eyes: Negative.   ?Respiratory: Negative.    ?Cardiovascular: Negative.   ?Gastrointestinal: Negative.   ?Genitourinary: Negative.   ?Musculoskeletal: Negative.   ?Skin: Negative.   ?Neurological: Negative.   ?Endo/Heme/Allergies: Negative.   ?Psychiatric/Behavioral: Negative.    ?Blood pressure 119/88, pulse 98, temperature 99.3 ?F (37.4 ?C), resp. rate 16, height 5\' 8"  (1.727 m), weight 76.2 kg, SpO2 96 %. Body mass index is 25.54 kg/m?. ? ? ?Treatment Plan Summary: ?Daily contact with patient to assess and evaluate symptoms and progress in treatment, Medication management, and Plan continue current medications.  PT is working with him. ? ?Sarina Illichard Edward Ediberto Sens, DO ?05/08/2021, 11:03 AM ? ?

## 2021-05-08 NOTE — Plan of Care (Signed)
Patient Calm and Cooperative on unit during shift.  Patient denies SI/HI/Avh and contracts for safety.  Patient denies Depression and Anxiety.  Patient educated on scheduled medications.  Patient did comply with scheduled medications. Patient did participate in therapeutic milieu and night snack. Q 15 minute safety checks inplace.  Will continue plan of care. ? ? ?Problem: Education: ?Goal: Knowledge of [place] [facility] Education information/materials will improve ?Outcome: Progressing ?Goal: Mental status will improve ?Outcome: Progressing ?  ?Problem: Health Behavior/Discharge Planning: ?Goal: Identification of resources available to assist in meeting health care needs will improve ?Outcome: Progressing ?Goal: Compliance with treatment plan for underlying cause of condition will improve ?Outcome: Progressing ?  ?Problem: Safety: ?Goal: Periods of time without injury will increase ?Outcome: Progressing ?  ?Problem: Health Behavior/Discharge Planning: ?Goal: Ability to manage health-related needs will improve ?Outcome: Progressing ?  ?

## 2021-05-08 NOTE — Plan of Care (Signed)
?  Problem: Education: ?Goal: Knowledge of [place] [facility] Education information/materials will improve ?Outcome: Progressing ?Goal: Mental status will improve ?Outcome: Progressing ?  ?Problem: Health Behavior/Discharge Planning: ?Goal: Identification of resources available to assist in meeting health care needs will improve ?Outcome: Progressing ?Goal: Compliance with treatment plan for underlying cause of condition will improve ?Outcome: Progressing ?  ?Problem: Safety: ?Goal: Periods of time without injury will increase ?Outcome: Progressing ?  ?Problem: Education: ?Goal: Knowledge of General Education information will improve ?Description: Including pain rating scale, medication(s)/side effects and non-pharmacologic comfort measures ?Outcome: Progressing ?  ?Problem: Health Behavior/Discharge Planning: ?Goal: Ability to manage health-related needs will improve ?Outcome: Progressing ?  ?Problem: Clinical Measurements: ?Goal: Ability to maintain clinical measurements within normal limits will improve ?Outcome: Progressing ?Goal: Will remain free from infection ?Outcome: Progressing ?Goal: Diagnostic test results will improve ?Outcome: Progressing ?Goal: Respiratory complications will improve ?Outcome: Progressing ?Goal: Cardiovascular complication will be avoided ?Outcome: Progressing ?  ?Problem: Activity: ?Goal: Risk for activity intolerance will decrease ?Outcome: Progressing ?  ?Problem: Nutrition: ?Goal: Adequate nutrition will be maintained ?Outcome: Progressing ?  ?Problem: Coping: ?Goal: Level of anxiety will decrease ?Outcome: Progressing ?  ?Problem: Elimination: ?Goal: Will not experience complications related to bowel motility ?Outcome: Progressing ?Goal: Will not experience complications related to urinary retention ?Outcome: Progressing ?  ?Problem: Pain Managment: ?Goal: General experience of comfort will improve ?Outcome: Progressing ?  ?Problem: Safety: ?Goal: Ability to remain free from  injury will improve ?Outcome: Progressing ?  ?Problem: Skin Integrity: ?Goal: Risk for impaired skin integrity will decrease ?Outcome: Progressing ?  ?

## 2021-05-08 NOTE — Progress Notes (Signed)
Patient remain alert and oriented. Calm and compliant with assessment. Denies SI, HI, and AVH. Denies depression and anxiety. Denies pain or discomfort at this time. Ate meals in the day room among peers and tolerated well. Compliant with all due medications. Remain safe on the unit with Q15 minute safety checks. ?

## 2021-05-08 NOTE — Group Note (Signed)
[place] LCSW Group Therapy Note ? ? ?Group Date: 05/08/2021 ?Start Time: 1415 ?End Time: [id number] ? ? ?Type of Therapy/Topic:  Group Therapy:  Emotion Regulation ? ?Participation Level:  Minimal  ? ?Mood: ? ?Description of Group:   ? The purpose of this group is to assist patients in learning to regulate negative emotions and experience positive emotions. Patients will be guided to discuss ways in which they have been vulnerable to their negative emotions. These vulnerabilities will be juxtaposed with experiences of positive emotions or situations, and patients challenged to use positive emotions to combat negative ones. Special emphasis will be placed on coping with negative emotions in conflict situations, and patients will process healthy conflict resolution skills. ? ?Therapeutic Goals: ?Patient will identify two positive emotions or experiences to reflect on in order to balance out negative emotions:  ?Patient will label two or more emotions that they find the most difficult to experience:  ?Patient will be able to demonstrate positive conflict resolution skills through discussion or role plays:  ? ?Summary of Patient Progress: ? ? ?Patient was present for the entirety of the group session. Patient was an active listener and participated in the topic of discussion, provided helpful advice to others, and added nuance to topic of conversation. CSW led patients through emotional regulation activity  to facilitate discussion and illicit thoughts and feelings regarding topic. 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 He stated that he feels like he belongs when he "is around like minded people" and continued to say that is when "he is happiest." ? ? ? ? ?Therapeutic Modalities:   ?Cognitive Behavioral Therapy ?Feelings Identification ?Dialectical Behavioral Therapy ? ? ?Valmai Vandenberghe A [place], LCSWA ?

## 2021-05-09 DIAGNOSIS — F312 Bipolar disorder, current episode manic severe with psychotic features: Secondary | ICD-10-CM | POA: Diagnosis not present

## 2021-05-09 NOTE — Progress Notes (Signed)
[facility] MD Progress Note ? ?05/09/2021 11:54 AM ?Austin Rhodes  ?MRN:  [id number] ?Subjective: Austin Rhodes is compliant with his medications.  He denies any side effects.  He is much improved.  He still has delusions of grandeur.  His behavior is improving.  His sleep is improved.  He denies any depression. ? ?Principal Problem: Bipolar I disorder, current or most recent episode manic, with psychotic features ([place]) ?Diagnosis: Principal Problem: ?  Bipolar I disorder, current or most recent episode manic, with psychotic features ([place]) ?Active Problems: ?  Severe manic bipolar 1 disorder with psychotic behavior ([place]) ?  Bipolar 1 disorder ([place]) ? ?Total Time spent with patient: 15 minutes ? ?Past Psychiatric History: Bipolar I Disorder ? ?Past Medical History:  ?Past Medical History:  ?Diagnosis Date  ? Bipolar disorder ([place])   ? Coronary artery disease   ? GERD (gastroesophageal reflux disease)   ? History of multiple strokes   ? PTSD (post-traumatic stress disorder)   ?  ?Past Surgical History:  ?Procedure Laterality Date  ? CARDIAC CATHETERIZATION    ? ?Family History: History reviewed. No pertinent family history. ? ?Social History:  ?Social History  ? ?Substance and Sexual Activity  ?Alcohol Use No  ?   ?Social History  ? ?Substance and Sexual Activity  ?Drug Use Never  ?  ?Social History  ? ?Socioeconomic History  ? Marital status: Divorced  ?  Spouse name: Not on file  ? Number of children: Not on file  ? Years of education: Not on file  ? Highest education level: Not on file  ?Occupational History  ? Not on file  ?Tobacco Use  ? Smoking status: Never  ? Smokeless tobacco: Never  ?Vaping Use  ? Vaping Use: Never used  ?Substance and Sexual Activity  ? Alcohol use: No  ? Drug use: Never  ? Sexual activity: Not on file  ?Other Topics Concern  ? Not on file  ?Social History Narrative  ? Not on file  ? ?Social Determinants of Health  ? ?Financial Resource Strain: Not on file  ?Food Insecurity: Not on file   ?Transportation Needs: Not on file  ?Physical Activity: Not on file  ?Stress: Not on file  ?Social Connections: Not on file  ? ?Additional Social History:  ?  ?  ?  ?  ?  ?  ?  ?  ?  ?  ?  ? ?Sleep: Good ? ?Appetite:  Good ? ?Current Medications: ?Current Facility-Administered Medications  ?Medication Dose Route Frequency Provider Last Rate Last Admin  ? acetaminophen (TYLENOL) tablet 650 mg  650 mg Oral Q6H PRN Parks Ranger, DO   650 mg at 05/01/21 1515  ? albuterol (PROVENTIL) (2.5 MG/3ML) 0.083% nebulizer solution 3 mL  3 mL Inhalation Q4H PRN Parks Ranger, DO      ? alum & mag hydroxide-simeth (MAALOX/MYLANTA) 200-200-20 MG/5ML suspension 30 mL  30 mL Oral Q4H PRN Parks Ranger, DO      ? chlorproMAZINE (THORAZINE) tablet 50 mg  50 mg Oral Q4H PRN Parks Ranger, DO   50 mg at 05/05/21 2119  ? Or  ? chlorproMAZINE (THORAZINE) injection 50 mg  50 mg Intramuscular Q4H PRN Parks Ranger, DO   50 mg at 05/01/21 [id number]  ? clopidogrel (PLAVIX) tablet 75 mg  75 mg Oral Daily Parks Ranger, DO   75 mg at 05/09/21 1052  ? divalproex (DEPAKOTE ER) 24 hr tablet 500 mg  500 mg Oral TID  Parks Ranger, DO   500 mg at 05/09/21 1052  ? lamoTRIgine (LAMICTAL) tablet 50 mg  50 mg Oral BID Parks Ranger, DO   50 mg at 05/09/21 1052  ? LORazepam (ATIVAN) tablet 1 mg  1 mg Oral Q4H PRN Parks Ranger, DO   1 mg at 05/05/21 [id number]  ? losartan (COZAAR) tablet 50 mg  50 mg Oral Daily Parks Ranger, DO   50 mg at 05/09/21 1052  ? magnesium hydroxide (MILK OF MAGNESIA) suspension 30 mL  30 mL Oral Daily PRN Sherlon Handing, NP      ? QUEtiapine (SEROQUEL) tablet 400 mg  400 mg Oral QHS Parks Ranger, DO   400 mg at 05/08/21 2210  ? rosuvastatin (CRESTOR) tablet 10 mg  10 mg Oral Daily Parks Ranger, DO   10 mg at 05/09/21 1052  ? traZODone (DESYREL) tablet 100 mg  100 mg Oral QHS PRN Parks Ranger, DO   100 mg  at 05/08/21 2210  ? ? ?Lab Results: No results found for this or any previous visit (from the past 48 hour(s)). ? ?Blood Alcohol level:  ?Lab Results  ?Component Value Date  ? ETH <10 04/28/2021  ? ETH <10 03/13/2020  ? ? ?Metabolic Disorder Labs: ?Lab Results  ?Component Value Date  ? HGBA1C 5.7 (H) 05/06/2021  ? MPG 117 05/06/2021  ? MPG 105.41 04/28/2021  ? ?No results found for: PROLACTIN ?Lab Results  ?Component Value Date  ? CHOL 103 05/06/2021  ? TRIG 66 05/06/2021  ? HDL 41 05/06/2021  ? CHOLHDL 2.5 05/06/2021  ? VLDL 13 05/06/2021  ? LDLCALC 49 05/06/2021  ? [place] 40 03/15/2020  ? ? ?Physical Findings: ?AIMS:  , ,  ,  ,    ?CIWA:    ?COWS:    ? ?Musculoskeletal: ?Strength & Muscle Tone: within normal limits ?Gait & Station: normal ?Patient leans: N/A ? ?Psychiatric Specialty Exam: ? ?Presentation  ?General Appearance: Disheveled ? ?Eye Contact:Fair ? ?Speech:Slow ? ?Speech Volume:Decreased ? ?Handedness:Right ? ? ?Mood and Affect  ?Mood:Angry; Anxious; Irritable ? ?Affect:Restricted ? ? ?Thought Process  ?Thought Processes:Disorganized ? ?Descriptions of Associations:Loose ? ?Orientation:Partial ? ?Thought Content:Delusions; Illogical; Paranoid Ideation; Perseveration; Scattered ? ?History of Schizophrenia/Schizoaffective disorder:No ? ?Duration of Psychotic Symptoms:Greater than six months ? ?Hallucinations:No data recorded ?Ideas of Reference:Paranoia ? ?Suicidal Thoughts:No data recorded ?Homicidal Thoughts:No data recorded ? ?Sensorium  ?Memory:Immediate Poor; Recent Poor; Remote Poor ? ?Judgment:Impaired ? ?Insight:Poor ? ? ?Executive Functions  ?Concentration:Poor ? ?Attention Span:Poor ? ?Recall:Poor ? ?Fund of Knowledge:Poor ? ?Language:Poor ? ? ?Psychomotor Activity  ?Psychomotor Activity:No data recorded ? ?Assets  ?Assets:Resilience ? ? ?Sleep  ?Sleep:No data recorded ? ? ?Physical Exam: ?Physical Exam ?Vitals and nursing note reviewed.  ?Constitutional:   ?   Appearance: Normal appearance. He  is normal weight.  ?Neurological:  ?   General: No focal deficit present.  ?   Mental Status: He is alert and oriented to person, place, and time.  ?Psychiatric:     ?   Attention and Perception: Attention and perception normal.     ?   Mood and Affect: Mood and affect normal.     ?   Speech: Speech is tangential.     ?   Behavior: Behavior normal. Behavior is cooperative.     ?   Thought Content: Thought content is delusional.     ?   Cognition and Memory: Cognition and memory normal.     ?  Judgment: Judgment is inappropriate.  ? ?Review of Systems  ?Constitutional: Negative.   ?HENT: Negative.    ?Eyes: Negative.   ?Respiratory: Negative.    ?Cardiovascular: Negative.   ?Gastrointestinal: Negative.   ?Genitourinary: Negative.   ?Musculoskeletal: Negative.   ?Skin: Negative.   ?Neurological: Negative.   ?Endo/Heme/Allergies: Negative.   ?Psychiatric/Behavioral: Negative.    ?Blood pressure (!) 148/75, pulse 100, temperature 98.2 ?F (36.8 ?C), temperature source Oral, resp. rate 16, height 5\' 8"  (1.727 m), weight 76.2 kg, SpO2 98 %. Body mass index is 25.54 kg/m?. ? ? ?Treatment Plan Summary: ?Daily contact with patient to assess and evaluate symptoms and progress in treatment, Medication management, and Plan continue current medications.  Social work and his son are working on living situation. ? ?Parks Ranger, DO ?05/09/2021, 11:54 AM ? ?

## 2021-05-09 NOTE — Plan of Care (Signed)
Patient did present irritable and anxious during shift.  Patient continues to speak in tangential statements. Patient states "I don't want to take a lot of medication".  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 Patient educated on importance of compliance with treatment plan.  Patient did eventually comply with scheduled medications. Participates in therapeutic milieu.  Patient ate night snack and nursing staff encouraged fluids.  Q 15 minute safety checks in place.  Will continue plan of care.  ? ?Problem: Education: ?Goal: Knowledge of  General Education information/materials will improve ?Outcome: Progressing ?Goal: Mental status will improve ?Outcome: Progressing ?  ?Problem: Health Behavior/Discharge Planning: ?Goal: Compliance with treatment plan for underlying cause of condition will improve ?Outcome: Progressing ?  ?

## 2021-05-09 NOTE — Plan of Care (Signed)
Patient remain alert and oriented, calm and cooperative during assessment. Patient denies SI, HI, AVH. Denies anxiety and depression. Ate meals in the day room among peers with good appetite. Compliant with all due medications.  Ate meals in the day room with good appetite. Patient is currently in the day room among staff and peers in no apparent distress at this time. Q15 minute safety check in place. ? ?Problem: Education: ?Goal: Knowledge of [place] [facility] Education information/materials will improve ?Outcome: Progressing ?Goal: Mental status will improve ?Outcome: Progressing ?  ?Problem: Health Behavior/Discharge Planning: ?Goal: Identification of resources available to assist in meeting health care needs will improve ?Outcome: Progressing ?Goal: Compliance with treatment plan for underlying cause of condition will improve ?Outcome: Progressing ?  ?Problem: Safety: ?Goal: Periods of time without injury will increase ?Outcome: Progressing ?  ?Problem: Education: ?Goal: Knowledge of General Education information will improve ?Description: Including pain rating scale, medication(s)/side effects and non-pharmacologic comfort measures ?Outcome: Progressing ?  ?Problem: Health Behavior/Discharge Planning: ?Goal: Ability to manage health-related needs will improve ?Outcome: Progressing ?  ?Problem: Clinical Measurements: ?Goal: Ability to maintain clinical measurements within normal limits will improve ?Outcome: Progressing ?Goal: Will remain free from infection ?Outcome: Progressing ?Goal: Diagnostic test results will improve ?Outcome: Progressing ?Goal: Respiratory complications will improve ?Outcome: Progressing ?Goal: Cardiovascular complication will be avoided ?Outcome: Progressing ?  ?Problem: Activity: ?Goal: Risk for activity intolerance will decrease ?Outcome: Progressing ?  ?Problem: Nutrition: ?Goal: Adequate nutrition will be maintained ?Outcome: Progressing ?  ?Problem: Coping: ?Goal: Level of  anxiety will decrease ?Outcome: Progressing ?  ?Problem: Elimination: ?Goal: Will not experience complications related to bowel motility ?Outcome: Progressing ?Goal: Will not experience complications related to urinary retention ?Outcome: Progressing ?  ?Problem: Pain Managment: ?Goal: General experience of comfort will improve ?Outcome: Progressing ?  ?Problem: Safety: ?Goal: Ability to remain free from injury will improve ?Outcome: Progressing ?  ?Problem: Skin Integrity: ?Goal: Risk for impaired skin integrity will decrease ?Outcome: Progressing ?  ?

## 2021-05-09 NOTE — Progress Notes (Signed)
Recreation Therapy Notes ? ?Date: 05/09/2021 ? ?Time: 1:25 PM   ? ?Location: Day room   ? ?Behavioral response: N/A ?  ?Intervention Topic: Social Skills   ? ?Discussion/Intervention: ?Patient refused to attend group.  ? ?Clinical Observations/Feedback:  ?Patient refused to attend group.  ?  ?Austin Rhodes LRT/CTRS ? ? ? ? ? ? ? ?Austin Rhodes ?05/09/2021 3:32 PM ?

## 2021-05-09 NOTE — Plan of Care (Signed)
Patient Calm and Cooperative during shift.  Patient presents Euthymic but displays Tangential speech during assessment. "I really appreciate you guys are the best" and ongoing.  Patient denies SI/HI/AVH and Depression and Anxiety.  Patient did comply with scheduled medications.  Participates in therapeutic milieu and interacts with peers.  Patient did have night snack.  Q 15 minute safety rounding in place.  Informed patient to notify nursing staff if needs arise.  Plan of care will continue.   ? ?Problem: Education: ?Goal: Knowledge of [place] [facility] Education information/materials will improve ?Outcome: Progressing ?Goal: Mental status will improve ?Outcome: Progressing ?  ?Problem: Health Behavior/Discharge Planning: ?Goal: Compliance with treatment plan for underlying cause of condition will improve ?Outcome: Progressing ?  ?Problem: Safety: ?Goal: Periods of time without injury will increase ?Outcome: Progressing ?  ?

## 2021-05-10 DIAGNOSIS — F312 Bipolar disorder, current episode manic severe with psychotic features: Secondary | ICD-10-CM | POA: Diagnosis not present

## 2021-05-10 NOTE — Progress Notes (Signed)
[facility] MD Progress Note ? ?05/10/2021 10:32 AM ?Austin Rhodes  ?MRN:  [id number] ?Subjective: Austin Rhodes continues to be delusional but his behavior is much improved.  He still has his moments of irritability as discussed below by the nurses notes. ? ?Patient did present irritable and anxious during shift.  Patient continues to speak in tangential statements. Patient states "I don't want to take a lot of medication".  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 Patient educated on importance of compliance with treatment plan.  Patient did eventually comply with scheduled medications. Participates in therapeutic milieu.  Patient ate night snack and nursing staff encouraged fluids.  Q 15 minute safety checks in place.  ? ?Principal Problem: Bipolar I disorder, current or most recent episode manic, with psychotic features (HCC) ?Diagnosis: Principal Problem: ?  Bipolar I disorder, current or most recent episode manic, with psychotic features (HCC) ?Active Problems: ?  Severe manic bipolar 1 disorder with psychotic behavior (HCC) ?  Bipolar 1 disorder (HCC) ? ?Total Time spent with patient: 15 minutes ? ?Past Psychiatric History: Bipolar 1 disorder manic ? ?Past Medical History:  ?Past Medical History:  ?Diagnosis Date  ? Bipolar disorder (HCC)   ? Coronary artery disease   ? GERD (gastroesophageal reflux disease)   ? History of multiple strokes   ? PTSD (post-traumatic stress disorder)   ?  ?Past Surgical History:  ?Procedure Laterality Date  ? CARDIAC CATHETERIZATION    ? ?Family History: History reviewed. No pertinent family history. ? ?Social History:  ?Social History  ? ?Substance and Sexual Activity  ?Alcohol Use No  ?   ?Social History  ? ?Substance and Sexual Activity  ?Drug Use Never  ?  ?Social History  ? ?Socioeconomic History  ? Marital status: Divorced  ?  Spouse name: Not on file  ? Number of children: Not on file  ? Years of education: Not on file  ?  Highest education level: Not on file  ?Occupational History  ? Not on file  ?Tobacco Use  ? Smoking status: Never  ? Smokeless tobacco: Never  ?Vaping Use  ? Vaping Use: Never used  ?Substance and Sexual Activity  ? Alcohol use: No  ? Drug use: Never  ? Sexual activity: Not on file  ?Other Topics Concern  ? Not on file  ?Social History Narrative  ? Not on file  ? ?Social Determinants of Health  ? ?Financial Resource Strain: Not on file  ?Food Insecurity: Not on file  ?Transportation Needs: Not on file  ?Physical Activity: Not on file  ?Stress: Not on file  ?Social Connections: Not on file  ? ?Additional Social History:  ?  ?  ?  ?  ?  ?  ?  ?  ?  ?  ?  ? ?Sleep: Good ? ?Appetite:  Good ? ?Current Medications: ?Current Facility-Administered Medications  ?Medication Dose Route Frequency Provider Last Rate Last Admin  ? acetaminophen (TYLENOL) tablet 650 mg  650 mg Oral Q6H PRN Sarina Ill, DO   650 mg at 05/01/21 1515  ? albuterol (PROVENTIL) (2.5 MG/3ML) 0.083% nebulizer solution 3 mL  3 mL Inhalation Q4H PRN Sarina Ill, DO      ? alum & mag hydroxide-simeth (MAALOX/MYLANTA) 200-200-20 MG/5ML suspension 30 mL  30 mL Oral Q4H PRN Sarina Ill, DO      ? chlorproMAZINE (THORAZINE) tablet 50 mg  50 mg Oral Q4H PRN  Sarina IllHerrick, Leland Staszewski Edward, DO   50 mg at 05/05/21 2119  ? Or  ? chlorproMAZINE (THORAZINE) injection 50 mg  50 mg Intramuscular Q4H PRN Sarina IllHerrick, Rolene Andrades Edward, DO   50 mg at 05/01/21 [id number]  ? clopidogrel (PLAVIX) tablet 75 mg  75 mg Oral Daily Sarina IllHerrick, Shlomo Seres Edward, DO   75 mg at 05/10/21 1010  ? divalproex (DEPAKOTE ER) 24 hr tablet 500 mg  500 mg Oral TID Sarina IllHerrick, Loula Marcella Edward, DO   500 mg at 05/10/21 1010  ? lamoTRIgine (LAMICTAL) tablet 50 mg  50 mg Oral BID Sarina IllHerrick, Diquan Kassis Edward, DO   50 mg at 05/10/21 1010  ? LORazepam (ATIVAN) tablet 1 mg  1 mg Oral Q4H PRN Sarina IllHerrick, Vergie Zahm Edward, DO   1 mg at 05/05/21 [id number]  ? losartan (COZAAR) tablet 50 mg  50 mg Oral Daily  Sarina IllHerrick, Jill Ruppe Edward, DO   50 mg at 05/10/21 1011  ? magnesium hydroxide (MILK OF MAGNESIA) suspension 30 mL  30 mL Oral Daily PRN Vanetta MuldersBarthold, Louise F, NP      ? QUEtiapine (SEROQUEL) tablet 400 mg  400 mg Oral QHS Sarina IllHerrick, Casy Brunetto Edward, DO   400 mg at 05/09/21 2223  ? rosuvastatin (CRESTOR) tablet 10 mg  10 mg Oral Daily Sarina IllHerrick, Amin Fornwalt Edward, DO   10 mg at 05/10/21 1010  ? traZODone (DESYREL) tablet 100 mg  100 mg Oral QHS PRN Sarina IllHerrick, Vidyuth Belsito Edward, DO   100 mg at 05/09/21 2223  ? ? ?Lab Results: No results found for this or any previous visit (from the past 48 hour(s)). ? ?Blood Alcohol level:  ?Lab Results  ?Component Value Date  ? ETH <10 04/28/2021  ? ETH <10 03/13/2020  ? ? ?Metabolic Disorder Labs: ?Lab Results  ?Component Value Date  ? HGBA1C 5.7 (H) 05/06/2021  ? MPG 117 05/06/2021  ? MPG 105.41 04/28/2021  ? ?No results found for: PROLACTIN ?Lab Results  ?Component Value Date  ? CHOL 103 05/06/2021  ? TRIG 66 05/06/2021  ? HDL 41 05/06/2021  ? CHOLHDL 2.5 05/06/2021  ? VLDL 13 05/06/2021  ? LDLCALC 49 05/06/2021  ? LDLCALC 40 03/15/2020  ? ? ?Physical Findings: ?AIMS:  , ,  ,  ,    ?CIWA:    ?COWS:    ? ?Musculoskeletal: ?Strength & Muscle Tone: within normal limits ?Gait & Station: normal ?Patient leans: N/A ? ?Psychiatric Specialty Exam: ? ?Presentation  ?General Appearance: Disheveled ? ?Eye Contact:Fair ? ?Speech:Slow ? ?Speech Volume:Decreased ? ?Handedness:Right ? ? ?Mood and Affect  ?Mood:Angry; Anxious; Irritable ? ?Affect:Restricted ? ? ?Thought Process  ?Thought Processes:Disorganized ? ?Descriptions of Associations:Loose ? ?Orientation:Partial ? ?Thought Content:Delusions; Illogical; Paranoid Ideation; Perseveration; Scattered ? ?History of Schizophrenia/Schizoaffective disorder:No ? ?Duration of Psychotic Symptoms:Greater than six months ? ?Hallucinations:No data recorded ?Ideas of Reference:Paranoia ? ?Suicidal Thoughts:No data recorded ?Homicidal Thoughts:No data  recorded ? ?Sensorium  ?Memory:Immediate Poor; Recent Poor; Remote Poor ? ?Judgment:Impaired ? ?Insight:Poor ? ? ?Executive Functions  ?Concentration:Poor ? ?Attention Span:Poor ? ?Recall:Poor ? ?Fund of Knowledge:Poor ? ?Language:Poor ? ? ?Psychomotor Activity  ?Psychomotor Activity:No data recorded ? ?Assets  ?Assets:Resilience ? ? ?Sleep  ?Sleep:No data recorded ? ? ?Physical Exam: ?Physical Exam ?Vitals and nursing note reviewed.  ?Constitutional:   ?   Appearance: Normal appearance. He is normal weight.  ?Neurological:  ?   General: No focal deficit present.  ?   Mental Status: He is alert and oriented to person, place, and time.  ?Psychiatric:     ?  Mood and Affect: Mood normal.     ?   Behavior: Behavior normal.  ? ?Review of Systems  ?Constitutional: Negative.   ?HENT: Negative.    ?Eyes: Negative.   ?Respiratory: Negative.    ?Cardiovascular: Negative.   ?Gastrointestinal: Negative.   ?Genitourinary: Negative.   ?Musculoskeletal: Negative.   ?Skin: Negative.   ?Neurological: Negative.   ?Endo/Heme/Allergies: Negative.   ?Psychiatric/Behavioral: Negative.    ?Blood pressure 119/75, pulse 77, temperature 97.9 ?F (36.6 ?C), resp. rate 20, height 5\' 8"  (1.727 m), weight 76.2 kg, SpO2 100 %. Body mass index is 25.54 kg/m?. ? ? ?Treatment Plan Summary: ?Daily contact with patient to assess and evaluate symptoms and progress in treatment, Medication management, and Plan continue current medications. ? ? , DO ?05/10/2021, 10:32 AM ? ?

## 2021-05-10 NOTE — Group Note (Signed)
LCSW Group Therapy Note ? ?Group Date: 05/10/2021 ?Start Time: 1300 ?End Time: 1400 ? ? ?Type of Therapy and Topic:  Group Therapy - Healthy vs Unhealthy Coping Skills ? ?Participation Level:  Did Not Attend  ? ?Description of Group ?The focus of this group was to determine what unhealthy coping techniques typically are used by group members and what healthy coping techniques would be helpful in coping with various problems. Patients were guided in becoming aware of the differences between healthy and unhealthy coping techniques. Patients were asked to identify 2-3 healthy coping skills they would like to learn to use more effectively. ? ?Therapeutic Goals ?Patients learned that coping is what human beings do all day long to deal with various situations in their lives ?Patients defined and discussed healthy vs unhealthy coping techniques ?Patients identified their preferred coping techniques and identified whether these were healthy or unhealthy ?Patients determined 2-3 healthy coping skills they would like to become more familiar with and use more often. ?Patients provided support and ideas to each other ? ? ?Summary of Patient Progress: Due to limited staffing, group was not held on the unit.  ? ? ?Therapeutic Modalities ?Cognitive Behavioral Therapy ?Motivational Interviewing ? ?Shinita Mac K Uziah Sorter, LCSWA ?05/10/2021  4:00 PM   ?

## 2021-05-10 NOTE — BH IP Treatment Plan (Signed)
Interdisciplinary Treatment and Diagnostic Plan Update ? ?05/10/2021 ?Time of Session: 10:30AM ?Austin Rhodes ?MRN: [id number] ? ?Principal Diagnosis: Bipolar I disorder, current or most recent episode manic, with psychotic features ([place]) ? ?Secondary Diagnoses: Principal Problem: ?  Bipolar I disorder, current or most recent episode manic, with psychotic features ([place]) ?Active Problems: ?  Severe manic bipolar 1 disorder with psychotic behavior ([place]) ?  Bipolar 1 disorder ([place]) ? ? ?Current Medications:  ?Current Facility-Administered Medications  ?Medication Dose Route Frequency Provider Last Rate Last Admin  ? acetaminophen (TYLENOL) tablet 650 mg  650 mg Oral Q6H PRN Parks Ranger, DO   650 mg at 05/01/21 1515  ? albuterol (PROVENTIL) (2.5 MG/3ML) 0.083% nebulizer solution 3 mL  3 mL Inhalation Q4H PRN Parks Ranger, DO      ? alum & mag hydroxide-simeth (MAALOX/MYLANTA) 200-200-20 MG/5ML suspension 30 mL  30 mL Oral Q4H PRN Parks Ranger, DO      ? chlorproMAZINE (THORAZINE) tablet 50 mg  50 mg Oral Q4H PRN Parks Ranger, DO   50 mg at 05/05/21 2119  ? Or  ? chlorproMAZINE (THORAZINE) injection 50 mg  50 mg Intramuscular Q4H PRN Parks Ranger, DO   50 mg at 05/01/21 0814  ? clopidogrel (PLAVIX) tablet 75 mg  75 mg Oral Daily Parks Ranger, DO   75 mg at 05/10/21 1010  ? divalproex (DEPAKOTE ER) 24 hr tablet 500 mg  500 mg Oral TID Parks Ranger, DO   500 mg at 05/10/21 1010  ? lamoTRIgine (LAMICTAL) tablet 50 mg  50 mg Oral BID Parks Ranger, DO   50 mg at 05/10/21 1010  ? LORazepam (ATIVAN) tablet 1 mg  1 mg Oral Q4H PRN Parks Ranger, DO   1 mg at 05/05/21 4818  ? losartan (COZAAR) tablet 50 mg  50 mg Oral Daily Parks Ranger, DO   50 mg at 05/10/21 1011  ? magnesium hydroxide (MILK OF MAGNESIA) suspension 30 mL  30 mL Oral Daily PRN Sherlon Handing, NP      ? QUEtiapine (SEROQUEL) tablet 400 mg  400 mg  Oral QHS Parks Ranger, DO   400 mg at 05/09/21 2223  ? rosuvastatin (CRESTOR) tablet 10 mg  10 mg Oral Daily Parks Ranger, DO   10 mg at 05/10/21 1010  ? traZODone (DESYREL) tablet 100 mg  100 mg Oral QHS PRN Parks Ranger, DO   100 mg at 05/09/21 2223  ? ?PTA Medications: ?Medications Prior to Admission  ?Medication Sig Dispense Refill Last Dose  ? acetaminophen (TYLENOL) 325 MG tablet Take 2 tablets (650 mg total) by mouth every 6 (six) hours as needed for mild pain or moderate pain. 30 tablet 0   ? albuterol (VENTOLIN HFA) 108 (90 Base) MCG/ACT inhaler Inhale 1-2 puffs into the lungs every 4 (four) hours as needed for wheezing or shortness of breath. 1 each 0   ? amoxicillin-clavulanate (AUGMENTIN) 875-125 MG tablet Take 1 tablet by mouth 2 (two) times daily. X 7 days 14 tablet 0   ? clopidogrel (PLAVIX) 75 MG tablet Take 1 tablet (75 mg total) by mouth daily. 30 tablet 1   ? divalproex (DEPAKOTE ER) 500 MG 24 hr tablet Take 500 mg by mouth 3 (three) times daily.     ? divalproex (DEPAKOTE) 500 MG DR tablet Take 1 tablet (500 mg total) by mouth every morning AND 2 tablets (1,000 mg total) at bedtime. Success  tablet 1   ? fluticasone (FLONASE) 50 MCG/ACT nasal spray Place 2 sprays into both nostrils daily. 16 g 0   ? hydrOXYzine (ATARAX/VISTARIL) 50 MG tablet Take 1 tablet (50 mg total) by mouth 3 (three) times daily as needed for anxiety. 30 tablet 1   ? lamoTRIgine (LAMICTAL) 25 MG tablet Take by mouth.     ? Lamotrigine 35 x 25 MG KIT Take 25-100 mg by mouth as directed. 1 kit 0   ? metoprolol succinate (TOPROL-XL) 25 MG 24 hr tablet Take 25 mg by mouth daily.     ? QUEtiapine (SEROQUEL) 400 MG tablet Take 1 tablet (400 mg total) by mouth at bedtime. 30 tablet 1   ? rosuvastatin (CRESTOR) 10 MG tablet Take 1 tablet (10 mg total) by mouth daily. 30 tablet 1   ? Spacer/Aero-Holding Chambers (AEROCHAMBER PLUS) inhaler Use with inhaler 1 each 2   ? tamsulosin (FLOMAX) 0.4 MG CAPS capsule  Take 1 capsule (0.4 mg total) by mouth daily after supper. 30 capsule 1   ? ? ?Patient Stressors: Other: Unable to assess due to manic state   ? ?Patient Strengths: Active sense of humor  ?Communication skills  ? ?Treatment Modalities: Medication Management, Group therapy, Case management,  ?1 to 1 session with clinician, Psychoeducation, Recreational therapy. ? ? ?Physician Treatment Plan for Primary Diagnosis: Bipolar I disorder, current or most recent episode manic, with psychotic features ([place]) ?Long Term Goal(s): Improvement in symptoms so as ready for discharge  ? ?Short Term Goals: Ability to identify changes in lifestyle to reduce recurrence of condition will improve ?Ability to verbalize feelings will improve ?Ability to disclose and discuss suicidal ideas ?Ability to demonstrate self-control will improve ?Ability to identify and develop effective coping behaviors will improve ?Ability to maintain clinical measurements within normal limits will improve ?Compliance with prescribed medications will improve ?Ability to identify triggers associated with substance abuse/mental health issues will improve ? ?Medication Management: Evaluate patient's response, side effects, and tolerance of medication regimen. ? ?Therapeutic Interventions: 1 to 1 sessions, Unit Group sessions and Medication administration. ? ?Evaluation of Outcomes: Progressing ? ?Physician Treatment Plan for Secondary Diagnosis: Principal Problem: ?  Bipolar I disorder, current or most recent episode manic, with psychotic features ([place]) ?Active Problems: ?  Severe manic bipolar 1 disorder with psychotic behavior ([place]) ?  Bipolar 1 disorder ([place]) ? ?Long Term Goal(s): Improvement in symptoms so as ready for discharge  ? ?Short Term Goals: Ability to identify changes in lifestyle to reduce recurrence of condition will improve ?Ability to verbalize feelings will improve ?Ability to disclose and discuss suicidal ideas ?Ability to demonstrate  self-control will improve ?Ability to identify and develop effective coping behaviors will improve ?Ability to maintain clinical measurements within normal limits will improve ?Compliance with prescribed medications will improve ?Ability to identify triggers associated with substance abuse/mental health issues will improve    ? ?Medication Management: Evaluate patient's response, side effects, and tolerance of medication regimen. ? ?Therapeutic Interventions: 1 to 1 sessions, Unit Group sessions and Medication administration. ? ?Evaluation of Outcomes: Progressing ? ? ?RN Treatment Plan for Primary Diagnosis: Bipolar I disorder, current or most recent episode manic, with psychotic features ([place]) ?Long Term Goal(s): Knowledge of disease and therapeutic regimen to maintain health will improve ? ?Short Term Goals: Ability to remain free from injury will improve, Ability to verbalize frustration and anger appropriately will improve, Ability to demonstrate self-control, Ability to participate in decision making will improve, Ability to verbalize feelings will  improve, Ability to identify and develop effective coping behaviors will improve, and Compliance with prescribed medications will improve ? ?Medication Management: RN will administer medications as ordered by provider, will assess and evaluate patient's response and provide education to patient for prescribed medication. RN will report any adverse and/or side effects to prescribing provider. ? ?Therapeutic Interventions: 1 on 1 counseling sessions, Psychoeducation, Medication administration, Evaluate responses to treatment, Monitor vital signs and CBGs as ordered, Perform/monitor CIWA, COWS, AIMS and Fall Risk screenings as ordered, Perform wound care treatments as ordered. ? ?Evaluation of Outcomes: Progressing ? ? ?LCSW Treatment Plan for Primary Diagnosis: Bipolar I disorder, current or most recent episode manic, with psychotic features ([place]) ?Long Term Goal(s):  Safe transition to appropriate next level of care at discharge, Engage patient in therapeutic group addressing interpersonal concerns. ? ?Short Term Goals: Engage patient in aftercare planning with referrals

## 2021-05-10 NOTE — Progress Notes (Signed)
Patient remains alert and oriented, calm, cooperative and behavior improved. Patient denies SI, HI, AVH. Denies anxiety and depression. Ate meals in the day room among peers with good appetite. Medications administered as ordered, patient compliant. Ate meals in the day room with good appetite. Patient is currently walking around the unit, continues to be delusional.Will continue to monitor with Q15 minute safety check in place. ?

## 2021-05-10 NOTE — [facility] Group Notes (Signed)
Group: chair yoga  ?Pt was present, not participating,calm, composed, eating a late brekfast  ?

## 2021-05-11 DIAGNOSIS — F312 Bipolar disorder, current episode manic severe with psychotic features: Secondary | ICD-10-CM | POA: Diagnosis not present

## 2021-05-11 NOTE — Progress Notes (Signed)
The patient came out of his room at the beginning of the shift very agitated, stating that he was an [place], Dr., and owner of the hospital. I tried to redirect the patient as he was walking away from the nursing station without his walker and was very unsteady. This continued until he went to bed. There were several times during the shift when the patient would leave his walker or wander into another patient's room. After he received his hs medications he slept throughout the night. Denies SI/HI, AVH. He is still have active delusions about being different high ranking individuals in the [employer] and of the hospital.  ? ?Angelina Sheriff, RN ? ?

## 2021-05-11 NOTE — Progress Notes (Signed)
D: Pt alert and oriented. Pt denies experiencing any anxiety/depression at this time, however, appears agitated and irritable. Pt denies experiencing any pain at this time. Pt denies experiencing any SI/HI, or AVH at this time, however, remains w/ delusional thoughts (Pt conts to refer to self as a Librarian, academic, president of the hospital etc). Pt welcomed visit w/ daughter during visitation. ?A: Scheduled medications administered to pt, per MD orders. However, refused his PM Depakote, citing inadequate supper intake. Patient ate at least 75% of his dinner. Support and encouragement provided. Routine safety checks conducted q15 minutes.  ?R: No adverse drug reactions noted. Pt interacts minimally with others on the unit. Pt remains safe at this time. Will continue to monitor.  ?

## 2021-05-11 NOTE — [facility] Group Notes (Signed)
Morning group - pt refused to participate in chair yoga ?

## 2021-05-11 NOTE — Progress Notes (Signed)
Austin Rhodes  ?MRN:  [id number] ?Subjective: Austin Rhodes continues to have his moments of irritability and agitation.  He became agitated last night and required as needed Thorazine but took it by mouth.  He has been compliant with his medications.  There is no evidence of any side effects.  He is still delusional but that is fixed and probably not going away. ? ?Principal Problem: Bipolar I disorder, current or most recent episode manic, with psychotic features (HCC) ?Diagnosis: Principal Problem: ?  Bipolar I disorder, current or most recent episode manic, with psychotic features (HCC) ?Active Problems: ?  Severe manic bipolar 1 disorder with psychotic behavior (HCC) ?  Bipolar 1 disorder (HCC) ? ?Total Time spent with patient: 15 minutes ? ?Past Psychiatric History: Extensive ? ?Past Medical History:  ?Past Medical History:  ?Diagnosis Date  ? Bipolar disorder (HCC)   ? Coronary artery disease   ? GERD (gastroesophageal reflux disease)   ? History of multiple strokes   ? PTSD (post-traumatic stress disorder)   ?  ?Past Surgical History:  ?Procedure Laterality Date  ? CARDIAC CATHETERIZATION    ? ?Family History: History reviewed. No pertinent family history. ? ?Social History:  ?Social History  ? ?Substance and Sexual Activity  ?Alcohol Use No  ?   ?Social History  ? ?Substance and Sexual Activity  ?Drug Use Never  ?  ?Social History  ? ?Socioeconomic History  ? Marital status: Divorced  ?  Spouse name: Not on file  ? Number of children: Not on file  ? Years of education: Not on file  ? Highest education level: Not on file  ?Occupational History  ? Not on file  ?Tobacco Use  ? Smoking status: Never  ? Smokeless tobacco: Never  ?Vaping Use  ? Vaping Use: Never used  ?Substance and Sexual Activity  ? Alcohol use: No  ? Drug use: Never  ? Sexual activity: Not on file  ?Other Topics Concern  ? Not on file  ?Social History Narrative  ? Not on file  ? ?Social Determinants  of Health  ? ?Financial Resource Strain: Not on file  ?Food Insecurity: Not on file  ?Transportation Needs: Not on file  ?Physical Activity: Not on file  ?Stress: Not on file  ?Social Connections: Not on file  ? ?Additional Social History:  ?  ?  ?  ?  ?  ?  ?  ?  ?  ?  ?  ? ?Sleep: Good ? ?Appetite:  Good ? ?Current Medications: ?Current Facility-Administered Medications  ?Medication Dose Route Frequency Provider Last Rate Last Admin  ? acetaminophen (TYLENOL) tablet 650 mg  650 mg Oral Q6H PRN Sarina IllHerrick, Elona Yinger Edward, DO   650 mg at 05/01/21 1515  ? albuterol (PROVENTIL) (2.5 MG/3ML) 0.083% nebulizer solution 3 mL  3 mL Inhalation Q4H PRN Sarina IllHerrick, Hilman Kissling Edward, DO      ? alum & mag hydroxide-simeth (MAALOX/MYLANTA) 200-200-20 MG/5ML suspension 30 mL  30 mL Oral Q4H PRN Sarina IllHerrick, Roosevelt Bisher Edward, DO      ? chlorproMAZINE (THORAZINE) tablet 50 mg  50 mg Oral Q4H PRN Sarina IllHerrick, Quandarius Nill Edward, DO   50 mg at 05/10/21 2113  ? Or  ? chlorproMAZINE (THORAZINE) injection 50 mg  50 mg Intramuscular Q4H PRN Sarina IllHerrick, Jahan Friedlander Edward, DO   50 mg at 05/01/21 [id number]  ? clopidogrel (PLAVIX) tablet 75 mg  75 mg Oral Daily Sarina IllHerrick, Ragnar Waas Edward, DO   75 mg at 05/11/21  0814  ? divalproex (DEPAKOTE ER) 24 hr tablet 500 mg  500 mg Oral TID Sarina Ill, DO   500 mg at 05/11/21 4818  ? lamoTRIgine (LAMICTAL) tablet 50 mg  50 mg Oral BID Sarina Ill, DO   50 mg at 05/11/21 0913  ? LORazepam (ATIVAN) tablet 1 mg  1 mg Oral Q4H PRN Sarina Ill, DO   1 mg at 05/05/21 5631  ? losartan (COZAAR) tablet 50 mg  50 mg Oral Daily Sarina Ill, DO   50 mg at 05/11/21 4970  ? magnesium hydroxide (MILK OF MAGNESIA) suspension 30 mL  30 mL Oral Daily PRN Vanetta Mulders, NP      ? QUEtiapine (SEROQUEL) tablet 400 mg  400 mg Oral QHS Sarina Ill, DO   400 mg at 05/09/21 2223  ? rosuvastatin (CRESTOR) tablet 10 mg  10 mg Oral Daily Sarina Ill, DO   10 mg at 05/11/21 0913  ?  traZODone (DESYREL) tablet 100 mg  100 mg Oral QHS PRN Sarina Ill, DO   100 mg at 05/10/21 2113  ? ? ?Lab Results: No results found for this or any previous visit (from the past 48 hour(s)). ? ?Blood Alcohol level:  ?Lab Results  ?Component Value Date  ? ETH <10 04/28/2021  ? ETH <10 03/13/2020  ? ? ?Metabolic Disorder Labs: ?Lab Results  ?Component Value Date  ? HGBA1C 5.7 (H) 05/06/2021  ? MPG 117 05/06/2021  ? MPG 105.41 04/28/2021  ? ?No results found for: PROLACTIN ?Lab Results  ?Component Value Date  ? CHOL 103 05/06/2021  ? TRIG 66 05/06/2021  ? HDL 41 05/06/2021  ? CHOLHDL 2.5 05/06/2021  ? VLDL 13 05/06/2021  ? LDLCALC 49 05/06/2021  ? LDLCALC 40 03/15/2020  ? ? ?Physical Findings: ?AIMS:  , ,  ,  ,    ?CIWA:    ?COWS:    ? ?Musculoskeletal: ?Strength & Muscle Tone: within normal limits ?Gait & Station: normal ?Patient leans: N/A ? ?Psychiatric Specialty Exam: ? ?Presentation  ?General Appearance: Disheveled ? ?Eye Contact:Fair ? ?Speech:Slow ? ?Speech Volume:Decreased ? ?Handedness:Right ? ? ?Mood and Affect  ?Mood:Angry; Anxious; Irritable ? ?Affect:Restricted ? ? ?Thought Process  ?Thought Processes:Disorganized ? ?Descriptions of Associations:Loose ? ?Orientation:Partial ? ?Thought Content:Delusions; Illogical; Paranoid Ideation; Perseveration; Scattered ? ?History of Schizophrenia/Schizoaffective disorder:No ? ?Duration of Psychotic Symptoms:Greater than six months ? ?Hallucinations:No data recorded ?Ideas of Reference:Paranoia ? ?Suicidal Thoughts:No data recorded ?Homicidal Thoughts:No data recorded ? ?Sensorium  ?Memory:Immediate Poor; Recent Poor; Remote Poor ? ?Judgment:Impaired ? ?Insight:Poor ? ? ?Executive Functions  ?Concentration:Poor ? ?Attention Span:Poor ? ?Recall:Poor ? ?Fund of Knowledge:Poor ? ?Language:Poor ? ? ?Psychomotor Activity  ?Psychomotor Activity:No data recorded ? ?Assets  ?Assets:Resilience ? ? ?Sleep  ?Sleep:No data recorded ? ? ?Physical Exam: ?Physical  Exam ?Vitals and nursing note reviewed.  ?Constitutional:   ?   Appearance: Normal appearance. He is normal weight.  ?Neurological:  ?   General: No focal deficit present.  ?   Mental Status: He is alert and oriented to person, place, and time.  ?Psychiatric:     ?   Mood and Affect: Mood normal.     ?   Behavior: Behavior normal.  ? ?Review of Systems  ?Constitutional: Negative.   ?HENT: Negative.    ?Eyes: Negative.   ?Respiratory: Negative.    ?Cardiovascular: Negative.   ?Gastrointestinal: Negative.   ?Genitourinary: Negative.   ?Musculoskeletal: Negative.   ?Skin: Negative.   ?  Neurological: Negative.   ?Endo/Heme/Allergies: Negative.   ?Psychiatric/Behavioral: Negative.    ?Blood pressure (!) 141/94, pulse 95, temperature 97.9 ?F (36.6 ?C), temperature source Oral, resp. rate 18, height 5\' 8"  (1.727 m), weight 76.2 kg, SpO2 100 %. Body mass index is 25.54 kg/m?. ? ? ?Treatment Plan Summary: ?Daily contact with patient to assess and evaluate symptoms and progress in treatment, Medication management, and Plan continue current medications. ? ? , DO ?05/11/2021, 10:58 AM ? ?

## 2021-05-11 NOTE — [facility] Group Notes (Signed)
Midday outdoor  Group: outdoor patio , corn hole, music, socializing. Pt  participated ?

## 2021-05-11 NOTE — Group Note (Signed)
[facility] LCSW Group Therapy Note ? ? ?Group Date: 05/11/2021 ?Start Time: 1310 ?End Time: 1410 ? ? ?Type of Therapy/Topic:  Group Therapy:  Emotion Regulation ? ?Participation Level:  Did Not Attend  ? ?Mood: ? ?Description of Group:   ? The purpose of this group is to assist patients in learning to regulate negative emotions and experience positive emotions. Patients will be guided to discuss ways in which they have been vulnerable to their negative emotions. These vulnerabilities will be juxtaposed with experiences of positive emotions or situations, and patients challenged to use positive emotions to combat negative ones. Special emphasis will be placed on coping with negative emotions in conflict situations, and patients will process healthy conflict resolution skills. ? ?Therapeutic Goals: ?Patient will identify two positive emotions or experiences to reflect on in order to balance out negative emotions:  ?Patient will label two or more emotions that they find the most difficult to experience:  ?Patient will be able to demonstrate positive conflict resolution skills through discussion or role plays:  ? ?Summary of Patient Progress: Patient did not attend group despite encouraged participation. ? ? ? ? ?Therapeutic Modalities:   ?Cognitive Behavioral Therapy ?Feelings Identification ?Dialectical Behavioral Therapy ? ? ?Ileana Ladd Aydden Cumpian, LCSWA ?

## 2021-05-12 DIAGNOSIS — F312 Bipolar disorder, current episode manic severe with psychotic features: Secondary | ICD-10-CM | POA: Diagnosis not present

## 2021-05-12 MED ORDER — FLUPHENAZINE HCL 5 MG PO TABS
5.0000 mg | ORAL_TABLET | ORAL | Status: DC
  Administered 2021-05-12 – 2021-05-18 (×12): 5 mg via ORAL
  Filled 2021-05-12 (×14): qty 1

## 2021-05-12 MED ORDER — LOSARTAN POTASSIUM 25 MG PO TABS
100.0000 mg | ORAL_TABLET | Freq: Every day | ORAL | Status: DC
  Administered 2021-05-13 – 2021-06-02 (×21): 100 mg via ORAL
  Filled 2021-05-12 (×21): qty 4

## 2021-05-12 NOTE — Group Note (Signed)
[facility] LCSW Group Therapy Note ? ? ? ?Group Date: 05/12/2021 ?Start Time: 1400 ?End Time: 1405 ? ?Type of Therapy and Topic:  Group Therapy:  Overcoming Obstacles ? ?Participation Level:  [facility] PARTICIPATION LEVEL: Did Not Attend ? ?Mood: ? ?Description of Group:   ?In this group patients will be encouraged to explore what they see as obstacles to their own wellness and recovery. They will be guided to discuss their thoughts, feelings, and behaviors related to these obstacles. The group will process together ways to cope with barriers, with attention given to specific choices patients can make. Each patient will be challenged to identify changes they are motivated to make in order to overcome their obstacles. This group will be process-oriented, with patients participating in exploration of their own experiences as well as giving and receiving support and challenge from other group members. ? ?Therapeutic Goals: ?1. Patient will identify personal and current obstacles as they relate to admission. ?2. Patient will identify barriers that currently interfere with their wellness or overcoming obstacles.  ?3. Patient will identify feelings, thought process and behaviors related to these barriers. ?4. Patient will identify two changes they are willing to make to overcome these obstacles:  ? ? ?Summary of Patient Progress ? ? ?X ? ? ?Therapeutic Modalities:   ?Cognitive Behavioral Therapy ?Solution Focused Therapy ?Motivational Interviewing ?Relapse Prevention Therapy ? ? ?Tucker Minter A Damauri Minion, LCSWA ?

## 2021-05-12 NOTE — [facility] Group Notes (Signed)
Group: chair yoga, Pt refused  ?

## 2021-05-12 NOTE — Progress Notes (Signed)
Recreation Therapy Notes ? ?Date: 05/12/2021 ?  ?Time: 2:00 pm  ?  ?Location:  Court yard ?  ?Behavioral response: N/A ?  ?Intervention Topic: Strengths    ?  ?Discussion/Intervention: ?Patient refused to attend group. ?  ?Clinical Observations/Feedback:  ?Patient refused to attend group. ?  ?[name] LRT/CTRS ? ? ? ? ? ? ? ?Austin Rhodes ?05/12/2021 2:22 PM ?

## 2021-05-12 NOTE — Plan of Care (Signed)
Patient presents Irritable and Agitated.  Continues to have tangential speech. Patient demands a document is sent to the provider concerning his meal.  Patient then discussed how he own's the hospital and can close the operation.  Patient denies SI/HI/Avh. Continued redirection, emotional support and encouragement given.  Patient did comply with scheduled medications but did demand several snacks.  Adequate fluids and night snack given. ?Problem: Education: ?Goal: Knowledge of  General Education information/materials will improve ?Outcome: Progressing ?  ?Problem: Health Behavior/Discharge Planning: ?Goal: Identification of resources available to assist in meeting health care needs will improve ?Outcome: Progressing ?  ?Problem: Safety: ?Goal: Periods of time without injury will increase ?Outcome: Progressing ?  ?Problem: Health Behavior/Discharge Planning: ?Goal: Compliance with treatment plan for underlying cause of condition will improve ?Outcome: Not Progressing ?  Patient  participates in therapeutic milieu and interacts with peers.  Q 15 minute safety checks in place. Will continue to educate patient in importance of medication compliance.   ?

## 2021-05-12 NOTE — Progress Notes (Signed)
[facility] MD Progress Note ? ?05/12/2021 2:12 PM ?Austin Rhodes  ?MRN:  [id number] ?Subjective: Austin Rhodes continues to be irritable and agitated at times.  He is still very delusional and tangential.  His speech is less pressured and his behavior is improved a little bit.  He is compliant with his medications and denies any side effects.  She still does not have a place to go since he was evicted from his apartment. ? ?Principal Problem: Bipolar I disorder, current or most recent episode manic, with psychotic features (HCC) ?Diagnosis: Principal Problem: ?  Bipolar I disorder, current or most recent episode manic, with psychotic features (HCC) ?Active Problems: ?  Severe manic bipolar 1 disorder with psychotic behavior (HCC) ?  Bipolar 1 disorder (HCC) ? ?Total Time spent with patient: 15 minutes ? ?Past Psychiatric History: [employer]  ? ?Past Medical History:  ?Past Medical History:  ?Diagnosis Date  ? Bipolar disorder (HCC)   ? Coronary artery disease   ? GERD (gastroesophageal reflux disease)   ? History of multiple strokes   ? PTSD (post-traumatic stress disorder)   ?  ?Past Surgical History:  ?Procedure Laterality Date  ? CARDIAC CATHETERIZATION    ? ?Family History: History reviewed. No pertinent family history. ? ?Social History:  ?Social History  ? ?Substance and Sexual Activity  ?Alcohol Use No  ?   ?Social History  ? ?Substance and Sexual Activity  ?Drug Use Never  ?  ?Social History  ? ?Socioeconomic History  ? Marital status: Divorced  ?  Spouse name: Not on file  ? Number of children: Not on file  ? Years of education: Not on file  ? Highest education level: Not on file  ?Occupational History  ? Not on file  ?Tobacco Use  ? Smoking status: Never  ? Smokeless tobacco: Never  ?Vaping Use  ? Vaping Use: Never used  ?Substance and Sexual Activity  ? Alcohol use: No  ? Drug use: Never  ? Sexual activity: Not on file  ?Other Topics Concern  ? Not on file  ?Social History Narrative  ? Not on  file  ? ?Social Determinants of Health  ? ?Financial Resource Strain: Not on file  ?Food Insecurity: Not on file  ?Transportation Needs: Not on file  ?Physical Activity: Not on file  ?Stress: Not on file  ?Social Connections: Not on file  ? ?Additional Social History:  ?  ?  ?  ?  ?  ?  ?  ?  ?  ?  ?  ? ?Sleep: Good ? ?Appetite:  Good ? ?Current Medications: ?Current Facility-Administered Medications  ?Medication Dose Route Frequency Provider Last Rate Last Admin  ? acetaminophen (TYLENOL) tablet 650 mg  650 mg Oral Q6H PRN Sarina Ill, DO   650 mg at 05/01/21 1515  ? albuterol (PROVENTIL) (2.5 MG/3ML) 0.083% nebulizer solution 3 mL  3 mL Inhalation Q4H PRN Sarina Ill, DO      ? alum & mag hydroxide-simeth (MAALOX/MYLANTA) 200-200-20 MG/5ML suspension 30 mL  30 mL Oral Q4H PRN Sarina Ill, DO      ? chlorproMAZINE (THORAZINE) tablet 50 mg  50 mg Oral Q4H PRN Sarina Ill, DO   50 mg at 05/10/21 2113  ? Or  ? chlorproMAZINE (THORAZINE) injection 50 mg  50 mg Intramuscular Q4H PRN Sarina Ill, DO   50 mg at 05/01/21 1191  ? clopidogrel (PLAVIX) tablet 75 mg  75 mg Oral Daily Sarina Ill, DO  75 mg at 05/12/21 1237  ? divalproex (DEPAKOTE ER) 24 hr tablet 500 mg  500 mg Oral TID Sarina Ill, DO   500 mg at 05/12/21 1238  ? lamoTRIgine (LAMICTAL) tablet 50 mg  50 mg Oral BID Sarina Ill, DO   50 mg at 05/12/21 1237  ? LORazepam (ATIVAN) tablet 1 mg  1 mg Oral Q4H PRN Sarina Ill, DO   1 mg at 05/05/21 9826  ? [START ON 05/13/2021] losartan (COZAAR) tablet 100 mg  100 mg Oral Daily Sarina Ill, DO      ? magnesium hydroxide (MILK OF MAGNESIA) suspension 30 mL  30 mL Oral Daily PRN Vanetta Mulders, NP      ? QUEtiapine (SEROQUEL) tablet 400 mg  400 mg Oral QHS Sarina Ill, DO   400 mg at 05/11/21 2201  ? rosuvastatin (CRESTOR) tablet 10 mg  10 mg Oral Daily Sarina Ill, DO   10  mg at 05/12/21 1237  ? traZODone (DESYREL) tablet 100 mg  100 mg Oral QHS PRN Sarina Ill, DO   100 mg at 05/11/21 2202  ? ? ?Lab Results: No results found for this or any previous visit (from the past 48 hour(s)). ? ?Blood Alcohol level:  ?Lab Results  ?Component Value Date  ? ETH <10 04/28/2021  ? ETH <10 03/13/2020  ? ? ?Metabolic Disorder Labs: ?Lab Results  ?Component Value Date  ? HGBA1C 5.7 (H) 05/06/2021  ? MPG 117 05/06/2021  ? MPG 105.41 04/28/2021  ? ?No results found for: PROLACTIN ?Lab Results  ?Component Value Date  ? CHOL 103 05/06/2021  ? TRIG 66 05/06/2021  ? HDL 41 05/06/2021  ? CHOLHDL 2.5 05/06/2021  ? VLDL 13 05/06/2021  ? LDLCALC 49 05/06/2021  ? LDLCALC 40 03/15/2020  ? ? ?Physical Findings: ?AIMS:  , ,  ,  ,    ?CIWA:    ?COWS:    ? ?Musculoskeletal: ?Strength & Muscle Tone: within normal limits ?Gait & Station: normal ?Patient leans: N/A ? ?Psychiatric Specialty Exam: ? ?Presentation  ?General Appearance: Disheveled ? ?Eye Contact:Fair ? ?Speech:Slow ? ?Speech Volume:Decreased ? ?Handedness:Right ? ? ?Mood and Affect  ?Mood:Angry; Anxious; Irritable ? ?Affect:Restricted ? ? ?Thought Process  ?Thought Processes:Disorganized ? ?Descriptions of Associations:Loose ? ?Orientation:Partial ? ?Thought Content:Delusions; Illogical; Paranoid Ideation; Perseveration; Scattered ? ?History of Schizophrenia/Schizoaffective disorder:No ? ?Duration of Psychotic Symptoms:Greater than six months ? ?Hallucinations:No data recorded ?Ideas of Reference:Paranoia ? ?Suicidal Thoughts:No data recorded ?Homicidal Thoughts:No data recorded ? ?Sensorium  ?Memory:Immediate Poor; Recent Poor; Remote Poor ? ?Judgment:Impaired ? ?Insight:Poor ? ? ?Executive Functions  ?Concentration:Poor ? ?Attention Span:Poor ? ?Recall:Poor ? ?Fund of Knowledge:Poor ? ?Language:Poor ? ? ?Psychomotor Activity  ?Psychomotor Activity:No data recorded ? ?Assets  ?Assets:Resilience ? ? ?Sleep  ?Sleep:No data recorded ? ? ?Physical  Exam: ?Physical Exam ?Vitals and nursing note reviewed.  ?Constitutional:   ?   Appearance: Normal appearance. He is normal weight.  ?Neurological:  ?   General: No focal deficit present.  ?   Mental Status: He is alert and oriented to person, place, and time.  ?Psychiatric:     ?   Attention and Perception: Attention and perception normal.     ?   Mood and Affect: Mood normal. Affect is labile and inappropriate.     ?   Speech: Speech is rapid and pressured and tangential.     ?   Behavior: Behavior is agitated.     ?  Thought Content: Thought content is paranoid and delusional.     ?   Cognition and Memory: Cognition and memory normal.     ?   Judgment: Judgment is impulsive and inappropriate.  ? ?Review of Systems  ?Constitutional: Negative.   ?HENT: Negative.    ?Eyes: Negative.   ?Respiratory: Negative.    ?Cardiovascular: Negative.   ?Gastrointestinal: Negative.   ?Genitourinary: Negative.   ?Musculoskeletal: Negative.   ?Skin: Negative.   ?Neurological: Negative.   ?Endo/Heme/Allergies: Negative.   ?Psychiatric/Behavioral: Negative.    ?Blood pressure (!) 150/98, pulse (!) 108, temperature 98 ?F (36.7 ?C), temperature source Oral, resp. rate 18, height 5\' 8"  (1.727 m), weight 76.2 kg, SpO2 99 %. Body mass index is 25.54 kg/m?. ? ? ?Treatment Plan Summary: ?Daily contact with patient to assess and evaluate symptoms and progress in treatment, Medication management, and Plan he has maximized his Depakote so we will start Prolixin 5 mg twice a day. ? ?Sarina Illichard Edward Lakshmi Sundeen, DO ?05/12/2021, 2:12 PM ? ?

## 2021-05-12 NOTE — Plan of Care (Signed)
Patient alert and oriented times 2. Patient is calm and pleasant. Denies pain at this time. Speech is soft and tangential. Denies AVH, SI, and HI. Denies anxiety and depression. Uses walker and wheelchair at times. States he slept good last night. Morning meds given whole by mouth W/O difficulty. Ate breakfast in day room- appetite good. Patient remains on unit with Q15 minute checks in place.  ? ? ?Problem: Education: ?Goal: Knowledge of [facility] Education information/materials will improve ?Outcome: Progressing ?Goal: Mental status will improve ?Outcome: Progressing ?  ?Problem: Health Behavior/Discharge Planning: ?Goal: Identification of resources available to assist in meeting health care needs will improve ?Outcome: Progressing ?Goal: Compliance with treatment plan for underlying cause of condition will improve ?Outcome: Progressing ?  ?Problem: Safety: ?Goal: Periods of time without injury will increase ?Outcome: Progressing ?  ?Problem: Education: ?Goal: Knowledge of General Education information will improve ?Description: Including pain rating scale, medication(s)/side effects and non-pharmacologic comfort measures ?Outcome: Progressing ?  ?Problem: Health Behavior/Discharge Planning: ?Goal: Ability to manage health-related needs will improve ?Outcome: Progressing ?  ?Problem: Clinical Measurements: ?Goal: Ability to maintain clinical measurements within normal limits will improve ?Outcome: Progressing ?Goal: Will remain free from infection ?Outcome: Progressing ?Goal: Diagnostic test results will improve ?Outcome: Progressing ?Goal: Respiratory complications will improve ?Outcome: Progressing ?Goal: Cardiovascular complication will be avoided ?Outcome: Progressing ?  ?Problem: Activity: ?Goal: Risk for activity intolerance will decrease ?Outcome: Progressing ?  ?Problem: Coping: ?Goal: Level of anxiety will decrease ?Outcome: Progressing ?  ?Problem: Elimination: ?Goal: Will not experience  complications related to bowel motility ?Outcome: Progressing ?Goal: Will not experience complications related to urinary retention ?Outcome: Progressing ?  ?Problem: Pain Managment: ?Goal: General experience of comfort will improve ?Outcome: Progressing ?  ?Problem: Safety: ?Goal: Ability to remain free from injury will improve ?Outcome: Progressing ?  ?Problem: Skin Integrity: ?Goal: Risk for impaired skin integrity will decrease ?Outcome: Progressing ?  ?

## 2021-05-12 NOTE — Plan of Care (Signed)
  Problem: Group Participation Goal: STG - Patient will focus on task/topic with 2 prompts from staff within 5 recreation therapy group sessions Description: STG - Patient will focus on task/topic with 2 prompts from staff within 5 recreation therapy group sessions Outcome: Progressing   

## 2021-05-13 DIAGNOSIS — F312 Bipolar disorder, current episode manic severe with psychotic features: Secondary | ICD-10-CM | POA: Diagnosis not present

## 2021-05-13 NOTE — [facility] Counselor (Signed)
CSW was contacted by Cheryl Flash, [phone], pt's daughter. She stated that she has found a placement for her father at an assisted living facility. Patient has been accepted for admission to [facility], [address] [place], [address].  ? ?CSW will contact admissions coordinator regarding in person visit to unit to visit pt. CSW will reach out to [place], [phone] to establish a date and time.  ? ?Amil Amen stated that pt will need a PPD test before he can be admitted to facility. CSW will follow up with medical staff to request PPD test for pt.  ? ?No other requests were made. Conversation ended without incident.  ? ?Shade Rivenbark [place], MSW, LCSW-A ?4/11/202311:56 AM  ?

## 2021-05-13 NOTE — Plan of Care (Signed)
Patient remain alert with intermittent confusion and delusional. Denies SI, HI, and AVH. Denies pain or discomfort. Denies anxiety and depression. Ate breakfast in the day room among peers and tolerated well. Compliant with all due am medications. Continue with delusion of grandeur, patient believe his a physician. Had an incontinent episode assisted with peri care and clean scrub provided.  ?Problem: Education: ?Goal: Knowledge of [facility] Education information/materials will improve ?Outcome: Progressing ?Goal: Mental status will improve ?Outcome: Progressing ?  ?Problem: Health Behavior/Discharge Planning: ?Goal: Identification of resources available to assist in meeting health care needs will improve ?Outcome: Progressing ?Goal: Compliance with treatment plan for underlying cause of condition will improve ?Outcome: Progressing ?  ?Problem: Safety: ?Goal: Periods of time without injury will increase ?Outcome: Progressing ?  ?Problem: Education: ?Goal: Knowledge of General Education information will improve ?Description: Including pain rating scale, medication(s)/side effects and non-pharmacologic comfort measures ?Outcome: Progressing ?  ?Problem: Health Behavior/Discharge Planning: ?Goal: Ability to manage health-related needs will improve ?Outcome: Progressing ?  ?Problem: Clinical Measurements: ?Goal: Ability to maintain clinical measurements within normal limits will improve ?Outcome: Progressing ?Goal: Will remain free from infection ?Outcome: Progressing ?Goal: Diagnostic test results will improve ?Outcome: Progressing ?Goal: Respiratory complications will improve ?Outcome: Progressing ?Goal: Cardiovascular complication will be avoided ?Outcome: Progressing ?  ?Problem: Activity: ?Goal: Risk for activity intolerance will decrease ?Outcome: Progressing ?  ?Problem: Nutrition: ?Goal: Adequate nutrition will be maintained ?Outcome: Progressing ?  ?Problem: Coping: ?Goal: Level of anxiety will  decrease ?Outcome: Progressing ?  ?Problem: Elimination: ?Goal: Will not experience complications related to bowel motility ?Outcome: Progressing ?Goal: Will not experience complications related to urinary retention ?Outcome: Progressing ?  ?Problem: Pain Managment: ?Goal: General experience of comfort will improve ?Outcome: Progressing ?  ?Problem: Safety: ?Goal: Ability to remain free from injury will improve ?Outcome: Progressing ?  ?Problem: Skin Integrity: ?Goal: Risk for impaired skin integrity will decrease ?Outcome: Progressing ?  ?

## 2021-05-13 NOTE — Progress Notes (Signed)
Pt standing at nurses' station; calm, cooperative. Pt states "I'm feeling much better." He currently denies SI/HI/AVH. He states that he sleeps "all the time" and describes his appetite as "great". He also denies anxiety and depression and states "I'm just dealing with the normal stuff when you're cooped up." No acute distress noted. ?

## 2021-05-13 NOTE — Group Note (Signed)
LCSW Group Therapy Note ? ?Group Date: 05/13/2021 ?Start Time: 1345 ?End Time: 1420 ? ? ?Type of Therapy and Topic:  Group Therapy - Healthy vs Unhealthy Coping Skills ? ?Participation Level:  Did Not Attend  ? ?Description of Group ?The focus of this group was to determine what unhealthy coping techniques typically are used by group members and what healthy coping techniques would be helpful in coping with various problems. Patients were guided in becoming aware of the differences between healthy and unhealthy coping techniques. Patients were asked to identify 2-3 healthy coping skills they would like to learn to use more effectively. ? ?Therapeutic Goals ?Patients learned that coping is what human beings do all day long to deal with various situations in their lives ?Patients defined and discussed healthy vs unhealthy coping techniques ?Patients identified their preferred coping techniques and identified whether these were healthy or unhealthy ?Patients determined 2-3 healthy coping skills they would like to become more familiar with and use more often. ?Patients provided support and ideas to each other ? ? ?Summary of Patient Progress:   ?X ? ? ?Therapeutic Modalities ?Cognitive Behavioral Therapy ?Motivational Interviewing ? ?Gerardine Peltz A [place], LCSWA ?05/13/2021  6:20 PM   ?

## 2021-05-13 NOTE — Progress Notes (Signed)
Recreation Therapy Notes ? ? ?Date: 05/13/2021 ? ?Time: 1:45PM   ? ?Location: Courtyard   ? ?Behavioral response: N/A ?  ?Intervention Topic: Social skills   ? ?Discussion/Intervention: ?Patient refused to attend group.  ? ?Clinical Observations/Feedback:  ?Patient refused to attend group.  ?  ?Redford Behrle LRT/CTRS ? ? ? ? ? ? ? ?Austin Rhodes ?05/13/2021 4:03 PM ?

## 2021-05-13 NOTE — Progress Notes (Signed)
Patient is seen on the milieu being demanding and delusional. He insists that he is the president and needs to speak to different public officials. Pt was redirected multiple times. He is irritable and agitated. He also refuses to continuously use his walker. He denies SI, HI, AVH, depression, and anxiety. He is AAOx3. He is disoriented to the situation. He is medication complaint. He is safe on the unit at this time and is in no distress. Q 15 minute safety checks in place.  ?

## 2021-05-13 NOTE — [facility] Group Notes (Signed)
[facility] Group Notes:  (Nursing/MHT/Case Management/Adjunct) ? ?Date:  05/13/2021  ?Time:  9:30 AM ? ?Type of Therapy:  Psychoeducational Skills ? ?Participation Level:  Did Not Attend ? ?Participation Quality:   N/A ? ?Affect:   N/A ? ?Cognitive:   N/A ? ?Insight:  None ? ?Engagement in Group:   N/A ? ?Modes of Intervention:   N/A ? ?Summary of Progress/Problems: ?The pt. refused and did not attend group. ?Barbaraann Rondo ?05/13/2021, 12:17 PM ?

## 2021-05-13 NOTE — Progress Notes (Signed)
[facility] ?05/13/2021 2:01 PM ?Austin ReedyMaurice [name]  ?MRN:  [id number] ?Subjective: Austin AlstromMaurice continues to have fixed delusions.  He says he is an abnormal on the seventh week.  He has been compliant with his medications.  I started him on Prolixin yesterday and he is taking it and denies any side effects.  So far, less agitation. ? ?Principal Problem: Bipolar I disorder, current or most recent episode manic, with psychotic features (HCC) ?Diagnosis: Principal Problem: ?  Bipolar I disorder, current or most recent episode manic, with psychotic features (HCC) ?Active Problems: ?  Severe manic bipolar 1 disorder with psychotic behavior (HCC) ?  Bipolar 1 disorder (HCC) ? ?Total Time spent with patient: 15 minutes ? ?Past Psychiatric History: Extensive ? ?Past Medical History:  ?Past Medical History:  ?Diagnosis Date  ? Bipolar disorder (HCC)   ? Coronary artery disease   ? GERD (gastroesophageal reflux disease)   ? History of multiple strokes   ? PTSD (post-traumatic stress disorder)   ?  ?Past Surgical History:  ?Procedure Laterality Date  ? CARDIAC CATHETERIZATION    ? ?Family History: History reviewed. No pertinent family history. ? ?Social History:  ?Social History  ? ?Substance and Sexual Activity  ?Alcohol Use No  ?   ?Social History  ? ?Substance and Sexual Activity  ?Drug Use Never  ?  ?Social History  ? ?Socioeconomic History  ? Marital status: Divorced  ?  Spouse name: Not on file  ? Number of children: Not on file  ? Years of education: Not on file  ? Highest education level: Not on file  ?Occupational History  ? Not on file  ?Tobacco Use  ? Smoking status: Never  ? Smokeless tobacco: Never  ?Vaping Use  ? Vaping Use: Never used  ?Substance and Sexual Activity  ? Alcohol use: No  ? Drug use: Never  ? Sexual activity: Not on file  ?Other Topics Concern  ? Not on file  ?Social History Narrative  ? Not on file  ? ?Social Determinants of Health  ? ?Financial Resource Strain: Not on file  ?Food  Insecurity: Not on file  ?Transportation Needs: Not on file  ?Physical Activity: Not on file  ?Stress: Not on file  ?Social Connections: Not on file  ? ?Additional Social History:  ?  ?  ?  ?  ?  ?  ?  ?  ?  ?  ?  ? ?Sleep: Good ? ?Appetite:  Good ? ?Current Medications: ?Current Facility-Administered Medications  ?Medication Dose Route Frequency Provider Last Rate Last Admin  ? acetaminophen (TYLENOL) tablet 650 mg  650 mg Oral Q6H PRN Sarina IllHerrick, Janit Cutter Edward, DO   650 mg at 05/01/21 1515  ? albuterol (PROVENTIL) (2.5 MG/3ML) 0.083% nebulizer solution 3 mL  3 mL Inhalation Q4H PRN Sarina IllHerrick, Miria Cappelli Edward, DO      ? alum & mag hydroxide-simeth (MAALOX/MYLANTA) 200-200-20 MG/5ML suspension 30 mL  30 mL Oral Q4H PRN Sarina IllHerrick, Cambell Stanek Edward, DO      ? chlorproMAZINE (THORAZINE) tablet 50 mg  50 mg Oral Q4H PRN Sarina IllHerrick, Carrina Schoenberger Edward, DO   50 mg at 05/12/21 2115  ? Or  ? chlorproMAZINE (THORAZINE) injection 50 mg  50 mg Intramuscular Q4H PRN Sarina IllHerrick, Carnie Bruemmer Edward, DO   50 mg at 05/01/21 [id number]  ? clopidogrel (PLAVIX) tablet 75 mg  75 mg Oral Daily Sarina IllHerrick, Arlis Everly Edward, DO   75 mg at 05/13/21 [id number]  ? divalproex (DEPAKOTE ER) 24 hr tablet 500  mg  500 mg Oral TID Sarina Ill, DO   500 mg at 05/13/21 7782  ? fluPHENAZine (PROLIXIN) tablet 5 mg  5 mg Oral BH-[id number] Sarina Ill, DO   5 mg at 05/13/21 4235  ? lamoTRIgine (LAMICTAL) tablet 50 mg  50 mg Oral BID Sarina Ill, DO   50 mg at 05/13/21 3614  ? LORazepam (ATIVAN) tablet 1 mg  1 mg Oral Q4H PRN Sarina Ill, DO   1 mg at 05/05/21 4315  ? losartan (COZAAR) tablet 100 mg  100 mg Oral Daily Sarina Ill, DO   100 mg at 05/13/21 0920  ? magnesium hydroxide (MILK OF MAGNESIA) suspension 30 mL  30 mL Oral Daily PRN Vanetta Mulders, NP      ? QUEtiapine (SEROQUEL) tablet 400 mg  400 mg Oral QHS Sarina Ill, DO   400 mg at 05/12/21 2115  ? rosuvastatin (CRESTOR) tablet 10 mg  10 mg Oral Daily Sarina Ill, DO   10 mg at 05/13/21 4008  ? traZODone (DESYREL) tablet 100 mg  100 mg Oral QHS PRN Sarina Ill, DO   100 mg at 05/12/21 2115  ? ? ?Lab Results: No results found for this or any previous visit (from the past 48 hour(s)). ? ?Blood Alcohol level:  ?Lab Results  ?Component Value Date  ? ETH <10 04/28/2021  ? ETH <10 03/13/2020  ? ? ?Metabolic Disorder Labs: ?Lab Results  ?Component Value Date  ? HGBA1C 5.7 (H) 05/06/2021  ? MPG 117 05/06/2021  ? MPG 105.41 04/28/2021  ? ?No results found for: PROLACTIN ?Lab Results  ?Component Value Date  ? CHOL 103 05/06/2021  ? TRIG 66 05/06/2021  ? HDL 41 05/06/2021  ? CHOLHDL 2.5 05/06/2021  ? VLDL 13 05/06/2021  ? LDLCALC 49 05/06/2021  ? LDLCALC 40 03/15/2020  ? ? ?Physical Findings: ?AIMS:  , ,  ,  ,    ?CIWA:    ?COWS:    ? ?Musculoskeletal: ?Strength & Muscle Tone: within normal limits ?Gait & Station: unsteady ?Patient leans: N/A ? ?Psychiatric Specialty Exam: ? ?Presentation  ?General Appearance: Disheveled ? ?Eye Contact:Fair ? ?Speech:Slow ? ?Speech Volume:Decreased ? ?Handedness:Right ? ? ?Mood and Affect  ?Mood:Angry; Anxious; Irritable ? ?Affect:Restricted ? ? ?Thought Process  ?Thought Processes:Disorganized ? ?Descriptions of Associations:Loose ? ?Orientation:Partial ? ?Thought Content:Delusions; Illogical; Paranoid Ideation; Perseveration; Scattered ? ?History of Schizophrenia/Schizoaffective disorder:No ? ?Duration of Psychotic Symptoms:Greater than six months ? ?Hallucinations:No data recorded ?Ideas of Reference:Paranoia ? ?Suicidal Thoughts:No data recorded ?Homicidal Thoughts:No data recorded ? ?Sensorium  ?Memory:Immediate Poor; Recent Poor; Remote Poor ? ?Judgment:Impaired ? ?Insight:Poor ? ? ?Executive Functions  ?Concentration:Poor ? ?Attention Span:Poor ? ?Recall:Poor ? ?Fund of Knowledge:Poor ? ?Language:Poor ? ? ?Psychomotor Activity  ?Psychomotor Activity:No data recorded ? ?Assets  ?Assets:Resilience ? ? ?Sleep  ?Sleep:No  data recorded ? ? ?Physical Exam: ?Physical Exam ?Vitals and nursing note reviewed.  ?Constitutional:   ?   Appearance: Normal appearance. He is normal weight.  ?Neurological:  ?   General: No focal deficit present.  ?   Mental Status: He is alert and oriented to person, place, and time.  ?Psychiatric:     ?   Attention and Perception: Attention normal.     ?   Mood and Affect: Mood normal. Affect is labile.     ?   Speech: Speech is tangential.     ?   Behavior: Behavior normal. Behavior is cooperative.     ?  Thought Content: Thought content is paranoid and delusional.     ?   Cognition and Memory: Memory normal. Cognition is impaired.     ?   Judgment: Judgment is impulsive and inappropriate.  ? ?Review of Systems  ?Constitutional: Negative.   ?HENT: Negative.    ?Eyes: Negative.   ?Respiratory: Negative.    ?Cardiovascular: Negative.   ?Gastrointestinal: Negative.   ?Genitourinary: Negative.   ?Musculoskeletal: Negative.   ?Skin: Negative.   ?Neurological: Negative.   ?Endo/Heme/Allergies: Negative.   ?Psychiatric/Behavioral: Negative.    ?Blood pressure (!) 150/98, pulse (!) 108, temperature 98 ?F (36.7 ?C), temperature source Oral, resp. rate 18, height 5\' 8"  (1.727 m), weight 76.2 kg, SpO2 99 %. Body mass index is 25.54 kg/m?. ? ? ?Treatment Plan Summary: ?Daily contact with patient to assess and evaluate symptoms and progress in treatment, Medication management, and Plan continue current medications.  Social work in with family.  No place to go at the moment. ? ?Aeronautical engineer, DO ?05/13/2021, 2:01 PM ? ?

## 2021-05-14 DIAGNOSIS — F312 Bipolar disorder, current episode manic severe with psychotic features: Secondary | ICD-10-CM | POA: Diagnosis not present

## 2021-05-14 NOTE — Progress Notes (Signed)
Recreation Therapy Notes ? ? ?Date: 05/14/2021 ? ?Time: 1:45pm   ? ?Location: Dayroom   ? ?Behavioral response: N/A ?  ?Intervention Topic: Problem Solving  ? ?Discussion/Intervention: ?Patient refused to attend group.  ? ?Clinical Observations/Feedback:  ?Patient refused to attend group.  ?  ?[name] LRT/CTRS ? ? ? ? ? ? ? ? ?Austin Rhodes ?05/14/2021 3:14 PM ?

## 2021-05-14 NOTE — Progress Notes (Signed)
Patient is calm and cooperative. He denies SI, HI, AVH, and pain. He states that he slept, "so-so". Patient came to dayroom and ate breakfast before returning to his room. Patient is compliant with morning medications. He remains safe on the unit at this time. ?

## 2021-05-14 NOTE — Progress Notes (Signed)
[facility] MD Progress Note ? ?05/14/2021 12:13 PM ?Austin Rhodes  ?MRN:  [id number] ?Subjective: Austin Rhodes is seen on rounds today.  His behavior has been much improved.  He still delusional but more pleasant and cooperative and compliant.  He has not had any outbursts since starting him on Prolixin.  He is tolerating the medications without any problems.  Social work is in [employer] with his son for discharge planning. ? ?Principal Problem: Bipolar I disorder, current or most recent episode manic, with psychotic features ([place]) ?Diagnosis: Principal Problem: ?  Bipolar I disorder, current or most recent episode manic, with psychotic features ([place]) ?Active Problems: ?  Severe manic bipolar 1 disorder with psychotic behavior ([place]) ?  Bipolar 1 disorder ([place]) ? ?Total Time spent with patient: 15 minutes ? ?Past Psychiatric History: Extensive ? ?Past Medical History:  ?Past Medical History:  ?Diagnosis Date  ? Bipolar disorder ([place])   ? Coronary artery disease   ? GERD (gastroesophageal reflux disease)   ? History of multiple strokes   ? PTSD (post-traumatic stress disorder)   ?  ?Past Surgical History:  ?Procedure Laterality Date  ? CARDIAC CATHETERIZATION    ? ?Family History: History reviewed. No pertinent family history. ? ?Social History:  ?Social History  ? ?Substance and Sexual Activity  ?Alcohol Use No  ?   ?Social History  ? ?Substance and Sexual Activity  ?Drug Use Never  ?  ?Social History  ? ?Socioeconomic History  ? Marital status: Divorced  ?  Spouse name: Not on file  ? Number of children: Not on file  ? Years of education: Not on file  ? Highest education level: Not on file  ?Occupational History  ? Not on file  ?Tobacco Use  ? Smoking status: Never  ? Smokeless tobacco: Never  ?Vaping Use  ? Vaping Use: Never used  ?Substance and Sexual Activity  ? Alcohol use: No  ? Drug use: Never  ? Sexual activity: Not on file  ?Other Topics Concern  ? Not on file  ?Social History Narrative  ? Not on file  ? ?Social  Determinants of Health  ? ?Financial Resource Strain: Not on file  ?Food Insecurity: Not on file  ?Transportation Needs: Not on file  ?Physical Activity: Not on file  ?Stress: Not on file  ?Social Connections: Not on file  ? ?Additional Social History:  ?  ?  ?  ?  ?  ?  ?  ?  ?  ?  ?  ? ?Sleep: Good ? ?Appetite:  Good ? ?Current Medications: ?Current Facility-Administered Medications  ?Medication Dose Route Frequency Provider Last Rate Last Admin  ? acetaminophen (TYLENOL) tablet 650 mg  650 mg Oral Q6H PRN Parks Ranger, DO   650 mg at 05/01/21 1515  ? albuterol (PROVENTIL) (2.5 MG/3ML) 0.083% nebulizer solution 3 mL  3 mL Inhalation Q4H PRN Parks Ranger, DO      ? alum & mag hydroxide-simeth (MAALOX/MYLANTA) 200-200-20 MG/5ML suspension 30 mL  30 mL Oral Q4H PRN Parks Ranger, DO      ? chlorproMAZINE (THORAZINE) tablet 50 mg  50 mg Oral Q4H PRN Parks Ranger, DO   50 mg at 05/13/21 2129  ? Or  ? chlorproMAZINE (THORAZINE) injection 50 mg  50 mg Intramuscular Q4H PRN Parks Ranger, DO   50 mg at 05/01/21 [id number]  ? clopidogrel (PLAVIX) tablet 75 mg  75 mg Oral Daily Parks Ranger, DO   75 mg at 05/14/21  [id number]  ? divalproex (DEPAKOTE ER) 24 hr tablet 500 mg  500 mg Oral TID Parks Ranger, DO   500 mg at 05/14/21 [id number]  ? fluPHENAZine (PROLIXIN) tablet 5 mg  5 mg Oral BH-[id number] Parks Ranger, DO   5 mg at 05/14/21 [id number]  ? lamoTRIgine (LAMICTAL) tablet 50 mg  50 mg Oral BID Parks Ranger, DO   50 mg at 05/14/21 [id number]  ? LORazepam (ATIVAN) tablet 1 mg  1 mg Oral Q4H PRN Parks Ranger, DO   1 mg at 05/05/21 [id number]  ? losartan (COZAAR) tablet 100 mg  100 mg Oral Daily Parks Ranger, DO   100 mg at 05/14/21 [id number]  ? magnesium hydroxide (MILK OF MAGNESIA) suspension 30 mL  30 mL Oral Daily PRN Sherlon Handing, NP      ? QUEtiapine (SEROQUEL) tablet 400 mg  400 mg Oral QHS Parks Ranger, DO   400 mg at  05/13/21 2127  ? rosuvastatin (CRESTOR) tablet 10 mg  10 mg Oral Daily Parks Ranger, DO   10 mg at 05/14/21 [id number]  ? traZODone (DESYREL) tablet 100 mg  100 mg Oral QHS PRN Parks Ranger, DO   100 mg at 05/13/21 2129  ? ? ?Lab Results: No results found for this or any previous visit (from the past 48 hour(s)). ? ?Blood Alcohol level:  ?Lab Results  ?Component Value Date  ? ETH <10 04/28/2021  ? ETH <10 03/13/2020  ? ? ?Metabolic Disorder Labs: ?Lab Results  ?Component Value Date  ? HGBA1C 5.7 (H) 05/06/2021  ? MPG 117 05/06/2021  ? MPG 105.41 04/28/2021  ? ?No results found for: PROLACTIN ?Lab Results  ?Component Value Date  ? CHOL 103 05/06/2021  ? TRIG 66 05/06/2021  ? HDL 41 05/06/2021  ? CHOLHDL 2.5 05/06/2021  ? VLDL 13 05/06/2021  ? LDLCALC 49 05/06/2021  ? [place] 40 03/15/2020  ? ? ?Physical Findings: ?AIMS:  , ,  ,  ,    ?CIWA:    ?COWS:    ? ?Musculoskeletal: ?Strength & Muscle Tone: within normal limits ?Gait & Station: normal ?Patient leans: N/A ? ?Psychiatric Specialty Exam: ? ?Presentation  ?General Appearance: Disheveled ? ?Eye Contact:Fair ? ?Speech:Slow ? ?Speech Volume:Decreased ? ?Handedness:Right ? ? ?Mood and Affect  ?Mood:Angry; Anxious; Irritable ? ?Affect:Restricted ? ? ?Thought Process  ?Thought Processes:Disorganized ? ?Descriptions of Associations:Loose ? ?Orientation:Partial ? ?Thought Content:Delusions; Illogical; Paranoid Ideation; Perseveration; Scattered ? ?History of Schizophrenia/Schizoaffective disorder:No ? ?Duration of Psychotic Symptoms:Greater than six months ? ?Hallucinations:No data recorded ?Ideas of Reference:Paranoia ? ?Suicidal Thoughts:No data recorded ?Homicidal Thoughts:No data recorded ? ?Sensorium  ?Memory:Immediate Poor; Recent Poor; Remote Poor ? ?Judgment:Impaired ? ?Insight:Poor ? ? ?Executive Functions  ?Concentration:Poor ? ?Attention Span:Poor ? ?Recall:Poor ? ?Fund of Knowledge:Poor ? ?Language:Poor ? ? ?Psychomotor Activity  ?Psychomotor  Activity:No data recorded ? ?Assets  ?Assets:Resilience ? ? ?Sleep  ?Sleep:No data recorded ? ? ?Physical Exam: ?Physical Exam ?Vitals and nursing note reviewed.  ?Constitutional:   ?   Appearance: Normal appearance. He is normal weight.  ?Neurological:  ?   General: No focal deficit present.  ?   Mental Status: He is alert and oriented to person, place, and time.  ?Psychiatric:     ?   Attention and Perception: Attention and perception normal.     ?   Mood and Affect: Mood normal. Affect is inappropriate.     ?   Speech: Speech is tangential.     ?  Behavior: Behavior normal. Behavior is cooperative.     ?   Thought Content: Thought content is delusional.     ?   Cognition and Memory: Cognition and memory normal.     ?   Judgment: Judgment is inappropriate.  ? ?Review of Systems  ?Constitutional: Negative.   ?HENT: Negative.    ?Eyes: Negative.   ?Respiratory: Negative.    ?Cardiovascular: Negative.   ?Gastrointestinal: Negative.   ?Genitourinary: Negative.   ?Musculoskeletal: Negative.   ?Skin: Negative.   ?Neurological: Negative.   ?Endo/Heme/Allergies: Negative.   ?Psychiatric/Behavioral: Negative.    ?Blood pressure 121/75, pulse 97, temperature (!) 97.5 ?F (36.4 ?C), temperature source Oral, resp. rate 18, height 5\' 8"  (1.727 m), weight 76.2 kg, SpO2 99 %. Body mass index is 25.54 kg/m?. ? ? ?Treatment Plan Summary: ?Daily contact with patient to assess and evaluate symptoms and progress in treatment, Medication management, and Plan continue current medications. ? ?Parks Ranger, DO ?05/14/2021, 12:13 PM ? ?

## 2021-05-15 DIAGNOSIS — F312 Bipolar disorder, current episode manic severe with psychotic features: Secondary | ICD-10-CM | POA: Diagnosis not present

## 2021-05-15 NOTE — [facility] Group Notes (Signed)
[facility] Group Notes:  (Nursing/MHT/Case Management/Adjunct) ? ?Date:  05/15/2021  ?Time:  10:00 AM ? ?Type of Therapy:  Psychoeducational Skills ? ?Participation Level:  Did Not Attend ? ?Participation Quality:   N/A ? ?Affect:   N/A ? ?Cognitive:   N/A ? ?Insight:  None ? ?Engagement in Group:   N/A ? ?Modes of Intervention:  N/A ? ?Summary of Progress/Problems: ?The pt did not attend group, he was in bed asleep. ?Barbaraann Rondo ?05/15/2021, 11:28 AM ?

## 2021-05-15 NOTE — Plan of Care (Signed)
Patient is calm and cooperative, denies SI, HI, and AVH. Denies pain or discomfort at this time. Denies anxiety and depression. Ate breakfast in the day room among peers with good appetite. Compliant with due medications. Patient is currently in his room resting in no apparent distress with Q15 minute safety checks.  ? ?Problem: Education: ?Goal: Knowledge of [facility] Education information/materials will improve ?Outcome: Progressing ?Goal: Mental status will improve ?Outcome: Progressing ?  ?Problem: Health Behavior/Discharge Planning: ?Goal: Identification of resources available to assist in meeting health care needs will improve ?Outcome: Progressing ?Goal: Compliance with treatment plan for underlying cause of condition will improve ?Outcome: Progressing ?  ?Problem: Safety: ?Goal: Periods of time without injury will increase ?Outcome: Progressing ?  ?Problem: Education: ?Goal: Knowledge of General Education information will improve ?Description: Including pain rating scale, medication(s)/side effects and non-pharmacologic comfort measures ?Outcome: Progressing ?  ?Problem: Health Behavior/Discharge Planning: ?Goal: Ability to manage health-related needs will improve ?Outcome: Progressing ?  ?Problem: Clinical Measurements: ?Goal: Ability to maintain clinical measurements within normal limits will improve ?Outcome: Progressing ?Goal: Will remain free from infection ?Outcome: Progressing ?Goal: Diagnostic test results will improve ?Outcome: Progressing ?Goal: Respiratory complications will improve ?Outcome: Progressing ?Goal: Cardiovascular complication will be avoided ?Outcome: Progressing ?  ?Problem: Activity: ?Goal: Risk for activity intolerance will decrease ?Outcome: Progressing ?  ?Problem: Nutrition: ?Goal: Adequate nutrition will be maintained ?Outcome: Progressing ?  ?Problem: Coping: ?Goal: Level of anxiety will decrease ?Outcome: Progressing ?  ?Problem: Elimination: ?Goal: Will not  experience complications related to bowel motility ?Outcome: Progressing ?Goal: Will not experience complications related to urinary retention ?Outcome: Progressing ?  ?Problem: Pain Managment: ?Goal: General experience of comfort will improve ?Outcome: Progressing ?  ?Problem: Safety: ?Goal: Ability to remain free from injury will improve ?Outcome: Progressing ?  ?Problem: Skin Integrity: ?Goal: Risk for impaired skin integrity will decrease ?Outcome: Progressing ?  ?

## 2021-05-15 NOTE — BH IP Treatment Plan (Addendum)
Interdisciplinary Treatment and Diagnostic Plan Update ? ?05/15/2021 ?Time of Session: 9:30AM ?Austin Rhodes ?MRN: [id number] ? ?Principal Diagnosis: Bipolar I disorder, current or most recent episode manic, with psychotic features (Austin Rhodes) ? ?Secondary Diagnoses: Principal Problem: ?  Bipolar I disorder, current or most recent episode manic, with psychotic features (Austin Rhodes) ?Active Problems: ?  Severe manic bipolar 1 disorder with psychotic behavior (Austin Rhodes) ?  Bipolar 1 disorder (Austin Rhodes) ? ? ?Current Medications:  ?Current Facility-Administered Medications  ?Medication Dose Route Frequency Provider Last Rate Last Admin  ? acetaminophen (TYLENOL) tablet 650 mg  650 mg Oral Q6H PRN Parks Ranger, DO   650 mg at 05/01/21 1515  ? albuterol (PROVENTIL) (2.5 MG/3ML) 0.083% nebulizer solution 3 mL  3 mL Inhalation Q4H PRN Parks Ranger, DO      ? alum & mag hydroxide-simeth (MAALOX/MYLANTA) 200-200-20 MG/5ML suspension 30 mL  30 mL Oral Q4H PRN Parks Ranger, DO      ? chlorproMAZINE (THORAZINE) tablet 50 mg  50 mg Oral Q4H PRN Parks Ranger, DO   50 mg at 05/13/21 2129  ? Or  ? chlorproMAZINE (THORAZINE) injection 50 mg  50 mg Intramuscular Q4H PRN Parks Ranger, DO   50 mg at 05/01/21 7972  ? clopidogrel (PLAVIX) tablet 75 mg  75 mg Oral Daily Parks Ranger, DO   75 mg at 05/15/21 0901  ? divalproex (DEPAKOTE ER) 24 hr tablet 500 mg  500 mg Oral TID Parks Ranger, DO   500 mg at 05/15/21 0901  ? fluPHENAZine (PROLIXIN) tablet 5 mg  5 mg Oral BH-[id number] Parks Ranger, DO   5 mg at 05/15/21 8206  ? lamoTRIgine (LAMICTAL) tablet 50 mg  50 mg Oral BID Parks Ranger, DO   50 mg at 05/15/21 0901  ? LORazepam (ATIVAN) tablet 1 mg  1 mg Oral Q4H PRN Parks Ranger, DO   1 mg at 05/05/21 0156  ? losartan (COZAAR) tablet 100 mg  100 mg Oral Daily Parks Ranger, DO   100 mg at 05/15/21 0900  ? magnesium hydroxide (MILK OF  MAGNESIA) suspension 30 mL  30 mL Oral Daily PRN Sherlon Handing, NP      ? QUEtiapine (SEROQUEL) tablet 400 mg  400 mg Oral QHS Parks Ranger, DO   400 mg at 05/14/21 2207  ? rosuvastatin (CRESTOR) tablet 10 mg  10 mg Oral Daily Parks Ranger, DO   10 mg at 05/15/21 0901  ? traZODone (DESYREL) tablet 100 mg  100 mg Oral QHS PRN Parks Ranger, DO   100 mg at 05/14/21 2207  ? ?PTA Medications: ?Medications Prior to Admission  ?Medication Sig Dispense Refill Last Dose  ? acetaminophen (TYLENOL) 325 MG tablet Take 2 tablets (650 mg total) by mouth every 6 (six) hours as needed for mild pain or moderate pain. 30 tablet 0   ? albuterol (VENTOLIN HFA) 108 (90 Base) MCG/ACT inhaler Inhale 1-2 puffs into the lungs every 4 (four) hours as needed for wheezing or shortness of breath. 1 each 0   ? amoxicillin-clavulanate (AUGMENTIN) 875-125 MG tablet Take 1 tablet by mouth 2 (two) times daily. X 7 days 14 tablet 0   ? clopidogrel (PLAVIX) 75 MG tablet Take 1 tablet (75 mg total) by mouth daily. 30 tablet 1   ? divalproex (DEPAKOTE ER) 500 MG 24 hr tablet Take 500 mg by mouth 3 (three) times daily.     ? divalproex (DEPAKOTE)  500 MG DR tablet Take 1 tablet (500 mg total) by mouth every morning AND 2 tablets (1,000 mg total) at bedtime. 90 tablet 1   ? fluticasone (FLONASE) 50 MCG/ACT nasal spray Place 2 sprays into both nostrils daily. 16 g 0   ? hydrOXYzine (ATARAX/VISTARIL) 50 MG tablet Take 1 tablet (50 mg total) by mouth 3 (three) times daily as needed for anxiety. 30 tablet 1   ? lamoTRIgine (LAMICTAL) 25 MG tablet Take by mouth.     ? Lamotrigine 35 x 25 MG KIT Take 25-100 mg by mouth as directed. 1 kit 0   ? metoprolol succinate (TOPROL-XL) 25 MG 24 hr tablet Take 25 mg by mouth daily.     ? QUEtiapine (SEROQUEL) 400 MG tablet Take 1 tablet (400 mg total) by mouth at bedtime. 30 tablet 1   ? rosuvastatin (CRESTOR) 10 MG tablet Take 1 tablet (10 mg total) by mouth daily. 30 tablet 1   ?  Spacer/Aero-Holding Chambers (AEROCHAMBER PLUS) inhaler Use with inhaler 1 each 2   ? tamsulosin (FLOMAX) 0.4 MG CAPS capsule Take 1 capsule (0.4 mg total) by mouth daily after supper. 30 capsule 1   ? ? ?Patient Stressors: Other: Unable to assess due to manic state   ? ?Patient Strengths: Active sense of humor  ?Communication skills  ? ?Treatment Modalities: Medication Management, Group therapy, Case management,  ?1 to 1 session with clinician, Psychoeducation, Recreational therapy. ? ? ?Physician Treatment Plan for Primary Diagnosis: Bipolar I disorder, current or most recent episode manic, with psychotic features (Austin Rhodes) ?Long Term Goal(s): Improvement in symptoms so as ready for discharge  ? ?Short Term Goals: Ability to identify changes in lifestyle to reduce recurrence of condition will improve ?Ability to verbalize feelings will improve ?Ability to disclose and discuss suicidal ideas ?Ability to demonstrate self-control will improve ?Ability to identify and develop effective coping behaviors will improve ?Ability to maintain clinical measurements within normal limits will improve ?Compliance with prescribed medications will improve ?Ability to identify triggers associated with substance abuse/mental health issues will improve ? ?Medication Management: Evaluate patient's response, side effects, and tolerance of medication regimen. ? ?Therapeutic Interventions: 1 to 1 sessions, Unit Group sessions and Medication administration. ? ?Evaluation of Outcomes: Progressing ? ?Physician Treatment Plan for Secondary Diagnosis: Principal Problem: ?  Bipolar I disorder, current or most recent episode manic, with psychotic features (Austin Rhodes) ?Active Problems: ?  Severe manic bipolar 1 disorder with psychotic behavior ([name]) ?  Bipolar 1 disorder (Austin Rhodes) ? ?Long Term Goal(s): Improvement in symptoms so as ready for discharge  ? ?Short Term Goals: Ability to identify changes in lifestyle to reduce recurrence of condition will  improve ?Ability to verbalize feelings will improve ?Ability to disclose and discuss suicidal ideas ?Ability to demonstrate self-control will improve ?Ability to identify and develop effective coping behaviors will improve ?Ability to maintain clinical measurements within normal limits will improve ?Compliance with prescribed medications will improve ?Ability to identify triggers associated with substance abuse/mental health issues will improve    ? ?Medication Management: Evaluate patient's response, side effects, and tolerance of medication regimen. ? ?Therapeutic Interventions: 1 to 1 sessions, Unit Group sessions and Medication administration. ? ?Evaluation of Outcomes: Progressing ? ? ?RN Treatment Plan for Primary Diagnosis: Bipolar I disorder, current or most recent episode manic, with psychotic features (Austin Rhodes) ?Long Term Goal(s): Knowledge of disease and therapeutic regimen to maintain health will improve ? ?Short Term Goals: Ability to remain free from injury will improve, Ability to verbalize  frustration and anger appropriately will improve, Ability to demonstrate self-control, Ability to participate in decision making will improve, Ability to verbalize feelings will improve, Ability to identify and develop effective coping behaviors will improve, and Compliance with prescribed medications will improve ? ?Medication Management: RN will administer medications as ordered by provider, will assess and evaluate patient's response and provide education to patient for prescribed medication. RN will report any adverse and/or side effects to prescribing provider. ? ?Therapeutic Interventions: 1 on 1 counseling sessions, Psychoeducation, Medication administration, Evaluate responses to treatment, Monitor vital signs and CBGs as ordered, Perform/monitor CIWA, COWS, AIMS and Fall Risk screenings as ordered, Perform wound care treatments as ordered. ? ?Evaluation of Outcomes: Progressing ? ? ?LCSW Treatment Plan for  Primary Diagnosis: Bipolar I disorder, current or most recent episode manic, with psychotic features (Austin Rhodes) ?Long Term Goal(s): Safe transition to appropriate next level of care at discharge, Engage patient in therapeutic

## 2021-05-15 NOTE — Progress Notes (Signed)
Recreation Therapy Notes ? ?Date: 05/15/2021 ? ?Time: 2:05pm   ? ?Location: Craft room    ? ?Behavioral response: N/A ?  ?Intervention Topic: Communication  ? ?Discussion/Intervention: ?Patient refused to attend group.  ? ?Clinical Observations/Feedback:  ?Patient refused to attend group.  ?  ?Jacinto Keil LRT/CTRS ? ? ? ? ? ? ? ? ?Austin Rhodes ?05/15/2021 3:55 PM ?

## 2021-05-15 NOTE — BHH Counselor (Signed)
CSW contacted [place], [phone] in attempt to coordinate for staff to meet pt and discuss discharge and bed acceptance. CSW left contact information with staff person for the admissions coordinator Kennyth Lose, to reach back out to as soon as possible.  ? ?CSW will follow up at another time as needed.  ? ?Zalma Channing [place], MSW, LCSW-A ?4/13/20233:56 PM  ?

## 2021-05-15 NOTE — Progress Notes (Signed)
[facility] MD Progress Note ? ?05/15/2021 11:37 AM ?Dorna Bloom Rhodes  ?MRN:  [id number] ?Subjective: Austin has been compliant with his medications.  I started him on Prolixin a few days back because he was getting more agitated.  He has been much more pleasant and cooperative.  He is still delusional and I believe that is not going to change, anyway.  He has been evicted from his apartment and apparently his son is looking for a new place to live.  He denies any side effects from his medications and there is no evidence of EPS or TD. ? ?Principal Problem: Bipolar I disorder, current or most recent episode manic, with psychotic features ([place]) ?Diagnosis: Principal Problem: ?  Bipolar I disorder, current or most recent episode manic, with psychotic features ([place]) ?Active Problems: ?  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  Bipolar 1 disorder ([place]) ? ?Total Time spent with patient: 15 minutes ? ?Past Psychiatric History:  Multiple hospitalizations for bipolar 1 mania. [employer]  ? ?Past Medical History:  ?Past Medical History:  ?Diagnosis Date  ? Bipolar disorder ([place])   ? Coronary artery disease   ? GERD (gastroesophageal reflux disease)   ? History of multiple strokes   ? PTSD (post-traumatic stress disorder)   ?  ?Past Surgical History:  ?Procedure Laterality Date  ? CARDIAC CATHETERIZATION    ? ?Family History: History reviewed. No pertinent family history. ? ?Social History:  ?Social History  ? ?Substance and Sexual Activity  ?Alcohol Use No  ?   ?Social History  ? ?Substance and Sexual Activity  ?Drug Use Never  ?  ?Social History  ? ?Socioeconomic History  ? Marital status: Divorced  ?  Spouse name: Not on file  ? Number of children: Not on file  ? Years of education: Not on file  ? Highest education level: Not on file  ?Occupational History  ? Not on file  ?Tobacco Use  ? Smoking status: Never  ? Smokeless tobacco: Never  ?Vaping Use  ? Vaping Use: Never used   ?Substance and Sexual Activity  ? Alcohol use: No  ? Drug use: Never  ? Sexual activity: Not on file  ?Other Topics Concern  ? Not on file  ?Social History Narrative  ? Not on file  ? ?Social Determinants of Health  ? ?Financial Resource Strain: Not on file  ?Food Insecurity: Not on file  ?Transportation Needs: Not on file  ?Physical Activity: Not on file  ?Stress: Not on file  ?Social Connections: Not on file  ? ?Additional Social History:  ?  ?  ?  ?  ?  ?  ?  ?  ?  ?  ?  ? ?Sleep: Good ? ?Appetite:  Good ? ?Current Medications: ?Current Facility-Administered Medications  ?Medication Dose Route Frequency Provider Last Rate Last Admin  ? acetaminophen (TYLENOL) tablet 650 mg  650 mg Oral Q6H PRN Parks Ranger, DO   650 mg at 05/01/21 1515  ? albuterol (PROVENTIL) (2.5 MG/3ML) 0.083% nebulizer solution 3 mL  3 mL Inhalation Q4H PRN Parks Ranger, DO      ? alum & mag hydroxide-simeth (MAALOX/MYLANTA) 200-200-20 MG/5ML suspension 30 mL  30 mL Oral Q4H PRN Parks Ranger, DO      ? chlorproMAZINE (THORAZINE) tablet 50 mg  50 mg Oral Q4H PRN Parks Ranger, DO   50 mg at 05/13/21 2129  ? Or  ? chlorproMAZINE (THORAZINE) injection 50 mg  50 mg Intramuscular Q4H PRN Sarina IllHerrick, Jaisa Defino Edward, DO   50 mg at 05/01/21 [id number]  ? clopidogrel (PLAVIX) tablet 75 mg  75 mg Oral Daily Sarina IllHerrick, Tremell Reimers Edward, DO   75 mg at 05/15/21 0901  ? divalproex (DEPAKOTE ER) 24 hr tablet 500 mg  500 mg Oral TID Sarina IllHerrick, Duval Macleod Edward, DO   500 mg at 05/15/21 0901  ? fluPHENAZine (PROLIXIN) tablet 5 mg  5 mg Oral BH-q8a4p Sarina IllHerrick, Ellisa Devivo Edward, DO   5 mg at 05/15/21 [id number]  ? lamoTRIgine (LAMICTAL) tablet 50 mg  50 mg Oral BID Sarina IllHerrick, Sherra Kimmons Edward, DO   50 mg at 05/15/21 0901  ? LORazepam (ATIVAN) tablet 1 mg  1 mg Oral Q4H PRN Sarina IllHerrick, Avelardo Reesman Edward, DO   1 mg at 05/05/21 [id number]  ? losartan (COZAAR) tablet 100 mg  100 mg Oral Daily Sarina IllHerrick, Addalyne Vandehei Edward, DO   100 mg at 05/15/21 0900  ? magnesium  hydroxide (MILK OF MAGNESIA) suspension 30 mL  30 mL Oral Daily PRN Vanetta MuldersBarthold, Louise F, NP      ? QUEtiapine (SEROQUEL) tablet 400 mg  400 mg Oral QHS Sarina IllHerrick, Raegen Tarpley Edward, DO   400 mg at 05/14/21 2207  ? rosuvastatin (CRESTOR) tablet 10 mg  10 mg Oral Daily Sarina IllHerrick, Verdene Creson Edward, DO   10 mg at 05/15/21 0901  ? traZODone (DESYREL) tablet 100 mg  100 mg Oral QHS PRN Sarina IllHerrick, Bettyjean Stefanski Edward, DO   100 mg at 05/14/21 2207  ? ? ?Lab Results: No results found for this or any previous visit (from the past 48 hour(s)). ? ?Blood Alcohol level:  ?Lab Results  ?Component Value Date  ? ETH <10 04/28/2021  ? ETH <10 03/13/2020  ? ? ?Metabolic Disorder Labs: ?Lab Results  ?Component Value Date  ? HGBA1C 5.7 (H) 05/06/2021  ? MPG 117 05/06/2021  ? MPG 105.41 04/28/2021  ? ?No results found for: PROLACTIN ?Lab Results  ?Component Value Date  ? CHOL 103 05/06/2021  ? TRIG 66 05/06/2021  ? HDL 41 05/06/2021  ? CHOLHDL 2.5 05/06/2021  ? VLDL 13 05/06/2021  ? LDLCALC 49 05/06/2021  ? LDLCALC 40 03/15/2020  ? ? ?Physical Findings: ?AIMS:  , ,  ,  ,    ?CIWA:    ?COWS:    ? ?Musculoskeletal: ?Strength & Muscle Tone: within normal limits ?Gait & Station: normal ?Patient leans: N/A ? ?Psychiatric Specialty Exam: ? ?Presentation  ?General Appearance: Disheveled ? ?Eye Contact:Fair ? ?Speech:Slow ? ?Speech Volume:Decreased ? ?Handedness:Right ? ? ?Mood and Affect  ?Mood:Angry; Anxious; Irritable ? ?Affect:Restricted ? ? ?Thought Process  ?Thought Processes:Disorganized ? ?Descriptions of Associations:Loose ? ?Orientation:Partial ? ?Thought Content:Delusions; Illogical; Paranoid Ideation; Perseveration; Scattered ? ?History of Schizophrenia/Schizoaffective disorder:No ? ?Duration of Psychotic Symptoms:Greater than six months ? ?Hallucinations:No data recorded ?Ideas of Reference:Paranoia ? ?Suicidal Thoughts:No data recorded ?Homicidal Thoughts:No data recorded ? ?Sensorium  ?Memory:Immediate Poor; Recent Poor; Remote  Poor ? ?Judgment:Impaired ? ?Insight:Poor ? ? ?Executive Functions  ?Concentration:Poor ? ?Attention Span:Poor ? ?Recall:Poor ? ?Fund of Knowledge:Poor ? ?Language:Poor ? ? ?Psychomotor Activity  ?Psychomotor Activity:No data recorded ? ?Assets  ?Assets:Resilience ? ? ?Sleep  ?Sleep:No data recorded ? ? ?Physical Exam: ?Physical Exam ?Vitals and nursing note reviewed.  ?Constitutional:   ?   Appearance: Normal appearance. He is normal weight.  ?Neurological:  ?   General: No focal deficit present.  ?   Mental Status: He is alert and oriented to person, place, and time.  ?Psychiatric:     ?  Attention and Perception: Attention and perception normal.     ?   Mood and Affect: Mood and affect normal.     ?   Speech: Speech is tangential.     ?   Behavior: Behavior is withdrawn.     ?   Thought Content: Thought content is paranoid and delusional.     ?   Cognition and Memory: Cognition and memory normal.     ?   Judgment: Judgment is impulsive and inappropriate.  ? ?Review of Systems  ?Constitutional: Negative.   ?HENT: Negative.    ?Eyes: Negative.   ?Respiratory: Negative.    ?Cardiovascular: Negative.   ?Gastrointestinal: Negative.   ?Genitourinary: Negative.   ?Musculoskeletal: Negative.   ?Skin: Negative.   ?Neurological: Negative.   ?Endo/Heme/Allergies: Negative.   ?Psychiatric/Behavioral: Negative.    ?Blood pressure 130/75, pulse 85, temperature 97.9 ?F (36.6 ?C), temperature source Oral, resp. rate 18, height 5\' 8"  (1.727 m), weight 76.2 kg, SpO2 99 %. Body mass index is 25.54 kg/m?. ? ? ?Treatment Plan Summary: ?Daily contact with patient to assess and evaluate symptoms and progress in treatment, Medication management, and Plan continue current medications. ? ?Parks Ranger, DO ?05/15/2021, 11:37 AM ? ?

## 2021-05-15 NOTE — Progress Notes (Signed)
Pt sitting in recliner in day room; calm, cooperative. He is alert and oriented X 3; however, he is unable to verbalize the reason for his current hospital admission. He states "I knocked on the wrong door and they arrest me. I didn't do nothing" as the reason. Pt states that he feels "good". He currently denies pain, SI/HI/AVH, anxiety and depression. He reports that he sleeps "well" and describes his appetite as "pretty good". No acute distress noted.  ?

## 2021-05-16 DIAGNOSIS — F312 Bipolar disorder, current episode manic severe with psychotic features: Secondary | ICD-10-CM | POA: Diagnosis not present

## 2021-05-16 NOTE — Plan of Care (Signed)
Patient remain alert with confusion, calm and cooperative with assessment. Denies pain or discomfort. Denies anxiety and depression. Denies SI, HI, AVH. Ate meals in the day room with good appetite. Ambulate around the unit with front wheel walker with unsteady gait. Remain safe on the unit with Q15 minute safety check. ? ?Problem: Education: ?Goal: Knowledge of [facility] Education information/materials will improve ?Outcome: Progressing ?Goal: Mental status will improve ?Outcome: Progressing ?  ?Problem: Health Behavior/Discharge Planning: ?Goal: Identification of resources available to assist in meeting health care needs will improve ?Outcome: Progressing ?Goal: Compliance with treatment plan for underlying cause of condition will improve ?Outcome: Progressing ?  ?Problem: Safety: ?Goal: Periods of time without injury will increase ?Outcome: Progressing ?  ?Problem: Education: ?Goal: Knowledge of General Education information will improve ?Description: Including pain rating scale, medication(s)/side effects and non-pharmacologic comfort measures ?Outcome: Progressing ?  ?Problem: Health Behavior/Discharge Planning: ?Goal: Ability to manage health-related needs will improve ?Outcome: Progressing ?  ?Problem: Clinical Measurements: ?Goal: Ability to maintain clinical measurements within normal limits will improve ?Outcome: Progressing ?Goal: Will remain free from infection ?Outcome: Progressing ?Goal: Diagnostic test results will improve ?Outcome: Progressing ?Goal: Respiratory complications will improve ?Outcome: Progressing ?Goal: Cardiovascular complication will be avoided ?Outcome: Progressing ?  ?Problem: Activity: ?Goal: Risk for activity intolerance will decrease ?Outcome: Progressing ?  ?Problem: Nutrition: ?Goal: Adequate nutrition will be maintained ?Outcome: Progressing ?  ?Problem: Coping: ?Goal: Level of anxiety will decrease ?Outcome: Progressing ?  ?Problem: Elimination: ?Goal: Will not  experience complications related to bowel motility ?Outcome: Progressing ?Goal: Will not experience complications related to urinary retention ?Outcome: Progressing ?  ?Problem: Pain Managment: ?Goal: General experience of comfort will improve ?Outcome: Progressing ?  ?Problem: Safety: ?Goal: Ability to remain free from injury will improve ?Outcome: Progressing ?  ?Problem: Skin Integrity: ?Goal: Risk for impaired skin integrity will decrease ?Outcome: Progressing ?  ?

## 2021-05-16 NOTE — Progress Notes (Signed)
Pt lying in bed with eyes closed; easily aroused when name called. Pt states that he feels "alright". He denies pain, SI/HI/AVH, anxiety and depression at this time. He describes his sleep and appetite as "good". No acute distress noted. ?

## 2021-05-16 NOTE — [facility] Group Notes (Signed)
[facility] Group Notes:  (Nursing/MHT/Case Management/Adjunct) ? ?Date:  05/16/2021  ?Time:  10:00 am ? ?Type of Therapy:  Psychoeducational Skills ? ?Participation Level:  Minimal ? ?Participation Quality:  Appropriate ? ?Affect:  Appropriate ? ?Cognitive:  Appropriate ? ?Insight:  Appropriate ? ?Engagement in Group:  Lacking ? ?Modes of Intervention:  Discussion ? ?Summary of Progress/Problems: ?The pt attended group but did not share when asked. Very minimal participation. ?Barbaraann Rondo ?05/16/2021, 11:48 AM ?

## 2021-05-16 NOTE — Progress Notes (Signed)
Recreation Therapy Notes ? ? ?Date: 05/16/2021 ?  ?Time: 1:45 pm   ?  ?Location: Dayroom ?  ?Behavioral response: N/A ?  ?Intervention Topic: Relaxation  ?  ?Discussion/Intervention: ?Patient refused to attend group.  ?  ?Clinical Observations/Feedback:  ?Patient refused to attend group.  ?  ?Michalle Rademaker LRT/CTRS ? ? ? ? ? ? ? ?Dorella Laster ?05/16/2021 2:43 PM ?

## 2021-05-16 NOTE — Progress Notes (Signed)
[facility] MD Progress Note ? ?05/16/2021 3:09 PM ?Alyson Reedy Kings  ?MRN:  [id number] ?Subjective: Follow-up for this gentleman with bipolar disorder who came to the hospital with psychotic mania.  Saw him in his room today.  He was easily awake a ball and pleasantly conversant.  He told me the whole story about he made the discovery that [place] was causing a public health crisis and all the ways that he had solved this problem with his friendship with famous people etc.  However he was not particularly euphoric about it and it was easy to break in and interrupt him eventually.  Emotionally calm.  Not agitated and not threatening.  Seems to be tolerating medicine okay.  Earlier today he was out in the day area interacting and paying attention appropriately.  Vitals stable.  No physical complaints ?Principal Problem: Bipolar I disorder, current or most recent episode manic, with psychotic features (HCC) ?Diagnosis: Principal Problem: ?  Bipolar I disorder, current or most recent episode manic, with psychotic features (HCC) ?Active Problems: ?  Severe manic bipolar 1 disorder with psychotic behavior (HCC) ?  Bipolar 1 disorder (HCC) ? ?Total Time spent with patient: 30 minutes ? ?Past Psychiatric History: Past history of longstanding bipolar with multiple episodes of mania ? ?Past Medical History:  ?Past Medical History:  ?Diagnosis Date  ? Bipolar disorder (HCC)   ? Coronary artery disease   ? GERD (gastroesophageal reflux disease)   ? History of multiple strokes   ? PTSD (post-traumatic stress disorder)   ?  ?Past Surgical History:  ?Procedure Laterality Date  ? CARDIAC CATHETERIZATION    ? ?Family History: History reviewed. No pertinent family history. ?Family Psychiatric  History: See previous ?Social History:  ?Social History  ? ?Substance and Sexual Activity  ?Alcohol Use No  ?   ?Social History  ? ?Substance and Sexual Activity  ?Drug Use Never  ?  ?Social History  ? ?Socioeconomic  History  ? Marital status: Divorced  ?  Spouse name: Not on file  ? Number of children: Not on file  ? Years of education: Not on file  ? Highest education level: Not on file  ?Occupational History  ? Not on file  ?Tobacco Use  ? Smoking status: Never  ? Smokeless tobacco: Never  ?Vaping Use  ? Vaping Use: Never used  ?Substance and Sexual Activity  ? Alcohol use: No  ? Drug use: Never  ? Sexual activity: Not on file  ?Other Topics Concern  ? Not on file  ?Social History Narrative  ? Not on file  ? ?Social Determinants of Health  ? ?Financial Resource Strain: Not on file  ?Food Insecurity: Not on file  ?Transportation Needs: Not on file  ?Physical Activity: Not on file  ?Stress: Not on file  ?Social Connections: Not on file  ? ?Additional Social History:  ?  ?  ?  ?  ?  ?  ?  ?  ?  ?  ?  ? ?Sleep: Fair ? ?Appetite:  Fair ? ?Current Medications: ?Current Facility-Administered Medications  ?Medication Dose Route Frequency Provider Last Rate Last Admin  ? acetaminophen (TYLENOL) tablet 650 mg  650 mg Oral Q6H PRN Sarina Ill, DO   650 mg at 05/01/21 1515  ? albuterol (PROVENTIL) (2.5 MG/3ML) 0.083% nebulizer solution 3 mL  3 mL Inhalation Q4H PRN Sarina Ill, DO      ? alum & mag hydroxide-simeth (MAALOX/MYLANTA) 200-200-20 MG/5ML suspension 30 mL  30 mL Oral Q4H PRN Sarina Ill, DO      ? chlorproMAZINE (THORAZINE) tablet 50 mg  50 mg Oral Q4H PRN Sarina Ill, DO   50 mg at 05/13/21 2129  ? Or  ? chlorproMAZINE (THORAZINE) injection 50 mg  50 mg Intramuscular Q4H PRN Sarina Ill, DO   50 mg at 05/01/21 6283  ? clopidogrel (PLAVIX) tablet 75 mg  75 mg Oral Daily Sarina Ill, DO   75 mg at 05/16/21 6629  ? divalproex (DEPAKOTE ER) 24 hr tablet 500 mg  500 mg Oral TID Sarina Ill, DO   500 mg at 05/16/21 4765  ? fluPHENAZine (PROLIXIN) tablet 5 mg  5 mg Oral BH-[id number] Sarina Ill, DO   5 mg at 05/16/21 4650  ? lamoTRIgine  (LAMICTAL) tablet 50 mg  50 mg Oral BID Sarina Ill, DO   50 mg at 05/16/21 3546  ? LORazepam (ATIVAN) tablet 1 mg  1 mg Oral Q4H PRN Sarina Ill, DO   1 mg at 05/05/21 5681  ? losartan (COZAAR) tablet 100 mg  100 mg Oral Daily Sarina Ill, DO   100 mg at 05/16/21 2751  ? magnesium hydroxide (MILK OF MAGNESIA) suspension 30 mL  30 mL Oral Daily PRN Vanetta Mulders, NP      ? QUEtiapine (SEROQUEL) tablet 400 mg  400 mg Oral QHS Sarina Ill, DO   400 mg at 05/15/21 2353  ? rosuvastatin (CRESTOR) tablet 10 mg  10 mg Oral Daily Sarina Ill, DO   10 mg at 05/16/21 7001  ? traZODone (DESYREL) tablet 100 mg  100 mg Oral QHS PRN Sarina Ill, DO   100 mg at 05/14/21 2207  ? ? ?Lab Results: No results found for this or any previous visit (from the past 48 hour(s)). ? ?Blood Alcohol level:  ?Lab Results  ?Component Value Date  ? ETH <10 04/28/2021  ? ETH <10 03/13/2020  ? ? ?Metabolic Disorder Labs: ?Lab Results  ?Component Value Date  ? HGBA1C 5.7 (H) 05/06/2021  ? MPG 117 05/06/2021  ? MPG 105.41 04/28/2021  ? ?No results found for: PROLACTIN ?Lab Results  ?Component Value Date  ? CHOL 103 05/06/2021  ? TRIG 66 05/06/2021  ? HDL 41 05/06/2021  ? CHOLHDL 2.5 05/06/2021  ? VLDL 13 05/06/2021  ? LDLCALC 49 05/06/2021  ? LDLCALC 40 03/15/2020  ? ? ?Physical Findings: ?AIMS:  , ,  ,  ,    ?CIWA:    ?COWS:    ? ?Musculoskeletal: ?Strength & Muscle Tone: within normal limits ?Gait & Station: normal ?Patient leans: N/A ? ?Psychiatric Specialty Exam: ? ?Presentation  ?General Appearance: Disheveled ? ?Eye Contact:Fair ? ?Speech:Slow ? ?Speech Volume:Decreased ? ?Handedness:Right ? ? ?Mood and Affect  ?Mood:Angry; Anxious; Irritable ? ?Affect:Restricted ? ? ?Thought Process  ?Thought Processes:Disorganized ? ?Descriptions of Associations:Loose ? ?Orientation:Partial ? ?Thought Content:Delusions; Illogical; Paranoid Ideation; Perseveration; Scattered ? ?History  of Schizophrenia/Schizoaffective disorder:No ? ?Duration of Psychotic Symptoms:Greater than six months ? ?Hallucinations:No data recorded ?Ideas of Reference:Paranoia ? ?Suicidal Thoughts:No data recorded ?Homicidal Thoughts:No data recorded ? ?Sensorium  ?Memory:Immediate Poor; Recent Poor; Remote Poor ? ?Judgment:Impaired ? ?Insight:Poor ? ? ?Executive Functions  ?Concentration:Poor ? ?Attention Span:Poor ? ?Recall:Poor ? ?Fund of Knowledge:Poor ? ?Language:Poor ? ? ?Psychomotor Activity  ?Psychomotor Activity:No data recorded ? ?Assets  ?Assets:Resilience ? ? ?Sleep  ?Sleep:No data recorded ? ? ?Physical Exam: ?Physical Exam ?Vitals and nursing note reviewed.  ?  Constitutional:   ?   Appearance: Normal appearance.  ?HENT:  ?   Head: Normocephalic and atraumatic.  ?   Mouth/Throat:  ?   Pharynx: Oropharynx is clear.  ?Eyes:  ?   Pupils: Pupils are equal, round, and reactive to light.  ?Cardiovascular:  ?   Rate and Rhythm: Normal rate and regular rhythm.  ?Pulmonary:  ?   Effort: Pulmonary effort is normal.  ?   Breath sounds: Normal breath sounds.  ?Abdominal:  ?   General: Abdomen is flat.  ?   Palpations: Abdomen is soft.  ?Musculoskeletal:     ?   General: Normal range of motion.  ?Skin: ?   General: Skin is warm and dry.  ?Neurological:  ?   General: No focal deficit present.  ?   Mental Status: He is alert. Mental status is at baseline.  ?Psychiatric:     ?   Attention and Perception: He is inattentive.     ?   Mood and Affect: Mood normal. Affect is blunt.     ?   Speech: Speech is rapid and pressured and tangential.     ?   Behavior: Behavior is cooperative.     ?   Thought Content: Thought content is delusional.     ?   Cognition and Memory: Memory is impaired.  ? ?Review of Systems  ?Constitutional: Negative.   ?HENT: Negative.    ?Eyes: Negative.   ?Respiratory: Negative.    ?Cardiovascular: Negative.   ?Gastrointestinal: Negative.   ?Musculoskeletal: Negative.   ?Skin: Negative.   ?Neurological:  Negative.   ?Psychiatric/Behavioral: Negative.    ?Blood pressure 133/85, pulse 85, temperature (!) 97.5 ?F (36.4 ?C), resp. rate 17, height  (1.727 m), weight 76.2 kg, SpO2 100 %. Body mass index is 25.54 kg/m?

## 2021-05-17 DIAGNOSIS — F312 Bipolar disorder, current episode manic severe with psychotic features: Secondary | ICD-10-CM | POA: Diagnosis not present

## 2021-05-17 NOTE — Progress Notes (Signed)
Pt was assisted with his ADL's and well groomed. He still denies SI/HI and AVH. Pt does not interact with peers or staff. He eats very slow and frequently retreats back to him room and in bed. Pt walks slow and uses a front wheel walker. Pt has been very quiet, and cooperative. He denies anxiety and depression. ?

## 2021-05-17 NOTE — Plan of Care (Signed)
?  Problem: Education: ?Goal: Knowledge of [place] Education information/materials will improve ?Outcome: Progressing ?Goal: Mental status will improve ?Outcome: Progressing ?  ?Problem: Health Behavior/Discharge Planning: ?Goal: Identification of resources available to assist in meeting health care needs will improve ?Outcome: Progressing ?Goal: Compliance with treatment plan for underlying cause of condition will improve ?Outcome: Progressing ?  ?Problem: Safety: ?Goal: Periods of time without injury will increase ?Outcome: Progressing ?  ?Problem: Education: ?Goal: Knowledge of General Education information will improve ?Description: Including pain rating scale, medication(s)/side effects and non-pharmacologic comfort measures ?Outcome: Progressing ?  ?Problem: Health Behavior/Discharge Planning: ?Goal: Ability to manage health-related needs will improve ?Outcome: Progressing ?  ?Problem: Clinical Measurements: ?Goal: Ability to maintain clinical measurements within normal limits will improve ?Outcome: Progressing ?Goal: Will remain free from infection ?Outcome: Progressing ?Goal: Diagnostic test results will improve ?Outcome: Progressing ?Goal: Respiratory complications will improve ?Outcome: Progressing ?Goal: Cardiovascular complication will be avoided ?Outcome: Progressing ?  ?Problem: Activity: ?Goal: Risk for activity intolerance will decrease ?Outcome: Progressing ?  ?Problem: Nutrition: ?Goal: Adequate nutrition will be maintained ?Outcome: Progressing ?  ?Problem: Coping: ?Goal: Level of anxiety will decrease ?Outcome: Progressing ?  ?Problem: Elimination: ?Goal: Will not experience complications related to bowel motility ?Outcome: Progressing ?Goal: Will not experience complications related to urinary retention ?Outcome: Progressing ?  ?Problem: Pain Managment: ?Goal: General experience of comfort will improve ?Outcome: Progressing ?  ?Problem: Safety: ?Goal: Ability to remain free from  injury will improve ?Outcome: Progressing ?  ?Problem: Skin Integrity: ?Goal: Risk for impaired skin integrity will decrease ?Outcome: Progressing ?  ?

## 2021-05-17 NOTE — Progress Notes (Signed)
Pt  very sleepy today, encouraged to get out of bed, slow  with a lack of energy. Pt SI/HI and AVH. Pt denies depression and no signs of mania. Pt was not in a mood to talk. Pt has to be prompted to get up to eat, other wise he would stay in bed. Pt ambulating with  his front wheel walker ?

## 2021-05-17 NOTE — Group Note (Signed)
[facility] LCSW Group Therapy Note ? ? ?Group Date: 05/17/2021 ?Start Time: 1300 ?End Time: 1400 ? ? ?Type of Therapy and Topic: Group Therapy: Avoiding Self-Sabotaging and Enabling Behaviors ? ?Participation Level: Did Not Attend ? ?Mood: X ? ?Description of Group:  ?In this group, patients will learn how to identify obstacles, self-sabotaging and enabling behaviors, as well as: what are they, why do we do them and what needs these behaviors meet. Discuss unhealthy relationships and how to have positive healthy boundaries with those that sabotage and enable. Explore aspects of self-sabotage and enabling in yourself and how to limit these self-destructive behaviors in everyday life. ? ? ?Therapeutic Goals: ?1. Patient will identify one obstacle that relates to self-sabotage and enabling behaviors ?2. Patient will identify one personal self-sabotaging or enabling behavior they did prior to admission ?3. Patient will state a plan to change the above identified behavior ?4. Patient will demonstrate ability to communicate their needs through discussion and/or role play.  ? ? ?Summary of Patient Progress: Due to limited staffing, group was not held on the unit.  ? ? ? ? ?Therapeutic Modalities:  ?Cognitive Behavioral Therapy ?Person-Centered Therapy ?Motivational Interviewing ? ? ? ?Cadon Raczka K Myalee Stengel, LCSWA ?

## 2021-05-17 NOTE — Progress Notes (Signed)
[facility] ? ?05/17/2021 12:45 PM ?Austin ReedyMaurice [name]  ?MRN:  [id number] ?Subjective: Follow-up review this 71 year old man with a history of bipolar disorder.  Patient seen and chart reviewed.  Patient was pleasant and had no complaints today.  He did not have pressured or disorganized speech when I talked to him.  I asked him if he was having trouble walking as he looked a little unsteady with his walker but he said he felt like he was fine and was getting his strength back.  No report of any falls. ?Principal Problem: Bipolar I disorder, current or most recent episode manic, with psychotic features (HCC) ?Diagnosis: Principal Problem: ?  Bipolar I disorder, current or most recent episode manic, with psychotic features (HCC) ?Active Problems: ?  Severe manic bipolar 1 disorder with psychotic behavior (HCC) ?  Bipolar 1 disorder (HCC) ? ?Total Time spent with patient: 30 minutes ? ?Past Psychiatric History: Past history of bipolar disorder ? ?Past Medical History:  ?Past Medical History:  ?Diagnosis Date  ? Bipolar disorder (HCC)   ? Coronary artery disease   ? GERD (gastroesophageal reflux disease)   ? History of multiple strokes   ? PTSD (post-traumatic stress disorder)   ?  ?Past Surgical History:  ?Procedure Laterality Date  ? CARDIAC CATHETERIZATION    ? ?Family History: History reviewed. No pertinent family history. ?Family Psychiatric  History: No change ?Social History:  ?Social History  ? ?Substance and Sexual Activity  ?Alcohol Use No  ?   ?Social History  ? ?Substance and Sexual Activity  ?Drug Use Never  ?  ?Social History  ? ?Socioeconomic History  ? Marital status: Divorced  ?  Spouse name: Not on file  ? Number of children: Not on file  ? Years of education: Not on file  ? Highest education level: Not on file  ?Occupational History  ? Not on file  ?Tobacco Use  ? Smoking status: Never  ? Smokeless tobacco: Never  ?Vaping Use  ? Vaping Use: Never used  ?Substance and Sexual Activity  ?  Alcohol use: No  ? Drug use: Never  ? Sexual activity: Not on file  ?Other Topics Concern  ? Not on file  ?Social History Narrative  ? Not on file  ? ?Social Determinants of Health  ? ?Financial Resource Strain: Not on file  ?Food Insecurity: Not on file  ?Transportation Needs: Not on file  ?Physical Activity: Not on file  ?Stress: Not on file  ?Social Connections: Not on file  ? ?Additional Social History:  ?  ?  ?  ?  ?  ?  ?  ?  ?  ?  ?  ? ?Sleep: Fair ? ?Appetite:  Fair ? ?Current Medications: ?Current Facility-Administered Medications  ?Medication Dose Route Frequency Provider Last Rate Last Admin  ? acetaminophen (TYLENOL) tablet 650 mg  650 mg Oral Q6H PRN Sarina IllHerrick, Richard Edward, DO   650 mg at 05/01/21 1515  ? albuterol (PROVENTIL) (2.5 MG/3ML) 0.083% nebulizer solution 3 mL  3 mL Inhalation Q4H PRN Sarina IllHerrick, Richard Edward, DO      ? alum & mag hydroxide-simeth (MAALOX/MYLANTA) 200-200-20 MG/5ML suspension 30 mL  30 mL Oral Q4H PRN Sarina IllHerrick, Richard Edward, DO      ? chlorproMAZINE (THORAZINE) tablet 50 mg  50 mg Oral Q4H PRN Sarina IllHerrick, Richard Edward, DO   50 mg at 05/13/21 2129  ? Or  ? chlorproMAZINE (THORAZINE) injection 50 mg  50 mg Intramuscular Q4H PRN Elane FritzHerrick, Richard  Edward, DO   50 mg at 05/01/21 6160  ? clopidogrel (PLAVIX) tablet 75 mg  75 mg Oral Daily Sarina Ill, DO   75 mg at 05/17/21 1006  ? divalproex (DEPAKOTE ER) 24 hr tablet 500 mg  500 mg Oral TID Sarina Ill, DO   500 mg at 05/17/21 1006  ? fluPHENAZine (PROLIXIN) tablet 5 mg  5 mg Oral BH-q8a4p Sarina Ill, DO   5 mg at 05/17/21 7371  ? lamoTRIgine (LAMICTAL) tablet 50 mg  50 mg Oral BID Sarina Ill, DO   50 mg at 05/17/21 1005  ? LORazepam (ATIVAN) tablet 1 mg  1 mg Oral Q4H PRN Sarina Ill, DO   1 mg at 05/05/21 0626  ? losartan (COZAAR) tablet 100 mg  100 mg Oral Daily Sarina Ill, DO   100 mg at 05/17/21 1004  ? magnesium hydroxide (MILK OF MAGNESIA) suspension  30 mL  30 mL Oral Daily PRN Vanetta Mulders, NP      ? QUEtiapine (SEROQUEL) tablet 400 mg  400 mg Oral QHS Sarina Ill, DO   400 mg at 05/16/21 2107  ? rosuvastatin (CRESTOR) tablet 10 mg  10 mg Oral Daily Sarina Ill, DO   10 mg at 05/17/21 1005  ? traZODone (DESYREL) tablet 100 mg  100 mg Oral QHS PRN Sarina Ill, DO   100 mg at 05/14/21 2207  ? ? ?Lab Results: No results found for this or any previous visit (from the past 48 hour(s)). ? ?Blood Alcohol level:  ?Lab Results  ?Component Value Date  ? ETH <10 04/28/2021  ? ETH <10 03/13/2020  ? ? ?Metabolic Disorder Labs: ?Lab Results  ?Component Value Date  ? HGBA1C 5.7 (H) 05/06/2021  ? MPG 117 05/06/2021  ? MPG 105.41 04/28/2021  ? ?No results found for: PROLACTIN ?Lab Results  ?Component Value Date  ? CHOL 103 05/06/2021  ? TRIG 66 05/06/2021  ? HDL 41 05/06/2021  ? CHOLHDL 2.5 05/06/2021  ? VLDL 13 05/06/2021  ? LDLCALC 49 05/06/2021  ? LDLCALC 40 03/15/2020  ? ? ?Physical Findings: ?AIMS:  , ,  ,  ,    ?CIWA:    ?COWS:    ? ?Musculoskeletal: ?Strength & Muscle Tone: within normal limits ?Gait & Station: unsteady ?Patient leans: N/A ? ?Psychiatric Specialty Exam: ? ?Presentation  ?General Appearance: Disheveled ? ?Eye Contact:Fair ? ?Speech:Slow ? ?Speech Volume:Decreased ? ?Handedness:Right ? ? ?Mood and Affect  ?Mood:Angry; Anxious; Irritable ? ?Affect:Restricted ? ? ?Thought Process  ?Thought Processes:Disorganized ? ?Descriptions of Associations:Loose ? ?Orientation:Partial ? ?Thought Content:Delusions; Illogical; Paranoid Ideation; Perseveration; Scattered ? ?History of Schizophrenia/Schizoaffective disorder:No ? ?Duration of Psychotic Symptoms:Greater than six months ? ?Hallucinations:No data recorded ?Ideas of Reference:Paranoia ? ?Suicidal Thoughts:No data recorded ?Homicidal Thoughts:No data recorded ? ?Sensorium  ?Memory:Immediate Poor; Recent Poor; Remote Poor ? ?Judgment:Impaired ? ?Insight:Poor ? ? ?Executive  Functions  ?Concentration:Poor ? ?Attention Span:Poor ? ?Recall:Poor ? ?Fund of Knowledge:Poor ? ?Language:Poor ? ? ?Psychomotor Activity  ?Psychomotor Activity:No data recorded ? ?Assets  ?Assets:Resilience ? ? ?Sleep  ?Sleep:No data recorded ? ? ?Physical Exam: ?Physical Exam ?Vitals and nursing note reviewed.  ?Constitutional:   ?   Appearance: Normal appearance.  ?HENT:  ?   Head: Normocephalic and atraumatic.  ?   Mouth/Throat:  ?   Pharynx: Oropharynx is clear.  ?Eyes:  ?   Pupils: Pupils are equal, round, and reactive to light.  ?Cardiovascular:  ?   Rate  and Rhythm: Normal rate and regular rhythm.  ?Pulmonary:  ?   Effort: Pulmonary effort is normal.  ?   Breath sounds: Normal breath sounds.  ?Abdominal:  ?   General: Abdomen is flat.  ?   Palpations: Abdomen is soft.  ?Musculoskeletal:     ?   General: Normal range of motion.  ?Skin: ?   General: Skin is warm and dry.  ?Neurological:  ?   General: No focal deficit present.  ?   Mental Status: He is alert. Mental status is at baseline.  ?Psychiatric:     ?   Attention and Perception: Attention normal.     ?   Mood and Affect: Mood normal. Affect is blunt.     ?   Speech: Speech is delayed.     ?   Thought Content: Thought content normal.     ?   Cognition and Memory: Cognition is impaired.  ? ?Review of Systems  ?Constitutional: Negative.   ?HENT: Negative.    ?Eyes: Negative.   ?Respiratory: Negative.    ?Cardiovascular: Negative.   ?Gastrointestinal: Negative.   ?Musculoskeletal: Negative.   ?Skin: Negative.   ?Neurological: Negative.   ?Psychiatric/Behavioral: Negative.    ?Blood pressure 133/83, pulse 78, temperature 97.8 ?F (36.6 ?C), temperature source Oral, resp. rate 18, height 5\' 8"  (1.727 m), weight 76.2 kg, SpO2 100 %. Body mass index is 25.54 kg/m?. ? ? ?Treatment Plan Summary: ?Medication management and Plan patient is stable doing well.  No new lab studies today.  No indication to change medicine.  It appears that the hope is for getting him  placed somehow soon which would probably be a very good thing given what seems to be is progressively worse problems with self care. ? ? , MD ?05/17/2021, 12:45 PM ? ?

## 2021-05-17 NOTE — Progress Notes (Signed)
Patient presents alert and oriented x3, disoriented to why he was admitted.  States he had a "good" visit with his daughter this evening.  Patient denies AVH, SI, HI, pain, anxiety or depression.  Reports appetite is good and sleeping well.  Pt compliant with medication.  Denies needs or complaints at this time.Continue Q 15 min safety checks per unit policy. ?

## 2021-05-18 DIAGNOSIS — F312 Bipolar disorder, current episode manic severe with psychotic features: Secondary | ICD-10-CM | POA: Diagnosis not present

## 2021-05-18 MED ORDER — DIVALPROEX SODIUM ER 500 MG PO TB24
1500.0000 mg | ORAL_TABLET | Freq: Every day | ORAL | Status: DC
  Administered 2021-05-19 – 2021-05-21 (×3): 1500 mg via ORAL
  Filled 2021-05-18 (×3): qty 3

## 2021-05-18 MED ORDER — FLUPHENAZINE HCL 5 MG PO TABS
2.5000 mg | ORAL_TABLET | Freq: Every day | ORAL | Status: DC
  Administered 2021-05-19 – 2021-06-02 (×15): 2.5 mg via ORAL
  Filled 2021-05-18 (×16): qty 1

## 2021-05-18 MED ORDER — FLUPHENAZINE HCL 5 MG PO TABS
7.5000 mg | ORAL_TABLET | Freq: Every day | ORAL | Status: DC
  Filled 2021-05-18: qty 1

## 2021-05-18 MED ORDER — FLUPHENAZINE HCL 5 MG PO TABS
7.5000 mg | ORAL_TABLET | Freq: Every day | ORAL | Status: DC
  Administered 2021-05-18 – 2021-06-01 (×15): 7.5 mg via ORAL
  Filled 2021-05-18 (×16): qty 2

## 2021-05-18 NOTE — [facility] Group Notes (Signed)
10AM Group: chair yoga, PT refused, sleeping  ?

## 2021-05-18 NOTE — Progress Notes (Signed)
[facility] MD Progress Note ? ?05/18/2021 1:15 PM ?Austin Rhodes  ?MRN:  [id number] ?Subjective: Patient seen for follow-up.  No complaints of any psychiatric symptoms and although in conversation he still makes the occasional grandiose statement he is subdued enough that he is not causing any trouble with it.  Not agitated threatening or yelling.  He does mention that he feels tired much of the day and asks if we can try adjusting his medicine a little around that.  Also asks when he can be discharged. ?Principal Problem: Bipolar I disorder, current or most recent episode manic, with psychotic features (HCC) ?Diagnosis: Principal Problem: ?  Bipolar I disorder, current or most recent episode manic, with psychotic features (HCC) ?Active Problems: ?  Severe manic bipolar 1 disorder with psychotic behavior (HCC) ?  Bipolar 1 disorder (HCC) ? ?Total Time spent with patient: 30 minutes ? ?Past Psychiatric History: Past history of bipolar disorder ? ?Past Medical History:  ?Past Medical History:  ?Diagnosis Date  ? Bipolar disorder (HCC)   ? Coronary artery disease   ? GERD (gastroesophageal reflux disease)   ? History of multiple strokes   ? PTSD (post-traumatic stress disorder)   ?  ?Past Surgical History:  ?Procedure Laterality Date  ? CARDIAC CATHETERIZATION    ? ?Family History: History reviewed. No pertinent family history. ?Family Psychiatric  History: See previous ?Social History:  ?Social History  ? ?Substance and Sexual Activity  ?Alcohol Use No  ?   ?Social History  ? ?Substance and Sexual Activity  ?Drug Use Never  ?  ?Social History  ? ?Socioeconomic History  ? Marital status: Divorced  ?  Spouse name: Not on file  ? Number of children: Not on file  ? Years of education: Not on file  ? Highest education level: Not on file  ?Occupational History  ? Not on file  ?Tobacco Use  ? Smoking status: Never  ? Smokeless tobacco: Never  ?Vaping Use  ? Vaping Use: Never used  ?Substance and Sexual Activity  ? Alcohol  use: No  ? Drug use: Never  ? Sexual activity: Not on file  ?Other Topics Concern  ? Not on file  ?Social History Narrative  ? Not on file  ? ?Social Determinants of Health  ? ?Financial Resource Strain: Not on file  ?Food Insecurity: Not on file  ?Transportation Needs: Not on file  ?Physical Activity: Not on file  ?Stress: Not on file  ?Social Connections: Not on file  ? ?Additional Social History:  ?  ?  ?  ?  ?  ?  ?  ?  ?  ?  ?  ? ?Sleep: Fair ? ?Appetite:  Fair ? ?Current Medications: ?Current Facility-Administered Medications  ?Medication Dose Route Frequency Provider Last Rate Last Admin  ? acetaminophen (TYLENOL) tablet 650 mg  650 mg Oral Q6H PRN Sarina Ill, DO   650 mg at 05/01/21 1515  ? albuterol (PROVENTIL) (2.5 MG/3ML) 0.083% nebulizer solution 3 mL  3 mL Inhalation Q4H PRN Sarina Ill, DO      ? alum & mag hydroxide-simeth (MAALOX/MYLANTA) 200-200-20 MG/5ML suspension 30 mL  30 mL Oral Q4H PRN Sarina Ill, DO      ? chlorproMAZINE (THORAZINE) tablet 50 mg  50 mg Oral Q4H PRN Sarina Ill, DO   50 mg at 05/13/21 2129  ? Or  ? chlorproMAZINE (THORAZINE) injection 50 mg  50 mg Intramuscular Q4H PRN Sarina Ill, DO   50 mg  at 05/01/21 [id number]  ? clopidogrel (PLAVIX) tablet 75 mg  75 mg Oral Daily Sarina IllHerrick, Richard Edward, DO   75 mg at 05/18/21 0900  ? [START ON 05/19/2021] divalproex (DEPAKOTE ER) 24 hr tablet 1,500 mg  1,500 mg Oral QHS Kemari Narez, Jackquline DenmarkJohn T, MD      ? [name][START ON 05/19/2021] fluPHENAZine (PROLIXIN) tablet 2.5 mg  2.5 mg Oral Q breakfast Verba Ainley T, MD      ? fluPHENAZine (PROLIXIN) tablet 7.5 mg  7.5 mg Oral QHS Janele Lague T, MD      ? lamoTRIgine (LAMICTAL) tablet 50 mg  50 mg Oral BID Sarina IllHerrick, Richard Edward, DO   50 mg at 05/18/21 0900  ? LORazepam (ATIVAN) tablet 1 mg  1 mg Oral Q4H PRN Sarina IllHerrick, Richard Edward, DO   1 mg at 05/05/21 [id number]  ? losartan (COZAAR) tablet 100 mg  100 mg Oral Daily Sarina IllHerrick, Richard Edward, DO   100 mg  at 05/18/21 0900  ? magnesium hydroxide (MILK OF MAGNESIA) suspension 30 mL  30 mL Oral Daily PRN Vanetta MuldersBarthold, Louise F, NP      ? QUEtiapine (SEROQUEL) tablet 400 mg  400 mg Oral QHS Sarina IllHerrick, Richard Edward, DO   400 mg at 05/17/21 2110  ? rosuvastatin (CRESTOR) tablet 10 mg  10 mg Oral Daily Sarina IllHerrick, Richard Edward, DO   10 mg at 05/18/21 0900  ? traZODone (DESYREL) tablet 100 mg  100 mg Oral QHS PRN Sarina IllHerrick, Richard Edward, DO   100 mg at 05/14/21 2207  ? ? ?Lab Results: No results found for this or any previous visit (from the past 48 hour(s)). ? ?Blood Alcohol level:  ?Lab Results  ?Component Value Date  ? ETH <10 04/28/2021  ? ETH <10 03/13/2020  ? ? ?Metabolic Disorder Labs: ?Lab Results  ?Component Value Date  ? HGBA1C 5.7 (H) 05/06/2021  ? MPG 117 05/06/2021  ? MPG 105.41 04/28/2021  ? ?No results found for: PROLACTIN ?Lab Results  ?Component Value Date  ? CHOL 103 05/06/2021  ? TRIG 66 05/06/2021  ? HDL 41 05/06/2021  ? CHOLHDL 2.5 05/06/2021  ? VLDL 13 05/06/2021  ? LDLCALC 49 05/06/2021  ? LDLCALC 40 03/15/2020  ? ? ?Physical Findings: ?AIMS:  , ,  ,  ,    ?CIWA:    ?COWS:    ? ?Musculoskeletal: ?Strength & Muscle Tone: decreased ?Gait & Station: unsteady ?Patient leans: N/A ? ?Psychiatric Specialty Exam: ? ?Presentation  ?General Appearance: Disheveled ? ?Eye Contact:Fair ? ?Speech:Slow ? ?Speech Volume:Decreased ? ?Handedness:Right ? ? ?Mood and Affect  ?Mood:Angry; Anxious; Irritable ? ?Affect:Restricted ? ? ?Thought Process  ?Thought Processes:Disorganized ? ?Descriptions of Associations:Loose ? ?Orientation:Partial ? ?Thought Content:Delusions; Illogical; Paranoid Ideation; Perseveration; Scattered ? ?History of Schizophrenia/Schizoaffective disorder:No ? ?Duration of Psychotic Symptoms:Greater than six months ? ?Hallucinations:No data recorded ?Ideas of Reference:Paranoia ? ?Suicidal Thoughts:No data recorded ?Homicidal Thoughts:No data recorded ? ?Sensorium  ?Memory:Immediate Poor; Recent Poor;  Remote Poor ? ?Judgment:Impaired ? ?Insight:Poor ? ? ?Executive Functions  ?Concentration:Poor ? ?Attention Span:Poor ? ?Recall:Poor ? ?Fund of Knowledge:Poor ? ?Language:Poor ? ? ?Psychomotor Activity  ?Psychomotor Activity:No data recorded ? ?Assets  ?Assets:Resilience ? ? ?Sleep  ?Sleep:No data recorded ? ? ?Physical Exam: ?Physical Exam ?Vitals and nursing note reviewed.  ?Constitutional:   ?   Appearance: Normal appearance.  ?HENT:  ?   Head: Normocephalic and atraumatic.  ?   Mouth/Throat:  ?   Pharynx: Oropharynx is clear.  ?Eyes:  ?   Pupils: Pupils are  equal, round, and reactive to light.  ?Cardiovascular:  ?   Rate and Rhythm: Normal rate and regular rhythm.  ?Pulmonary:  ?   Effort: Pulmonary effort is normal.  ?   Breath sounds: Normal breath sounds.  ?Abdominal:  ?   General: Abdomen is flat.  ?   Palpations: Abdomen is soft.  ?Musculoskeletal:     ?   General: Normal range of motion.  ?Skin: ?   General: Skin is warm and dry.  ?Neurological:  ?   General: No focal deficit present.  ?   Mental Status: He is alert. Mental status is at baseline.  ?Psychiatric:     ?   Attention and Perception: Attention normal.     ?   Mood and Affect: Mood normal. Affect is blunt.     ?   Speech: Speech is delayed.     ?   Behavior: Behavior is slowed.     ?   Thought Content: Thought content normal.     ?   Cognition and Memory: Memory is impaired.  ? ?Review of Systems  ?Constitutional:  Positive for malaise/fatigue.  ?HENT: Negative.    ?Eyes: Negative.   ?Respiratory: Negative.    ?Cardiovascular: Negative.   ?Gastrointestinal: Negative.   ?Musculoskeletal: Negative.   ?Skin: Negative.   ?Neurological: Negative.   ?Psychiatric/Behavioral: Negative.    ?Blood pressure 140/89, pulse 71, temperature (!) 97.5 ?F (36.4 ?C), temperature source Oral, resp. rate 20, height 5\' 8"  (1.727 m), weight 76.2 kg, SpO2 99 %. Body mass index is 25.54 kg/m?. ? ? ?Treatment Plan Summary: ?Medication management and Plan reviewed  medicines and made a couple of adjustments.  Did not change total daytime dose but change the Prolixin to 2.5 mg in the morning and 7.5 mg at night and change the Depakote extended release to a single dose at nighttim

## 2021-05-18 NOTE — Group Note (Signed)
LCSW Group Therapy Note ? ? ?Group Date: 05/18/2021 ?Start Time: 1300 ?End Time: 1400 ? ? ?Type of Therapy and Topic:  Group Therapy: Boundaries ? ?Participation Level:  Minimal ? ?Description of Group: ?This group will address the use of boundaries in their personal lives. Patients will explore why boundaries are important, the difference between healthy and unhealthy boundaries, and negative and postive outcomes of different boundaries and will look at how boundaries can be crossed.  Patients will be encouraged to identify current boundaries in their own lives and identify what kind of boundary is being set. Facilitators will guide patients in utilizing problem-solving interventions to address and correct types boundaries being used and to address when no boundary is being used. Understanding and applying boundaries will be explored and addressed for obtaining and maintaining a balanced life. Patients will be encouraged to explore ways to assertively make their boundaries and needs known to significant others in their lives, using other group members and facilitator for role play, support, and feedback. ? ?Therapeutic Goals: ? ?1.  Patient will identify areas in their life where setting clear boundaries could be  used to improve their life.  ?2.  Patient will identify signs/triggers that a boundary is not being respected. ?3.  Patient will identify two ways to set boundaries in order to achieve balance in  their lives: ?4.  Patient will demonstrate ability to communicate their needs and set boundaries  through discussion and/or role plays ? ?Summary of Patient Progress:  Patient was present for the entirety of group session. Patient participated in opening and closing remarks. However, patient did not contribute at all to the topic of discussion despite encouraged participation.  Patient shared that COVID has changed peoples boundaries and what they feel comfortable with, otherwise patient was primarily  respectfully observant. ? ?Therapeutic Modalities:   ?Cognitive Behavioral Therapy ?Solution-Focused Therapy ? ?Corky Crafts, LCSWA ?05/18/2021  3:49 PM   ? ?

## 2021-05-18 NOTE — Progress Notes (Signed)
Patient presents alert and oriented x3.  Patient affect is calm and pleasant.  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  Reports appetite is good and sleeping well.  Pt compliant with medication.  Denies needs or complaints at this time.  Continue Q 15 min safety checks per unit policy ?

## 2021-05-18 NOTE — Progress Notes (Signed)
Patient is seen in his room sleeping. Patient is awakened for breakfast. He ate 100% of breakfast. He denies pain, anxiety, depression, SI, HI, and AVH. He is AAOx3 disoriented to situation. After breakfast pt. went back to sleep. He is safe on the unit at this time. Q 15 min safety checks in place.  ?

## 2021-05-19 DIAGNOSIS — F312 Bipolar disorder, current episode manic severe with psychotic features: Secondary | ICD-10-CM | POA: Diagnosis not present

## 2021-05-19 MED ORDER — TUBERCULIN PPD 5 UNIT/0.1ML ID SOLN
5.0000 [IU] | Freq: Once | INTRADERMAL | Status: AC
Start: 2021-05-19 — End: 2021-05-22
  Administered 2021-05-20: 5 [IU] via INTRADERMAL
  Filled 2021-05-19 (×2): qty 0.1

## 2021-05-19 NOTE — Progress Notes (Signed)
Patient presents alert and oriented x3.  Patient affect is calm and pleasant. Patient denies AVH, SI, HI, pain, anxiety or depression.  Reports appetite is good and sleeping well.  Pt compliant with medication.  Denies needs or complaints at this time.  Continue Q 15 min safety checks per unit policy. ?

## 2021-05-19 NOTE — Progress Notes (Signed)
[facility] MD Progress Note ? ?05/19/2021 10:43 AM ?Austin Rhodes  ?MRN:  [id number] ?Subjective: Austin Rhodes is seen on rounds today.  He is pleasant and cooperative and alert.  He has his episodes of being agitated but for the most part he is doing better.  He is not as tangential and pressured.  The Prolixin seems to have helped.  He has been compliant with his medications and has no complaints.  He still delusional but they are fixed delusions.  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 ?No side effects from the medications. ? ?Principal Problem: Bipolar I disorder, current or most recent episode manic, with psychotic features (HCC) ?Diagnosis: Principal Problem: ?  Bipolar I disorder, current or most recent episode manic, with psychotic features (HCC) ?Active Problems: ?  Severe manic bipolar 1 disorder with psychotic behavior (HCC) ?  Bipolar 1 disorder (HCC) ? ?Total Time spent with patient: 15 minutes ? ?Past Psychiatric History:  Multiple hospitalizations for bipolar 1 mania. [facility]  ? ?Past Medical History:  ?Past Medical History:  ?Diagnosis Date  ? Bipolar disorder (HCC)   ? Coronary artery disease   ? GERD (gastroesophageal reflux disease)   ? History of multiple strokes   ? PTSD (post-traumatic stress disorder)   ?  ?Past Surgical History:  ?Procedure Laterality Date  ? CARDIAC CATHETERIZATION    ? ?Family History: History reviewed. No pertinent family history. ? ?Social History:  ?Social History  ? ?Substance and Sexual Activity  ?Alcohol Use No  ?   ?Social History  ? ?Substance and Sexual Activity  ?Drug Use Never  ?  ?Social History  ? ?Socioeconomic History  ? Marital status: Divorced  ?  Spouse name: Not on file  ? Number of children: Not on file  ? Years of education: Not on file  ? Highest education level: Not on file  ?Occupational History  ? Not on file  ?Tobacco Use  ? Smoking status: Never  ? Smokeless tobacco: Never  ?Vaping  Use  ? Vaping Use: Never used  ?Substance and Sexual Activity  ? Alcohol use: No  ? Drug use: Never  ? Sexual activity: Not on file  ?Other Topics Concern  ? Not on file  ?Social History Narrative  ? Not on file  ? ?Social Determinants of Health  ? ?Financial Resource Strain: Not on file  ?Food Insecurity: Not on file  ?Transportation Needs: Not on file  ?Physical Activity: Not on file  ?Stress: Not on file  ?Social Connections: Not on file  ? ?Additional Social History:  ?  ?  ?  ?  ?  ?  ?  ?  ?  ?  ?  ? ?Sleep: Good ? ?Appetite:  Good ? ?Current Medications: ?Current Facility-Administered Medications  ?Medication Dose Route Frequency Provider Last Rate Last Admin  ? acetaminophen (TYLENOL) tablet 650 mg  650 mg Oral Q6H PRN Sarina Ill, DO   650 mg at 05/01/21 1515  ? albuterol (PROVENTIL) (2.5 MG/3ML) 0.083% nebulizer solution 3 mL  3 mL Inhalation Q4H PRN Sarina Ill, DO      ? alum & mag hydroxide-simeth (MAALOX/MYLANTA) 200-200-20 MG/5ML suspension 30 mL  30 mL Oral Q4H PRN Sarina Ill, DO      ? chlorproMAZINE (THORAZINE) tablet 50 mg  50 mg Oral Q4H PRN Sarina Ill, DO   50 mg at 05/13/21 2129  ? Or  ? chlorproMAZINE (  THORAZINE) injection 50 mg  50 mg Intramuscular Q4H PRN Sarina IllHerrick, Jaydrian Corpening Edward, DO   50 mg at 05/01/21 [id number]  ? clopidogrel (PLAVIX) tablet 75 mg  75 mg Oral Daily Sarina IllHerrick, Edris Schneck Edward, DO   75 mg at 05/19/21 1018  ? divalproex (DEPAKOTE ER) 24 hr tablet 1,500 mg  1,500 mg Oral QHS Clapacs, John T, MD      ? fluPHENAZine (PROLIXIN) tablet 2.5 mg  2.5 mg Oral Q breakfast Clapacs, John T, MD   2.5 mg at 05/19/21 1017  ? fluPHENAZine (PROLIXIN) tablet 7.5 mg  7.5 mg Oral QHS Sarina IllHerrick, Azani Brogdon Edward, DO   7.5 mg at 05/18/21 2123  ? lamoTRIgine (LAMICTAL) tablet 50 mg  50 mg Oral BID Sarina IllHerrick, Marius Betts Edward, DO   50 mg at 05/19/21 1018  ? LORazepam (ATIVAN) tablet 1 mg  1 mg Oral Q4H PRN Sarina IllHerrick, Tiasha Helvie Edward, DO   1 mg at 05/05/21 [id number]  ?  losartan (COZAAR) tablet 100 mg  100 mg Oral Daily Sarina IllHerrick, Jasten Guyette Edward, DO   100 mg at 05/19/21 1017  ? magnesium hydroxide (MILK OF MAGNESIA) suspension 30 mL  30 mL Oral Daily PRN Vanetta MuldersBarthold, Louise F, NP      ? QUEtiapine (SEROQUEL) tablet 400 mg  400 mg Oral QHS Sarina IllHerrick, Kaye Luoma Edward, DO   400 mg at 05/18/21 2107  ? rosuvastatin (CRESTOR) tablet 10 mg  10 mg Oral Daily Sarina IllHerrick, Barbaraann Avans Edward, DO   10 mg at 05/19/21 1017  ? traZODone (DESYREL) tablet 100 mg  100 mg Oral QHS PRN Sarina IllHerrick, Sejal Cofield Edward, DO   100 mg at 05/14/21 2207  ? ? ?Lab Results: No results found for this or any previous visit (from the past 48 hour(s)). ? ?Blood Alcohol level:  ?Lab Results  ?Component Value Date  ? ETH <10 04/28/2021  ? ETH <10 03/13/2020  ? ? ?Metabolic Disorder Labs: ?Lab Results  ?Component Value Date  ? HGBA1C 5.7 (H) 05/06/2021  ? MPG 117 05/06/2021  ? MPG 105.41 04/28/2021  ? ?No results found for: PROLACTIN ?Lab Results  ?Component Value Date  ? CHOL 103 05/06/2021  ? TRIG 66 05/06/2021  ? HDL 41 05/06/2021  ? CHOLHDL 2.5 05/06/2021  ? VLDL 13 05/06/2021  ? LDLCALC 49 05/06/2021  ? LDLCALC 40 03/15/2020  ? ? ?Physical Findings: ?AIMS:  , ,  ,  ,    ?CIWA:    ?COWS:    ? ?Musculoskeletal: ?Strength & Muscle Tone: within normal limits ?Gait & Station: normal ?Patient leans: N/A ? ?Psychiatric Specialty Exam: ? ?Presentation  ?General Appearance: Disheveled ? ?Eye Contact:Fair ? ?Speech:Slow ? ?Speech Volume:Decreased ? ?Handedness:Right ? ? ?Mood and Affect  ?Mood:Angry; Anxious; Irritable ? ?Affect:Restricted ? ? ?Thought Process  ?Thought Processes:Disorganized ? ?Descriptions of Associations:Loose ? ?Orientation:Partial ? ?Thought Content:Delusions; Illogical; Paranoid Ideation; Perseveration; Scattered ? ?History of Schizophrenia/Schizoaffective disorder:No ? ?Duration of Psychotic Symptoms:Greater than six months ? ?Hallucinations:No data recorded ?Ideas of Reference:Paranoia ? ?Suicidal Thoughts:No data  recorded ?Homicidal Thoughts:No data recorded ? ?Sensorium  ?Memory:Immediate Poor; Recent Poor; Remote Poor ? ?Judgment:Impaired ? ?Insight:Poor ? ? ?Executive Functions  ?Concentration:Poor ? ?Attention Span:Poor ? ?Recall:Poor ? ?Fund of Knowledge:Poor ? ?Language:Poor ? ? ?Psychomotor Activity  ?Psychomotor Activity:No data recorded ? ?Assets  ?Assets:Resilience ? ? ?Sleep  ?Sleep:No data recorded ? ? ?Physical Exam: ?Physical Exam ?Vitals and nursing note reviewed.  ?Constitutional:   ?   Appearance: Normal appearance. He is normal weight.  ?Neurological:  ?   General: No  focal deficit present.  ?   Mental Status: He is alert and oriented to person, place, and time.  ?Psychiatric:     ?   Attention and Perception: Attention and perception normal.     ?   Mood and Affect: Mood and affect normal.     ?   Speech: Speech normal.     ?   Behavior: Behavior normal. Behavior is cooperative.     ?   Thought Content: Thought content is delusional.     ?   Cognition and Memory: Cognition and memory normal.     ?   Judgment: Judgment normal.  ? ?Review of Systems  ?Constitutional: Negative.   ?HENT: Negative.    ?Eyes: Negative.   ?Respiratory: Negative.    ?Cardiovascular: Negative.   ?Gastrointestinal: Negative.   ?Genitourinary: Negative.   ?Musculoskeletal: Negative.   ?Skin: Negative.   ?Neurological: Negative.   ?Endo/Heme/Allergies: Negative.   ?Psychiatric/Behavioral:  The patient is nervous/anxious.   ?Blood pressure (!) 125/101, pulse 78, temperature 97.7 ?F (36.5 ?C), temperature source Oral, resp. rate 20, height 5\' 8"  (1.727 m), weight 76.2 kg, SpO2 100 %. Body mass index is 25.54 kg/m?. ? ? ?Treatment Plan Summary: ?Daily contact with patient to assess and evaluate symptoms and progress in treatment, Medication management, and Plan continue current medications. ? ? , DO ?05/19/2021, 10:43 AM ? ?

## 2021-05-19 NOTE — Plan of Care (Signed)
  Problem: Group Participation Goal: STG - Patient will focus on task/topic with 2 prompts from staff within 5 recreation therapy group sessions Description: STG - Patient will focus on task/topic with 2 prompts from staff within 5 recreation therapy group sessions Outcome: Progressing   

## 2021-05-19 NOTE — [facility] Group Notes (Signed)
14:00 group chair yoga , very active  ?

## 2021-05-19 NOTE — [facility] Counselor (Signed)
CSW was contacted by pt's daughter, [place], [place] who stated that pt would receive a visit from the assisted living facility, [facility], on Tuesday, but could not confirm a time.  She stated that she would provide transportation as discharge to facility and provide final date of discharge when known.  ? ?CSW will follow up with date of discharge once finalized.  ? ?No other requests were made. Conversation ended without incident.  ? ?Catcher Dehoyos [place], MSW, LCSW-A ?4/17/20233:40 PM  ?

## 2021-05-19 NOTE — Progress Notes (Signed)
Recreation Therapy Notes ? ?Date: 05/19/2021 ?  ?Time: 1:30 pm  ?  ?Location: Craft room    ?  ?Behavioral response: N/A ?  ?Intervention Topic: Time Management     ?  ?Discussion/Intervention: ?Patient refused to attend group.  ?  ?Clinical Observations/Feedback:  ?Patient refused to attend group.  ?  ?Taria Castrillo LRT/CTRS ? ? ? ? ? ? ? ?Austin Rhodes ?05/19/2021 1:38 PM ?

## 2021-05-20 DIAGNOSIS — F312 Bipolar disorder, current episode manic severe with psychotic features: Secondary | ICD-10-CM | POA: Diagnosis not present

## 2021-05-20 NOTE — Progress Notes (Signed)
Patient presents alert and oriented x3.  Patient affect is calm and quite polite and pleasant. Patient denies AVH, SI, HI, pain, anxiety or depression.  Reports appetite is good and sleeping well.  Assisted patient with shower and shave per his request "so I can be ready for my interview with the facility tomorrow."  Pt compliant with all scheduled medication.  Denies needs or complaints at this time.  Continue Q 15 min safety checks per unit policy. ?

## 2021-05-20 NOTE — Progress Notes (Signed)
Recreation Therapy Notes ?  ?Date: 05/20/2021 ?  ?Time: 1:35pm   ?  ?Location: Courtyard    ?  ?Behavioral response: Appropriate ?  ?Intervention Topic: Leisure   ?  ?Discussion/Intervention:  ?Group content today was focused on leisure. The group defined what leisure is and some positive leisure activities they participate in. Individuals identified the difference between good and bad leisure. Participants expressed how they feel after participating in the leisure of their choice. The group discussed how they go about picking a leisure activity and if others are involved in their leisure activities. The patient stated how many leisure activities they have to choose from and reasons why it is important to have leisure time. Individuals participated in the intervention ?Exploration of Leisure? where they had a chance to identify new leisure activities as well as benefits of leisure. ?Clinical Observations/Feedback: ?Patient came to group and was able to explore participate in many leisure activities. Participant was able to identify leisure activities they enjoy outside of the hospital. Individual was social with peers and staff while participating in the intervention.    ?Ariyanna Oien LRT/CTRS  ?  ? ? ? ? ? ? ? ?Yelina Sarratt ?05/20/2021 2:33 PM ?

## 2021-05-20 NOTE — Plan of Care (Signed)
?  Problem: Education: ?Goal: Knowledge of [facility] Education information/materials will improve ?Outcome: Progressing ?Goal: Mental status will improve ?Outcome: Progressing ?  ?Problem: Health Behavior/Discharge Planning: ?Goal: Identification of resources available to assist in meeting health care needs will improve ?Outcome: Progressing ?Goal: Compliance with treatment plan for underlying cause of condition will improve ?Outcome: Progressing ?  ?Problem: Safety: ?Goal: Periods of time without injury will increase ?Outcome: Progressing ?  ?Problem: Education: ?Goal: Knowledge of General Education information will improve ?Description: Including pain rating scale, medication(s)/side effects and non-pharmacologic comfort measures ?Outcome: Progressing ?  ?Problem: Health Behavior/Discharge Planning: ?Goal: Ability to manage health-related needs will improve ?Outcome: Progressing ?  ?Problem: Clinical Measurements: ?Goal: Ability to maintain clinical measurements within normal limits will improve ?Outcome: Progressing ?Goal: Will remain free from infection ?Outcome: Progressing ?Goal: Diagnostic test results will improve ?Outcome: Progressing ?Goal: Respiratory complications will improve ?Outcome: Progressing ?Goal: Cardiovascular complication will be avoided ?Outcome: Progressing ?  ?Problem: Activity: ?Goal: Risk for activity intolerance will decrease ?Outcome: Progressing ?  ?Problem: Nutrition: ?Goal: Adequate nutrition will be maintained ?Outcome: Progressing ?  ?Problem: Coping: ?Goal: Level of anxiety will decrease ?Outcome: Progressing ?  ?Problem: Elimination: ?Goal: Will not experience complications related to bowel motility ?Outcome: Progressing ?Goal: Will not experience complications related to urinary retention ?Outcome: Progressing ?  ?Problem: Pain Managment: ?Goal: General experience of comfort will improve ?Outcome: Progressing ?  ?Problem: Safety: ?Goal: Ability to remain free from  injury will improve ?Outcome: Progressing ?  ?Problem: Skin Integrity: ?Goal: Risk for impaired skin integrity will decrease ?Outcome: Progressing ?  ?

## 2021-05-20 NOTE — Progress Notes (Signed)
[facility] MD Progress Note ? ?05/20/2021 12:13 PM ?Austin Rhodes  ?MRN:  [id number] ?Subjective: Patient was seen on rounds.  He has been much more pleasant and cooperative.  Continues to be delusional.  He has been compliant with his medications and there is no evidence of side effects.  Social work has been in touch with a assisted living as below. ? ?"CSW contacted [facility] [phone] and talked with admissions coordiantor, Annice Pih, [phone], [email or web address] who stated that she would meet pt sometime this week. CSW will send referral forms to complete admission process."  ?  ?Principal Problem: Bipolar I disorder, current or most recent episode manic, with psychotic features (HCC) ?Diagnosis: Principal Problem: ?  Bipolar I disorder, current or most recent episode manic, with psychotic features (HCC) ?Active Problems: ?  Severe manic bipolar 1 disorder with psychotic behavior (HCC) ?  Bipolar 1 disorder (HCC) ? ?Total Time spent with patient: 15 minutes ? ?Past Psychiatric History: [employer]  ? ?Past Medical History:  ?Past Medical History:  ?Diagnosis Date  ? Bipolar disorder (HCC)   ? Coronary artery disease   ? GERD (gastroesophageal reflux disease)   ? History of multiple strokes   ? PTSD (post-traumatic stress disorder)   ?  ?Past Surgical History:  ?Procedure Laterality Date  ? CARDIAC CATHETERIZATION    ? ?Family History: History reviewed. No pertinent family history. ? ?Social History:  ?Social History  ? ?Substance and Sexual Activity  ?Alcohol Use No  ?   ?Social History  ? ?Substance and Sexual Activity  ?Drug Use Never  ?  ?Social History  ? ?Socioeconomic History  ? Marital status: Divorced  ?  Spouse name: Not on file  ? Number of children: Not on file  ? Years of education: Not on file  ? Highest education level: Not on file  ?Occupational History  ? Not on file  ?Tobacco Use  ? Smoking status: Never  ? Smokeless tobacco: Never  ?Vaping Use   ? Vaping Use: Never used  ?Substance and Sexual Activity  ? Alcohol use: No  ? Drug use: Never  ? Sexual activity: Not on file  ?Other Topics Concern  ? Not on file  ?Social History Narrative  ? Not on file  ? ?Social Determinants of Health  ? ?Financial Resource Strain: Not on file  ?Food Insecurity: Not on file  ?Transportation Needs: Not on file  ?Physical Activity: Not on file  ?Stress: Not on file  ?Social Connections: Not on file  ? ?Additional Social History:  ?  ?  ?  ?  ?  ?  ?  ?  ?  ?  ?  ? ?Sleep: Good ? ?Appetite:  Good ? ?Current Medications: ?Current Facility-Administered Medications  ?Medication Dose Route Frequency Provider Last Rate Last Admin  ? acetaminophen (TYLENOL) tablet 650 mg  650 mg Oral Q6H PRN Sarina Ill, DO   650 mg at 05/01/21 1515  ? albuterol (PROVENTIL) (2.5 MG/3ML) 0.083% nebulizer solution 3 mL  3 mL Inhalation Q4H PRN Sarina Ill, DO      ? alum & mag hydroxide-simeth (MAALOX/MYLANTA) 200-200-20 MG/5ML suspension 30 mL  30 mL Oral Q4H PRN Sarina Ill, DO      ? chlorproMAZINE (THORAZINE) tablet 50 mg  50 mg Oral Q4H PRN Sarina Ill, DO   50 mg at 05/13/21 2129  ? Or  ? chlorproMAZINE (THORAZINE) injection 50 mg  50 mg Intramuscular Q4H PRN Elane Fritz  Edward, DO   50 mg at 05/01/21 [id number]  ? clopidogrel (PLAVIX) tablet 75 mg  75 mg Oral Daily Sarina IllHerrick, Stpehen Petitjean Edward, DO   75 mg at 05/20/21 [id number]  ? divalproex (DEPAKOTE ER) 24 hr tablet 1,500 mg  1,500 mg Oral QHS Clapacs, John T, MD   1,500 mg at 05/19/21 2105  ? fluPHENAZine (PROLIXIN) tablet 2.5 mg  2.5 mg Oral Q breakfast Clapacs, Jackquline DenmarkJohn T, MD   2.5 mg at 05/20/21 0816  ? fluPHENAZine (PROLIXIN) tablet 7.5 mg  7.5 mg Oral QHS Sarina IllHerrick, Cambryn Charters Edward, DO   7.5 mg at 05/19/21 2104  ? lamoTRIgine (LAMICTAL) tablet 50 mg  50 mg Oral BID Sarina IllHerrick, Yahye Siebert Edward, DO   50 mg at 05/20/21 [id number]  ? LORazepam (ATIVAN) tablet 1 mg  1 mg Oral Q4H PRN Sarina IllHerrick, Senan Urey Edward, DO   1 mg at  05/05/21 [id number]  ? losartan (COZAAR) tablet 100 mg  100 mg Oral Daily Sarina IllHerrick, Lavren Lewan Edward, DO   100 mg at 05/20/21 [id number]  ? magnesium hydroxide (MILK OF MAGNESIA) suspension 30 mL  30 mL Oral Daily PRN Gabriel CirriBarthold, Louise F, NP      ? QUEtiapine (SEROQUEL) tablet 400 mg  400 mg Oral QHS Sarina IllHerrick, Nhu Glasby Edward, DO   400 mg at 05/19/21 2105  ? rosuvastatin (CRESTOR) tablet 10 mg  10 mg Oral Daily Sarina IllHerrick, Trenika Hudson Edward, DO   10 mg at 05/20/21 [id number]  ? traZODone (DESYREL) tablet 100 mg  100 mg Oral QHS PRN Sarina IllHerrick, Shameika Speelman Edward, DO   100 mg at 05/14/21 2207  ? tuberculin injection 5 Units  5 Units Intradermal Once Sarina IllHerrick, Jakyrie Totherow Edward, DO   5 Units at 05/20/21 1030  ? ? ?Lab Results: No results found for this or any previous visit (from the past 48 hour(s)). ? ?Blood Alcohol level:  ?Lab Results  ?Component Value Date  ? ETH <10 04/28/2021  ? ETH <10 03/13/2020  ? ? ?Metabolic Disorder Labs: ?Lab Results  ?Component Value Date  ? HGBA1C 5.7 (H) 05/06/2021  ? MPG 117 05/06/2021  ? MPG 105.41 04/28/2021  ? ?No results found for: PROLACTIN ?Lab Results  ?Component Value Date  ? CHOL 103 05/06/2021  ? TRIG 66 05/06/2021  ? HDL 41 05/06/2021  ? CHOLHDL 2.5 05/06/2021  ? VLDL 13 05/06/2021  ? LDLCALC 49 05/06/2021  ? LDLCALC 40 03/15/2020  ? ? ?Physical Findings: ?AIMS:  , ,  ,  ,    ?CIWA:    ?COWS:    ? ?Musculoskeletal: ?Strength & Muscle Tone: within normal limits ?Gait & Station: unsteady ?Patient leans: N/A ? ?Psychiatric Specialty Exam: ? ?Presentation  ?General Appearance: Disheveled ? ?Eye Contact:Fair ? ?Speech:Slow ? ?Speech Volume:Decreased ? ?Handedness:Right ? ? ?Mood and Affect  ?Mood:Angry; Anxious; Irritable ? ?Affect:Restricted ? ? ?Thought Process  ?Thought Processes:Disorganized ? ?Descriptions of Associations:Loose ? ?Orientation:Partial ? ?Thought Content:Delusions; Illogical; Paranoid Ideation; Perseveration; Scattered ? ?History of Schizophrenia/Schizoaffective disorder:No ? ?Duration of Psychotic  Symptoms:Greater than six months ? ?Hallucinations:No data recorded ?Ideas of Reference:Paranoia ? ?Suicidal Thoughts:No data recorded ?Homicidal Thoughts:No data recorded ? ?Sensorium  ?Memory:Immediate Poor; Recent Poor; Remote Poor ? ?Judgment:Impaired ? ?Insight:Poor ? ? ?Executive Functions  ?Concentration:Poor ? ?Attention Span:Poor ? ?Recall:Poor ? ?Fund of Knowledge:Poor ? ?Language:Poor ? ? ?Psychomotor Activity  ?Psychomotor Activity:No data recorded ? ?Assets  ?Assets:Resilience ? ? ?Sleep  ?Sleep:No data recorded ? ? ?Physical Exam: ?Physical Exam ?Vitals and nursing note reviewed.  ?Constitutional:   ?   Appearance: Normal  appearance. He is normal weight.  ?Neurological:  ?   General: No focal deficit present.  ?   Mental Status: He is alert and oriented to person, place, and time.  ?Psychiatric:     ?   Attention and Perception: Attention normal. He perceives auditory hallucinations.     ?   Mood and Affect: Mood and affect normal.     ?   Speech: Speech is tangential.     ?   Behavior: Behavior normal. Behavior is cooperative.     ?   Thought Content: Thought content is delusional.  ? ?ROS ?Blood pressure (!) 136/92, pulse 91, temperature (!) 97.5 ?F (36.4 ?C), temperature source Oral, resp. rate 20, height 5\' 8"  (1.727 m), weight 76.2 kg, SpO2 98 %. Body mass index is 25.54 kg/m?. ? ? ?Treatment Plan Summary: ?Daily contact with patient to assess and evaluate symptoms and progress in treatment, Medication management, and Plan continue current medications. ? ? , DO ?05/20/2021, 12:13 PM ? ?

## 2021-05-20 NOTE — Progress Notes (Signed)
Pt denies SI/HI and AVH. He has been visible the unit, attending groups and interacting with peers/staff. Pt denies anxiety and depression. Someone form assistive living is coming to evaluate him for placement. No complaints voiced. Pt ambulating well with his walker. TB test done rt forearm. ?

## 2021-05-20 NOTE — BH IP Treatment Plan (Signed)
Interdisciplinary Treatment and Diagnostic Plan Update ? ?05/20/2021 ?Time of Session: 9:30AM ?Austin Rhodes ?MRN: [id number] ? ?Principal Diagnosis: Bipolar I disorder, current or most recent episode manic, with psychotic features ([place]) ? ?Secondary Diagnoses: Principal Problem: ?  Bipolar I disorder, current or most recent episode manic, with psychotic features ([place]) ?Active Problems: ?  Severe manic bipolar 1 disorder with psychotic behavior ([place]) ?  Bipolar 1 disorder ([place]) ? ? ?Current Medications:  ?Current Facility-Administered Medications  ?Medication Dose Route Frequency Provider Last Rate Last Admin  ? acetaminophen (TYLENOL) tablet 650 mg  650 mg Oral Q6H PRN Parks Ranger, DO   650 mg at 05/01/21 1515  ? albuterol (PROVENTIL) (2.5 MG/3ML) 0.083% nebulizer solution 3 mL  3 mL Inhalation Q4H PRN Parks Ranger, DO      ? alum & mag hydroxide-simeth (MAALOX/MYLANTA) 200-200-20 MG/5ML suspension 30 mL  30 mL Oral Q4H PRN Parks Ranger, DO      ? chlorproMAZINE (THORAZINE) tablet 50 mg  50 mg Oral Q4H PRN Parks Ranger, DO   50 mg at 05/13/21 2129  ? Or  ? chlorproMAZINE (THORAZINE) injection 50 mg  50 mg Intramuscular Q4H PRN Parks Ranger, DO   50 mg at 05/01/21 0254  ? clopidogrel (PLAVIX) tablet 75 mg  75 mg Oral Daily Parks Ranger, DO   75 mg at 05/20/21 2706  ? divalproex (DEPAKOTE ER) 24 hr tablet 1,500 mg  1,500 mg Oral QHS Clapacs, John T, MD   1,500 mg at 05/19/21 2105  ? fluPHENAZine (PROLIXIN) tablet 2.5 mg  2.5 mg Oral Q breakfast Clapacs, Madie Reno, MD   2.5 mg at 05/20/21 0816  ? fluPHENAZine (PROLIXIN) tablet 7.5 mg  7.5 mg Oral QHS Parks Ranger, DO   7.5 mg at 05/19/21 2104  ? lamoTRIgine (LAMICTAL) tablet 50 mg  50 mg Oral BID Parks Ranger, DO   50 mg at 05/20/21 2376  ? LORazepam (ATIVAN) tablet 1 mg  1 mg Oral Q4H PRN Parks Ranger, DO   1 mg at 05/05/21 2831  ? losartan (COZAAR) tablet 100 mg   100 mg Oral Daily Parks Ranger, DO   100 mg at 05/20/21 5176  ? magnesium hydroxide (MILK OF MAGNESIA) suspension 30 mL  30 mL Oral Daily PRN Waldon Merl F, NP      ? QUEtiapine (SEROQUEL) tablet 400 mg  400 mg Oral QHS Parks Ranger, DO   400 mg at 05/19/21 2105  ? rosuvastatin (CRESTOR) tablet 10 mg  10 mg Oral Daily Parks Ranger, DO   10 mg at 05/20/21 1607  ? traZODone (DESYREL) tablet 100 mg  100 mg Oral QHS PRN Parks Ranger, DO   100 mg at 05/14/21 2207  ? tuberculin injection 5 Units  5 Units Intradermal Once Parks Ranger, DO      ? ?PTA Medications: ?Medications Prior to Admission  ?Medication Sig Dispense Refill Last Dose  ? acetaminophen (TYLENOL) 325 MG tablet Take 2 tablets (650 mg total) by mouth every 6 (six) hours as needed for mild pain or moderate pain. 30 tablet 0   ? albuterol (VENTOLIN HFA) 108 (90 Base) MCG/ACT inhaler Inhale 1-2 puffs into the lungs every 4 (four) hours as needed for wheezing or shortness of breath. 1 each 0   ? amoxicillin-clavulanate (AUGMENTIN) 875-125 MG tablet Take 1 tablet by mouth 2 (two) times daily. X 7 days 14 tablet 0   ? clopidogrel (  PLAVIX) 75 MG tablet Take 1 tablet (75 mg total) by mouth daily. 30 tablet 1   ? divalproex (DEPAKOTE ER) 500 MG 24 hr tablet Take 500 mg by mouth 3 (three) times daily.     ? divalproex (DEPAKOTE) 500 MG DR tablet Take 1 tablet (500 mg total) by mouth every morning AND 2 tablets (1,000 mg total) at bedtime. 90 tablet 1   ? fluticasone (FLONASE) 50 MCG/ACT nasal spray Place 2 sprays into both nostrils daily. 16 g 0   ? hydrOXYzine (ATARAX/VISTARIL) 50 MG tablet Take 1 tablet (50 mg total) by mouth 3 (three) times daily as needed for anxiety. 30 tablet 1   ? lamoTRIgine (LAMICTAL) 25 MG tablet Take by mouth.     ? Lamotrigine 35 x 25 MG KIT Take 25-100 mg by mouth as directed. 1 kit 0   ? metoprolol succinate (TOPROL-XL) 25 MG 24 hr tablet Take 25 mg by mouth daily.     ?  QUEtiapine (SEROQUEL) 400 MG tablet Take 1 tablet (400 mg total) by mouth at bedtime. 30 tablet 1   ? rosuvastatin (CRESTOR) 10 MG tablet Take 1 tablet (10 mg total) by mouth daily. 30 tablet 1   ? Spacer/Aero-Holding Chambers (AEROCHAMBER PLUS) inhaler Use with inhaler 1 each 2   ? tamsulosin (FLOMAX) 0.4 MG CAPS capsule Take 1 capsule (0.4 mg total) by mouth daily after supper. 30 capsule 1   ? ? ?Patient Stressors: Other: Unable to assess due to manic state   ? ?Patient Strengths: Active sense of humor  ?Communication skills  ? ?Treatment Modalities: Medication Management, Group therapy, Case management,  ?1 to 1 session with clinician, Psychoeducation, Recreational therapy. ? ? ?Physician Treatment Plan for Primary Diagnosis: Bipolar I disorder, current or most recent episode manic, with psychotic features ([place]) ?Long Term Goal(s): Improvement in symptoms so as ready for discharge  ? ?Short Term Goals: Ability to identify changes in lifestyle to reduce recurrence of condition will improve ?Ability to verbalize feelings will improve ?Ability to disclose and discuss suicidal ideas ?Ability to demonstrate self-control will improve ?Ability to identify and develop effective coping behaviors will improve ?Ability to maintain clinical measurements within normal limits will improve ?Compliance with prescribed medications will improve ?Ability to identify triggers associated with substance abuse/mental health issues will improve ? ?Medication Management: Evaluate patient's response, side effects, and tolerance of medication regimen. ? ?Therapeutic Interventions: 1 to 1 sessions, Unit Group sessions and Medication administration. ? ?Evaluation of Outcomes: Progressing ? ?Physician Treatment Plan for Secondary Diagnosis: Principal Problem: ?  Bipolar I disorder, current or most recent episode manic, with psychotic features ([place]) ?Active Problems: ?  Severe manic bipolar 1 disorder with psychotic behavior ([place]) ?  Bipolar  1 disorder ([place]) ? ?Long Term Goal(s): Improvement in symptoms so as ready for discharge  ? ?Short Term Goals: Ability to identify changes in lifestyle to reduce recurrence of condition will improve ?Ability to verbalize feelings will improve ?Ability to disclose and discuss suicidal ideas ?Ability to demonstrate self-control will improve ?Ability to identify and develop effective coping behaviors will improve ?Ability to maintain clinical measurements within normal limits will improve ?Compliance with prescribed medications will improve ?Ability to identify triggers associated with substance abuse/mental health issues will improve    ? ?Medication Management: Evaluate patient's response, side effects, and tolerance of medication regimen. ? ?Therapeutic Interventions: 1 to 1 sessions, Unit Group sessions and Medication administration. ? ?Evaluation of Outcomes: Progressing ? ? ?RN Treatment Plan for Primary  Diagnosis: Bipolar I disorder, current or most recent episode manic, with psychotic features ([place]) ?Long Term Goal(s): Knowledge of disease and therapeutic regimen to maintain health will improve ? ?Short Term Goals: Ability to remain free from injury will improve, Ability to verbalize frustration and anger appropriately will improve, Ability to demonstrate self-control, Ability to participate in decision making will improve, Ability to verbalize feelings will improve, Ability to identify and develop effective coping behaviors will improve, and Compliance with prescribed medications will improve ? ?Medication Management: RN will administer medications as ordered by provider, will assess and evaluate patient's response and provide education to patient for prescribed medication. RN will report any adverse and/or side effects to prescribing provider. ? ?Therapeutic Interventions: 1 on 1 counseling sessions, Psychoeducation, Medication administration, Evaluate responses to treatment, Monitor vital signs and CBGs as  ordered, Perform/monitor CIWA, COWS, AIMS and Fall Risk screenings as ordered, Perform wound care treatments as ordered. ? ?Evaluation of Outcomes: Progressing ? ? ?LCSW Treatment Plan for Primary Diagnosis: B

## 2021-05-20 NOTE — [facility] Counselor (Signed)
CSW contacted [facility] [phone] and talked with admissions coordiantor, Annice Pih, [phone], [email or web address] who stated that she would meet pt sometime this week. CSW will send referral forms to complete admission process.  ? ?Tanasia Budzinski [place], MSW, LCSW-A ?4/18/202311:16 AM  ?

## 2021-05-21 DIAGNOSIS — F312 Bipolar disorder, current episode manic severe with psychotic features: Secondary | ICD-10-CM | POA: Diagnosis not present

## 2021-05-21 NOTE — [facility] Group Notes (Signed)
[facility] Group Notes:  (Nursing/MHT/Case Management/Adjunct) ? ?Date:  05/21/2021  ?Time:  10:00 AM ? ?Type of Therapy:  Psychoeducational Skills ? ?Participation Level:  Active ? ?Participation Quality:  Appropriate ? ?Affect:  Appropriate ? ?Cognitive:  Appropriate ? ?Insight:  Appropriate ? ?Engagement in Group:  Engaged ? ?Modes of Intervention:  Discussion ? ?Summary of Progress/Problems: ?The pt attended group and was actively engaged and had many contributions to discussion. ?Barbaraann Rondo ?05/21/2021, 11:27 AM ?

## 2021-05-21 NOTE — Progress Notes (Signed)
[facility] MD Progress Note ? ?05/21/2021 12:07 PM ?Alyson Reedy Hillmer  ?MRN:  [id number] ?Subjective: Austin Rhodes is doing pretty well.  He remains delusional but pleasant and cooperative.  He has been taking his medications as prescribed and denies any side effects.  We are awaiting assisted living evaluation.  He does not have any complaints and currently no issues. ? ?Principal Problem: Bipolar I disorder, current or most recent episode manic, with psychotic features (HCC) ?Diagnosis: Principal Problem: ?  Bipolar I disorder, current or most recent episode manic, with psychotic features (HCC) ?Active Problems: ?  Severe manic bipolar 1 disorder with psychotic behavior (HCC) ?  Bipolar 1 disorder (HCC) ? ?Total Time spent with patient: 15 minutes ? ?Past Psychiatric History: [employer]  ? ?Past Medical History:  ?Past Medical History:  ?Diagnosis Date  ? Bipolar disorder (HCC)   ? Coronary artery disease   ? GERD (gastroesophageal reflux disease)   ? History of multiple strokes   ? PTSD (post-traumatic stress disorder)   ?  ?Past Surgical History:  ?Procedure Laterality Date  ? CARDIAC CATHETERIZATION    ? ?Family History: History reviewed. No pertinent family history. ? ?Social History:  ?Social History  ? ?Substance and Sexual Activity  ?Alcohol Use No  ?   ?Social History  ? ?Substance and Sexual Activity  ?Drug Use Never  ?  ?Social History  ? ?Socioeconomic History  ? Marital status: Divorced  ?  Spouse name: Not on file  ? Number of children: Not on file  ? Years of education: Not on file  ? Highest education level: Not on file  ?Occupational History  ? Not on file  ?Tobacco Use  ? Smoking status: Never  ? Smokeless tobacco: Never  ?Vaping Use  ? Vaping Use: Never used  ?Substance and Sexual Activity  ? Alcohol use: No  ? Drug use: Never  ? Sexual activity: Not on file  ?Other Topics Concern  ? Not on file  ?Social History Narrative  ? Not on file  ? ?Social Determinants of Health  ? ?Financial  Resource Strain: Not on file  ?Food Insecurity: Not on file  ?Transportation Needs: Not on file  ?Physical Activity: Not on file  ?Stress: Not on file  ?Social Connections: Not on file  ? ?Additional Social History:  ?  ?  ?  ?  ?  ?  ?  ?  ?  ?  ?  ? ?Sleep: Good ? ?Appetite:  Good ? ?Current Medications: ?Current Facility-Administered Medications  ?Medication Dose Route Frequency Provider Last Rate Last Admin  ? acetaminophen (TYLENOL) tablet 650 mg  650 mg Oral Q6H PRN Sarina Ill, DO   650 mg at 05/01/21 1515  ? albuterol (PROVENTIL) (2.5 MG/3ML) 0.083% nebulizer solution 3 mL  3 mL Inhalation Q4H PRN Sarina Ill, DO      ? alum & mag hydroxide-simeth (MAALOX/MYLANTA) 200-200-20 MG/5ML suspension 30 mL  30 mL Oral Q4H PRN Sarina Ill, DO      ? chlorproMAZINE (THORAZINE) tablet 50 mg  50 mg Oral Q4H PRN Sarina Ill, DO   50 mg at 05/13/21 2129  ? Or  ? chlorproMAZINE (THORAZINE) injection 50 mg  50 mg Intramuscular Q4H PRN Sarina Ill, DO   50 mg at 05/01/21 1324  ? clopidogrel (PLAVIX) tablet 75 mg  75 mg Oral Daily Sarina Ill, DO   75 mg at 05/21/21 0919  ? divalproex (DEPAKOTE ER) 24 hr tablet  1,500 mg  1,500 mg Oral QHS Clapacs, Jackquline DenmarkJohn T, MD   1,500 mg at 05/20/21 2110  ? fluPHENAZine (PROLIXIN) tablet 2.5 mg  2.5 mg Oral Q breakfast Clapacs, Jackquline DenmarkJohn T, MD   2.5 mg at 05/21/21 0919  ? fluPHENAZine (PROLIXIN) tablet 7.5 mg  7.5 mg Oral QHS Sarina IllHerrick, Ceri Mayer Edward, DO   7.5 mg at 05/20/21 2110  ? lamoTRIgine (LAMICTAL) tablet 50 mg  50 mg Oral BID Sarina IllHerrick, Xayvier Vallez Edward, DO   50 mg at 05/21/21 0920  ? LORazepam (ATIVAN) tablet 1 mg  1 mg Oral Q4H PRN Sarina IllHerrick, Ilias Stcharles Edward, DO   1 mg at 05/05/21 [id number]  ? losartan (COZAAR) tablet 100 mg  100 mg Oral Daily Sarina IllHerrick, Jewelianna Pancoast Edward, DO   100 mg at 05/21/21 [id number]  ? magnesium hydroxide (MILK OF MAGNESIA) suspension 30 mL  30 mL Oral Daily PRN Vanetta MuldersBarthold, Louise F, NP      ? QUEtiapine (SEROQUEL)  tablet 400 mg  400 mg Oral QHS Sarina IllHerrick, Arvid Marengo Edward, DO   400 mg at 05/20/21 2113  ? rosuvastatin (CRESTOR) tablet 10 mg  10 mg Oral Daily Sarina IllHerrick, Jafar Poffenberger Edward, DO   10 mg at 05/21/21 0920  ? traZODone (DESYREL) tablet 100 mg  100 mg Oral QHS PRN Sarina IllHerrick, Siomara Burkel Edward, DO   100 mg at 05/14/21 2207  ? tuberculin injection 5 Units  5 Units Intradermal Once Sarina IllHerrick, Miyana Mordecai Edward, DO   5 Units at 05/20/21 1030  ? ? ?Lab Results: No results found for this or any previous visit (from the past 48 hour(s)). ? ?Blood Alcohol level:  ?Lab Results  ?Component Value Date  ? ETH <10 04/28/2021  ? ETH <10 03/13/2020  ? ? ?Metabolic Disorder Labs: ?Lab Results  ?Component Value Date  ? HGBA1C 5.7 (H) 05/06/2021  ? MPG 117 05/06/2021  ? MPG 105.41 04/28/2021  ? ?No results found for: PROLACTIN ?Lab Results  ?Component Value Date  ? CHOL 103 05/06/2021  ? TRIG 66 05/06/2021  ? HDL 41 05/06/2021  ? CHOLHDL 2.5 05/06/2021  ? VLDL 13 05/06/2021  ? LDLCALC 49 05/06/2021  ? LDLCALC 40 03/15/2020  ? ? ?Physical Findings: ?AIMS:  , ,  ,  ,    ?CIWA:    ?COWS:    ? ?Musculoskeletal: ?Strength & Muscle Tone: within normal limits ?Gait & Station: unsteady ?Patient leans: N/A ? ?Psychiatric Specialty Exam: ? ?Presentation  ?General Appearance: Disheveled ? ?Eye Contact:Fair ? ?Speech:Slow ? ?Speech Volume:Decreased ? ?Handedness:Right ? ? ?Mood and Affect  ?Mood:Angry; Anxious; Irritable ? ?Affect:Restricted ? ? ?Thought Process  ?Thought Processes:Disorganized ? ?Descriptions of Associations:Loose ? ?Orientation:Partial ? ?Thought Content:Delusions; Illogical; Paranoid Ideation; Perseveration; Scattered ? ?History of Schizophrenia/Schizoaffective disorder:No ? ?Duration of Psychotic Symptoms:Greater than six months ? ?Hallucinations:No data recorded ?Ideas of Reference:Paranoia ? ?Suicidal Thoughts:No data recorded ?Homicidal Thoughts:No data recorded ? ?Sensorium  ?Memory:Immediate Poor; Recent Poor; Remote  Poor ? ?Judgment:Impaired ? ?Insight:Poor ? ? ?Executive Functions  ?Concentration:Poor ? ?Attention Span:Poor ? ?Recall:Poor ? ?Fund of Knowledge:Poor ? ?Language:Poor ? ? ?Psychomotor Activity  ?Psychomotor Activity:No data recorded ? ?Assets  ?Assets:Resilience ? ? ?Sleep  ?Sleep:No data recorded ? ? ?Physical Exam: ?Physical Exam ?Vitals and nursing note reviewed.  ?Constitutional:   ?   Appearance: Normal appearance. He is normal weight.  ?Neurological:  ?   General: No focal deficit present.  ?   Mental Status: He is alert and oriented to person, place, and time.  ?Psychiatric:     ?  Attention and Perception: Attention and perception normal.     ?   Mood and Affect: Mood normal. Affect is labile.     ?   Speech: Speech normal.     ?   Behavior: Behavior normal. Behavior is cooperative.     ?   Thought Content: Thought content is delusional.     ?   Cognition and Memory: Cognition and memory normal.     ?   Judgment: Judgment is impulsive and inappropriate.  ? ?Review of Systems  ?Constitutional: Negative.   ?HENT: Negative.    ?Eyes: Negative.   ?Respiratory: Negative.    ?Cardiovascular: Negative.   ?Gastrointestinal: Negative.   ?Genitourinary: Negative.   ?Musculoskeletal: Negative.   ?Skin: Negative.   ?Neurological: Negative.   ?Endo/Heme/Allergies: Negative.   ?Psychiatric/Behavioral:  The patient is nervous/anxious.   ?Blood pressure 121/81, pulse 60, temperature (!) 97.5 ?F (36.4 ?C), temperature source Oral, resp. rate 18, height 5\' 8"  (1.727 m), weight 76.2 kg, SpO2 (!) 82 %. Body mass index is 25.54 kg/m?. ? ? ?Treatment Plan Summary: ?Daily contact with patient to assess and evaluate symptoms and progress in treatment, Medication management, and Plan awaiting evaluation by assisted living.  Continue current medications. ? ? , DO ?05/21/2021, 12:07 PM ? ?

## 2021-05-21 NOTE — Progress Notes (Signed)
Recreation Therapy Notes ? ?Date: 05/21/2021 ?  ?Time: 1:35 pm  ?  ?Location: Craft room    ?  ?Behavioral response: Appropriate ?  ?Intervention Topic: Self-esteem   ?  ?Discussion/Intervention:  ?Group content today was focused on self-esteem. Patient defined self-esteem and where it comes form. The group described reasons self-esteem is important. Individuals stated things that impact self-esteem and positive ways to improve self-esteem. The group participated in the intervention ?Collage of Me? where patients were able to create a collage of positive things that makes them who they are. ?Clinical Observations/Feedback: ?Patient came to group and stated that self-esteem comes from inner confidence. He explained that being detailed is what helps his self-esteem. Individual was social with peers and staff while participating in the intervention.    ?[name] LRT/CTRS  ?  ? ? ? ? ? ? ? ?Austin Rhodes ?05/21/2021 3:06 PM ?

## 2021-05-21 NOTE — Plan of Care (Signed)
Patient remain alert and oriented, calm and cooperative during assessment. Denies pain or discomfort. Denies anxiety, depression, SI, HI, and AVH. Ate meals in the day room among peers with good appetite. Compliant with due medications. Remain safe on the unit with Q15 minute safety checks.  ? ?Problem: Education: ?Goal: Knowledge of [facility] Education information/materials will improve ?Outcome: Progressing ?Goal: Mental status will improve ?Outcome: Progressing ?  ?Problem: Health Behavior/Discharge Planning: ?Goal: Identification of resources available to assist in meeting health care needs will improve ?Outcome: Progressing ?Goal: Compliance with treatment plan for underlying cause of condition will improve ?Outcome: Progressing ?  ?Problem: Safety: ?Goal: Periods of time without injury will increase ?Outcome: Progressing ?  ?Problem: Education: ?Goal: Knowledge of General Education information will improve ?Description: Including pain rating scale, medication(s)/side effects and non-pharmacologic comfort measures ?Outcome: Progressing ?  ?Problem: Health Behavior/Discharge Planning: ?Goal: Ability to manage health-related needs will improve ?Outcome: Progressing ?  ?Problem: Clinical Measurements: ?Goal: Ability to maintain clinical measurements within normal limits will improve ?Outcome: Progressing ?Goal: Will remain free from infection ?Outcome: Progressing ?Goal: Diagnostic test results will improve ?Outcome: Progressing ?Goal: Respiratory complications will improve ?Outcome: Progressing ?Goal: Cardiovascular complication will be avoided ?Outcome: Progressing ?  ?Problem: Activity: ?Goal: Risk for activity intolerance will decrease ?Outcome: Progressing ?  ?Problem: Nutrition: ?Goal: Adequate nutrition will be maintained ?Outcome: Progressing ?  ?Problem: Coping: ?Goal: Level of anxiety will decrease ?Outcome: Progressing ?  ?Problem: Elimination: ?Goal: Will not experience complications related  to bowel motility ?Outcome: Progressing ?Goal: Will not experience complications related to urinary retention ?Outcome: Progressing ?  ?Problem: Pain Managment: ?Goal: General experience of comfort will improve ?Outcome: Progressing ?  ?Problem: Safety: ?Goal: Ability to remain free from injury will improve ?Outcome: Progressing ?  ?Problem: Skin Integrity: ?Goal: Risk for impaired skin integrity will decrease ?Outcome: Progressing ?  ?

## 2021-05-22 DIAGNOSIS — F312 Bipolar disorder, current episode manic severe with psychotic features: Secondary | ICD-10-CM | POA: Diagnosis not present

## 2021-05-22 MED ORDER — DIVALPROEX SODIUM ER 500 MG PO TB24
1000.0000 mg | ORAL_TABLET | Freq: Every day | ORAL | Status: DC
  Administered 2021-05-22 – 2021-06-01 (×11): 1000 mg via ORAL
  Filled 2021-05-22 (×12): qty 2

## 2021-05-22 NOTE — Progress Notes (Signed)
Recreation Therapy Notes ? ?Date: 05/22/2021  ?  ?Time: 1:30pm  ?  ?Location: Court yard  ?  ?Behavioral response: Appropriate ?  ?Intervention Topic:  Social Skills  ?  ?Discussion/Intervention:  ?Group content on today was focused on social skills. The group defined social skills and identified ways they use social skills. Patients expressed what obstacles they face when trying to be social. Participants described the importance of social skills. The group listed ways to improve social skills and reasons to improve social skills. Individuals had an opportunity to learn new and improve social skills as well as identify their weaknesses. ?  ?Clinical Observations/Feedback: ?Patient came to group and expressed past experiences with socializing. Individual was social with peers and staff while participating in the intervention.   ?[name] LRT/CTRS  ?  ? ? ? ? ? ? ? ?[name] ?05/22/2021 4:08 PM ?

## 2021-05-22 NOTE — Plan of Care (Signed)
?  Problem: Education: ?Goal: Knowledge of [place] [facility] Education information/materials will improve ?Outcome: Progressing ?Goal: Mental status will improve ?Outcome: Progressing ?  ?Problem: Health Behavior/Discharge Planning: ?Goal: Identification of resources available to assist in meeting health care needs will improve ?Outcome: Progressing ?Goal: Compliance with treatment plan for underlying cause of condition will improve ?Outcome: Progressing ?  ?Problem: Safety: ?Goal: Periods of time without injury will increase ?Outcome: Progressing ?  ?Problem: Education: ?Goal: Knowledge of General Education information will improve ?Description: Including pain rating scale, medication(s)/side effects and non-pharmacologic comfort measures ?Outcome: Progressing ?  ?Problem: Health Behavior/Discharge Planning: ?Goal: Ability to manage health-related needs will improve ?Outcome: Progressing ?  ?Problem: Clinical Measurements: ?Goal: Ability to maintain clinical measurements within normal limits will improve ?Outcome: Progressing ?Goal: Will remain free from infection ?Outcome: Progressing ?Goal: Diagnostic test results will improve ?Outcome: Progressing ?Goal: Respiratory complications will improve ?Outcome: Progressing ?Goal: Cardiovascular complication will be avoided ?Outcome: Progressing ?  ?Problem: Activity: ?Goal: Risk for activity intolerance will decrease ?Outcome: Progressing ?  ?Problem: Nutrition: ?Goal: Adequate nutrition will be maintained ?Outcome: Progressing ?  ?Problem: Coping: ?Goal: Level of anxiety will decrease ?Outcome: Progressing ?  ?Problem: Elimination: ?Goal: Will not experience complications related to bowel motility ?Outcome: Progressing ?Goal: Will not experience complications related to urinary retention ?Outcome: Progressing ?  ?Problem: Pain Managment: ?Goal: General experience of comfort will improve ?Outcome: Progressing ?  ?Problem: Safety: ?Goal: Ability to remain free from  injury will improve ?Outcome: Progressing ?  ?Problem: Skin Integrity: ?Goal: Risk for impaired skin integrity will decrease ?Outcome: Progressing ?  ?

## 2021-05-22 NOTE — BHH Counselor (Signed)
CSW contacted pt's daughter, Mung Houge, [phone], regarding discharge planning. She stated that she spoke with [facility] regarding placement. She stated that they informed her that her father was accepted at the facility and that they were waiting on getting final documents together.  ?CSW informed Gregary Signs that forms for pt regarding FL2 and progress notes have been sent to facility. PPD results will be sent once ready.  ? ?No other requests were made. Conversation ended without incident.  ? ?Stacey Maura [place], MSW, LCSW-A ?4/20/20234:33 PM  ?

## 2021-05-22 NOTE — Group Note (Signed)
[facility] LCSW Group Therapy Note ? ? ?Group Date: 05/22/2021 ?Start Time: 5003 ?End Time: 7048 ? ? ?Type of Therapy/Topic:  Group Therapy:  Balance in Life ? ?Participation Level:  Minimal  ? ?Description of Group:   ? This group will address the concept of balance and how it feels and looks when one is unbalanced. Patients will be encouraged to process areas in their lives that are out of balance, and identify reasons for remaining unbalanced. Facilitators will guide patients utilizing problem- solving interventions to address and correct the stressor making their life unbalanced. Understanding and applying boundaries will be explored and addressed for obtaining  and maintaining a balanced life. Patients will be encouraged to explore ways to assertively make their unbalanced needs known to significant others in their lives, using other group members and facilitator for support and feedback. ? ?Therapeutic Goals: ?Patient will identify two or more emotions or situations they have that consume much of in their lives. ?Patient will identify signs/triggers that life has become out of balance:  ?Patient will identify two ways to set boundaries in order to achieve balance in their lives:  ?Patient will demonstrate ability to communicate their needs through discussion and/or role plays ? ?Summary of Patient Progress: ? ? ? ?Patient was present for the entirety of the group session. Patient was an active listener and participated minimally in the topic of discussion. CSW met with patients in the courtyard to facilitate discussion. Patient was observed sitting in the grass with appropriate demeanor.  ? ? ? ? ?Therapeutic Modalities:   ?Cognitive Behavioral Therapy ?Solution-Focused Therapy ?Assertiveness Training ? ? ?Candise Crabtree A [place], LCSWA ?

## 2021-05-22 NOTE — Progress Notes (Signed)
[facility] ? ?05/22/2021 11:31 AM ?Austin ReedyMaurice [name]  ?MRN:  [id number] ?Subjective: Austin AlstromMaurice has been compliant with his medication.  He denies any side effects.  He has been interacting more appropriately with staff and peers.  He thinks that the Depakote is making his stomach upset and he is waking up at night.  I told him that I could decrease it.  Still awaiting evaluation from assisted living. ? ?Principal Problem: Bipolar I disorder, current or most recent episode manic, with psychotic features (HCC) ?Diagnosis: Principal Problem: ?  Bipolar I disorder, current or most recent episode manic, with psychotic features (HCC) ?Active Problems: ?  Severe manic bipolar 1 disorder with psychotic behavior (HCC) ?  Bipolar 1 disorder (HCC) ? ?Total Time spent with patient: 15 minutes ? ?[facility]  ? ?Past Medical History:  ?Past Medical History:  ?Diagnosis Date  ? Bipolar disorder (HCC)   ? Coronary artery disease   ? GERD (gastroesophageal reflux disease)   ? History of multiple strokes   ? PTSD (post-traumatic stress disorder)   ?  ?Past Surgical History:  ?Procedure Laterality Date  ? CARDIAC CATHETERIZATION    ? ?Family History: History reviewed. No pertinent family history. ? ?Social History:  ?Social History  ? ?Substance and Sexual Activity  ?Alcohol Use No  ?   ?Social History  ? ?Substance and Sexual Activity  ?Drug Use Never  ?  ?Social History  ? ?Socioeconomic History  ? Marital status: Divorced  ?  Spouse name: Not on file  ? Number of children: Not on file  ? Years of education: Not on file  ? Highest education level: Not on file  ?Occupational History  ? Not on file  ?Tobacco Use  ? Smoking status: Never  ? Smokeless tobacco: Never  ?Vaping Use  ? Vaping Use: Never used  ?Substance and Sexual Activity  ? Alcohol use: No  ? Drug use: Never  ? Sexual activity: Not on file  ?Other Topics Concern  ? Not on file  ?Social History Narrative  ?  Not on file  ? ?Social Determinants of Health  ? ?Financial Resource Strain: Not on file  ?Food Insecurity: Not on file  ?Transportation Needs: Not on file  ?Physical Activity: Not on file  ?Stress: Not on file  ?Social Connections: Not on file  ? ?Additional Social History:  ?  ?  ?  ?  ?  ?  ?  ?  ?  ?  ?  ? ?Sleep: Poor ? ?Appetite:  Fair ? ?Current Medications: ?Current Facility-Administered Medications  ?Medication Dose Route Frequency Provider Last Rate Last Admin  ? acetaminophen (TYLENOL) tablet 650 mg  650 mg Oral Q6H PRN Sarina IllHerrick, Sahib Pella Edward, DO   650 mg at 05/01/21 1515  ? albuterol (PROVENTIL) (2.5 MG/3ML) 0.083% nebulizer solution 3 mL  3 mL Inhalation Q4H PRN Sarina IllHerrick, Mozes Sagar Edward, DO      ? alum & mag hydroxide-simeth (MAALOX/MYLANTA) 200-200-20 MG/5ML suspension 30 mL  30 mL Oral Q4H PRN Sarina IllHerrick, Filip Luten Edward, DO      ? chlorproMAZINE (THORAZINE) tablet 50 mg  50 mg Oral Q4H PRN Sarina IllHerrick, Greidy Sherard Edward, DO   50 mg at 05/13/21 2129  ? Or  ? chlorproMAZINE (THORAZINE) injection 50 mg  50 mg Intramuscular Q4H PRN Sarina IllHerrick, Lasaro Primm Edward, DO   50 mg at 05/01/21 [id number]  ? clopidogrel (PLAVIX) tablet 75 mg  75 mg Oral Daily Sarina IllHerrick, Cami Delawder Edward, DO  75 mg at 05/21/21 0919  ? divalproex (DEPAKOTE ER) 24 hr tablet 1,500 mg  1,500 mg Oral QHS Clapacs, John T, MD   1,500 mg at 05/21/21 2108  ? fluPHENAZine (PROLIXIN) tablet 2.5 mg  2.5 mg Oral Q breakfast Clapacs, Jackquline Denmark, MD   2.5 mg at 05/22/21 7253  ? fluPHENAZine (PROLIXIN) tablet 7.5 mg  7.5 mg Oral QHS Sarina Ill, DO   7.5 mg at 05/21/21 2106  ? lamoTRIgine (LAMICTAL) tablet 50 mg  50 mg Oral BID Sarina Ill, DO   50 mg at 05/21/21 2108  ? LORazepam (ATIVAN) tablet 1 mg  1 mg Oral Q4H PRN Sarina Ill, DO   1 mg at 05/05/21 6644  ? losartan (COZAAR) tablet 100 mg  100 mg Oral Daily Sarina Ill, DO   100 mg at 05/21/21 0347  ? magnesium hydroxide (MILK OF MAGNESIA) suspension 30 mL  30 mL Oral Daily  PRN Vanetta Mulders, NP      ? QUEtiapine (SEROQUEL) tablet 400 mg  400 mg Oral QHS Sarina Ill, DO   400 mg at 05/21/21 2108  ? rosuvastatin (CRESTOR) tablet 10 mg  10 mg Oral Daily Sarina Ill, DO   10 mg at 05/21/21 0920  ? traZODone (DESYREL) tablet 100 mg  100 mg Oral QHS PRN Sarina Ill, DO   100 mg at 05/14/21 2207  ? ? ?Lab Results: No results found for this or any previous visit (from the past 48 hour(s)). ? ?Blood Alcohol level:  ?Lab Results  ?Component Value Date  ? ETH <10 04/28/2021  ? ETH <10 03/13/2020  ? ? ?Metabolic Disorder Labs: ?Lab Results  ?Component Value Date  ? HGBA1C 5.7 (H) 05/06/2021  ? MPG 117 05/06/2021  ? MPG 105.41 04/28/2021  ? ?No results found for: PROLACTIN ?Lab Results  ?Component Value Date  ? CHOL 103 05/06/2021  ? TRIG 66 05/06/2021  ? HDL 41 05/06/2021  ? CHOLHDL 2.5 05/06/2021  ? VLDL 13 05/06/2021  ? LDLCALC 49 05/06/2021  ? LDLCALC 40 03/15/2020  ? ? ?Physical Findings: ?AIMS:  , ,  ,  ,    ?CIWA:    ?COWS:    ? ?Musculoskeletal: ?Strength & Muscle Tone: within normal limits ?Gait & Station: unsteady ?Patient leans: N/A ? ?Psychiatric Specialty Exam: ? ?Presentation  ?General Appearance: Disheveled ? ?Eye Contact:Fair ? ?Speech:Slow ? ?Speech Volume:Decreased ? ?Handedness:Right ? ? ?Mood and Affect  ?Mood:Angry; Anxious; Irritable ? ?Affect:Restricted ? ? ?Thought Process  ?Thought Processes:Disorganized ? ?Descriptions of Associations:Loose ? ?Orientation:Partial ? ?Thought Content:Delusions; Illogical; Paranoid Ideation; Perseveration; Scattered ? ?History of Schizophrenia/Schizoaffective disorder:No ? ?Duration of Psychotic Symptoms:Greater than six months ? ?Hallucinations:No data recorded ?Ideas of Reference:Paranoia ? ?Suicidal Thoughts:No data recorded ?Homicidal Thoughts:No data recorded ? ?Sensorium  ?Memory:Immediate Poor; Recent Poor; Remote Poor ? ?Judgment:Impaired ? ?Insight:Poor ? ? ?Executive Functions   ?Concentration:Poor ? ?Attention Span:Poor ? ?Recall:Poor ? ?Fund of Knowledge:Poor ? ?Language:Poor ? ? ?Psychomotor Activity  ?Psychomotor Activity:No data recorded ? ?Assets  ?Assets:Resilience ? ? ?Sleep  ?Sleep:No data recorded ? ? ?Physical Exam: ?Physical Exam ?Vitals and nursing note reviewed.  ?Constitutional:   ?   Appearance: Normal appearance. He is normal weight.  ?Neurological:  ?   General: No focal deficit present.  ?   Mental Status: He is alert and oriented to person, place, and time.  ?Psychiatric:     ?   Attention and Perception: Attention and perception normal.     ?  Mood and Affect: Mood normal. Affect is labile.     ?   Speech: Speech is tangential.     ?   Behavior: Behavior normal. Behavior is cooperative.     ?   Thought Content: Thought content is delusional.     ?   Cognition and Memory: Cognition and memory normal.     ?   Judgment: Judgment is impulsive and inappropriate.  ? ?Review of Systems  ?Constitutional: Negative.   ?HENT: Negative.    ?Eyes: Negative.   ?Respiratory: Negative.    ?Cardiovascular: Negative.   ?Gastrointestinal: Negative.   ?Genitourinary: Negative.   ?Musculoskeletal: Negative.   ?Skin: Negative.   ?Neurological: Negative.   ?Endo/Heme/Allergies: Negative.   ?Psychiatric/Behavioral:  The patient is nervous/anxious.   ?Blood pressure 138/83, pulse 73, temperature 98.1 ?F (36.7 ?C), temperature source Oral, resp. rate 15, height 5\' 8"  (1.727 m), weight 76.2 kg, SpO2 100 %. Body mass index is 25.54 kg/m?. ? ? ?Treatment Plan Summary: ?Daily contact with patient to assess and evaluate symptoms and progress in treatment, Medication management, and Plan decrease Depakote ER to 1000 mg at bedtime. ? ? , DO ?05/22/2021, 11:31 AM ? ?

## 2021-05-22 NOTE — Progress Notes (Signed)
Pt visible on the unit, but he is in his room most of the time. He reports he is tired due to he medications he is on. He will get up for groups when prompted. He denies SI/HI and AVH. His conversation is logical and coherent. He denies having anxiety and depression. He wants to talk to the doctor about his Depakote medication, because he took it last night and it made him feel "strange in the head", nauseous and restless all night. Refused his 10 am medications and he said he would take them after he eats lunch. Pt is awaiting a visit from the assist living facility. He attends groups and interacts with peers and staff. ? ?

## 2021-05-22 NOTE — Progress Notes (Signed)
Patient got up throughout the night. He came to the nurses station at 0420 stating he has been sick all night. He stated that it was due to the increase of his Depakote. Pt wants to speak to the doctor about this medication change. He is safe on the unit at this time; in no current distress. Q 15 minute safety checks in place.  ?

## 2021-05-22 NOTE — Progress Notes (Signed)
Md made aware of pt's complaints about his medications. ?

## 2021-05-22 NOTE — Progress Notes (Signed)
Patient is seen in the dayroom with visitor at the beginning of the shift. He is AAOx4. He denies anxiety, depression, SI, HI, and AVH. Pt still uses a front wheel walker. He is medication complaint. He is safe on the unit at this time. Q 15 minute safety checks in place.  ?

## 2021-05-23 DIAGNOSIS — F312 Bipolar disorder, current episode manic severe with psychotic features: Secondary | ICD-10-CM | POA: Diagnosis not present

## 2021-05-23 NOTE — [facility] Counselor (Signed)
CSW contacted Annice Pih from [employer], [phone] and discussed pending discharge date. She stated she would visit pt Monday around 10am. CSW will follow up next week regarding site visit and discharge to facility.  ? ?Cherise Fedder [place], MSW, LCSW-A ?4/21/202310:26 AM  ?

## 2021-05-23 NOTE — Progress Notes (Signed)
Patient is observed in dayroom with visitor. He is interacting appropriately on the unit. He denies pain, SI, HI, AVH, anxiety, and depression. He is AAOx4. He is medication compliant. He is safe on the unit at this time. Q 15 minute safety checks in place.  ?

## 2021-05-23 NOTE — Progress Notes (Signed)
Pt denies SI/HI and AVH. Pt has continues to have delusional thoughts and paranoid. No verbal reports of anxiety or depression. ?

## 2021-05-23 NOTE — Progress Notes (Signed)
Pt  in room most of the morning, lying in bed. I approached him to take his am medication and he was confused about having to take his medication. He stated, " I am not signing that 5,000 contract, no I am not going to do." RN presented reality and reoriented him to take his medication.  He is delusional and suspicious. He watched TV in the dayroom and attended the afternoon game activity. Pt ambulates well with walker. ?

## 2021-05-23 NOTE — Progress Notes (Signed)
Recreation Therapy Notes ? ?Date: 05/23/2021 ?  ?Time: 1:35pm   ?  ?Location: Dayroom  ?  ?Behavioral response: Appropriate ?  ?Intervention Topic: Communication   ?  ?Discussion/Intervention:  ?Group content today was focused on communication. The group defined communication and ways to communicate with others. Individuals stated reason why communication is important and some reasons to communicate with others. Patients expressed if they thought they were good at communicating with others and ways they could improve their communication skills. The group identified important parts of communication and some experiences they have had in the past with communication. The group participated in the intervention ?What is that??, where they had a chance to test out their communication skills and identify ways to improve their communication techniques. ?Clinical Observations/Feedback: ?Patient came to group and was focused and interacting with peers. Individual was social with peers and staff while participating in the intervention.    ?Lujuana Kapler LRT/CTRS  ? ? ? ? ? ? ? ?Kelan Pritt ?05/23/2021 2:32 PM ?

## 2021-05-23 NOTE — Progress Notes (Signed)
[facility] ? ?05/23/2021 10:43 AM ?Austin Rhodes  ?MRN:  [id number] ?Subjective: Austin Rhodes is seen on rounds today.  He continues to have fixed delusions but his behavior seems to be at baseline.  He has been accepted at assisted living but they are going to come in and see him on Monday.  He is taking his medications as prescribed and denies any side effects.  Depakote was bothering his stomach so we decreased it yesterday to 1000 mg at bedtime.  He states that decreasing it was helpful. ? ?Principal Problem: Bipolar I disorder, current or most recent episode manic, with psychotic features (HCC) ?Diagnosis: Principal Problem: ?  Bipolar I disorder, current or most recent episode manic, with psychotic features (HCC) ?Active Problems: ?  Severe manic bipolar 1 disorder with psychotic behavior (HCC) ?  Bipolar 1 disorder (HCC) ? ?Total Time spent with patient: 15 minutes ? ?Past [facility] History:  [facility]  ?  ? ?Past Medical History:  ?Past Medical History:  ?Diagnosis Date  ? Bipolar disorder (HCC)   ? Coronary artery disease   ? GERD (gastroesophageal reflux disease)   ? History of multiple strokes   ? PTSD (post-traumatic stress disorder)   ?  ?Past Surgical History:  ?Procedure Laterality Date  ? CARDIAC CATHETERIZATION    ? ?Family History: History reviewed. No pertinent family history. ? ?Social History:  ?Social History  ? ?Substance and Sexual Activity  ?Alcohol Use No  ?   ?Social History  ? ?Substance and Sexual Activity  ?Drug Use Never  ?  ?Social History  ? ?Socioeconomic History  ? Marital status: Divorced  ?  Spouse name: Not on file  ? Number of children: Not on file  ? Years of education: Not on file  ? Highest education level: Not on file  ?Occupational History  ? Not on file  ?Tobacco Use  ? Smoking status: Never  ? Smokeless tobacco: Never  ?Vaping Use  ? Vaping Use: Never used  ?Substance and Sexual Activity  ? Alcohol use: No  ? Drug use: Never  ?  Sexual activity: Not on file  ?Other Topics Concern  ? Not on file  ?Social History Narrative  ? Not on file  ? ?Social Determinants of Health  ? ?Financial Resource Strain: Not on file  ?Food Insecurity: Not on file  ?Transportation Needs: Not on file  ?Physical Activity: Not on file  ?Stress: Not on file  ?Social Connections: Not on file  ? ?Additional Social History:  ?  ?  ?  ?  ?  ?  ?  ?  ?  ?  ?  ? ?Sleep: Good ? ?Appetite:  Good ? ?Current Medications: ?Current Facility-Administered Medications  ?Medication Dose Route Frequency Provider Last Rate Last Admin  ? acetaminophen (TYLENOL) tablet 650 mg  650 mg Oral Q6H PRN Sarina IllHerrick, Kriya Westra Edward, DO   650 mg at 05/01/21 1515  ? albuterol (PROVENTIL) (2.5 MG/3ML) 0.083% nebulizer solution 3 mL  3 mL Inhalation Q4H PRN Sarina IllHerrick, Merie Wulf Edward, DO      ? alum & mag hydroxide-simeth (MAALOX/MYLANTA) 200-200-20 MG/5ML suspension 30 mL  30 mL Oral Q4H PRN Sarina IllHerrick, Pharrell Ledford Edward, DO      ? chlorproMAZINE (THORAZINE) tablet 50 mg  50 mg Oral Q4H PRN Sarina IllHerrick, Kiowa Hollar Edward, DO   50 mg at 05/13/21 2129  ? Or  ? chlorproMAZINE (THORAZINE) injection 50 mg  50 mg Intramuscular Q4H PRN Sarina IllHerrick, Temekia Caskey Edward, DO  50 mg at 05/01/21 9509  ? clopidogrel (PLAVIX) tablet 75 mg  75 mg Oral Daily Sarina Ill, DO   75 mg at 05/23/21 3267  ? divalproex (DEPAKOTE ER) 24 hr tablet 1,000 mg  1,000 mg Oral QHS Sarina Ill, DO   1,000 mg at 05/22/21 2105  ? fluPHENAZine (PROLIXIN) tablet 2.5 mg  2.5 mg Oral Q breakfast Clapacs, Jackquline Denmark, MD   2.5 mg at 05/23/21 1245  ? fluPHENAZine (PROLIXIN) tablet 7.5 mg  7.5 mg Oral QHS Sarina Ill, DO   7.5 mg at 05/22/21 2217  ? lamoTRIgine (LAMICTAL) tablet 50 mg  50 mg Oral BID Sarina Ill, DO   50 mg at 05/23/21 8099  ? LORazepam (ATIVAN) tablet 1 mg  1 mg Oral Q4H PRN Sarina Ill, DO   1 mg at 05/05/21 8338  ? losartan (COZAAR) tablet 100 mg  100 mg Oral Daily Sarina Ill,  DO   100 mg at 05/23/21 2505  ? magnesium hydroxide (MILK OF MAGNESIA) suspension 30 mL  30 mL Oral Daily PRN Vanetta Mulders, NP      ? QUEtiapine (SEROQUEL) tablet 400 mg  400 mg Oral QHS Sarina Ill, DO   400 mg at 05/22/21 2105  ? rosuvastatin (CRESTOR) tablet 10 mg  10 mg Oral Daily Sarina Ill, DO   10 mg at 05/23/21 3976  ? traZODone (DESYREL) tablet 100 mg  100 mg Oral QHS PRN Sarina Ill, DO   100 mg at 05/14/21 2207  ? ? ?Lab Results: No results found for this or any previous visit (from the past 48 hour(s)). ? ?Blood Alcohol level:  ?Lab Results  ?Component Value Date  ? ETH <10 04/28/2021  ? ETH <10 03/13/2020  ? ? ?Metabolic Disorder Labs: ?Lab Results  ?Component Value Date  ? HGBA1C 5.7 (H) 05/06/2021  ? MPG 117 05/06/2021  ? MPG 105.41 04/28/2021  ? ?No results found for: PROLACTIN ?Lab Results  ?Component Value Date  ? CHOL 103 05/06/2021  ? TRIG 66 05/06/2021  ? HDL 41 05/06/2021  ? CHOLHDL 2.5 05/06/2021  ? VLDL 13 05/06/2021  ? LDLCALC 49 05/06/2021  ? LDLCALC 40 03/15/2020  ? ? ?Physical Findings: ?AIMS:  , ,  ,  ,    ?CIWA:    ?COWS:    ? ?Musculoskeletal: ?Strength & Muscle Tone: within normal limits ?Gait & Station: normal ?Patient leans: N/A ? ?Psychiatric Specialty Exam: ? ?Presentation  ?General Appearance: Disheveled ? ?Eye Contact:Fair ? ?Speech:Slow ? ?Speech Volume:Decreased ? ?Handedness:Right ? ? ?Mood and Affect  ?Mood:Angry; Anxious; Irritable ? ?Affect:Restricted ? ? ?Thought Process  ?Thought Processes:Disorganized ? ?Descriptions of Associations:Loose ? ?Orientation:Partial ? ?Thought Content:Delusions; Illogical; Paranoid Ideation; Perseveration; Scattered ? ?History of Schizophrenia/Schizoaffective disorder:No ? ?Duration of Psychotic Symptoms:Greater than six months ? ?Hallucinations:No data recorded ?Ideas of Reference:Paranoia ? ?Suicidal Thoughts:No data recorded ?Homicidal Thoughts:No data recorded ? ?Sensorium  ?Memory:Immediate  Poor; Recent Poor; Remote Poor ? ?Judgment:Impaired ? ?Insight:Poor ? ? ?Executive Functions  ?Concentration:Poor ? ?Attention Span:Poor ? ?Recall:Poor ? ?Fund of Knowledge:Poor ? ?Language:Poor ? ? ?Psychomotor Activity  ?Psychomotor Activity:No data recorded ? ?Assets  ?Assets:Resilience ? ? ?Sleep  ?Sleep:No data recorded ? ? ?Physical Exam: ?Physical Exam ?Vitals and nursing note reviewed.  ?Constitutional:   ?   Appearance: Normal appearance. He is normal weight.  ?Neurological:  ?   General: No focal deficit present.  ?   Mental Status: He is alert and  oriented to person, place, and time.  ?Psychiatric:     ?   Attention and Perception: Attention and perception normal.     ?   Mood and Affect: Mood and affect normal.     ?   Speech: Speech is tangential.     ?   Behavior: Behavior normal. Behavior is cooperative.     ?   Thought Content: Thought content is paranoid and delusional.     ?   Cognition and Memory: Cognition and memory normal.     ?   Judgment: Judgment is impulsive and inappropriate.  ? ?Review of Systems  ?Constitutional: Negative.   ?HENT: Negative.    ?Eyes: Negative.   ?Respiratory: Negative.    ?Cardiovascular: Negative.   ?Gastrointestinal: Negative.   ?Genitourinary: Negative.   ?Musculoskeletal: Negative.   ?Skin: Negative.   ?Neurological: Negative.   ?Endo/Heme/Allergies: Negative.   ?Psychiatric/Behavioral: Negative.    ?Blood pressure 138/83, pulse 73, temperature 98.1 ?F (36.7 ?C), temperature source Oral, resp. rate 15, height 5\' 8"  (1.727 m), weight 76.2 kg, SpO2 100 %. Body mass index is 25.54 kg/m?. ? ? ?Treatment Plan Summary: ?Daily contact with patient to assess and evaluate symptoms and progress in treatment, Medication management, and Plan continue current medications.  Assisted living to come out on Monday. ? ?Monday, DO ?05/23/2021, 10:43 AM ? ?

## 2021-05-24 DIAGNOSIS — F312 Bipolar disorder, current episode manic severe with psychotic features: Secondary | ICD-10-CM | POA: Diagnosis not present

## 2021-05-24 NOTE — Progress Notes (Signed)
[facility] MD Progress Note ? ?05/24/2021 2:07 PM ?Austin Rhodes  ?MRN:  [id number] ?Subjective:  ?Patient is cheerful today.  Denies any concerns.  Denies depression anxiety or irritability.  Sleeping and eating well.  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  He speaks expansively about this.  No agitation or hostility on the unit. No new medical problems today. ? ?Principal Problem: Bipolar I disorder, current or most recent episode manic, with psychotic features (HCC) ?Diagnosis: Principal Problem: ?  Bipolar I disorder, current or most recent episode manic, with psychotic features (HCC) ?Active Problems: ?  Severe manic bipolar 1 disorder with psychotic behavior (HCC) ?  Bipolar 1 disorder (HCC) ? ?Total Time spent with patient: 15 minutes ? ?Past Psychiatric History:  [employer]  ?  ? ?Past Medical History:  ?Past Medical History:  ?Diagnosis Date  ? Bipolar disorder (HCC)   ? Coronary artery disease   ? GERD (gastroesophageal reflux disease)   ? History of multiple strokes   ? PTSD (post-traumatic stress disorder)   ?  ?Past Surgical History:  ?Procedure Laterality Date  ? CARDIAC CATHETERIZATION    ? ?Family History: History reviewed. No pertinent family history. ? ?Social History:  ?Social History  ? ?Substance and Sexual Activity  ?Alcohol Use No  ?   ?Social History  ? ?Substance and Sexual Activity  ?Drug Use Never  ?  ?Social History  ? ?Socioeconomic History  ? Marital status: Divorced  ?  Spouse name: Not on file  ? Number of children: Not on file  ? Years of education: Not on file  ? Highest education level: Not on file  ?Occupational History  ? Not on file  ?Tobacco Use  ? Smoking status: Never  ? Smokeless tobacco: Never  ?Vaping Use  ? Vaping Use: Never used  ?Substance and Sexual Activity  ? Alcohol use: No  ? Drug use: Never  ? Sexual activity: Not on file  ?Other Topics Concern  ? Not on file  ?Social History Narrative   ? Not on file  ? ?Social Determinants of Health  ? ?Financial Resource Strain: Not on file  ?Food Insecurity: Not on file  ?Transportation Needs: Not on file  ?Physical Activity: Not on file  ?Stress: Not on file  ?Social Connections: Not on file  ? ?Additional Social History:  ?  ?  ?  ?  ?  ?  ?  ?  ?  ?  ?  ? ?Sleep: Good ? ?Appetite:  Good ? ?Current Medications: ?Current Facility-Administered Medications  ?Medication Dose Route Frequency Provider Last Rate Last Admin  ? acetaminophen (TYLENOL) tablet 650 mg  650 mg Oral Q6H PRN Sarina Ill, DO   650 mg at 05/01/21 1515  ? albuterol (PROVENTIL) (2.5 MG/3ML) 0.083% nebulizer solution 3 mL  3 mL Inhalation Q4H PRN Sarina Ill, DO      ? alum & mag hydroxide-simeth (MAALOX/MYLANTA) 200-200-20 MG/5ML suspension 30 mL  30 mL Oral Q4H PRN Sarina Ill, DO      ? chlorproMAZINE (THORAZINE) tablet 50 mg  50 mg Oral Q4H PRN Sarina Ill, DO   50 mg at 05/13/21 2129  ? Or  ? chlorproMAZINE (THORAZINE) injection 50 mg  50 mg Intramuscular Q4H PRN Sarina Ill, DO   50 mg at 05/01/21 8828  ? clopidogrel (PLAVIX) tablet 75 mg  75 mg Oral Daily Sarina Ill, DO  75 mg at 05/24/21 1010  ? divalproex (DEPAKOTE ER) 24 hr tablet 1,000 mg  1,000 mg Oral QHS Parks Ranger, DO   1,000 mg at 05/23/21 2154  ? fluPHENAZine (PROLIXIN) tablet 2.5 mg  2.5 mg Oral Q breakfast Clapacs, Madie Reno, MD   2.5 mg at 05/24/21 [id number]  ? fluPHENAZine (PROLIXIN) tablet 7.5 mg  7.5 mg Oral QHS Parks Ranger, DO   7.5 mg at 05/23/21 2152  ? lamoTRIgine (LAMICTAL) tablet 50 mg  50 mg Oral BID Parks Ranger, DO   50 mg at 05/24/21 1010  ? LORazepam (ATIVAN) tablet 1 mg  1 mg Oral Q4H PRN Parks Ranger, DO   1 mg at 05/05/21 [id number]  ? losartan (COZAAR) tablet 100 mg  100 mg Oral Daily Parks Ranger, DO   100 mg at 05/24/21 1010  ? magnesium hydroxide (MILK OF MAGNESIA) suspension 30 mL  30 mL  Oral Daily PRN Sherlon Handing, NP      ? QUEtiapine (SEROQUEL) tablet 400 mg  400 mg Oral QHS Parks Ranger, DO   400 mg at 05/23/21 2155  ? rosuvastatin (CRESTOR) tablet 10 mg  10 mg Oral Daily Parks Ranger, DO   10 mg at 05/24/21 1010  ? traZODone (DESYREL) tablet 100 mg  100 mg Oral QHS PRN Parks Ranger, DO   100 mg at 05/14/21 2207  ? ? ?Lab Results: No results found for this or any previous visit (from the past 48 hour(s)). ? ?Blood Alcohol level:  ?Lab Results  ?Component Value Date  ? ETH <10 04/28/2021  ? ETH <10 03/13/2020  ? ? ?Metabolic Disorder Labs: ?Lab Results  ?Component Value Date  ? HGBA1C 5.7 (H) 05/06/2021  ? MPG 117 05/06/2021  ? MPG 105.41 04/28/2021  ? ?No results found for: PROLACTIN ?Lab Results  ?Component Value Date  ? CHOL 103 05/06/2021  ? TRIG 66 05/06/2021  ? HDL 41 05/06/2021  ? CHOLHDL 2.5 05/06/2021  ? VLDL 13 05/06/2021  ? LDLCALC 49 05/06/2021  ? [place] 40 03/15/2020  ? ? ?Physical Findings: ?AIMS:  , ,  ,  ,    ?CIWA:    ?COWS:    ? ?Musculoskeletal: ?Strength & Muscle Tone: within normal limits ?Gait & Station: normal ?Patient leans: N/A ? ?Psychiatric Specialty Exam: ? ?Presentation  ?General Appearance: Disheveled ? ?Eye Contact:Fair ? ?Speech:Slow ? ?Speech Volume:Decreased ? ?Handedness:Right ? ? ?Mood and Affect  ?Mood:Angry; Anxious; Irritable ? ?Affect:Restricted ? ? ?Thought Process  ?Thought Processes:Disorganized ? ?Descriptions of Associations:Loose ? ?Orientation:Partial ? ?Thought Content:Delusions; Illogical; Paranoid Ideation; Perseveration; Scattered ? ?History of Schizophrenia/Schizoaffective disorder:No ? ?Duration of Psychotic Symptoms:Greater than six months ? ?Hallucinations:No data recorded ?Ideas of Reference:Paranoia ? ?Suicidal Thoughts:No data recorded ?Homicidal Thoughts:No data recorded ? ?Sensorium  ?Memory:Immediate Poor; Recent Poor; Remote Poor ? ?Judgment:Impaired ? ?Insight:Poor ? ? ?Executive Functions   ?Concentration:Poor ? ?Attention Span:Poor ? ?Recall:Poor ? ?Fund of Knowledge:Poor ? ?Language:Poor ? ? ?Psychomotor Activity  ?Psychomotor Activity:No data recorded ? ?Assets  ?Assets:Resilience ? ? ?Sleep  ?Sleep:No data recorded ? ? ?Physical Exam: ?Physical Exam ?Vitals and nursing note reviewed.  ?Constitutional:   ?   Appearance: Normal appearance. He is normal weight.  ?Neurological:  ?   General: No focal deficit present.  ?   Mental Status: He is alert and oriented to person, place, and time.  ?Psychiatric:     ?   Attention and Perception: Attention and perception normal.     ?  Mood and Affect: Mood and affect normal.     ?   Speech: Speech is tangential.     ?   Behavior: Behavior normal. Behavior is cooperative.     ?   Thought Content: Thought content is paranoid and delusional.     ?   Cognition and Memory: Cognition and memory normal.     ?   Judgment: Judgment is impulsive and inappropriate.  ? ?Review of Systems  ?Constitutional: Negative.   ?HENT: Negative.    ?Eyes: Negative.   ?Respiratory: Negative.    ?Cardiovascular: Negative.   ?Gastrointestinal: Negative.   ?Genitourinary: Negative.   ?Musculoskeletal: Negative.   ?Skin: Negative.   ?Neurological: Negative.   ?Endo/Heme/Allergies: Negative.   ?Psychiatric/Behavioral: Negative.    ?Blood pressure (!) 122/49, pulse 71, temperature 97.7 ?F (36.5 ?C), temperature source Oral, resp. rate 18, height 5\' 8"  (1.727 m), weight 76.2 kg, SpO2 100 %. Body mass index is 25.54 kg/m?. ? ? ?Treatment Plan Summary: ?Daily contact with patient to assess and evaluate symptoms and progress in treatment, Medication management, and Plan continue current medications.  Assisted living to come out on Monday. ? ?4/22 ?No changes ? ?Rulon Sera, MD ?05/24/2021, 2:07 PM ? ?

## 2021-05-24 NOTE — Plan of Care (Signed)
Patient Calm and Cooperative on unit during shift.  Patient denies SI/HI/Avh and contracts for safety.  Patient presents Isolative with minimal participation on therapeutic milieu. Patient did comply with scheduled medications with encouragement. Patient denies Anxiety and Depression. Encouragement given to comply with treatment plan. Q 15 minute safety checks in place.  ? ? ?Problem: Education: ?Goal: Knowledge of [place] [facility] Education information/materials will improve ?Outcome: Progressing ?Goal: Mental status will improve ?Outcome: Progressing ?  ?Problem: Health Behavior/Discharge Planning: ?Goal: Identification of resources available to assist in meeting health care needs will improve ?Outcome: Progressing ?  ?

## 2021-05-24 NOTE — Group Note (Signed)
[facility] LCSW Group Therapy Note ? ? ? ?Group Date: 05/24/2021 ?Start Time: 1430 ?End Time: 1530 ? ?Type of Therapy and Topic:  Group Therapy:  Overcoming Obstacles ? ?Participation Level:  [facility] PARTICIPATION LEVEL: Minimal ? ? ?Description of Group:   ?In this group patients will be encouraged to explore what they see as obstacles to their own wellness and recovery. They will be guided to discuss their thoughts, feelings, and behaviors related to these obstacles. The group will process together ways to cope with barriers, with attention given to specific choices patients can make. Each patient will be challenged to identify changes they are motivated to make in order to overcome their obstacles. This group will be process-oriented, with patients participating in exploration of their own experiences as well as giving and receiving support and challenge from other group members. ? ?Therapeutic Goals: ?1. Patient will identify personal and current obstacles as they relate to admission. ?2. Patient will identify barriers that currently interfere with their wellness or overcoming obstacles.  ?3. Patient will identify feelings, thought process and behaviors related to these barriers. ?4. Patient will identify two changes they are willing to make to overcome these obstacles:  ? ? ?Summary of Patient Progress ? ?Patient was present for the entirety of the group session. Patient was an active listener and participated in the topic of discussion, provided helpful advice to others, and added nuance to topic of conversation. CSW led discussion doing a worksheet for change activity pertaining to overcoming obstacles. He stated that his goal was to get his walker fixed because it had been bothering him for a few days.  ? ?Therapeutic Modalities:   ?Cognitive Behavioral Therapy ?Solution Focused Therapy ?Motivational Interviewing ?Relapse Prevention Therapy ? ? ?Austin Rhodes A [place], LCSWA ?

## 2021-05-24 NOTE — Progress Notes (Signed)
Patient alert and oriented x3. Patient is very calm, cooperative and pleasant. Patient denies SI,HI,AVH. Patient denies depression and anxiety. Patient contracts for safety. Patient complaint with medication. Patient  spends more time in his room. Will continue to monitor with Q 15 minutes safety checks per unit protocol. ? ?

## 2021-05-25 DIAGNOSIS — F312 Bipolar disorder, current episode manic severe with psychotic features: Secondary | ICD-10-CM | POA: Diagnosis not present

## 2021-05-25 NOTE — Progress Notes (Signed)
Pt up in the hallway x2, then back to his room and in bed. He denies SI/HI and AVH. He stated he wanted his feet checked by a doctor, due to his toe nails turning yellow on the ft foot. He said his feet were swollen a little as well He denies anxiety and depression. Per the pt he is supposed to be discharge this week. Pt ate his snack in his room. No interaction with peers and minimal with staff. ?

## 2021-05-25 NOTE — Plan of Care (Signed)
Pt Calm and Cooperative during shift.  Pt denies Si/HI/Avh or Depression.  Pt Flat and Isolative during shift.  Pt did have night snack.  Pt did comply with scheduled meds.  Q 15 minute safety checks in place. Pt informed to notify nursing staff if any needs arise.  Will continue to plan of care.  ? ? ?Problem: Education: ?Goal: Knowledge of [facility] Education information/materials will improve ?Outcome: Progressing ?Goal: Mental status will improve ?Outcome: Progressing ?  ?Problem: Health Behavior/Discharge Planning: ?Goal: Identification of resources available to assist in meeting health care needs will improve ?Outcome: Progressing ?  ?

## 2021-05-25 NOTE — Group Note (Signed)
LCSW Group Therapy Note ? ?Group Date: 05/25/2021 ?Start Time: 1300 ?End Time: 1400 ? ? ?Type of Therapy and Topic:  Group Therapy - How To Cope with Nervousness about Discharge  ? ?Participation Level:  Did Not Attend  ? ?Description of Group ?This process group involved identification of patients' feelings about discharge. Some of them are scheduled to be discharged soon, while others are new admissions, but each of them was asked to share thoughts and feelings surrounding discharge from the hospital. One common theme was that they are excited at the prospect of going home, while another was that many of them are apprehensive about sharing why they were hospitalized. Patients were given the opportunity to discuss these feelings with their peers in preparation for discharge. ? ?Therapeutic Goals ? ?Patient will identify their overall feelings about pending discharge. ?Patient will think about how they might proactively address issues that they believe will once again arise once they get home (i.e. with parents). ?Patients will participate in discussion about having hope for change. ? ? ?Summary of Patient Progress:   ?Patient did not attend group despite encouraged participation.  ? ? ?Therapeutic Modalities ?Cognitive Behavioral Therapy ? ? ?Atwood Adcock W Shakemia [place], LCSWA ?05/25/2021  2:38 PM   ?

## 2021-05-25 NOTE — BH IP Treatment Plan (Signed)
Interdisciplinary Treatment and Diagnostic Plan Update ? ?05/25/2021 ?Time of Session: [id number] ?Austin Rhodes ?MRN: [id number] ? ?Principal Diagnosis: Bipolar I disorder, current or most recent episode manic, with psychotic features ([place]) ? ?Secondary Diagnoses: Principal Problem: ?  Bipolar I disorder, current or most recent episode manic, with psychotic features ([place]) ?Active Problems: ?  Severe manic bipolar 1 disorder with psychotic behavior ([place]) ?  Bipolar 1 disorder ([place]) ? ? ?Current Medications:  ?Current Facility-Administered Medications  ?Medication Dose Route Frequency Provider Last Rate Last Admin  ? acetaminophen (TYLENOL) tablet 650 mg  650 mg Oral Q6H PRN Parks Ranger, DO   650 mg at 05/01/21 1515  ? albuterol (PROVENTIL) (2.5 MG/3ML) 0.083% nebulizer solution 3 mL  3 mL Inhalation Q4H PRN Parks Ranger, DO      ? alum & mag hydroxide-simeth (MAALOX/MYLANTA) 200-200-20 MG/5ML suspension 30 mL  30 mL Oral Q4H PRN Parks Ranger, DO      ? chlorproMAZINE (THORAZINE) tablet 50 mg  50 mg Oral Q4H PRN Parks Ranger, DO   50 mg at 05/13/21 2129  ? Or  ? chlorproMAZINE (THORAZINE) injection 50 mg  50 mg Intramuscular Q4H PRN Parks Ranger, DO   50 mg at 05/01/21 3545  ? clopidogrel (PLAVIX) tablet 75 mg  75 mg Oral Daily Parks Ranger, DO   75 mg at 05/24/21 1010  ? divalproex (DEPAKOTE ER) 24 hr tablet 1,000 mg  1,000 mg Oral QHS Parks Ranger, DO   1,000 mg at 05/24/21 2114  ? fluPHENAZine (PROLIXIN) tablet 2.5 mg  2.5 mg Oral Q breakfast Clapacs, Madie Reno, MD   2.5 mg at 05/24/21 6256  ? fluPHENAZine (PROLIXIN) tablet 7.5 mg  7.5 mg Oral QHS Parks Ranger, DO   7.5 mg at 05/24/21 2117  ? lamoTRIgine (LAMICTAL) tablet 50 mg  50 mg Oral BID Parks Ranger, DO   50 mg at 05/24/21 2114  ? LORazepam (ATIVAN) tablet 1 mg  1 mg Oral Q4H PRN Parks Ranger, DO   1 mg at 05/05/21 3893  ? losartan (COZAAR) tablet 100  mg  100 mg Oral Daily Parks Ranger, DO   100 mg at 05/24/21 1010  ? magnesium hydroxide (MILK OF MAGNESIA) suspension 30 mL  30 mL Oral Daily PRN Sherlon Handing, NP      ? QUEtiapine (SEROQUEL) tablet 400 mg  400 mg Oral QHS Parks Ranger, DO   400 mg at 05/24/21 2113  ? rosuvastatin (CRESTOR) tablet 10 mg  10 mg Oral Daily Parks Ranger, DO   10 mg at 05/24/21 1010  ? traZODone (DESYREL) tablet 100 mg  100 mg Oral QHS PRN Parks Ranger, DO   100 mg at 05/14/21 2207  ? ?PTA Medications: ?Medications Prior to Admission  ?Medication Sig Dispense Refill Last Dose  ? acetaminophen (TYLENOL) 325 MG tablet Take 2 tablets (650 mg total) by mouth every 6 (six) hours as needed for mild pain or moderate pain. 30 tablet 0   ? albuterol (VENTOLIN HFA) 108 (90 Base) MCG/ACT inhaler Inhale 1-2 puffs into the lungs every 4 (four) hours as needed for wheezing or shortness of breath. 1 each 0   ? amoxicillin-clavulanate (AUGMENTIN) 875-125 MG tablet Take 1 tablet by mouth 2 (two) times daily. X 7 days 14 tablet 0   ? clopidogrel (PLAVIX) 75 MG tablet Take 1 tablet (75 mg total) by mouth daily. 30 tablet 1   ?  divalproex (DEPAKOTE ER) 500 MG 24 hr tablet Take 500 mg by mouth 3 (three) times daily.     ? divalproex (DEPAKOTE) 500 MG DR tablet Take 1 tablet (500 mg total) by mouth every morning AND 2 tablets (1,000 mg total) at bedtime. 90 tablet 1   ? fluticasone (FLONASE) 50 MCG/ACT nasal spray Place 2 sprays into both nostrils daily. 16 g 0   ? hydrOXYzine (ATARAX/VISTARIL) 50 MG tablet Take 1 tablet (50 mg total) by mouth 3 (three) times daily as needed for anxiety. 30 tablet 1   ? lamoTRIgine (LAMICTAL) 25 MG tablet Take by mouth.     ? Lamotrigine 35 x 25 MG KIT Take 25-100 mg by mouth as directed. 1 kit 0   ? metoprolol succinate (TOPROL-XL) 25 MG 24 hr tablet Take 25 mg by mouth daily.     ? QUEtiapine (SEROQUEL) 400 MG tablet Take 1 tablet (400 mg total) by mouth at bedtime. 30  tablet 1   ? rosuvastatin (CRESTOR) 10 MG tablet Take 1 tablet (10 mg total) by mouth daily. 30 tablet 1   ? Spacer/Aero-Holding Chambers (AEROCHAMBER PLUS) inhaler Use with inhaler 1 each 2   ? tamsulosin (FLOMAX) 0.4 MG CAPS capsule Take 1 capsule (0.4 mg total) by mouth daily after supper. 30 capsule 1   ? ? ?Patient Stressors: Other: Unable to assess due to manic state   ? ?Patient Strengths: Active sense of humor  ?Communication skills  ? ?Treatment Modalities: Medication Management, Group therapy, Case management,  ?1 to 1 session with clinician, Psychoeducation, Recreational therapy. ? ? ?Physician Treatment Plan for Primary Diagnosis: Bipolar I disorder, current or most recent episode manic, with psychotic features ([place]) ?Long Term Goal(s): Improvement in symptoms so as ready for discharge  ? ?Short Term Goals: Ability to identify changes in lifestyle to reduce recurrence of condition will improve ?Ability to verbalize feelings will improve ?Ability to disclose and discuss suicidal ideas ?Ability to demonstrate self-control will improve ?Ability to identify and develop effective coping behaviors will improve ?Ability to maintain clinical measurements within normal limits will improve ?Compliance with prescribed medications will improve ?Ability to identify triggers associated with substance abuse/mental health issues will improve ? ?Medication Management: Evaluate patient's response, side effects, and tolerance of medication regimen. ? ?Therapeutic Interventions: 1 to 1 sessions, Unit Group sessions and Medication administration. ? ?Evaluation of Outcomes: Progressing ? ?Physician Treatment Plan for Secondary Diagnosis: Principal Problem: ?  Bipolar I disorder, current or most recent episode manic, with psychotic features ([place]) ?Active Problems: ?  Severe manic bipolar 1 disorder with psychotic behavior ([place]) ?  Bipolar 1 disorder ([place]) ? ?Long Term Goal(s): Improvement in symptoms so as ready for discharge   ? ?Short Term Goals: Ability to identify changes in lifestyle to reduce recurrence of condition will improve ?Ability to verbalize feelings will improve ?Ability to disclose and discuss suicidal ideas ?Ability to demonstrate self-control will improve ?Ability to identify and develop effective coping behaviors will improve ?Ability to maintain clinical measurements within normal limits will improve ?Compliance with prescribed medications will improve ?Ability to identify triggers associated with substance abuse/mental health issues will improve    ? ?Medication Management: Evaluate patient's response, side effects, and tolerance of medication regimen. ? ?Therapeutic Interventions: 1 to 1 sessions, Unit Group sessions and Medication administration. ? ?Evaluation of Outcomes: Progressing ? ? ?RN Treatment Plan for Primary Diagnosis: Bipolar I disorder, current or most recent episode manic, with psychotic features ([place]) ?Long Term Goal(s): Knowledge of  disease and therapeutic regimen to maintain health will improve ? ?Short Term Goals: Ability to remain free from injury will improve, Ability to verbalize frustration and anger appropriately will improve, Ability to demonstrate self-control, Ability to participate in decision making will improve, Ability to verbalize feelings will improve, Ability to disclose and discuss suicidal ideas, Ability to identify and develop effective coping behaviors will improve, and Compliance with prescribed medications will improve ? ?Medication Management: RN will administer medications as ordered by provider, will assess and evaluate patient's response and provide education to patient for prescribed medication. RN will report any adverse and/or side effects to prescribing provider. ? ?Therapeutic Interventions: 1 on 1 counseling sessions, Psychoeducation, Medication administration, Evaluate responses to treatment, Monitor vital signs and CBGs as ordered, Perform/monitor CIWA, COWS, AIMS  and Fall Risk screenings as ordered, Perform wound care treatments as ordered. ? ?Evaluation of Outcomes: Progressing ? ? ?LCSW Treatment Plan for Primary Diagnosis: Bipolar I disorder, current or most r

## 2021-05-25 NOTE — Progress Notes (Signed)
Pt visible in milieu at intervals during shift for medications and meals. Presents with flat affect, fair eye contact but brightens up and forwards on interactions. Denies SI, HI, AVH and pain when assessed. Observed to be preoccupied about further decline in his mobility "I'm just worried that I may not be able to walk without this walker. I couldn't play no ball yesterday or keep up with everybody outside. I really don't want to stay with this walker forever" and continues to verbalize his concerns on subsequent interactions.   ?Emotional support and reassurance provided to pt. Safety checks initiated at Q 15 minutes intervals without self harm gestures or outburst. Verbal educations provided on all medications and effects monitored. Pt tolerates all meals, fluids and medications well without discomfort.  ?

## 2021-05-25 NOTE — Plan of Care (Signed)
?  Problem: Education: ?Goal: Knowledge of [place] [facility] Education information/materials will improve ?Outcome: Progressing ?Goal: Mental status will improve ?Outcome: Progressing ?  ?Problem: Health Behavior/Discharge Planning: ?Goal: Identification of resources available to assist in meeting health care needs will improve ?Outcome: Progressing ?Goal: Compliance with treatment plan for underlying cause of condition will improve ?Outcome: Progressing ?  ?Problem: Safety: ?Goal: Periods of time without injury will increase ?Outcome: Progressing ?  ?Problem: Education: ?Goal: Knowledge of General Education information will improve ?Description: Including pain rating scale, medication(s)/side effects and non-pharmacologic comfort measures ?Outcome: Progressing ?  ?Problem: Health Behavior/Discharge Planning: ?Goal: Ability to manage health-related needs will improve ?Outcome: Progressing ?  ?Problem: Clinical Measurements: ?Goal: Ability to maintain clinical measurements within normal limits will improve ?Outcome: Progressing ?Goal: Will remain free from infection ?Outcome: Progressing ?Goal: Diagnostic test results will improve ?Outcome: Progressing ?Goal: Respiratory complications will improve ?Outcome: Progressing ?Goal: Cardiovascular complication will be avoided ?Outcome: Progressing ?  ?Problem: Activity: ?Goal: Risk for activity intolerance will decrease ?Outcome: Progressing ?  ?Problem: Nutrition: ?Goal: Adequate nutrition will be maintained ?Outcome: Progressing ?  ?Problem: Coping: ?Goal: Level of anxiety will decrease ?Outcome: Progressing ?  ?Problem: Elimination: ?Goal: Will not experience complications related to bowel motility ?Outcome: Progressing ?Goal: Will not experience complications related to urinary retention ?Outcome: Progressing ?  ?Problem: Pain Managment: ?Goal: General experience of comfort will improve ?Outcome: Progressing ?  ?Problem: Safety: ?Goal: Ability to remain free from  injury will improve ?Outcome: Progressing ?  ?Problem: Skin Integrity: ?Goal: Risk for impaired skin integrity will decrease ?Outcome: Progressing ?  ?

## 2021-05-25 NOTE — Progress Notes (Signed)
[facility] MD Progress Note ? ?05/25/2021 11:31 AM ?Austin Rhodes  ?MRN:  [id number] ?Subjective:  ? ?4/23 ?She is seen in his room today.  He is pleasant, calm, cooperative.  Denies subjective paranoia and hallucinations.  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  Denies hallucinations.  Appetite is robust, according to him.  He is not had any falls so far this weekend.  He is taking his medications without resistance. ? ?4/22 ?Patient is cheerful today.  Denies any concerns.  Denies depression anxiety or irritability.  Sleeping and eating well.  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  He speaks expansively about this.  No agitation or hostility on the unit. No new medical problems today. ? ?Principal Problem: Bipolar I disorder, current or most recent episode manic, with psychotic features (HCC) ?Diagnosis: Principal Problem: ?  Bipolar I disorder, current or most recent episode manic, with psychotic features (HCC) ?Active Problems: ?  Severe manic bipolar 1 disorder with psychotic behavior (HCC) ?  Bipolar 1 disorder (HCC) ? ?Total Time spent with patient: 18 minutes.  ? ?Past Psychiatric History:  [employer]  ?  ? ?Past Medical History:  ?Past Medical History:  ?Diagnosis Date  ? Bipolar disorder (HCC)   ? Coronary artery disease   ? GERD (gastroesophageal reflux disease)   ? History of multiple strokes   ? PTSD (post-traumatic stress disorder)   ?  ?Past Surgical History:  ?Procedure Laterality Date  ? CARDIAC CATHETERIZATION    ? ?Family History: History reviewed. No pertinent family history. ? ?Social History:  ?Social History  ? ?Substance and Sexual Activity  ?Alcohol Use No  ?   ?Social History  ? ?Substance and Sexual Activity  ?Drug Use Never  ?  ?Social History  ? ?Socioeconomic History  ? Marital status: Divorced  ?  Spouse name: Not on file  ? Number of children: Not on file  ? Years of education: Not on file  ?  Highest education level: Not on file  ?Occupational History  ? Not on file  ?Tobacco Use  ? Smoking status: Never  ? Smokeless tobacco: Never  ?Vaping Use  ? Vaping Use: Never used  ?Substance and Sexual Activity  ? Alcohol use: No  ? Drug use: Never  ? Sexual activity: Not on file  ?Other Topics Concern  ? Not on file  ?Social History Narrative  ? Not on file  ? ?Social Determinants of Health  ? ?Financial Resource Strain: Not on file  ?Food Insecurity: Not on file  ?Transportation Needs: Not on file  ?Physical Activity: Not on file  ?Stress: Not on file  ?Social Connections: Not on file  ? ?Additional Social History:  ?  ?  ?  ?  ?  ?  ?  ?  ?  ?  ?  ? ?Sleep: Good ? ?Appetite:  Good ? ?Current Medications: ?Current Facility-Administered Medications  ?Medication Dose Route Frequency Provider Last Rate Last Admin  ? acetaminophen (TYLENOL) tablet 650 mg  650 mg Oral Q6H PRN Sarina Ill, DO   650 mg at 05/01/21 1515  ? albuterol (PROVENTIL) (2.5 MG/3ML) 0.083% nebulizer solution 3 mL  3 mL Inhalation Q4H PRN Sarina Ill, DO      ? alum & mag hydroxide-simeth (MAALOX/MYLANTA) 200-200-20 MG/5ML suspension 30 mL  30 mL Oral Q4H PRN Sarina Ill, DO      ? chlorproMAZINE (THORAZINE) tablet  50 mg  50 mg Oral Q4H PRN Sarina Ill, DO   50 mg at 05/13/21 2129  ? Or  ? chlorproMAZINE (THORAZINE) injection 50 mg  50 mg Intramuscular Q4H PRN Sarina Ill, DO   50 mg at 05/01/21 5597  ? clopidogrel (PLAVIX) tablet 75 mg  75 mg Oral Daily Sarina Ill, DO   75 mg at 05/25/21 0913  ? divalproex (DEPAKOTE ER) 24 hr tablet 1,000 mg  1,000 mg Oral QHS Sarina Ill, DO   1,000 mg at 05/24/21 2114  ? fluPHENAZine (PROLIXIN) tablet 2.5 mg  2.5 mg Oral Q breakfast Clapacs, Jackquline Denmark, MD   2.5 mg at 05/25/21 0913  ? fluPHENAZine (PROLIXIN) tablet 7.5 mg  7.5 mg Oral QHS Sarina Ill, DO   7.5 mg at 05/24/21 2117  ? lamoTRIgine (LAMICTAL) tablet 50 mg   50 mg Oral BID Sarina Ill, DO   50 mg at 05/25/21 4163  ? LORazepam (ATIVAN) tablet 1 mg  1 mg Oral Q4H PRN Sarina Ill, DO   1 mg at 05/05/21 8453  ? losartan (COZAAR) tablet 100 mg  100 mg Oral Daily Sarina Ill, DO   100 mg at 05/25/21 6468  ? magnesium hydroxide (MILK OF MAGNESIA) suspension 30 mL  30 mL Oral Daily PRN Vanetta Mulders, NP      ? QUEtiapine (SEROQUEL) tablet 400 mg  400 mg Oral QHS Sarina Ill, DO   400 mg at 05/24/21 2113  ? rosuvastatin (CRESTOR) tablet 10 mg  10 mg Oral Daily Sarina Ill, DO   10 mg at 05/25/21 0321  ? traZODone (DESYREL) tablet 100 mg  100 mg Oral QHS PRN Sarina Ill, DO   100 mg at 05/14/21 2207  ? ? ?Lab Results: No results found for this or any previous visit (from the past 48 hour(s)). ? ?Blood Alcohol level:  ?Lab Results  ?Component Value Date  ? ETH <10 04/28/2021  ? ETH <10 03/13/2020  ? ? ?Metabolic Disorder Labs: ?Lab Results  ?Component Value Date  ? HGBA1C 5.7 (H) 05/06/2021  ? MPG 117 05/06/2021  ? MPG 105.41 04/28/2021  ? ?No results found for: PROLACTIN ?Lab Results  ?Component Value Date  ? CHOL 103 05/06/2021  ? TRIG 66 05/06/2021  ? HDL 41 05/06/2021  ? CHOLHDL 2.5 05/06/2021  ? VLDL 13 05/06/2021  ? LDLCALC 49 05/06/2021  ? LDLCALC 40 03/15/2020  ? ? ?Physical Findings: ?AIMS:  , ,  ,  ,    ?CIWA:    ?COWS:    ? ?Musculoskeletal: ?Strength & Muscle Tone: within normal limits ?Gait & Station: normal ?Patient leans: N/A ? ?Psychiatric Specialty Exam: ? ?Presentation  ?General Appearance: Disheveled ? ?Eye Contact:Fair ? ?Speech:Slow ? ?Speech Volume:Decreased ? ?Handedness:Right ? ? ?Mood and Affect  ?Mood:good ?Affect:neutral ? ?Thought Process  ?Thought Processes:organized ?Descriptions of Associations:linear ?Orientation:Partial ? ?Thought Content:Delusions; Illogical; Paranoid Ideation; Perseveration; Scattered ? ?History of Schizophrenia/Schizoaffective disorder:No ? ?Duration of  Psychotic Symptoms:Greater than six months ? ? ?Sensorium  ?Memory:Immediate Poor; Recent Poor; Remote Poor ? ?Judgment:Impaired ? ?Insight:Poor ? ? ?Executive Functions  ?Concentration:Poor ? ?Attention Span:Poor ? ?Recall:Poor ? ?Fund of Knowledge:Poor ? ?Language:Poor ? ? ?Psychomotor Activity  ?Psychomotor Activity:No data recorded ? ?Assets  ?Assets:Resilience ? ? ?Sleep  ?Sleep:No data recorded ? ? ?Review of Systems  ?Constitutional: Negative.   ?HENT: Negative.    ?Eyes: Negative.   ?Respiratory: Negative.    ?Cardiovascular: Negative.   ?  Gastrointestinal: Negative.   ?Genitourinary: Negative.   ?Musculoskeletal: Negative.   ?Skin: Negative.   ?Neurological: Negative.   ?Endo/Heme/Allergies: Negative.   ?Psychiatric/Behavioral: Negative.    ?Blood pressure 134/80, pulse 80, temperature (!) 97.5 ?F (36.4 ?C), temperature source Oral, resp. rate 20, height 5\' 8"  (1.727 m), weight 76.2 kg, SpO2 100 %. Body mass index is 25.54 kg/m?. ? ? ?Treatment Plan Summary: ?Daily contact with patient to assess and evaluate symptoms and progress in treatment, Medication management, and Plan continue current medications.  Assisted living to come out on Monday. ? ?4/22 ?No changes ? ?4/23 ?No changes ? ?Reggie PileAnand Sheylin Scharnhorst, MD ?05/25/2021, 11:31 AM ? ?

## 2021-05-26 DIAGNOSIS — F312 Bipolar disorder, current episode manic severe with psychotic features: Secondary | ICD-10-CM | POA: Diagnosis not present

## 2021-05-26 NOTE — Plan of Care (Signed)
? ?  D: Pt alert and oriented. Pt denies experiencing any pain at this time. Pt denies experiencing any SI/HI, or AVH at this time.  ? ?A: Scheduled medications administered to pt, per MD orders. Support and encouragement provided. Frequent verbal contact made. Routine safety checks conducted q15 minutes.  ? ?R: No adverse drug reactions noted. Pt verbally contracts for safety at this time. Pt compliant with medications. Pt has only been visible in the milieu during meals. Pt mostly self isolates to his room with no initiated interaction.  Pt remains safe at this time. Will continue to monitor.  ? ?Problem: Education: ?Goal: Knowledge of [place] [facility] Education information/materials will improve ?Outcome: Progressing ?  ?Problem: Coping: ?Goal: Level of anxiety will decrease ?Outcome: Progressing ?  ?

## 2021-05-26 NOTE — Progress Notes (Signed)
Recreation Therapy Notes ? ?Date: 05/26/2021 ? ?Time: 1:30 PM   ? ?Location: Craft room  ? ?Behavioral response: Appropriate ? ?Intervention Topic: Social Skills    ? ?Discussion/Intervention:  ?Group content on today was focused on social skills. The group defined social skills and identified ways they use social skills. Patients expressed what obstacles they face when trying to be social. Participants described the importance of social skills. The group listed ways to improve social skills and reasons to improve social skills. Individuals had an opportunity to learn new and improve social skills as well as identify their weaknesses. ?Clinical Observations/Feedback: ?Patient came to group late due to unknown reasons. Individual was social with peers and staff while participating in the intervention.    ?[name] LRT/CTRS  ? ? ? ? ? ? ? ?[name] ?05/26/2021 3:34 PM ?

## 2021-05-26 NOTE — Progress Notes (Signed)
[facility] ? ?05/26/2021 11:20 AM ?Austin Rhodes  ?MRN:  [id number] ?Subjective: Austin Rhodes is seen on rounds today.  He tells me that assisted living came in to see him.  He seems to be in a good mood.  He is taking his medicines as prescribed and denies any side effects.  He is more appropriate with staff and peers.  He has not had any angry outbursts since starting Prolixin.  There is no evidence of EPS or TD.  Overall, he is doing better. ? ?Principal Problem: Bipolar I disorder, current or most recent episode manic, with psychotic features (HCC) ?Diagnosis: Principal Problem: ?  Bipolar I disorder, current or most recent episode manic, with psychotic features (HCC) ?Active Problems: ?  Severe manic bipolar 1 disorder with psychotic behavior (HCC) ?  Bipolar 1 disorder (HCC) ? ?Total Time spent with patient: 15 minutes ? ?Past Psychiatric History:   [employer]  ? ?Past Medical History:  ?Past Medical History:  ?Diagnosis Date  ? Bipolar disorder (HCC)   ? Coronary artery disease   ? GERD (gastroesophageal reflux disease)   ? History of multiple strokes   ? PTSD (post-traumatic stress disorder)   ?  ?Past Surgical History:  ?Procedure Laterality Date  ? CARDIAC CATHETERIZATION    ? ?Family History: History reviewed. No pertinent family history. ? ?Social History:  ?Social History  ? ?Substance and Sexual Activity  ?Alcohol Use No  ?   ?Social History  ? ?Substance and Sexual Activity  ?Drug Use Never  ?  ?Social History  ? ?Socioeconomic History  ? Marital status: Divorced  ?  Spouse name: Not on file  ? Number of children: Not on file  ? Years of education: Not on file  ? Highest education level: Not on file  ?Occupational History  ? Not on file  ?Tobacco Use  ? Smoking status: Never  ? Smokeless tobacco: Never  ?Vaping Use  ? Vaping Use: Never used  ?Substance and Sexual Activity  ? Alcohol use: No  ? Drug use: Never  ? Sexual activity: Not on file  ?Other Topics  Concern  ? Not on file  ?Social History Narrative  ? Not on file  ? ?Social Determinants of Health  ? ?Financial Resource Strain: Not on file  ?Food Insecurity: Not on file  ?Transportation Needs: Not on file  ?Physical Activity: Not on file  ?Stress: Not on file  ?Social Connections: Not on file  ? ?Additional Social History:  ?  ?  ?  ?  ?  ?  ?  ?  ?  ?  ?  ? ?Sleep: Good ? ?Appetite:  Good ? ?Current Medications: ?Current Facility-Administered Medications  ?Medication Dose Route Frequency Provider Last Rate Last Admin  ? acetaminophen (TYLENOL) tablet 650 mg  650 mg Oral Q6H PRN Sarina IllHerrick, Vaunda Gutterman Edward, DO   650 mg at 05/01/21 1515  ? albuterol (PROVENTIL) (2.5 MG/3ML) 0.083% nebulizer solution 3 mL  3 mL Inhalation Q4H PRN Sarina IllHerrick, Journi Moffa Edward, DO      ? alum & mag hydroxide-simeth (MAALOX/MYLANTA) 200-200-20 MG/5ML suspension 30 mL  30 mL Oral Q4H PRN Sarina IllHerrick, Mersades Barbaro Edward, DO      ? chlorproMAZINE (THORAZINE) tablet 50 mg  50 mg Oral Q4H PRN Sarina IllHerrick, Ople Girgis Edward, DO   50 mg at 05/13/21 2129  ? Or  ? chlorproMAZINE (THORAZINE) injection 50 mg  50 mg Intramuscular Q4H PRN Sarina IllHerrick, Finnleigh Marchetti Edward, DO   50 mg at 05/01/21  5397  ? clopidogrel (PLAVIX) tablet 75 mg  75 mg Oral Daily Sarina Ill, DO   75 mg at 05/26/21 6734  ? divalproex (DEPAKOTE ER) 24 hr tablet 1,000 mg  1,000 mg Oral QHS Sarina Ill, DO   1,000 mg at 05/25/21 2110  ? fluPHENAZine (PROLIXIN) tablet 2.5 mg  2.5 mg Oral Q breakfast Clapacs, Jackquline Denmark, MD   2.5 mg at 05/26/21 0905  ? fluPHENAZine (PROLIXIN) tablet 7.5 mg  7.5 mg Oral QHS Sarina Ill, DO   7.5 mg at 05/25/21 2115  ? lamoTRIgine (LAMICTAL) tablet 50 mg  50 mg Oral BID Sarina Ill, DO   50 mg at 05/26/21 1937  ? LORazepam (ATIVAN) tablet 1 mg  1 mg Oral Q4H PRN Sarina Ill, DO   1 mg at 05/05/21 9024  ? losartan (COZAAR) tablet 100 mg  100 mg Oral Daily Sarina Ill, DO   100 mg at 05/26/21 0973  ? magnesium  hydroxide (MILK OF MAGNESIA) suspension 30 mL  30 mL Oral Daily PRN Gabriel Cirri F, NP      ? QUEtiapine (SEROQUEL) tablet 400 mg  400 mg Oral QHS Sarina Ill, DO   400 mg at 05/25/21 2110  ? rosuvastatin (CRESTOR) tablet 10 mg  10 mg Oral Daily Sarina Ill, DO   10 mg at 05/26/21 5329  ? traZODone (DESYREL) tablet 100 mg  100 mg Oral QHS PRN Sarina Ill, DO   100 mg at 05/14/21 2207  ? ? ?Lab Results: No results found for this or any previous visit (from the past 48 hour(s)). ? ?Blood Alcohol level:  ?Lab Results  ?Component Value Date  ? ETH <10 04/28/2021  ? ETH <10 03/13/2020  ? ? ?Metabolic Disorder Labs: ?Lab Results  ?Component Value Date  ? HGBA1C 5.7 (H) 05/06/2021  ? MPG 117 05/06/2021  ? MPG 105.41 04/28/2021  ? ?No results found for: PROLACTIN ?Lab Results  ?Component Value Date  ? CHOL 103 05/06/2021  ? TRIG 66 05/06/2021  ? HDL 41 05/06/2021  ? CHOLHDL 2.5 05/06/2021  ? VLDL 13 05/06/2021  ? LDLCALC 49 05/06/2021  ? LDLCALC 40 03/15/2020  ? ? ?Physical Findings: ?AIMS:  , ,  ,  ,    ?CIWA:    ?COWS:    ? ?Musculoskeletal: ?Strength & Muscle Tone: within normal limits ?Gait & Station: normal ?Patient leans: N/A ? ?Psychiatric Specialty Exam: ? ?Presentation  ?General Appearance: Disheveled ? ?Eye Contact:Fair ? ?Speech:Slow ? ?Speech Volume:Decreased ? ?Handedness:Right ? ? ?Mood and Affect  ?Mood:Angry; Anxious; Irritable ? ?Affect:Restricted ? ? ?Thought Process  ?Thought Processes:Disorganized ? ?Descriptions of Associations:Loose ? ?Orientation:Partial ? ?Thought Content:Delusions; Illogical; Paranoid Ideation; Perseveration; Scattered ? ?History of Schizophrenia/Schizoaffective disorder:No ? ?Duration of Psychotic Symptoms:Greater than six months ? ?Hallucinations:No data recorded ?Ideas of Reference:Paranoia ? ?Suicidal Thoughts:No data recorded ?Homicidal Thoughts:No data recorded ? ?Sensorium  ?Memory:Immediate Poor; Recent Poor; Remote  Poor ? ?Judgment:Impaired ? ?Insight:Poor ? ? ?Executive Functions  ?Concentration:Poor ? ?Attention Span:Poor ? ?Recall:Poor ? ?Fund of Knowledge:Poor ? ?Language:Poor ? ? ?Psychomotor Activity  ?Psychomotor Activity:No data recorded ? ?Assets  ?Assets:Resilience ? ? ?Sleep  ?Sleep:No data recorded ? ? ?Physical Exam: ?Physical Exam ?Vitals and nursing note reviewed.  ?Constitutional:   ?   Appearance: Normal appearance. He is normal weight.  ?Neurological:  ?   General: No focal deficit present.  ?   Mental Status: He is alert and oriented to person, place,  and time.  ?Psychiatric:     ?   Attention and Perception: Attention and perception normal.     ?   Mood and Affect: Mood and affect normal.     ?   Speech: Speech normal.     ?   Behavior: Behavior is uncooperative.     ?   Thought Content: Thought content is delusional.     ?   Cognition and Memory: Cognition and memory normal.     ?   Judgment: Judgment normal.  ? ?Review of Systems  ?Constitutional: Negative.   ?HENT: Negative.    ?Eyes: Negative.   ?Respiratory: Negative.    ?Cardiovascular: Negative.   ?Gastrointestinal: Negative.   ?Genitourinary: Negative.   ?Musculoskeletal: Negative.   ?Skin: Negative.   ?Neurological: Negative.   ?Endo/Heme/Allergies: Negative.   ?Psychiatric/Behavioral: Negative.    ?Blood pressure 113/74, pulse 79, temperature 97.9 ?F (36.6 ?C), resp. rate 20, height 5\' 8"  (1.727 m), weight 76.2 kg, SpO2 97 %. Body mass index is 25.54 kg/m?. ? ? ?Treatment Plan Summary: ?Daily contact with patient to assess and evaluate symptoms and progress in treatment, Medication management, and Plan continue current medications. ? ? , DO ?05/26/2021, 11:20 AM ? ?

## 2021-05-26 NOTE — [facility] Group Notes (Signed)
[facility] Group Notes:  (Nursing/MHT/Case Management/Adjunct) ? ?Date:  05/26/2021  ?Time: 10:00 AM ? ?Type of Therapy:  Psychoeducational Skills ? ?Participation Level:  Did Not Attend ? ?Participation Quality:   N/A ? ?Affect:   N/A ? ?Cognitive:   N/A ? ?Insight:  None ? ?Engagement in Group:   N/A ? ?Modes of Intervention:   N/A ? ?Summary of Progress/Problems: ?The pt did not attend group. The pt was asleep. ?Barbaraann Rondo ?05/26/2021, 11:22 AM ?

## 2021-05-26 NOTE — [facility] Counselor (Signed)
CSW met with Kennyth Lose from [employer],  [phone], who assessed pt for admission purposes. She stated that pt was ambulatory and after completed paperwork pt would be admitted to their facility sometime this week.  ? ?CSW send updated FL2, TB Test results and Covid test to [facility] and coordinate with Tyler Aas regarding transportation when date of discharge is determined.  ? ?Kennyth Lose stated that pt's prescriptions can be sent to [facility] in [place].  ? ?No other requests were made. Conversation ended without incident.  ? ?Ernesto Zukowski [place], MSW, LCSW-A ?4/24/202311:15 AM  ?

## 2021-05-27 DIAGNOSIS — F312 Bipolar disorder, current episode manic severe with psychotic features: Secondary | ICD-10-CM | POA: Diagnosis not present

## 2021-05-27 NOTE — Plan of Care (Signed)
?  Problem: Education: ?Goal: Knowledge of [place] [facility] Education information/materials will improve ?Outcome: Progressing ?Goal: Mental status will improve ?Outcome: Progressing ?  ?Problem: Health Behavior/Discharge Planning: ?Goal: Identification of resources available to assist in meeting health care needs will improve ?Outcome: Progressing ?Goal: Compliance with treatment plan for underlying cause of condition will improve ?Outcome: Progressing ?  ?Problem: Safety: ?Goal: Periods of time without injury will increase ?Outcome: Progressing ?  ?Problem: Education: ?Goal: Knowledge of General Education information will improve ?Description: Including pain rating scale, medication(s)/side effects and non-pharmacologic comfort measures ?Outcome: Progressing ?  ?Problem: Health Behavior/Discharge Planning: ?Goal: Ability to manage health-related needs will improve ?Outcome: Progressing ?  ?Problem: Clinical Measurements: ?Goal: Ability to maintain clinical measurements within normal limits will improve ?Outcome: Progressing ?Goal: Will remain free from infection ?Outcome: Progressing ?Goal: Diagnostic test results will improve ?Outcome: Progressing ?Goal: Respiratory complications will improve ?Outcome: Progressing ?Goal: Cardiovascular complication will be avoided ?Outcome: Progressing ?  ?Problem: Activity: ?Goal: Risk for activity intolerance will decrease ?Outcome: Progressing ?  ?Problem: Nutrition: ?Goal: Adequate nutrition will be maintained ?Outcome: Progressing ?  ?Problem: Coping: ?Goal: Level of anxiety will decrease ?Outcome: Progressing ?  ?Problem: Elimination: ?Goal: Will not experience complications related to bowel motility ?Outcome: Progressing ?Goal: Will not experience complications related to urinary retention ?Outcome: Progressing ?  ?Problem: Pain Managment: ?Goal: General experience of comfort will improve ?Outcome: Progressing ?  ?Problem: Safety: ?Goal: Ability to remain free from  injury will improve ?Outcome: Progressing ?  ?Problem: Skin Integrity: ?Goal: Risk for impaired skin integrity will decrease ?Outcome: Progressing ?  ?

## 2021-05-27 NOTE — Progress Notes (Signed)
[facility] MD Progress Note ? ?05/27/2021 12:13 PM ?Austin Rhodes  ?MRN:  [id number] ?Subjective: Austin Rhodes has been pleasant and cooperative but delusional and irritable at times.  He is sleeping well and his appetite is good.  He is definitely closer to baseline.  He denies any side effects from his medication.  Awaiting to hear from social work on what day he is leaving. ? ?Principal Problem: Bipolar I disorder, current or most recent episode manic, with psychotic features (HCC) ?Diagnosis: Principal Problem: ?  Bipolar I disorder, current or most recent episode manic, with psychotic features (HCC) ?Active Problems: ?  Severe manic bipolar 1 disorder with psychotic behavior (HCC) ?  Bipolar 1 disorder (HCC) ? ?Total Time spent with patient: 15 minutes ? ?Past Psychiatric History: Yes ? ?Past Medical History:  ?Past Medical History:  ?Diagnosis Date  ? Bipolar disorder (HCC)   ? Coronary artery disease   ? GERD (gastroesophageal reflux disease)   ? History of multiple strokes   ? PTSD (post-traumatic stress disorder)   ?  ?Past Surgical History:  ?Procedure Laterality Date  ? CARDIAC CATHETERIZATION    ? ?Family History: History reviewed. No pertinent family history. ? ?Social History:  ?Social History  ? ?Substance and Sexual Activity  ?Alcohol Use No  ?   ?Social History  ? ?Substance and Sexual Activity  ?Drug Use Never  ?  ?Social History  ? ?Socioeconomic History  ? Marital status: Divorced  ?  Spouse name: Not on file  ? Number of children: Not on file  ? Years of education: Not on file  ? Highest education level: Not on file  ?Occupational History  ? Not on file  ?Tobacco Use  ? Smoking status: Never  ? Smokeless tobacco: Never  ?Vaping Use  ? Vaping Use: Never used  ?Substance and Sexual Activity  ? Alcohol use: No  ? Drug use: Never  ? Sexual activity: Not on file  ?Other Topics Concern  ? Not on file  ?Social History Narrative  ? Not on file  ? ?Social Determinants of Health  ? ?Financial Resource Strain:  Not on file  ?Food Insecurity: Not on file  ?Transportation Needs: Not on file  ?Physical Activity: Not on file  ?Stress: Not on file  ?Social Connections: Not on file  ? ?Additional Social History:  ?  ?  ?  ?  ?  ?  ?  ?  ?  ?  ?  ? ?Sleep: Good ? ?Appetite:  Good ? ?Current Medications: ?Current Facility-Administered Medications  ?Medication Dose Route Frequency Provider Last Rate Last Admin  ? acetaminophen (TYLENOL) tablet 650 mg  650 mg Oral Q6H PRN Sarina Ill, DO   650 mg at 05/01/21 1515  ? albuterol (PROVENTIL) (2.5 MG/3ML) 0.083% nebulizer solution 3 mL  3 mL Inhalation Q4H PRN Sarina Ill, DO      ? alum & mag hydroxide-simeth (MAALOX/MYLANTA) 200-200-20 MG/5ML suspension 30 mL  30 mL Oral Q4H PRN Sarina Ill, DO      ? chlorproMAZINE (THORAZINE) tablet 50 mg  50 mg Oral Q4H PRN Sarina Ill, DO   50 mg at 05/13/21 2129  ? Or  ? chlorproMAZINE (THORAZINE) injection 50 mg  50 mg Intramuscular Q4H PRN Sarina Ill, DO   50 mg at 05/01/21 9983  ? clopidogrel (PLAVIX) tablet 75 mg  75 mg Oral Daily Sarina Ill, DO   75 mg at 05/27/21 3825  ? divalproex (DEPAKOTE ER)  24 hr tablet 1,000 mg  1,000 mg Oral QHS Sarina Ill, DO   1,000 mg at 05/26/21 2121  ? fluPHENAZine (PROLIXIN) tablet 2.5 mg  2.5 mg Oral Q breakfast Clapacs, Jackquline Denmark, MD   2.5 mg at 05/27/21 6301  ? fluPHENAZine (PROLIXIN) tablet 7.5 mg  7.5 mg Oral QHS Sarina Ill, DO   7.5 mg at 05/26/21 2122  ? lamoTRIgine (LAMICTAL) tablet 50 mg  50 mg Oral BID Sarina Ill, DO   50 mg at 05/27/21 6010  ? LORazepam (ATIVAN) tablet 1 mg  1 mg Oral Q4H PRN Sarina Ill, DO   1 mg at 05/05/21 9323  ? losartan (COZAAR) tablet 100 mg  100 mg Oral Daily Sarina Ill, DO   100 mg at 05/27/21 5573  ? magnesium hydroxide (MILK OF MAGNESIA) suspension 30 mL  30 mL Oral Daily PRN Vanetta Mulders, NP      ? QUEtiapine (SEROQUEL) tablet 400 mg   400 mg Oral QHS Sarina Ill, DO   400 mg at 05/26/21 2121  ? rosuvastatin (CRESTOR) tablet 10 mg  10 mg Oral Daily Sarina Ill, DO   10 mg at 05/27/21 2202  ? traZODone (DESYREL) tablet 100 mg  100 mg Oral QHS PRN Sarina Ill, DO   100 mg at 05/14/21 2207  ? ? ?Lab Results: No results found for this or any previous visit (from the past 48 hour(s)). ? ?Blood Alcohol level:  ?Lab Results  ?Component Value Date  ? ETH <10 04/28/2021  ? ETH <10 03/13/2020  ? ? ?Metabolic Disorder Labs: ?Lab Results  ?Component Value Date  ? HGBA1C 5.7 (H) 05/06/2021  ? MPG 117 05/06/2021  ? MPG 105.41 04/28/2021  ? ?No results found for: PROLACTIN ?Lab Results  ?Component Value Date  ? CHOL 103 05/06/2021  ? TRIG 66 05/06/2021  ? HDL 41 05/06/2021  ? CHOLHDL 2.5 05/06/2021  ? VLDL 13 05/06/2021  ? LDLCALC 49 05/06/2021  ? LDLCALC 40 03/15/2020  ? ? ?Physical Findings: ?AIMS:  , ,  ,  ,    ?CIWA:    ?COWS:    ? ?Musculoskeletal: ?Strength & Muscle Tone: within normal limits ?Gait & Station: unsteady ?Patient leans: N/A ? ?Psychiatric Specialty Exam: ? ?Presentation  ?General Appearance: Disheveled ? ?Eye Contact:Fair ? ?Speech:Slow ? ?Speech Volume:Decreased ? ?Handedness:Right ? ? ?Mood and Affect  ?Mood:Angry; Anxious; Irritable ? ?Affect:Restricted ? ? ?Thought Process  ?Thought Processes:Disorganized ? ?Descriptions of Associations:Loose ? ?Orientation:Partial ? ?Thought Content:Delusions; Illogical; Paranoid Ideation; Perseveration; Scattered ? ?History of Schizophrenia/Schizoaffective disorder:No ? ?Duration of Psychotic Symptoms:Greater than six months ? ?Hallucinations:No data recorded ?Ideas of Reference:Paranoia ? ?Suicidal Thoughts:No data recorded ?Homicidal Thoughts:No data recorded ? ?Sensorium  ?Memory:Immediate Poor; Recent Poor; Remote Poor ? ?Judgment:Impaired ? ?Insight:Poor ? ? ?Executive Functions  ?Concentration:Poor ? ?Attention Span:Poor ? ?Recall:Poor ? ?Fund of  Knowledge:Poor ? ?Language:Poor ? ? ?Psychomotor Activity  ?Psychomotor Activity:No data recorded ? ?Assets  ?Assets:Resilience ? ? ?Sleep  ?Sleep:No data recorded ? ? ?Physical Exam: ?Physical Exam ?ROS ?Blood pressure 113/74, pulse 79, temperature 97.9 ?F (36.6 ?C), resp. rate 20, height 5\' 8"  (1.727 m), weight 76.2 kg, SpO2 97 %. Body mass index is 25.54 kg/m?. ? ? ?Treatment Plan Summary: ?Daily contact with patient to assess and evaluate symptoms and progress in treatment, Medication management, and Plan continue current medications. ? ? , DO ?05/27/2021, 12:13 PM ? ?

## 2021-05-27 NOTE — Progress Notes (Signed)
Pt denies SI/HI and AVH. He isolated to room most of time and in bed. He had a visit from his daughter and the visit well. Pt was laughing and smile when she was here.  No complaints of anxiety or depression. Pt still delusional and paranoid,  he came to the nursing station and asked if he had any charges against him and then went back to bed. He might be discharged  tomorrow. Minimal interaction with peers and staff. ?

## 2021-05-27 NOTE — [place] FL2 (Signed)
?[place] MEDICAID FL2 LEVEL OF CARE SCREENING TOOL  ?  ? ?IDENTIFICATION  ?Patient Name: ?Austin Rhodes Birthdate: 02-09-1950 Sex: male Admission Date (Current Location): ?04/29/2021  ?[place] Number: ?  ?  Facility and Address:  ?[facility], [address], [place], [place] [address] ?     Provider Number: ?[id number]  ?Attending Physician Name and Address:  ?Sarina Ill,* ? Relative Name and Phone Number:  ?Randolf Sansoucie, son, [phone] ?   ?Current Level of Care: ?Hospital Recommended Level of Care: ?Family Care Home, Nursing Facility, Assisted Living Facility Prior Approval Number: ?  ? ?Date Approved/Denied: ?  PASRR Number: ?  ? ?Discharge Plan: ?Other (Comment) (Nursing facility, family care home, assisted living facility) ?  ? ?Current Diagnoses: ?Patient Active Problem List  ? Diagnosis Date Noted  ? Severe manic bipolar 1 disorder with psychotic behavior (HCC) 04/29/2021  ? Bipolar 1 disorder (HCC) 04/29/2021  ? Bipolar affective disorder, current episode manic with psychotic symptoms (HCC) 03/15/2020  ? HLD (hyperlipidemia) 03/28/2019  ? SVT (supraventricular tachycardia) (HCC) 03/28/2019  ? Bipolar disorder (HCC) 12/26/2018  ? Head injury 12/25/2018  ? Paranoid schizophrenia (HCC) 12/25/2018  ? Schizophrenia, paranoid (HCC)   ? Acute encephalopathy   ? Affective psychosis, bipolar (HCC) 12/18/2018  ? HTN (hypertension) 11/01/2017  ? Bipolar I disorder, current or most recent episode manic, with psychotic features (HCC) 10/31/2017  ? CAD (coronary artery disease) 08/31/2016  ? GERD (gastroesophageal reflux disease) 08/31/2016  ? History of CVA (cerebrovascular accident) 08/31/2016  ? History of syncope 08/31/2016  ? History of thrombocytopenia 08/31/2016  ? Prediabetes 08/31/2016  ? BPH (benign prostatic hyperplasia) 08/31/2016  ? Chest pain 05/09/2015  ? ? ?Orientation RESPIRATION BLADDER Height & Weight   ?  ?Self ? Normal Continent  (Periods of incontinence with urine) Weight: 168 lb (76.2 kg) ?Height:  5\' 8"  (172.7 cm)  ?BEHAVIORAL SYMPTOMS/MOOD NEUROLOGICAL BOWEL NUTRITION STATUS  ?  (N/A) Continent  (N/A)  ?AMBULATORY STATUS COMMUNICATION OF NEEDS Skin   ?Independent Verbally Normal ?  ?  ?  ?    ?Pt occasionally used rolling walker with 2 wheels on the unit     ?     ? ? ?Personal Care Assistance Level of Assistance  ?Bathing Bathing Assistance: Limited assistance ? Patient can bathe and clothe himself, just needs bathing items like toiletries and bath cloth, towel set out and prepared ?  ?   ? ?Functional Limitations Info  ?Sight Sight Info: Impaired (wears glasses) ?  ?   ? ? ?SPECIAL CARE FACTORS FREQUENCY  ? (N/A)   ?  ?  ?  ?  ?  ?  ?   ? ? ?Contractures Contractures Info: Not present  ? ? ?Additional Factors Info  ?Code Status Code Status Info: FULL ?  ?  ?  ?  ?   ? ?Current Medications (05/27/2021):  This is the current hospital active medication list ?Current Facility-Administered Medications  ?Medication Dose Route Frequency Provider Last Rate Last Admin  ? acetaminophen (TYLENOL) tablet 650 mg  650 mg Oral Q6H PRN 05/29/2021, DO   650 mg at 05/01/21 1515  ? albuterol (PROVENTIL) (2.5 MG/3ML) 0.083% nebulizer solution 3 mL  3 mL Inhalation Q4H PRN 05/03/21, DO      ? alum & mag hydroxide-simeth (MAALOX/MYLANTA) 200-200-20 MG/5ML suspension 30 mL  30 mL Oral Q4H PRN 08-11-2000, DO      ? chlorproMAZINE (  THORAZINE) tablet 50 mg  50 mg Oral Q4H PRN Sarina Ill, DO   50 mg at 05/13/21 2129  ? Or  ? chlorproMAZINE (THORAZINE) injection 50 mg  50 mg Intramuscular Q4H PRN Sarina Ill, DO   50 mg at 05/01/21 5035  ? clopidogrel (PLAVIX) tablet 75 mg  75 mg Oral Daily Sarina Ill, DO   75 mg at 05/27/21 4656  ? divalproex (DEPAKOTE ER) 24 hr tablet 1,000 mg  1,000 mg Oral QHS Sarina Ill, DO   1,000 mg at 05/26/21 2121  ? fluPHENAZine (PROLIXIN) tablet  2.5 mg  2.5 mg Oral Q breakfast Clapacs, Jackquline [place], MD   2.5 mg at 05/27/21 8127  ? fluPHENAZine (PROLIXIN) tablet 7.5 mg  7.5 mg Oral QHS Sarina Ill, DO   7.5 mg at 05/26/21 2122  ? lamoTRIgine (LAMICTAL) tablet 50 mg  50 mg Oral BID Sarina Ill, DO   50 mg at 05/27/21 5170  ? LORazepam (ATIVAN) tablet 1 mg  1 mg Oral Q4H PRN Sarina Ill, DO   1 mg at 05/05/21 0174  ? losartan (COZAAR) tablet 100 mg  100 mg Oral Daily Sarina Ill, DO   100 mg at 05/27/21 9449  ? magnesium hydroxide (MILK OF MAGNESIA) suspension 30 mL  30 mL Oral Daily PRN Vanetta Mulders, NP      ? QUEtiapine (SEROQUEL) tablet 400 mg  400 mg Oral QHS Sarina Ill, DO   400 mg at 05/26/21 2121  ? rosuvastatin (CRESTOR) tablet 10 mg  10 mg Oral Daily Sarina Ill, DO   10 mg at 05/27/21 6759  ? traZODone (DESYREL) tablet 100 mg  100 mg Oral QHS PRN Sarina Ill, DO   100 mg at 05/14/21 2207  ? ? ? ?Discharge Medications: ?Please see discharge summary for a list of discharge medications. ? ?Relevant Imaging Results: ? ?Relevant Lab Results: ? ? ?Additional Information ?  ? ?Cigi Bega A [place], LCSWA ? ? ? ? ?

## 2021-05-27 NOTE — [facility] Group Notes (Signed)
[facility] Group Notes:  (Nursing/MHT/Case Management/Adjunct) ? ?Date:  05/27/2021  ?Time:  10:00 AM ? ?Type of Therapy:  Psychoeducational Skills ? ?Participation Level:  Did Not Attend ? ?Participation Quality:   N/A\ ? ?Affect:   N/A ? ?Cognitive:   N/A ? ?Insight:  None ? ?Engagement in Group:   N/A ? ?Modes of Intervention:   N/A ? ?Summary of Progress/Problems: ?The pt did not attend group. The pt was in the room asleep. ?Barbaraann Rondo ?05/27/2021, 12:36 PM ?

## 2021-05-27 NOTE — Plan of Care (Signed)
Patient remain alert and oriented calm and cooperative during assessment. Denies pain or discomfort at this time. Denies anxiety and depression. Denies SI, HI, and AVH.  Ate meals in the day room among peers and tolerated well. Compliant with all due medications. Remain safe on the unit with Q 15 minutes safety checks. ? ?Problem: Education: ?Goal: Knowledge of [facility] Education information/materials will improve ?Outcome: Progressing ?Goal: Mental status will improve ?Outcome: Progressing ?  ?Problem: Health Behavior/Discharge Planning: ?Goal: Identification of resources available to assist in meeting health care needs will improve ?Outcome: Progressing ?Goal: Compliance with treatment plan for underlying cause of condition will improve ?Outcome: Progressing ?  ?Problem: Safety: ?Goal: Periods of time without injury will increase ?Outcome: Progressing ?  ?Problem: Education: ?Goal: Knowledge of General Education information will improve ?Description: Including pain rating scale, medication(s)/side effects and non-pharmacologic comfort measures ?Outcome: Progressing ?  ?Problem: Health Behavior/Discharge Planning: ?Goal: Ability to manage health-related needs will improve ?Outcome: Progressing ?  ?Problem: Clinical Measurements: ?Goal: Ability to maintain clinical measurements within normal limits will improve ?Outcome: Progressing ?Goal: Will remain free from infection ?Outcome: Progressing ?Goal: Diagnostic test results will improve ?Outcome: Progressing ?Goal: Respiratory complications will improve ?Outcome: Progressing ?Goal: Cardiovascular complication will be avoided ?Outcome: Progressing ?  ?Problem: Activity: ?Goal: Risk for activity intolerance will decrease ?Outcome: Progressing ?  ?Problem: Nutrition: ?Goal: Adequate nutrition will be maintained ?Outcome: Progressing ?  ?Problem: Coping: ?Goal: Level of anxiety will decrease ?Outcome: Progressing ?  ?Problem: Elimination: ?Goal: Will not  experience complications related to bowel motility ?Outcome: Progressing ?Goal: Will not experience complications related to urinary retention ?Outcome: Progressing ?  ?Problem: Pain Managment: ?Goal: General experience of comfort will improve ?Outcome: Progressing ?  ?Problem: Safety: ?Goal: Ability to remain free from injury will improve ?Outcome: Progressing ?  ?Problem: Skin Integrity: ?Goal: Risk for impaired skin integrity will decrease ?Outcome: Progressing ?  ?

## 2021-05-28 DIAGNOSIS — F312 Bipolar disorder, current episode manic severe with psychotic features: Secondary | ICD-10-CM | POA: Diagnosis not present

## 2021-05-28 MED ORDER — TUBERCULIN PPD 5 UNIT/0.1ML ID SOLN
5.0000 [IU] | Freq: Once | INTRADERMAL | Status: DC
  Filled 2021-05-28: qty 0.1

## 2021-05-28 NOTE — Progress Notes (Signed)
Pt lying in bed with eyes open; calm, cooperative. Pt states "I'm doing pretty good." He currently denies pain, SI/HI/AVH, anxiety and depression. He reports "I been sleeping well" and says "I might have too much appetite." Pt states "I'm a little disappointed a how long it's taking for me to get out of here." No acute distress noted. ?

## 2021-05-28 NOTE — Group Note (Signed)
LCSW Group Therapy Note ? ?Group Date: 05/28/2021 ?Start Time: 1315 ?End Time: 1400 ? ? ?Type of Therapy and Topic:  Group Therapy - Healthy vs Unhealthy Coping Skills ? ?Participation Level:  Minimal  ? ?Description of Group ?The focus of this group was to determine what unhealthy coping techniques typically are used by group members and what healthy coping techniques would be helpful in coping with various problems. Patients were guided in becoming aware of the differences between healthy and unhealthy coping techniques. Patients were asked to identify 2-3 healthy coping skills they would like to learn to use more effectively. ? ?Therapeutic Goals ?Patients learned that coping is what human beings do all day long to deal with various situations in their lives ?Patients defined and discussed healthy vs unhealthy coping techniques ?Patients identified their preferred coping techniques and identified whether these were healthy or unhealthy ?Patients determined 2-3 healthy coping skills they would like to become more familiar with and use more often. ?Patients provided support and ideas to each other ? ? ?Summary of Patient Progress: Patient was present for the entirety of the group session. Patient was an active listener and added nuance to topic of conversation. He stated he was interested in doing whatever the doctor and his daughter asked and was looking forward to going to his new facility.  ? ? ? ?Therapeutic Modalities ?Cognitive Behavioral Therapy ?Motivational Interviewing ? ?Alohilani Levenhagen A [place], LCSWA ?05/28/2021  3:21 PM   ?

## 2021-05-28 NOTE — Plan of Care (Signed)
Patient remain alert and oriented, calm, cooperative and pleasant. Denies pain or discomfort. Denies anxiety, depression, SI, HI and AVH. Reports sleeping well. Appetite good at meals in the day room among peers and tolerated well. Compliant with due medications. Remain safe on the unit. ?Problem: Education: ?Goal: Knowledge of [facility] Education information/materials will improve ?Outcome: Progressing ?Goal: Mental status will improve ?Outcome: Progressing ?  ?Problem: Health Behavior/Discharge Planning: ?Goal: Identification of resources available to assist in meeting health care needs will improve ?Outcome: Progressing ?Goal: Compliance with treatment plan for underlying cause of condition will improve ?Outcome: Progressing ?  ?Problem: Safety: ?Goal: Periods of time without injury will increase ?Outcome: Progressing ?  ?Problem: Education: ?Goal: Knowledge of General Education information will improve ?Description: Including pain rating scale, medication(s)/side effects and non-pharmacologic comfort measures ?Outcome: Progressing ?  ?Problem: Health Behavior/Discharge Planning: ?Goal: Ability to manage health-related needs will improve ?Outcome: Progressing ?  ?Problem: Clinical Measurements: ?Goal: Ability to maintain clinical measurements within normal limits will improve ?Outcome: Progressing ?Goal: Will remain free from infection ?Outcome: Progressing ?Goal: Diagnostic test results will improve ?Outcome: Progressing ?Goal: Respiratory complications will improve ?Outcome: Progressing ?Goal: Cardiovascular complication will be avoided ?Outcome: Progressing ?  ?Problem: Activity: ?Goal: Risk for activity intolerance will decrease ?Outcome: Progressing ?  ?Problem: Nutrition: ?Goal: Adequate nutrition will be maintained ?Outcome: Progressing ?  ?Problem: Coping: ?Goal: Level of anxiety will decrease ?Outcome: Progressing ?  ?Problem: Elimination: ?Goal: Will not experience complications related to  bowel motility ?Outcome: Progressing ?Goal: Will not experience complications related to urinary retention ?Outcome: Progressing ?  ?Problem: Pain Managment: ?Goal: General experience of comfort will improve ?Outcome: Progressing ?  ?Problem: Safety: ?Goal: Ability to remain free from injury will improve ?Outcome: Progressing ?  ?Problem: Skin Integrity: ?Goal: Risk for impaired skin integrity will decrease ?Outcome: Progressing ?  ?
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
## 2021-05-28 NOTE — Progress Notes (Signed)
[facility] MD Progress Note ? ?05/28/2021 12:00 PM ?Austin Rhodes  ?MRN:  [id number] ?Subjective: Austin Rhodes continues to do well.  The nurses can find his PPD results I will reorder it.  He is taking his medications as prescribed and denies any side effects.  He has been pleasant and cooperative but still delusional. ? ?Principal Problem: Bipolar I disorder, current or most recent episode manic, with psychotic features (HCC) ?Diagnosis: Principal Problem: ?  Bipolar I disorder, current or most recent episode manic, with psychotic features (HCC) ?Active Problems: ?  Severe manic bipolar 1 disorder with psychotic behavior (HCC) ?  Bipolar 1 disorder (HCC) ? ?Total Time spent with patient: 15 minutes ? ?Past Psychiatric History:   [employer]  ? ?Past Medical History:  ?Past Medical History:  ?Diagnosis Date  ? Bipolar disorder (HCC)   ? Coronary artery disease   ? GERD (gastroesophageal reflux disease)   ? History of multiple strokes   ? PTSD (post-traumatic stress disorder)   ?  ?Past Surgical History:  ?Procedure Laterality Date  ? CARDIAC CATHETERIZATION    ? ?Family History: History reviewed. No pertinent family history. ? ?Social History:  ?Social History  ? ?Substance and Sexual Activity  ?Alcohol Use No  ?   ?Social History  ? ?Substance and Sexual Activity  ?Drug Use Never  ?  ?Social History  ? ?Socioeconomic History  ? Marital status: Divorced  ?  Spouse name: Not on file  ? Number of children: Not on file  ? Years of education: Not on file  ? Highest education level: Not on file  ?Occupational History  ? Not on file  ?Tobacco Use  ? Smoking status: Never  ? Smokeless tobacco: Never  ?Vaping Use  ? Vaping Use: Never used  ?Substance and Sexual Activity  ? Alcohol use: No  ? Drug use: Never  ? Sexual activity: Not on file  ?Other Topics Concern  ? Not on file  ?Social History Narrative  ? Not on file  ? ?Social Determinants of Health  ? ?Financial Resource Strain: Not on file  ?Food  Insecurity: Not on file  ?Transportation Needs: Not on file  ?Physical Activity: Not on file  ?Stress: Not on file  ?Social Connections: Not on file  ? ?Additional Social History:  ?  ?  ?  ?  ?  ?  ?  ?  ?  ?  ?  ? ?Sleep: Good ? ?Appetite:  Good ? ?Current Medications: ?Current Facility-Administered Medications  ?Medication Dose Route Frequency Provider Last Rate Last Admin  ? acetaminophen (TYLENOL) tablet 650 mg  650 mg Oral Q6H PRN Sarina Ill, DO   650 mg at 05/01/21 1515  ? albuterol (PROVENTIL) (2.5 MG/3ML) 0.083% nebulizer solution 3 mL  3 mL Inhalation Q4H PRN Sarina Ill, DO      ? alum & mag hydroxide-simeth (MAALOX/MYLANTA) 200-200-20 MG/5ML suspension 30 mL  30 mL Oral Q4H PRN Sarina Ill, DO      ? chlorproMAZINE (THORAZINE) tablet 50 mg  50 mg Oral Q4H PRN Sarina Ill, DO   50 mg at 05/13/21 2129  ? Or  ? chlorproMAZINE (THORAZINE) injection 50 mg  50 mg Intramuscular Q4H PRN Sarina Ill, DO   50 mg at 05/01/21 8937  ? clopidogrel (PLAVIX) tablet 75 mg  75 mg Oral Daily Sarina Ill, DO   75 mg at 05/28/21 3428  ? divalproex (DEPAKOTE ER) 24 hr tablet 1,000 mg  1,000 mg Oral QHS Sarina IllHerrick, Harneet Noblett Edward, DO   1,000 mg at 05/27/21 2215  ? fluPHENAZine (PROLIXIN) tablet 2.5 mg  2.5 mg Oral Q breakfast Clapacs, Jackquline DenmarkJohn T, MD   2.5 mg at 05/28/21 [id number]  ? fluPHENAZine (PROLIXIN) tablet 7.5 mg  7.5 mg Oral QHS Sarina IllHerrick, Janeah Kovacich Edward, DO   7.5 mg at 05/27/21 2216  ? lamoTRIgine (LAMICTAL) tablet 50 mg  50 mg Oral BID Sarina IllHerrick, Mehak Roskelley Edward, DO   50 mg at 05/28/21 [id number]  ? LORazepam (ATIVAN) tablet 1 mg  1 mg Oral Q4H PRN Sarina IllHerrick, Adith Tejada Edward, DO   1 mg at 05/05/21 [id number]  ? losartan (COZAAR) tablet 100 mg  100 mg Oral Daily Sarina IllHerrick, Shyla Gayheart Edward, DO   100 mg at 05/28/21 [id number]  ? magnesium hydroxide (MILK OF MAGNESIA) suspension 30 mL  30 mL Oral Daily PRN Vanetta MuldersBarthold, Louise F, NP      ? QUEtiapine (SEROQUEL) tablet 400 mg  400 mg Oral QHS  Sarina IllHerrick, Hillari Zumwalt Edward, DO   400 mg at 05/27/21 2215  ? rosuvastatin (CRESTOR) tablet 10 mg  10 mg Oral Daily Sarina IllHerrick, Symphonie Schneiderman Edward, DO   10 mg at 05/28/21 [id number]  ? traZODone (DESYREL) tablet 100 mg  100 mg Oral QHS PRN Sarina IllHerrick, Shermaine Rivet Edward, DO   100 mg at 05/14/21 2207  ? tuberculin injection 5 Units  5 Units Intradermal Once Sarina IllHerrick, Cammy Sanjurjo Edward, DO      ? ? ?Lab Results: No results found for this or any previous visit (from the past 48 hour(s)). ? ?Blood Alcohol level:  ?Lab Results  ?Component Value Date  ? ETH <10 04/28/2021  ? ETH <10 03/13/2020  ? ? ?Metabolic Disorder Labs: ?Lab Results  ?Component Value Date  ? HGBA1C 5.7 (H) 05/06/2021  ? MPG 117 05/06/2021  ? MPG 105.41 04/28/2021  ? ?No results found for: PROLACTIN ?Lab Results  ?Component Value Date  ? CHOL 103 05/06/2021  ? TRIG 66 05/06/2021  ? HDL 41 05/06/2021  ? CHOLHDL 2.5 05/06/2021  ? VLDL 13 05/06/2021  ? LDLCALC 49 05/06/2021  ? LDLCALC 40 03/15/2020  ? ? ?Physical Findings: ?AIMS:  , ,  ,  ,    ?CIWA:    ?COWS:    ? ?Musculoskeletal: ?Strength & Muscle Tone: within normal limits ?Gait & Station: normal ?Patient leans: N/A ? ?Psychiatric Specialty Exam: ? ?Presentation  ?General Appearance: Disheveled ? ?Eye Contact:Fair ? ?Speech:Slow ? ?Speech Volume:Decreased ? ?Handedness:Right ? ? ?Mood and Affect  ?Mood:Angry; Anxious; Irritable ? ?Affect:Restricted ? ? ?Thought Process  ?Thought Processes:Disorganized ? ?Descriptions of Associations:Loose ? ?Orientation:Partial ? ?Thought Content:Delusions; Illogical; Paranoid Ideation; Perseveration; Scattered ? ?History of Schizophrenia/Schizoaffective disorder:No ? ?Duration of Psychotic Symptoms:Greater than six months ? ?Hallucinations:No data recorded ?Ideas of Reference:Paranoia ? ?Suicidal Thoughts:No data recorded ?Homicidal Thoughts:No data recorded ? ?Sensorium  ?Memory:Immediate Poor; Recent Poor; Remote Poor ? ?Judgment:Impaired ? ?Insight:Poor ? ? ?Executive Functions   ?Concentration:Poor ? ?Attention Span:Poor ? ?Recall:Poor ? ?Fund of Knowledge:Poor ? ?Language:Poor ? ? ?Psychomotor Activity  ?Psychomotor Activity:No data recorded ? ?Assets  ?Assets:Resilience ? ? ?Sleep  ?Sleep:No data recorded ? ? ?Physical Exam: ?Physical Exam ?Vitals and nursing note reviewed.  ?Constitutional:   ?   Appearance: Normal appearance. He is normal weight.  ?Neurological:  ?   General: No focal deficit present.  ?   Mental Status: He is alert and oriented to person, place, and time.  ?Psychiatric:     ?   Attention and Perception: Attention normal.     ?  Mood and Affect: Mood and affect normal.     ?   Speech: Speech normal.     ?   Behavior: Behavior normal. Behavior is cooperative.     ?   Thought Content: Thought content is paranoid and delusional.     ?   Cognition and Memory: Cognition is impaired. Memory is impaired.     ?   Judgment: Judgment is impulsive and inappropriate.  ? ?Review of Systems  ?Constitutional: Negative.   ?HENT: Negative.    ?Eyes: Negative.   ?Respiratory: Negative.    ?Cardiovascular: Negative.   ?Gastrointestinal: Negative.   ?Genitourinary: Negative.   ?Musculoskeletal: Negative.   ?Skin: Negative.   ?Neurological: Negative.   ?Endo/Heme/Allergies: Negative.   ?Psychiatric/Behavioral: Negative.    ?Blood pressure 116/88, pulse 78, temperature 97.6 ?F (36.4 ?C), temperature source Oral, resp. rate 18, height 5\' 8"  (1.727 m), weight 76.2 kg, SpO2 100 %. Body mass index is 25.54 kg/m?. ? ? ?Treatment Plan Summary: ?Daily contact with patient to assess and evaluate symptoms and progress in treatment, Medication management, and Plan reorder PPD. ? ? , DO ?05/28/2021, 12:00 PM ? ?

## 2021-05-28 NOTE — BHH Counselor (Signed)
CSW spoke with pt's daughter regarding discharge. She stated that she would be able to pick up pt Friday for discharge. She also requested that provider put in an order for a walker if possible. CSW will follow up provider regarding order request.  ?CSW will send documents to [facility] once PPD documentation is complete and set discharge date for Friday if possible.  ? ?No other requests were made. Conversation ended without incident.  ? ?Austin Rhodes, MSW, LCSW-A ?4/26/202311:46 AM  ?

## 2021-05-28 NOTE — Progress Notes (Signed)
Recreation Therapy Notes ? ?Date: 05/28/2021  ?  ?Time: 1:25 PM   ?  ?Location: Courtyard  ?  ?Behavioral response: Appropriate ?  ?Intervention Topic:  Social Skills  ?  ?Discussion/Intervention:  ?Group content on today was focused on social skills. The group defined social skills and identified ways they use social skills. Patients expressed what obstacles they face when trying to be social. Participants described the importance of social skills. The group listed ways to improve social skills and reasons to improve social skills. Individuals had an opportunity to learn new and improve social skills as well as identify their weaknesses. ?  ?Clinical Observations/Feedback: ?Patient came to group and expressed past experiences with socializing. Individual was social with peers and staff while participating in the intervention.   ?Tylan Briguglio LRT/CTRS  ?  ?  ?   ?  ?  ? ? ? ? ? ? ? ?Darrly Loberg ?05/28/2021 3:13 PM ?

## 2021-05-28 NOTE — BHH Counselor (Addendum)
CSW contacted Kennyth Lose from [employer],  [phone], regarding pt discharge. CSW stated that PPD results need documentation and once finalized will be faxed over ASAP. She stated that she would ask her director if Friday discharge would be possible and get back to [place] regarding date. ? ?Bracy Pepper [place], MSW, LCSW-A ?4/26/20233:55 PM  ?

## 2021-05-29 DIAGNOSIS — F312 Bipolar disorder, current episode manic severe with psychotic features: Secondary | ICD-10-CM | POA: Diagnosis not present

## 2021-05-29 NOTE — Progress Notes (Signed)
Patient alert and oriented times 3. Patient is calm and pleasant. Denies pain at this time. Speech is soft and logical. Denies AVH, SI, and HI. Denies anxiety and depression. Uses walker. States he slept good last night. Morning meds given whole by mouth W/O difficulty. Ate breakfast in day room- appetite good. Patient remains on unit with Q15 minute checks in place.  ?  ?

## 2021-05-29 NOTE — Progress Notes (Signed)
Recreation Therapy Notes ? ?Date: 05/29/2021 ? ?Time: 1:40 PM   ? ?Location: Craft room   ? ?Behavioral response: Appropriate ? ?Intervention Topic: Wellness     ? ?Discussion/Intervention:  ?Group content today was focused on Wellness. The group defined wellness and some positive ways they make decisions for themselves. Individuals expressed reasons why they neglected any wellness in the past. Patients described ways to improve wellness skills in the future. The group explained what could happen if they did not do any wellness at all. Participants express how bad choices has affected them and others around them. Individual explained the importance of wellness. The group participated in the intervention ?Testing my Wellness? where they had a chance to identify some of their weaknesses and strengths in wellness.  ?Clinical Observations/Feedback: ?Patient came to group and defined wellness as confidence and communication. Individual was social with peers and staff while participating in the intervention.  ?Jeryn Bertoni LRT/CTRS  ? ? ? ? ? ? ? ? ? ? ?Donna Silverman ?05/29/2021 3:14 PM ?

## 2021-05-29 NOTE — BHH Counselor (Signed)
CSW contacted pt's daughter, Tamela Gammon to update her on the discharge date of Monday when the facility is able to take pt. CSW left voicemail with contact information.  ? ?Austin Rhodes, MSW, LCSW-A ?4/27/20231:56 PM  ?

## 2021-05-29 NOTE — Progress Notes (Signed)
[facility] MD Progress Note ? ?05/29/2021 11:06 AM ?Austin Rhodes  ?MRN:  [id number] ?Subjective: Austin Rhodes is pretty much at baseline.  He is taking his medications as prescribed denies any side effects.  He is ready to go to his assisted living and we are awaiting input from social work.  He is doing better since starting him on Prolixin.  He has no complaints and no issues.  He remains pleasantly delusional. ? ?Principal Problem: Bipolar I disorder, current or most recent episode manic, with psychotic features (HCC) ?Diagnosis: Principal Problem: ?  Bipolar I disorder, current or most recent episode manic, with psychotic features (HCC) ?Active Problems: ?  Severe manic bipolar 1 disorder with psychotic behavior (HCC) ?  Bipolar 1 disorder (HCC) ? ?Total Time spent with patient: 15 minutes ? ?Past Psychiatric History:  [employer]  ? ?Past Medical History:  ?Past Medical History:  ?Diagnosis Date  ? Bipolar disorder (HCC)   ? Coronary artery disease   ? GERD (gastroesophageal reflux disease)   ? History of multiple strokes   ? PTSD (post-traumatic stress disorder)   ?  ?Past Surgical History:  ?Procedure Laterality Date  ? CARDIAC CATHETERIZATION    ? ?Family History: History reviewed. No pertinent family history. ? ?Social History:  ?Social History  ? ?Substance and Sexual Activity  ?Alcohol Use No  ?   ?Social History  ? ?Substance and Sexual Activity  ?Drug Use Never  ?  ?Social History  ? ?Socioeconomic History  ? Marital status: Divorced  ?  Spouse name: Not on file  ? Number of children: Not on file  ? Years of education: Not on file  ? Highest education level: Not on file  ?Occupational History  ? Not on file  ?Tobacco Use  ? Smoking status: Never  ? Smokeless tobacco: Never  ?Vaping Use  ? Vaping Use: Never used  ?Substance and Sexual Activity  ? Alcohol use: No  ? Drug use: Never  ? Sexual activity: Not on file  ?Other Topics Concern  ? Not on file  ?Social History Narrative  ?  Not on file  ? ?Social Determinants of Health  ? ?Financial Resource Strain: Not on file  ?Food Insecurity: Not on file  ?Transportation Needs: Not on file  ?Physical Activity: Not on file  ?Stress: Not on file  ?Social Connections: Not on file  ? ?Additional Social History:  ?  ?  ?  ?  ?  ?  ?  ?  ?  ?  ?  ? ?Sleep: Good ? ?Appetite:  Good ? ?Current Medications: ?Current Facility-Administered Medications  ?Medication Dose Route Frequency Provider Last Rate Last Admin  ? acetaminophen (TYLENOL) tablet 650 mg  650 mg Oral Q6H PRN Sarina Ill, DO   650 mg at 05/28/21 2125  ? albuterol (PROVENTIL) (2.5 MG/3ML) 0.083% nebulizer solution 3 mL  3 mL Inhalation Q4H PRN Sarina Ill, DO      ? alum & mag hydroxide-simeth (MAALOX/MYLANTA) 200-200-20 MG/5ML suspension 30 mL  30 mL Oral Q4H PRN Sarina Ill, DO      ? chlorproMAZINE (THORAZINE) tablet 50 mg  50 mg Oral Q4H PRN Sarina Ill, DO   50 mg at 05/13/21 2129  ? Or  ? chlorproMAZINE (THORAZINE) injection 50 mg  50 mg Intramuscular Q4H PRN Sarina Ill, DO   50 mg at 05/01/21 5284  ? clopidogrel (PLAVIX) tablet 75 mg  75 mg Oral Daily Sarina Ill, DO  75 mg at 05/29/21 0929  ? divalproex (DEPAKOTE ER) 24 hr tablet 1,000 mg  1,000 mg Oral QHS Sarina Ill, DO   1,000 mg at 05/28/21 2126  ? fluPHENAZine (PROLIXIN) tablet 2.5 mg  2.5 mg Oral Q breakfast Clapacs, Jackquline Denmark, MD   2.5 mg at 05/29/21 7619  ? fluPHENAZine (PROLIXIN) tablet 7.5 mg  7.5 mg Oral QHS Sarina Ill, DO   7.5 mg at 05/28/21 2126  ? lamoTRIgine (LAMICTAL) tablet 50 mg  50 mg Oral BID Sarina Ill, DO   50 mg at 05/29/21 5093  ? LORazepam (ATIVAN) tablet 1 mg  1 mg Oral Q4H PRN Sarina Ill, DO   1 mg at 05/05/21 2671  ? losartan (COZAAR) tablet 100 mg  100 mg Oral Daily Sarina Ill, DO   100 mg at 05/29/21 0930  ? magnesium hydroxide (MILK OF MAGNESIA) suspension 30 mL  30 mL  Oral Daily PRN Vanetta Mulders, NP      ? QUEtiapine (SEROQUEL) tablet 400 mg  400 mg Oral QHS Sarina Ill, DO   400 mg at 05/28/21 2126  ? rosuvastatin (CRESTOR) tablet 10 mg  10 mg Oral Daily Sarina Ill, DO   10 mg at 05/29/21 2458  ? traZODone (DESYREL) tablet 100 mg  100 mg Oral QHS PRN Sarina Ill, DO   100 mg at 05/14/21 2207  ? tuberculin injection 5 Units  5 Units Intradermal Once Sarina Ill, DO      ? ? ?Lab Results: No results found for this or any previous visit (from the past 48 hour(s)). ? ?Blood Alcohol level:  ?Lab Results  ?Component Value Date  ? ETH <10 04/28/2021  ? ETH <10 03/13/2020  ? ? ?Metabolic Disorder Labs: ?Lab Results  ?Component Value Date  ? HGBA1C 5.7 (H) 05/06/2021  ? MPG 117 05/06/2021  ? MPG 105.41 04/28/2021  ? ?No results found for: PROLACTIN ?Lab Results  ?Component Value Date  ? CHOL 103 05/06/2021  ? TRIG 66 05/06/2021  ? HDL 41 05/06/2021  ? CHOLHDL 2.5 05/06/2021  ? VLDL 13 05/06/2021  ? LDLCALC 49 05/06/2021  ? LDLCALC 40 03/15/2020  ? ? ?Physical Findings: ?AIMS:  , ,  ,  ,    ?CIWA:    ?COWS:    ? ?Musculoskeletal: ?Strength & Muscle Tone: within normal limits ?Gait & Station: normal ?Patient leans: N/A ? ?Psychiatric Specialty Exam: ? ?Presentation  ?General Appearance: Disheveled ? ?Eye Contact:Fair ? ?Speech:Slow ? ?Speech Volume:Decreased ? ?Handedness:Right ? ? ?Mood and Affect  ?Mood:Angry; Anxious; Irritable ? ?Affect:Restricted ? ? ?Thought Process  ?Thought Processes:Disorganized ? ?Descriptions of Associations:Loose ? ?Orientation:Partial ? ?Thought Content:Delusions; Illogical; Paranoid Ideation; Perseveration; Scattered ? ?History of Schizophrenia/Schizoaffective disorder:No ? ?Duration of Psychotic Symptoms:Greater than six months ? ?Hallucinations:No data recorded ?Ideas of Reference:Paranoia ? ?Suicidal Thoughts:No data recorded ?Homicidal Thoughts:No data recorded ? ?Sensorium  ?Memory:Immediate Poor;  Recent Poor; Remote Poor ? ?Judgment:Impaired ? ?Insight:Poor ? ? ?Executive Functions  ?Concentration:Poor ? ?Attention Span:Poor ? ?Recall:Poor ? ?Fund of Knowledge:Poor ? ?Language:Poor ? ? ?Psychomotor Activity  ?Psychomotor Activity:No data recorded ? ?Assets  ?Assets:Resilience ? ? ?Sleep  ?Sleep:No data recorded ? ? ?Physical Exam: ?Physical Exam ?Vitals and nursing note reviewed.  ?Constitutional:   ?   Appearance: Normal appearance. He is normal weight.  ?Neurological:  ?   General: No focal deficit present.  ?   Mental Status: He is alert and oriented to person, place,  and time.  ?Psychiatric:     ?   Mood and Affect: Mood and affect normal.     ?   Speech: Speech is tangential.     ?   Behavior: Behavior normal. Behavior is cooperative.     ?   Thought Content: Thought content is paranoid and delusional.     ?   Cognition and Memory: Cognition and memory normal.     ?   Judgment: Judgment is impulsive and inappropriate.  ? ?Review of Systems  ?Constitutional: Negative.   ?HENT: Negative.    ?Eyes: Negative.   ?Respiratory: Negative.    ?Cardiovascular: Negative.   ?Gastrointestinal: Negative.   ?Genitourinary: Negative.   ?Musculoskeletal: Negative.   ?Skin: Negative.   ?Neurological: Negative.   ?Endo/Heme/Allergies: Negative.   ?Psychiatric/Behavioral: Negative.    ?Blood pressure 116/88, pulse 78, temperature 97.6 ?F (36.4 ?C), temperature source Oral, resp. rate 18, height 5\' 8"  (1.727 m), weight 76.2 kg, SpO2 100 %. Body mass index is 25.54 kg/m?. ? ? ?Treatment Plan Summary: ?Daily contact with patient to assess and evaluate symptoms and progress in treatment, Medication management, and Plan continue current medications.  He is ready for discharge with social work has transportation arranged. ? ?Sarina Illichard Edward Tajha Sammarco, DO ?05/29/2021, 11:06 AM ? ?

## 2021-05-29 NOTE — Progress Notes (Signed)
Rt forearm TB skin test, resulted negative. ?

## 2021-05-29 NOTE — [facility] Group Notes (Signed)
[facility] Group Notes:  (Nursing/MHT/Case Management/Adjunct) ? ?Date:  05/29/2021  ?Time:  11:38 AM ? ?Type of Therapy:  Psychoeducational Skills ? ?Participation Level:  Did Not Attend ? ? ?Summary of Progress/Problems: ? ?Austin Rhodes ?05/29/2021, 11:38 AM ?

## 2021-05-29 NOTE — B[facility] Counselor (Signed)
CSW contacted Annice Pih from [employer],  [phone], regarding pt discharge. She stated that her director Tammy could take pt on Monday. CSW stated that PPD results need documentation and once finalized will be faxed over ASAP. CSW will follow up with pt's daughter regarding discharge transportation.  ? ?Landy Dunnavant [place], MSW, LCSW-A ?4/27/20231:50 PM  ?

## 2021-05-30 DIAGNOSIS — F312 Bipolar disorder, current episode manic severe with psychotic features: Secondary | ICD-10-CM | POA: Diagnosis not present

## 2021-05-30 NOTE — Progress Notes (Signed)
?   05/29/21 2000  ?Psych Admission Type (Psych Patients Only)  ?Admission Status Involuntary  ?Psychosocial Assessment  ?Patient Complaints None  ?Eye Contact Fair  ?Facial Expression Other (Comment) ?(appropriate)  ?Affect Appropriate to circumstance  ?Speech Logical/coherent;Soft  ?Interaction Assertive  ?Motor Activity Slow;Shuffling  ?Appearance/Hygiene Unremarkable  ?Behavior Characteristics Cooperative  ?Mood Pleasant  ?Thought Process  ?Coherency WDL  ?Content Hypochondria  ?Delusions None reported or observed  ?Perception WDL  ?Hallucination None reported or observed  ?Judgment Impaired  ?Confusion None  ?Danger to Self  ?Current suicidal ideation? Denies  ?Danger to Others  ?Danger to Others None reported or observed  ? ?Pt seen in his room. Pt denies SI, HI, AVH and pain. Pt denies anxiety and depression. Pt had visit from his daughter today. Daughter states that she received a call from Child psychotherapist. Pt will be able to go to ALF on Monday, which is the first of the month. Pt seems okay with this.  ?Pt given scheduled medications as prescribed. 15 minute safety checks for patient safety. ?Pt safe on unit. ?

## 2021-05-30 NOTE — Plan of Care (Signed)
Patient alert and oriented times 3. Patient is calm and pleasant. Denies pain at this time. Speech is soft and logical. Denies AVH, SI, and HI. Denies anxiety and depression. Uses walker. States he slept good last night. Morning meds given whole by mouth W/O difficulty. Ate breakfast in day room- appetite good. Patient remains on unit with Q15 minute checks in place.  ? ? ?Problem: Education: ?Goal: Knowledge of [place] [facility] Education information/materials will improve ?Outcome: Progressing ?Goal: Mental status will improve ?Outcome: Progressing ?  ?Problem: Health Behavior/Discharge Planning: ?Goal: Identification of resources available to assist in meeting health care needs will improve ?Outcome: Progressing ?Goal: Compliance with treatment plan for underlying cause of condition will improve ?Outcome: Progressing ?  ?Problem: Safety: ?Goal: Periods of time without injury will increase ?Outcome: Progressing ?  ?Problem: Education: ?Goal: Knowledge of General Education information will improve ?Description: Including pain rating scale, medication(s)/side effects and non-pharmacologic comfort measures ?Outcome: Progressing ?  ?Problem: Health Behavior/Discharge Planning: ?Goal: Ability to manage health-related needs will improve ?Outcome: Progressing ?  ?Problem: Clinical Measurements: ?Goal: Ability to maintain clinical measurements within normal limits will improve ?Outcome: Progressing ?Goal: Will remain free from infection ?Outcome: Progressing ?Goal: Diagnostic test results will improve ?Outcome: Progressing ?Goal: Respiratory complications will improve ?Outcome: Progressing ?Goal: Cardiovascular complication will be avoided ?Outcome: Progressing ?  ?Problem: Activity: ?Goal: Risk for activity intolerance will decrease ?Outcome: Progressing ?  ?Problem: Nutrition: ?Goal: Adequate nutrition will be maintained ?Outcome: Progressing ?  ?Problem: Coping: ?Goal: Level of anxiety will decrease ?Outcome:  Progressing ?  ?Problem: Elimination: ?Goal: Will not experience complications related to bowel motility ?Outcome: Progressing ?Goal: Will not experience complications related to urinary retention ?Outcome: Progressing ?  ?Problem: Pain Managment: ?Goal: General experience of comfort will improve ?Outcome: Progressing ?  ?Problem: Safety: ?Goal: Ability to remain free from injury will improve ?Outcome: Progressing ?  ?Problem: Skin Integrity: ?Goal: Risk for impaired skin integrity will decrease ?Outcome: Progressing ?  ?

## 2021-05-30 NOTE — Plan of Care (Signed)
?  Problem: Education: ?Goal: Mental status will improve ?Outcome: Progressing ?  ?Problem: Health Behavior/Discharge Planning: ?Goal: Compliance with treatment plan for underlying cause of condition will improve ?Outcome: Progressing ?  ?Problem: Safety: ?Goal: Periods of time without injury will increase ?Outcome: Progressing ?  ?Problem: Clinical Measurements: ?Goal: Ability to maintain clinical measurements within normal limits will improve ?Outcome: Progressing ?Goal: Will remain free from infection ?Outcome: Progressing ?  ?Problem: Activity: ?Goal: Risk for activity intolerance will decrease ?Outcome: Progressing ?  ?Problem: Nutrition: ?Goal: Adequate nutrition will be maintained ?Outcome: Progressing ?  ?Problem: Coping: ?Goal: Level of anxiety will decrease ?Outcome: Progressing ?  ?Problem: Pain Managment: ?Goal: General experience of comfort will improve ?Outcome: Progressing ?  ?

## 2021-05-30 NOTE — BHH Counselor (Signed)
CSW sent PPD results and updated FL2 to [facility] to Ms. Annice Pih. CSW contact to confirm receipt of fax.  ? ?Scheduled discharge set for Monday 06/02/21.  ? ?No other requests were made. Conversation ended without incident.  ? ?Gracieann Stannard [place], MSW, LCSW-A ?4/28/20234:24 PM ? ?

## 2021-05-30 NOTE — Progress Notes (Signed)
[facility] ? ?05/30/2021 11:46 AM ?Austin Rhodes  ?MRN:  [id number] ?Subjective: Austin AlstromMaurice continues to do well.  He is at baseline.  He still has fixed delusions but his behavior is much better.  He is tolerating his medications and denies any side effects.  He is supposedly being discharged on Monday. ? ?Principal Problem: Bipolar I disorder, current or most recent episode manic, with psychotic features (HCC) ?Diagnosis: Principal Problem: ?  Bipolar I disorder, current or most recent episode manic, with psychotic features (HCC) ?Active Problems: ?  Severe manic bipolar 1 disorder with psychotic behavior (HCC) ?  Bipolar 1 disorder (HCC) ? ?Total Time spent with patient: 15 minutes ? ?Past [facility]: [facility]  ? ?Past Medical History:  ?Past Medical History:  ?Diagnosis Date  ? Bipolar disorder (HCC)   ? Coronary artery disease   ? GERD (gastroesophageal reflux disease)   ? History of multiple strokes   ? PTSD (post-traumatic stress disorder)   ?  ?Past Surgical History:  ?Procedure Laterality Date  ? CARDIAC CATHETERIZATION    ? ?Family History: History reviewed. No pertinent family history. ?Family Psychiatric  History: Unremarkable ?Social History:  ?Social History  ? ?Substance and Sexual Activity  ?Alcohol Use No  ?   ?Social History  ? ?Substance and Sexual Activity  ?Drug Use Never  ?  ?Social History  ? ?Socioeconomic History  ? Marital status: Divorced  ?  Spouse name: Not on file  ? Number of children: Not on file  ? Years of education: Not on file  ? Highest education level: Not on file  ?Occupational History  ? Not on file  ?Tobacco Use  ? Smoking status: Never  ? Smokeless tobacco: Never  ?Vaping Use  ? Vaping Use: Never used  ?Substance and Sexual Activity  ? Alcohol use: No  ? Drug use: Never  ? Sexual activity: Not on file  ?Other Topics Concern  ? Not on file  ?Social History Narrative  ? Not on file  ? ?Social Determinants of Health   ? ?Financial Resource Strain: Not on file  ?Food Insecurity: Not on file  ?Transportation Needs: Not on file  ?Physical Activity: Not on file  ?Stress: Not on file  ?Social Connections: Not on file  ? ?Additional Social History:  ?  ?  ?  ?  ?  ?  ?  ?  ?  ?  ?  ? ?Sleep: Good ? ?Appetite:  Good ? ?Current Medications: ?Current Facility-Administered Medications  ?Medication Dose Route Frequency Provider Last Rate Last Admin  ? acetaminophen (TYLENOL) tablet 650 mg  650 mg Oral Q6H PRN Sarina IllHerrick, Kynlie Jane Edward, DO   650 mg at 05/28/21 2125  ? albuterol (PROVENTIL) (2.5 MG/3ML) 0.083% nebulizer solution 3 mL  3 mL Inhalation Q4H PRN Sarina IllHerrick, Daryle Amis Edward, DO      ? alum & mag hydroxide-simeth (MAALOX/MYLANTA) 200-200-20 MG/5ML suspension 30 mL  30 mL Oral Q4H PRN Sarina IllHerrick, Mirl Hillery Edward, DO      ? chlorproMAZINE (THORAZINE) tablet 50 mg  50 mg Oral Q4H PRN Sarina IllHerrick, Jerine Surles Edward, DO   50 mg at 05/13/21 2129  ? Or  ? chlorproMAZINE (THORAZINE) injection 50 mg  50 mg Intramuscular Q4H PRN Sarina IllHerrick, Leniya Breit Edward, DO   50 mg at 05/01/21 [id number]  ? clopidogrel (PLAVIX) tablet 75 mg  75 mg Oral Daily Sarina IllHerrick, Sherrie Marsan Edward, DO   75 mg at 05/30/21 1000  ? divalproex (DEPAKOTE ER) 24 hr tablet  1,000 mg  1,000 mg Oral QHS Sarina Ill, DO   1,000 mg at 05/29/21 2117  ? fluPHENAZine (PROLIXIN) tablet 2.5 mg  2.5 mg Oral Q breakfast Clapacs, John T, MD   2.5 mg at 05/30/21 1000  ? fluPHENAZine (PROLIXIN) tablet 7.5 mg  7.5 mg Oral QHS Sarina Ill, DO   7.5 mg at 05/29/21 2118  ? lamoTRIgine (LAMICTAL) tablet 50 mg  50 mg Oral BID Sarina Ill, DO   50 mg at 05/30/21 1000  ? LORazepam (ATIVAN) tablet 1 mg  1 mg Oral Q4H PRN Sarina Ill, DO   1 mg at 05/05/21 8527  ? losartan (COZAAR) tablet 100 mg  100 mg Oral Daily Sarina Ill, DO   100 mg at 05/30/21 1000  ? magnesium hydroxide (MILK OF MAGNESIA) suspension 30 mL  30 mL Oral Daily PRN Vanetta Mulders, NP      ?  QUEtiapine (SEROQUEL) tablet 400 mg  400 mg Oral QHS Sarina Ill, DO   400 mg at 05/29/21 2117  ? rosuvastatin (CRESTOR) tablet 10 mg  10 mg Oral Daily Sarina Ill, DO   10 mg at 05/30/21 1000  ? traZODone (DESYREL) tablet 100 mg  100 mg Oral QHS PRN Sarina Ill, DO   100 mg at 05/14/21 2207  ? tuberculin injection 5 Units  5 Units Intradermal Once Sarina Ill, DO      ? ? ?Lab Results: No results found for this or any previous visit (from the past 48 hour(s)). ? ?Blood Alcohol level:  ?Lab Results  ?Component Value Date  ? ETH <10 04/28/2021  ? ETH <10 03/13/2020  ? ? ?Metabolic Disorder Labs: ?Lab Results  ?Component Value Date  ? HGBA1C 5.7 (H) 05/06/2021  ? MPG 117 05/06/2021  ? MPG 105.41 04/28/2021  ? ?No results found for: PROLACTIN ?Lab Results  ?Component Value Date  ? CHOL 103 05/06/2021  ? TRIG 66 05/06/2021  ? HDL 41 05/06/2021  ? CHOLHDL 2.5 05/06/2021  ? VLDL 13 05/06/2021  ? LDLCALC 49 05/06/2021  ? LDLCALC 40 03/15/2020  ? ? ?Physical Findings: ?AIMS:  , ,  ,  ,    ?CIWA:    ?COWS:    ? ?Musculoskeletal: ?Strength & Muscle Tone: within normal limits ?Gait & Station: unsteady ?Patient leans: N/A ? ?Psychiatric Specialty Exam: ? ?Presentation  ?General Appearance: Disheveled ? ?Eye Contact:Fair ? ?Speech:Slow ? ?Speech Volume:Decreased ? ?Handedness:Right ? ? ?Mood and Affect  ?Mood:Angry; Anxious; Irritable ? ?Affect:Restricted ? ? ?Thought Process  ?Thought Processes:Disorganized ? ?Descriptions of Associations:Loose ? ?Orientation:Partial ? ?Thought Content:Delusions; Illogical; Paranoid Ideation; Perseveration; Scattered ? ?History of Schizophrenia/Schizoaffective disorder:No ? ?Duration of Psychotic Symptoms:Greater than six months ? ?Hallucinations:No data recorded ?Ideas of Reference:Paranoia ? ?Suicidal Thoughts:No data recorded ?Homicidal Thoughts:No data recorded ? ?Sensorium  ?Memory:Immediate Poor; Recent Poor; Remote  Poor ? ?Judgment:Impaired ? ?Insight:Poor ? ? ?Executive Functions  ?Concentration:Poor ? ?Attention Span:Poor ? ?Recall:Poor ? ?Fund of Knowledge:Poor ? ?Language:Poor ? ? ?Psychomotor Activity  ?Psychomotor Activity:No data recorded ? ?Assets  ?Assets:Resilience ? ? ?Sleep  ?Sleep:No data recorded ? ? ?Physical Exam: ?Physical Exam ?Vitals and nursing note reviewed.  ?Constitutional:   ?   Appearance: Normal appearance. He is normal weight.  ?Neurological:  ?   General: No focal deficit present.  ?   Mental Status: He is alert and oriented to person, place, and time.  ?Psychiatric:     ?   Attention and  Perception: Attention and perception normal.     ?   Mood and Affect: Mood and affect normal.     ?   Speech: Speech normal.     ?   Behavior: Behavior normal. Behavior is cooperative.     ?   Thought Content: Thought content is delusional.     ?   Cognition and Memory: Cognition and memory normal.     ?   Judgment: Judgment normal.  ? ?Review of Systems  ?Constitutional: Negative.   ?HENT: Negative.    ?Eyes: Negative.   ?Respiratory: Negative.    ?Cardiovascular: Negative.   ?Gastrointestinal: Negative.   ?Genitourinary: Negative.   ?Musculoskeletal: Negative.   ?Skin: Negative.   ?Neurological: Negative.   ?Endo/Heme/Allergies: Negative.   ?Psychiatric/Behavioral: Negative.    ?Blood pressure 118/72, pulse 83, temperature 98 ?F (36.7 ?C), temperature source Oral, resp. rate 19, height 5\' 8"  (1.727 m), weight 76.2 kg, SpO2 100 %. Body mass index is 25.54 kg/m?. ? ? ?Treatment Plan Summary: ?Daily contact with patient to assess and evaluate symptoms and progress in treatment, Medication management, and Plan PT consult for 2 wheel rolling walker.  Continue current medications. ? ? , DO ?05/30/2021, 11:46 AM ? ?

## 2021-05-30 NOTE — BH IP Treatment Plan (Signed)
Interdisciplinary Treatment and Diagnostic Plan Update ? ?05/30/2021 ?Time of Session: 10:00AM ?Austin Rhodes ?MRN: [id number] ? ?Principal Diagnosis: Bipolar I disorder, current or most recent episode manic, with psychotic features ([place]) ? ?Secondary Diagnoses: Principal Problem: ?  Bipolar I disorder, current or most recent episode manic, with psychotic features ([place]) ?Active Problems: ?  Severe manic bipolar 1 disorder with psychotic behavior ([place]) ?  Bipolar 1 disorder ([place]) ? ? ?Current Medications:  ?Current Facility-Administered Medications  ?Medication Dose Route Frequency Provider Last Rate Last Admin  ? acetaminophen (TYLENOL) tablet 650 mg  650 mg Oral Q6H PRN Parks Ranger, DO   650 mg at 05/28/21 2125  ? albuterol (PROVENTIL) (2.5 MG/3ML) 0.083% nebulizer solution 3 mL  3 mL Inhalation Q4H PRN Parks Ranger, DO      ? alum & mag hydroxide-simeth (MAALOX/MYLANTA) 200-200-20 MG/5ML suspension 30 mL  30 mL Oral Q4H PRN Parks Ranger, DO      ? chlorproMAZINE (THORAZINE) tablet 50 mg  50 mg Oral Q4H PRN Parks Ranger, DO   50 mg at 05/13/21 2129  ? Or  ? chlorproMAZINE (THORAZINE) injection 50 mg  50 mg Intramuscular Q4H PRN Parks Ranger, DO   50 mg at 05/01/21 4680  ? clopidogrel (PLAVIX) tablet 75 mg  75 mg Oral Daily Parks Ranger, DO   75 mg at 05/30/21 1000  ? divalproex (DEPAKOTE ER) 24 hr tablet 1,000 mg  1,000 mg Oral QHS Parks Ranger, DO   1,000 mg at 05/29/21 2117  ? fluPHENAZine (PROLIXIN) tablet 2.5 mg  2.5 mg Oral Q breakfast Clapacs, John T, MD   2.5 mg at 05/30/21 1000  ? fluPHENAZine (PROLIXIN) tablet 7.5 mg  7.5 mg Oral QHS Parks Ranger, DO   7.5 mg at 05/29/21 2118  ? lamoTRIgine (LAMICTAL) tablet 50 mg  50 mg Oral BID Parks Ranger, DO   50 mg at 05/30/21 1000  ? LORazepam (ATIVAN) tablet 1 mg  1 mg Oral Q4H PRN Parks Ranger, DO   1 mg at 05/05/21 3212  ? losartan (COZAAR) tablet  100 mg  100 mg Oral Daily Parks Ranger, DO   100 mg at 05/30/21 1000  ? magnesium hydroxide (MILK OF MAGNESIA) suspension 30 mL  30 mL Oral Daily PRN Sherlon Handing, NP      ? QUEtiapine (SEROQUEL) tablet 400 mg  400 mg Oral QHS Parks Ranger, DO   400 mg at 05/29/21 2117  ? rosuvastatin (CRESTOR) tablet 10 mg  10 mg Oral Daily Parks Ranger, DO   10 mg at 05/30/21 1000  ? traZODone (DESYREL) tablet 100 mg  100 mg Oral QHS PRN Parks Ranger, DO   100 mg at 05/14/21 2207  ? tuberculin injection 5 Units  5 Units Intradermal Once Parks Ranger, DO      ? ?PTA Medications: ?Medications Prior to Admission  ?Medication Sig Dispense Refill Last Dose  ? acetaminophen (TYLENOL) 325 MG tablet Take 2 tablets (650 mg total) by mouth every 6 (six) hours as needed for mild pain or moderate pain. 30 tablet 0   ? albuterol (VENTOLIN HFA) 108 (90 Base) MCG/ACT inhaler Inhale 1-2 puffs into the lungs every 4 (four) hours as needed for wheezing or shortness of breath. 1 each 0   ? amoxicillin-clavulanate (AUGMENTIN) 875-125 MG tablet Take 1 tablet by mouth 2 (two) times daily. X 7 days 14 tablet 0   ? clopidogrel (  PLAVIX) 75 MG tablet Take 1 tablet (75 mg total) by mouth daily. 30 tablet 1   ? divalproex (DEPAKOTE ER) 500 MG 24 hr tablet Take 500 mg by mouth 3 (three) times daily.     ? divalproex (DEPAKOTE) 500 MG DR tablet Take 1 tablet (500 mg total) by mouth every morning AND 2 tablets (1,000 mg total) at bedtime. 90 tablet 1   ? fluticasone (FLONASE) 50 MCG/ACT nasal spray Place 2 sprays into both nostrils daily. 16 g 0   ? hydrOXYzine (ATARAX/VISTARIL) 50 MG tablet Take 1 tablet (50 mg total) by mouth 3 (three) times daily as needed for anxiety. 30 tablet 1   ? lamoTRIgine (LAMICTAL) 25 MG tablet Take by mouth.     ? Lamotrigine 35 x 25 MG KIT Take 25-100 mg by mouth as directed. 1 kit 0   ? metoprolol succinate (TOPROL-XL) 25 MG 24 hr tablet Take 25 mg by mouth daily.     ?  QUEtiapine (SEROQUEL) 400 MG tablet Take 1 tablet (400 mg total) by mouth at bedtime. 30 tablet 1   ? rosuvastatin (CRESTOR) 10 MG tablet Take 1 tablet (10 mg total) by mouth daily. 30 tablet 1   ? Spacer/Aero-Holding Chambers (AEROCHAMBER PLUS) inhaler Use with inhaler 1 each 2   ? tamsulosin (FLOMAX) 0.4 MG CAPS capsule Take 1 capsule (0.4 mg total) by mouth daily after supper. 30 capsule 1   ? ? ?Patient Stressors: Other: Unable to assess due to manic state   ? ?Patient Strengths: Active sense of humor  ?Communication skills  ? ?Treatment Modalities: Medication Management, Group therapy, Case management,  ?1 to 1 session with clinician, Psychoeducation, Recreational therapy. ? ? ?Physician Treatment Plan for Primary Diagnosis: Bipolar I disorder, current or most recent episode manic, with psychotic features ([place]) ?Long Term Goal(s): Improvement in symptoms so as ready for discharge  ? ?Short Term Goals: Ability to identify changes in lifestyle to reduce recurrence of condition will improve ?Ability to verbalize feelings will improve ?Ability to disclose and discuss suicidal ideas ?Ability to demonstrate self-control will improve ?Ability to identify and develop effective coping behaviors will improve ?Ability to maintain clinical measurements within normal limits will improve ?Compliance with prescribed medications will improve ?Ability to identify triggers associated with substance abuse/mental health issues will improve ? ?Medication Management: Evaluate patient's response, side effects, and tolerance of medication regimen. ? ?Therapeutic Interventions: 1 to 1 sessions, Unit Group sessions and Medication administration. ? ?Evaluation of Outcomes: Adequate for Discharge ? ?Physician Treatment Plan for Secondary Diagnosis: Principal Problem: ?  Bipolar I disorder, current or most recent episode manic, with psychotic features ([place]) ?Active Problems: ?  Severe manic bipolar 1 disorder with psychotic behavior  ([place]) ?  Bipolar 1 disorder ([place]) ? ?Long Term Goal(s): Improvement in symptoms so as ready for discharge  ? ?Short Term Goals: Ability to identify changes in lifestyle to reduce recurrence of condition will improve ?Ability to verbalize feelings will improve ?Ability to disclose and discuss suicidal ideas ?Ability to demonstrate self-control will improve ?Ability to identify and develop effective coping behaviors will improve ?Ability to maintain clinical measurements within normal limits will improve ?Compliance with prescribed medications will improve ?Ability to identify triggers associated with substance abuse/mental health issues will improve    ? ?Medication Management: Evaluate patient's response, side effects, and tolerance of medication regimen. ? ?Therapeutic Interventions: 1 to 1 sessions, Unit Group sessions and Medication administration. ? ?Evaluation of Outcomes: Adequate for Discharge ? ? ?RN  Treatment Plan for Primary Diagnosis: Bipolar I disorder, current or most recent episode manic, with psychotic features ([place]) ?Long Term Goal(s): Knowledge of disease and therapeutic regimen to maintain health will improve ? ?Short Term Goals: Ability to remain free from injury will improve, Ability to verbalize frustration and anger appropriately will improve, Ability to demonstrate self-control, Ability to participate in decision making will improve, Ability to verbalize feelings will improve, Ability to identify and develop effective coping behaviors will improve, and Compliance with prescribed medications will improve ? ?Medication Management: RN will administer medications as ordered by provider, will assess and evaluate patient's response and provide education to patient for prescribed medication. RN will report any adverse and/or side effects to prescribing provider. ? ?Therapeutic Interventions: 1 on 1 counseling sessions, Psychoeducation, Medication administration, Evaluate responses to treatment,  Monitor vital signs and CBGs as ordered, Perform/monitor CIWA, COWS, AIMS and Fall Risk screenings as ordered, Perform wound care treatments as ordered. ? ?Evaluation of Outcomes: Adequate for Discharge ? ? ?[place]

## 2021-05-30 NOTE — Progress Notes (Signed)
?   05/30/21 2015  ?Psych Admission Type (Psych Patients Only)  ?Admission Status Involuntary  ?Psychosocial Assessment  ?Patient Complaints None  ?Eye Contact Fair  ?Facial Expression Other (Comment) ?(appropriate)  ?Affect Appropriate to circumstance  ?Speech Logical/coherent  ?Interaction Assertive  ?Motor Activity Slow;Shuffling  ?Appearance/Hygiene Unremarkable  ?Behavior Characteristics Cooperative  ?Mood Pleasant  ?Thought Process  ?Coherency WDL  ?Content WDL  ?Delusions None reported or observed  ?Perception WDL  ?Hallucination None reported or observed  ?Judgment Impaired  ?Confusion None  ?Danger to Self  ?Current suicidal ideation? Denies  ?Danger to Others  ?Danger to Others None reported or observed  ? ?Pt seen in his room. Pt daughter did not visit today. Pt is ready for discharge on Monday. Pt denies any complaints today. Pt denies SI, HI, AVH, pain. Pt denies anxiety and depression. Pt is pleasant. Endorses good appetite and sleep. ?Pt given scheduled medications as prescribed. 15 minute checks for safety. ?Pt safe on unit. ?

## 2021-05-31 DIAGNOSIS — F312 Bipolar disorder, current episode manic severe with psychotic features: Secondary | ICD-10-CM | POA: Diagnosis not present

## 2021-05-31 NOTE — Progress Notes (Signed)
[facility] MD Progress Note ? ?05/31/2021 3:14 PM ?Austin Rhodes  ?MRN:  [id number] ?Principal Problem: Bipolar I disorder, current or most recent episode manic, with psychotic features (HCC) ?Diagnosis: Principal Problem: ?  Bipolar I disorder, current or most recent episode manic, with psychotic features (HCC) ?Active Problems: ?  Severe manic bipolar 1 disorder with psychotic behavior (HCC) ?  Bipolar 1 disorder (HCC) ? ?Patient is a  71y.o. male who presents to the [facility] unit due to psychosis. ? ? ?Interval History ?Patient was seen today for re-evaluation.  Nursing reports no events overnight. The patient has no issues with performing ADLs.  Patient has been medication compliant.   ? ?Subjective:  On assessment patient reports "I am doing good". No complaints. Denies feeling depressed, anxious, suicidal, homicidal. He still has fixed delusions that is his baseline.  He is tolerating his medications and denies any side effects.  He is supposedly being discharged on Monday. ? ?Total Time spent with patient: 20 minutes ? ?Past Psychiatric History: see H&P ? ?Past Medical History:  ?Past Medical History:  ?Diagnosis Date  ? Bipolar disorder (HCC)   ? Coronary artery disease   ? GERD (gastroesophageal reflux disease)   ? History of multiple strokes   ? PTSD (post-traumatic stress disorder)   ?  ?Past Surgical History:  ?Procedure Laterality Date  ? CARDIAC CATHETERIZATION    ? ?Family History: History reviewed. No pertinent family history. ?Family Psychiatric  History: see H&P ?Social History:  ?Social History  ? ?Substance and Sexual Activity  ?Alcohol Use No  ?   ?Social History  ? ?Substance and Sexual Activity  ?Drug Use Never  ?  ?Social History  ? ?Socioeconomic History  ? Marital status: Divorced  ?  Spouse name: Not on file  ? Number of children: Not on file  ? Years of education: Not on file  ? Highest education level: Not on file  ?Occupational History  ? Not on file  ?Tobacco Use  ? Smoking status: Never  ?  Smokeless tobacco: Never  ?Vaping Use  ? Vaping Use: Never used  ?Substance and Sexual Activity  ? Alcohol use: No  ? Drug use: Never  ? Sexual activity: Not on file  ?Other Topics Concern  ? Not on file  ?Social History Narrative  ? Not on file  ? ?Social Determinants of Health  ? ?Financial Resource Strain: Not on file  ?Food Insecurity: Not on file  ?Transportation Needs: Not on file  ?Physical Activity: Not on file  ?Stress: Not on file  ?Social Connections: Not on file  ? ?Additional Social History:  ?  ?  ?  ?  ?  ?  ?  ?  ?  ?  ?  ? ?Sleep: Good ? ?Appetite:  Good ? ?Current Medications: ?Current Facility-Administered Medications  ?Medication Dose Route Frequency Provider Last Rate Last Admin  ? acetaminophen (TYLENOL) tablet 650 mg  650 mg Oral Q6H PRN Sarina Ill, DO   650 mg at 05/28/21 2125  ? albuterol (PROVENTIL) (2.5 MG/3ML) 0.083% nebulizer solution 3 mL  3 mL Inhalation Q4H PRN Sarina Ill, DO      ? alum & mag hydroxide-simeth (MAALOX/MYLANTA) 200-200-20 MG/5ML suspension 30 mL  30 mL Oral Q4H PRN Sarina Ill, DO      ? chlorproMAZINE (THORAZINE) tablet 50 mg  50 mg Oral Q4H PRN Sarina Ill, DO   50 mg at 05/13/21 2129  ? Or  ? chlorproMAZINE (THORAZINE)  injection 50 mg  50 mg Intramuscular Q4H PRN Sarina IllHerrick, Richard Edward, DO   50 mg at 05/01/21 [id number]  ? clopidogrel (PLAVIX) tablet 75 mg  75 mg Oral Daily Sarina IllHerrick, Richard Edward, DO   75 mg at 05/31/21 0913  ? divalproex (DEPAKOTE ER) 24 hr tablet 1,000 mg  1,000 mg Oral QHS Sarina IllHerrick, Richard Edward, DO   1,000 mg at 05/30/21 2108  ? fluPHENAZine (PROLIXIN) tablet 2.5 mg  2.5 mg Oral Q breakfast Clapacs, John T, MD   2.5 mg at 05/31/21 1008  ? fluPHENAZine (PROLIXIN) tablet 7.5 mg  7.5 mg Oral QHS Sarina IllHerrick, Richard Edward, DO   7.5 mg at 05/30/21 2111  ? lamoTRIgine (LAMICTAL) tablet 50 mg  50 mg Oral BID Sarina IllHerrick, Richard Edward, DO   50 mg at 05/31/21 0913  ? LORazepam (ATIVAN) tablet 1 mg  1 mg Oral Q4H  PRN Sarina IllHerrick, Richard Edward, DO   1 mg at 05/05/21 [id number]  ? losartan (COZAAR) tablet 100 mg  100 mg Oral Daily Sarina IllHerrick, Richard Edward, DO   100 mg at 05/31/21 [id number]  ? magnesium hydroxide (MILK OF MAGNESIA) suspension 30 mL  30 mL Oral Daily PRN Vanetta MuldersBarthold, Louise F, NP      ? QUEtiapine (SEROQUEL) tablet 400 mg  400 mg Oral QHS Sarina IllHerrick, Richard Edward, DO   400 mg at 05/30/21 2109  ? rosuvastatin (CRESTOR) tablet 10 mg  10 mg Oral Daily Sarina IllHerrick, Richard Edward, DO   10 mg at 05/31/21 0913  ? traZODone (DESYREL) tablet 100 mg  100 mg Oral QHS PRN Sarina IllHerrick, Richard Edward, DO   100 mg at 05/14/21 2207  ? tuberculin injection 5 Units  5 Units Intradermal Once Sarina IllHerrick, Richard Edward, DO      ? ? ?Lab Results: No results found for this or any previous visit (from the past 48 hour(s)). ? ?Blood Alcohol level:  ?Lab Results  ?Component Value Date  ? ETH <10 04/28/2021  ? ETH <10 03/13/2020  ? ? ?Metabolic Disorder Labs: ?Lab Results  ?Component Value Date  ? HGBA1C 5.7 (H) 05/06/2021  ? MPG 117 05/06/2021  ? MPG 105.41 04/28/2021  ? ?No results found for: PROLACTIN ?Lab Results  ?Component Value Date  ? CHOL 103 05/06/2021  ? TRIG 66 05/06/2021  ? HDL 41 05/06/2021  ? CHOLHDL 2.5 05/06/2021  ? VLDL 13 05/06/2021  ? LDLCALC 49 05/06/2021  ? LDLCALC 40 03/15/2020  ? ? ?Physical Findings: ?AIMS:  , ,  ,  ,    ?CIWA:    ?COWS:    ? ?Musculoskeletal: ?Strength & Muscle Tone: within normal limits ?Gait & Station: normal ?Patient leans: N/A ? ?Psychiatric Specialty Exam: ? ?Presentation  ?General Appearance: Disheveled ? ?Eye Contact:Fair ? ?Speech:Slow ? ?Speech Volume:Decreased ? ?Handedness:Right ? ? ?Mood and Affect  ?Mood: euthymic ?Affect:Restricted ? ? ?Thought Process  ?Thought Processes: better organized ?Descriptions of Associations:Loose ? ?Orientation: full ?Thought Content: fixed delusions ?History of Schizophrenia/Schizoaffective disorder:No ? ?Duration of Psychotic Symptoms:Greater than six months ? ?Hallucinations:  denies ?Ideas of Reference:Paranoia ? ?Suicidal Thoughts: denies ?Homicidal Thoughts: denies ? ?Sensorium  ?Memory:Immediate Poor; Recent Poor; Remote Poor ? ?Judgment: limited ?Insight: limited ? ?Executive Functions  ?Concentration:Poor ? ?Attention Span:Poor ? ?Recall:Poor ? ?Fund of Knowledge:Poor ? ?Language: fair ? ?Psychomotor Activity  ?Psychomotor Activity:No data recorded ? ?Assets  ?Assets:Resilience ? ? ?Sleep  ?Sleep:No data recorded ? ? ?Physical Exam: ?Physical Exam ?ROS ?Blood pressure 122/86, pulse 73, temperature 98.1 ?F (36.7 ?C), temperature source Oral,  resp. rate 18, height 5\' 8"  (1.727 m), weight 76.2 kg, SpO2 99 %. Body mass index is 25.54 kg/m?. ? ? ?Treatment Plan Summary: ?Daily contact with patient to assess and evaluate symptoms and progress in treatment and Medication management ? ?Patient is a 71 year old male with the above-stated past psychiatric history who is seen in follow-up.  Chart reviewed. Patient discussed with nursing. ?Patient continues to express mental stability. Likely d/c on Monday. No medication changes today. ?  ?Plan: ? ?-continue inpatient psych admission; 15-minute checks; daily contact with patient to assess and evaluate symptoms and progress in treatment; psychoeducation. ? ?-continue scheduled medications: ? clopidogrel  75 mg Oral Daily  ? divalproex  1,000 mg Oral QHS  ? fluPHENAZine  2.5 mg Oral Q breakfast  ? fluPHENAZine  7.5 mg Oral QHS  ? lamoTRIgine  50 mg Oral BID  ? losartan  100 mg Oral Daily  ? QUEtiapine  400 mg Oral QHS  ? rosuvastatin  10 mg Oral Daily  ? tuberculin  5 Units Intradermal Once  ? ? ?-continue PRN medications. ? acetaminophen, albuterol, alum & mag hydroxide-simeth, chlorproMAZINE **OR** chlorproMAZINE (THORAZINE) injection, LORazepam, magnesium hydroxide, traZODone ? ?-Disposition: Estimated duration of hospitalization: early next week.  All necessary aftercare will be arranged prior to discharge. ? ?-  I certify that the patient  does need, on a daily basis, active treatment furnished directly by or requiring the supervision of inpatient psychiatric facility personnel.   ? ?Friday, MD ?05/31/2021, 3:14 PM ? ?

## 2021-05-31 NOTE — Plan of Care (Signed)
Mostly in room but getting out to the dayroom for meals. Alert and oriented. Reported feeling sleepy, wanting to get some rest.  ?

## 2021-05-31 NOTE — Plan of Care (Signed)
?  Problem: Education: ?Goal: Mental status will improve ?Outcome: Progressing ?  ?Problem: Health Behavior/Discharge Planning: ?Goal: Identification of resources available to assist in meeting health care needs will improve ?Outcome: Progressing ?  ?Problem: Safety: ?Goal: Periods of time without injury will increase ?Outcome: Progressing ?  ?Problem: Clinical Measurements: ?Goal: Ability to maintain clinical measurements within normal limits will improve ?Outcome: Progressing ?Goal: Will remain free from infection ?Outcome: Progressing ?Goal: Cardiovascular complication will be avoided ?Outcome: Progressing ?  ?Problem: Activity: ?Goal: Risk for activity intolerance will decrease ?Outcome: Progressing ?  ?Problem: Nutrition: ?Goal: Adequate nutrition will be maintained ?Outcome: Progressing ?  ?Problem: Coping: ?Goal: Level of anxiety will decrease ?Outcome: Progressing ?  ?Problem: Skin Integrity: ?Goal: Risk for impaired skin integrity will decrease ?Outcome: Progressing ?  ?

## 2021-05-31 NOTE — Group Note (Signed)
LCSW Group Therapy Note ? ?Group Date: 05/31/2021 ?Start Time: 1300 ?End Time: 1400 ? ? ?Type of Therapy and Topic:  Group Therapy - How To Cope with Nervousness about Discharge  ? ?Participation Level:  Did Not Attend  ? ?Description of Group ?This process group involved identification of patients' feelings about discharge. Some of them are scheduled to be discharged soon, while others are new admissions, but each of them was asked to share thoughts and feelings surrounding discharge from the hospital. One common theme was that they are excited at the prospect of going home, while another was that many of them are apprehensive about sharing why they were hospitalized. Patients were given the opportunity to discuss these feelings with their peers in preparation for discharge. ? ?Therapeutic Goals ? ?Patient will identify their overall feelings about pending discharge. ?Patient will think about how they might proactively address issues that they believe will once again arise once they get home (i.e. with parents). ?Patients will participate in discussion about having hope for change. ? ? ?Summary of Patient Progress:   ?Group was not held due to staffing shortages; group will resume once staffing is adequate.  ? ? ?Therapeutic Modalities ?Cognitive Behavioral Therapy ? ? ?Monroe Toure W Alverta Caccamo, LCSWA ?05/31/2021  3:13 PM   ?

## 2021-05-31 NOTE — Progress Notes (Signed)
Patient stayed in his room for the majority of the shift. Appeared to be less distressed toward the afternoon. Did not have any additional concerns.  ?

## 2021-06-01 DIAGNOSIS — F312 Bipolar disorder, current episode manic severe with psychotic features: Secondary | ICD-10-CM | POA: Diagnosis not present

## 2021-06-01 NOTE — Progress Notes (Signed)
[facility] MD Progress Note ? ?06/01/2021 1:01 PM ?Austin Rhodes  ?MRN:  [id number] ?Principal Problem: Bipolar I disorder, current or most recent episode manic, with psychotic features (HCC) ?Diagnosis: Principal Problem: ?  Bipolar I disorder, current or most recent episode manic, with psychotic features (HCC) ?Active Problems: ?  Severe manic bipolar 1 disorder with psychotic behavior (HCC) ?  Bipolar 1 disorder (HCC) ? ?Patient is a  71y.o. male who presents to the [facility] unit due to psychosis. ? ? ?Interval History ?Patient was seen today for re-evaluation.  Nursing reports no events overnight. The patient has no issues with performing ADLs.  Patient has been medication compliant.   ? ?Subjective:  On assessment patient reports "I am doing the same, pretty good". Denies mental or physical complaints. Reports good mood. Denies feeling depressed, anxious, suicidal, homicidal. He still has fixed delusions that is his baseline.  He is tolerating his medications and denies any side effects.  He is supposedly being discharged tomorrow. ? ?Total Time spent with patient: 20 minutes ? ?Past Psychiatric History: see H&P ? ?Past Medical History:  ?Past Medical History:  ?Diagnosis Date  ? Bipolar disorder (HCC)   ? Coronary artery disease   ? GERD (gastroesophageal reflux disease)   ? History of multiple strokes   ? PTSD (post-traumatic stress disorder)   ?  ?Past Surgical History:  ?Procedure Laterality Date  ? CARDIAC CATHETERIZATION    ? ?Family History: History reviewed. No pertinent family history. ?Family Psychiatric  History: see H&P ?Social History:  ?Social History  ? ?Substance and Sexual Activity  ?Alcohol Use No  ?   ?Social History  ? ?Substance and Sexual Activity  ?Drug Use Never  ?  ?Social History  ? ?Socioeconomic History  ? Marital status: Divorced  ?  Spouse name: Not on file  ? Number of children: Not on file  ? Years of education: Not on file  ? Highest education level: Not on file  ?Occupational History   ? Not on file  ?Tobacco Use  ? Smoking status: Never  ? Smokeless tobacco: Never  ?Vaping Use  ? Vaping Use: Never used  ?Substance and Sexual Activity  ? Alcohol use: No  ? Drug use: Never  ? Sexual activity: Not on file  ?Other Topics Concern  ? Not on file  ?Social History Narrative  ? Not on file  ? ?Social Determinants of Health  ? ?Financial Resource Strain: Not on file  ?Food Insecurity: Not on file  ?Transportation Needs: Not on file  ?Physical Activity: Not on file  ?Stress: Not on file  ?Social Connections: Not on file  ? ?Additional Social History:  ?  ?  ?  ?  ?  ?  ?  ?  ?  ?  ?  ? ?Sleep: Good ? ?Appetite:  Good ? ?Current Medications: ?Current Facility-Administered Medications  ?Medication Dose Route Frequency Provider Last Rate Last Admin  ? acetaminophen (TYLENOL) tablet 650 mg  650 mg Oral Q6H PRN Sarina Ill, DO   650 mg at 05/28/21 2125  ? albuterol (PROVENTIL) (2.5 MG/3ML) 0.083% nebulizer solution 3 mL  3 mL Inhalation Q4H PRN Sarina Ill, DO      ? alum & mag hydroxide-simeth (MAALOX/MYLANTA) 200-200-20 MG/5ML suspension 30 mL  30 mL Oral Q4H PRN Sarina Ill, DO      ? chlorproMAZINE (THORAZINE) tablet 50 mg  50 mg Oral Q4H PRN Sarina Ill, DO   50 mg at 05/13/21  2129  ? Or  ? chlorproMAZINE (THORAZINE) injection 50 mg  50 mg Intramuscular Q4H PRN Sarina IllHerrick, Richard Edward, DO   50 mg at 05/01/21 [id number]  ? clopidogrel (PLAVIX) tablet 75 mg  75 mg Oral Daily Sarina IllHerrick, Richard Edward, DO   75 mg at 06/01/21 [id number]  ? divalproex (DEPAKOTE ER) 24 hr tablet 1,000 mg  1,000 mg Oral QHS Sarina IllHerrick, Richard Edward, DO   1,000 mg at 05/31/21 2207  ? fluPHENAZine (PROLIXIN) tablet 2.5 mg  2.5 mg Oral Q breakfast Clapacs, Jackquline DenmarkJohn T, MD   2.5 mg at 06/01/21 0816  ? fluPHENAZine (PROLIXIN) tablet 7.5 mg  7.5 mg Oral QHS Sarina IllHerrick, Richard Edward, DO   7.5 mg at 05/31/21 2207  ? lamoTRIgine (LAMICTAL) tablet 50 mg  50 mg Oral BID Sarina IllHerrick, Richard Edward, DO   50 mg at  06/01/21 [id number]  ? LORazepam (ATIVAN) tablet 1 mg  1 mg Oral Q4H PRN Sarina IllHerrick, Richard Edward, DO   1 mg at 05/05/21 [id number]  ? losartan (COZAAR) tablet 100 mg  100 mg Oral Daily Sarina IllHerrick, Richard Edward, DO   100 mg at 06/01/21 [id number]  ? magnesium hydroxide (MILK OF MAGNESIA) suspension 30 mL  30 mL Oral Daily PRN Vanetta MuldersBarthold, Louise F, NP      ? QUEtiapine (SEROQUEL) tablet 400 mg  400 mg Oral QHS Sarina IllHerrick, Richard Edward, DO   400 mg at 05/31/21 2206  ? rosuvastatin (CRESTOR) tablet 10 mg  10 mg Oral Daily Sarina IllHerrick, Richard Edward, DO   10 mg at 06/01/21 [id number]  ? traZODone (DESYREL) tablet 100 mg  100 mg Oral QHS PRN Sarina IllHerrick, Richard Edward, DO   100 mg at 05/14/21 2207  ? tuberculin injection 5 Units  5 Units Intradermal Once Sarina IllHerrick, Richard Edward, DO      ? ? ?Lab Results: No results found for this or any previous visit (from the past 48 hour(s)). ? ?Blood Alcohol level:  ?Lab Results  ?Component Value Date  ? ETH <10 04/28/2021  ? ETH <10 03/13/2020  ? ? ?Metabolic Disorder Labs: ?Lab Results  ?Component Value Date  ? HGBA1C 5.7 (H) 05/06/2021  ? MPG 117 05/06/2021  ? MPG 105.41 04/28/2021  ? ?No results found for: PROLACTIN ?Lab Results  ?Component Value Date  ? CHOL 103 05/06/2021  ? TRIG 66 05/06/2021  ? HDL 41 05/06/2021  ? CHOLHDL 2.5 05/06/2021  ? VLDL 13 05/06/2021  ? LDLCALC 49 05/06/2021  ? LDLCALC 40 03/15/2020  ? ? ?Physical Findings: ?AIMS:  , ,  ,  ,    ?CIWA:    ?COWS:    ? ?Musculoskeletal: ?Strength & Muscle Tone: within normal limits ?Gait & Station: normal ?Patient leans: N/A ? ?Psychiatric Specialty Exam: ? ?Presentation  ?General Appearance: Disheveled ? ?Eye Contact:Fair ? ?Speech:Slow ? ?Speech Volume:Decreased ? ?Handedness:Right ? ? ?Mood and Affect  ?Mood: euthymic ?Affect:Restricted ? ? ?Thought Process  ?Thought Processes: better organized ?Descriptions of Associations:Loose ? ?Orientation: full ?Thought Content: fixed delusions ?History of Schizophrenia/Schizoaffective disorder:No ? ?Duration of  Psychotic Symptoms:Greater than six months ? ?Hallucinations: denies ?Ideas of Reference:Paranoia ? ?Suicidal Thoughts: denies ?Homicidal Thoughts: denies ? ?Sensorium  ?Memory:Immediate Poor; Recent Poor; Remote Poor ? ?Judgment: limited ?Insight: limited ? ?Executive Functions  ?Concentration:Poor ? ?Attention Span:Poor ? ?Recall:Poor ? ?Fund of Knowledge:Poor ? ?Language: fair ? ?Psychomotor Activity  ?Psychomotor Activity:No data recorded ? ?Assets  ?Assets:Resilience ? ? ?Sleep  ?Sleep:No data recorded ? ? ?Physical Exam: ?Physical Exam ?ROS ?Blood pressure 128/82, pulse 77,  temperature (!) 97.3 ?F (36.3 ?C), temperature source Oral, resp. rate 18, height 5\' 8"  (1.727 m), weight 76.2 kg, SpO2 98 %. Body mass index is 25.54 kg/m?. ? ? ?Treatment Plan Summary: ?Daily contact with patient to assess and evaluate symptoms and progress in treatment and Medication management ? ?Patient is a 71 year old male with the above-stated past psychiatric history who is seen in follow-up.  Chart reviewed. Patient discussed with nursing. ?Patient continues to be mentally-stable. Likely d/c on tomorrow. No medication changes today. ?  ?Plan: ? ?-continue inpatient psych admission; 15-minute checks; daily contact with patient to assess and evaluate symptoms and progress in treatment; psychoeducation. ? ?-continue scheduled medications: ? clopidogrel  75 mg Oral Daily  ? divalproex  1,000 mg Oral QHS  ? fluPHENAZine  2.5 mg Oral Q breakfast  ? fluPHENAZine  7.5 mg Oral QHS  ? lamoTRIgine  50 mg Oral BID  ? losartan  100 mg Oral Daily  ? QUEtiapine  400 mg Oral QHS  ? rosuvastatin  10 mg Oral Daily  ? tuberculin  5 Units Intradermal Once  ? ? ?-continue PRN medications. ? acetaminophen, albuterol, alum & mag hydroxide-simeth, chlorproMAZINE **OR** chlorproMAZINE (THORAZINE) injection, LORazepam, magnesium hydroxide, traZODone ? ?-Disposition: Estimated duration of hospitalization: early next week.  All necessary aftercare will be  arranged prior to discharge. ? ?-  I certify that the patient does need, on a daily basis, active treatment furnished directly by or requiring the supervision of inpatient psychiatric facility personnel.

## 2021-06-01 NOTE — Group Note (Signed)
LCSW Group Therapy Note ? ?Group Date: 06/01/2021 ?Start Time: 1330 ?End Time: 1430 ? ? ?Type of Therapy and Topic:  Group Therapy - Healthy vs Unhealthy Coping Skills ? ?Participation Level:  Active  ? ?Description of Group ?The focus of this group was to determine what unhealthy coping techniques typically are used by group members and what healthy coping techniques would be helpful in coping with various problems. Patients were guided in becoming aware of the differences between healthy and unhealthy coping techniques. Patients were asked to identify 2-3 healthy coping skills they would like to learn to use more effectively. ? ?Therapeutic Goals ?Patients learned that coping is what human beings do all day long to deal with various situations in their lives ?Patients defined and discussed healthy vs unhealthy coping techniques ?Patients identified their preferred coping techniques and identified whether these were healthy or unhealthy ?Patients determined 2-3 healthy coping skills they would like to become more familiar with and use more often. ?Patients provided support and ideas to each other ? ? ?Summary of Patient Progress: Patient was present for the duration of group. Patient shared off topic and required redirection at times. 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 Patient shared that he is going home tomorrow and he misses his dog. Patient was unable to identify coping skills.  ? ?Therapeutic Modalities ?Cognitive Behavioral Therapy ?Motivational Interviewing ? ?Ileana Ladd Imari Sivertsen, LCSWA ?06/01/2021  4:32 PM   ?

## 2021-06-01 NOTE — Plan of Care (Signed)
Patient alert and oriented times 3. Patient is calm and pleasant. Denies pain at this time. Speech is soft and logical. Denies AVH, SI, and HI. Denies anxiety and depression. Uses walker. States he slept good last night. Morning meds given whole by mouth W/O difficulty. Ate breakfast in day room- appetite good. Patient remains on unit with Q15 minute checks in place.  ? ? ? ?Problem: Education: ?Goal: Knowledge of [place] [facility] Education information/materials will improve ?Outcome: Progressing ?Goal: Mental status will improve ?Outcome: Progressing ?  ?Problem: Health Behavior/Discharge Planning: ?Goal: Identification of resources available to assist in meeting health care needs will improve ?Outcome: Progressing ?Goal: Compliance with treatment plan for underlying cause of condition will improve ?Outcome: Progressing ?  ?Problem: Safety: ?Goal: Periods of time without injury will increase ?Outcome: Progressing ?  ?Problem: Education: ?Goal: Knowledge of General Education information will improve ?Description: Including pain rating scale, medication(s)/side effects and non-pharmacologic comfort measures ?Outcome: Progressing ?  ?Problem: Health Behavior/Discharge Planning: ?Goal: Ability to manage health-related needs will improve ?Outcome: Progressing ?  ?Problem: Clinical Measurements: ?Goal: Ability to maintain clinical measurements within normal limits will improve ?Outcome: Progressing ?Goal: Will remain free from infection ?Outcome: Progressing ?Goal: Diagnostic test results will improve ?Outcome: Progressing ?Goal: Respiratory complications will improve ?Outcome: Progressing ?Goal: Cardiovascular complication will be avoided ?Outcome: Progressing ?  ?Problem: Nutrition: ?Goal: Adequate nutrition will be maintained ?Outcome: Progressing ?  ?Problem: Coping: ?Goal: Level of anxiety will decrease ?Outcome: Progressing ?  ?Problem: Elimination: ?Goal: Will not experience complications related to bowel  motility ?Outcome: Progressing ?Goal: Will not experience complications related to urinary retention ?Outcome: Progressing ?  ?

## 2021-06-01 NOTE — Progress Notes (Signed)
Pt lying in bed with eyes closed; easily aroused when name called. Pt states "I'm doing pretty good." He currently denies pain, SI/HI/AVH, anxiety and depression. He describes his sleep as "great" and his appetite as "too much". No acute distress noted. ?

## 2021-06-02 DIAGNOSIS — F312 Bipolar disorder, current episode manic severe with psychotic features: Secondary | ICD-10-CM | POA: Diagnosis not present

## 2021-06-02 MED ORDER — LOSARTAN POTASSIUM 100 MG PO TABS: 100.0000 mg | ORAL_TABLET | Freq: Every day | ORAL | 3 refills | Status: AC

## 2021-06-02 MED ORDER — FLUPHENAZINE HCL 2.5 MG PO TABS: 2.5000 mg | ORAL_TABLET | Freq: Every day | ORAL | 3 refills | Status: DC

## 2021-06-02 MED ORDER — DIVALPROEX SODIUM ER 500 MG PO TB24: 1000.0000 mg | ORAL_TABLET | Freq: Every day | ORAL | 3 refills | Status: AC

## 2021-06-02 MED ORDER — LAMOTRIGINE 25 MG PO TABS: 50.0000 mg | ORAL_TABLET | Freq: Two times a day (BID) | ORAL | 3 refills | Status: AC

## 2021-06-02 MED ORDER — CLOPIDOGREL BISULFATE 75 MG PO TABS: 75.0000 mg | ORAL_TABLET | Freq: Every day | ORAL | 3 refills | Status: AC

## 2021-06-02 MED ORDER — QUETIAPINE FUMARATE 400 MG PO TABS: 400.0000 mg | ORAL_TABLET | Freq: Every day | ORAL | 3 refills | Status: AC

## 2021-06-02 MED ORDER — FLUPHENAZINE HCL 2.5 MG PO TABS: 7.5000 mg | ORAL_TABLET | Freq: Every day | ORAL | 3 refills | Status: DC

## 2021-06-02 MED ORDER — ROSUVASTATIN CALCIUM 10 MG PO TABS: 10.0000 mg | ORAL_TABLET | Freq: Every day | ORAL | 3 refills | Status: AC

## 2021-06-02 NOTE — Discharge Summary (Signed)
Physician Discharge Summary Note ? ?Patient:  Austin Rhodes is an 71 y.o., male ?MRN:  [id number] ?DOB:  03-30-1950 ?Patient phone:  [phone] (home)  ?Patient address:   ?[address] ?[place] [address],  ?Total Time spent with patient: 1 hour ? ?Date of Admission:  04/29/2021 ?Date of Discharge: 06/02/2021 ? ?Reason for Admission:  [name] is a 71 year old white male who was admitted to inpatient geriatric psychiatry for mania and delusions.  Apparently, his baseline is hypomanic.  He is a poor historian because he will not stop talking about his delusions and it is difficult to get any answers from him so most of this comes from the chart.  He speaks in somewhat of a threatening manner.  Almost like, he wants you to believe what he saying. ?Difficult to know whether he has been taking his medications. ?  ?Pt presents via [place] [facility]- ivc. Pt with manic behavior & delusional. Pt irate when police bringing back to room. 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 I'm going to have to start narrowing down the pussies. I have many celebrities coming to my party, I have to get out of here". Pt with rapid speech. Pt not responding to questions appropriately.  ?  ?Patient seen and chart was reviewed.  Patient states that he is here because there was a "drug raid" at his apartment building.  He is speaking in nonsensical, disorganized terms.  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  He is not aggressive.  Per bedside RN he took his medications.  He admits that he does have bipolar illness.  He says he takes "too much" medicine.  He states that maybe he "drank too much", however BAL is less than 10.  UDS is negative.  Patient clearly delusional, needs hospitalization for safety and stabilization, treatment. ? ?Principal Problem: Bipolar I disorder, current or most recent episode manic, with psychotic features ([place]) ?Discharge  Diagnoses: Principal Problem: ?  Bipolar I disorder, current or most recent episode manic, with psychotic features ([place]) ?Active Problems: ?  Severe manic bipolar 1 disorder with psychotic behavior ([place]) ?  Bipolar 1 disorder ([place]) ? ? ?Past Psychiatric History: Extensive ? ?Past Medical History:  ?Past Medical History:  ?Diagnosis Date  ? Bipolar disorder ([place])   ? Coronary artery disease   ? GERD (gastroesophageal reflux disease)   ? History of multiple strokes   ? PTSD (post-traumatic stress disorder)   ?  ?Past Surgical History:  ?Procedure Laterality Date  ? CARDIAC CATHETERIZATION    ? ?Family History: History reviewed. No pertinent family history. ? ?Social History:  ?Social History  ? ?Substance and Sexual Activity  ?Alcohol Use No  ?   ?Social History  ? ?Substance and Sexual Activity  ?Drug Use Never  ?  ?Social History  ? ?Socioeconomic History  ? Marital status: Divorced  ?  Spouse name: Not on file  ? Number of children: Not on file  ? Years of education: Not on file  ? Highest education level: Not on file  ?Occupational History  ? Not on file  ?Tobacco Use  ? Smoking status: Never  ? Smokeless tobacco: Never  ?Vaping Use  ? Vaping Use: Never used  ?Substance and Sexual Activity  ? Alcohol use: No  ? Drug use: Never  ? Sexual activity: Not on file  ?Other Topics Concern  ? Not on  file  ?Social History Narrative  ? Not on file  ? ?Social Determinants of Health  ? ?Financial Resource Strain: Not on file  ?Food Insecurity: Not on file  ?Transportation Needs: Not on file  ?Physical Activity: Not on file  ?Stress: Not on file  ?Social Connections: Not on file  ? ? ?Hospital Course: Zef was originally admitted under involuntary commitment.  He presented very manic and hostile.  He was very threatening but he initially became more compliant with his medications and started to improve over time.  He was with [place] for approximately a month so he did go through some changes in his medications.  His blood  pressure medications were adjusted so that his pressure was normal and his pulse was normal.  He was restarted on Depakote which eventually became Depakote ER 1000 mg at bedtime.  His Lamictal was continued along with his Seroquel.  He still remained agitated and intrusive to staff and peers so Prolixin was added which ended up being adjusted to 2.5 mg in the morning and 7.5 mg at bedtime.  After this was initiated he did not need any more as needed Ativan or Thorazine.  He was sleeping well and his appetite was good.  He was pleasant and cooperative but remained delusional on the unit stating that he was captain of the [employer].  He had other delusions to which I believe them to be fixed.  His behavior improved tremendously.  He denies any side effects from his medications and there was no evidence of EPS or TD.  Eventually he was accepted at assisted living.  On the day of discharge she denied suicidal ideation, homicidal ideation, auditory or visual hallucinations.  His judgment and insight remained guarded. ? ?Physical Findings: ?AIMS:  , ,  ,  ,    ?CIWA:    ?COWS:    ? ?Musculoskeletal: ?Strength & Muscle Tone: within normal limits ?Gait & Station: unsteady ?Patient leans: N/A ? ? ?Psychiatric Specialty Exam: ? ?Presentation  ?General Appearance: Disheveled ? ?Eye Contact:Fair ? ?Speech:Slow ? ?Speech Volume:Decreased ? ?Handedness:Right ? ? ?Mood and Affect  ?Mood:Angry; Anxious; Irritable ? ?Affect:Restricted ? ? ?Thought Process  ?Thought Processes:Disorganized ? ?Descriptions of Associations:Loose ? ?Orientation:Partial ? ?Thought Content:Delusions; Illogical; Paranoid Ideation; Perseveration; Scattered ? ?History of Schizophrenia/Schizoaffective disorder:No ? ?Duration of Psychotic Symptoms:Greater than six months ? ?Hallucinations:No data recorded ?Ideas of Reference:Paranoia ? ?Suicidal Thoughts:No data recorded ?Homicidal Thoughts:No data recorded ? ?Sensorium  ?Memory:Immediate Poor; Recent Poor;  Remote Poor ? ?Judgment:Impaired ? ?Insight:Poor ? ? ?Executive Functions  ?Concentration:Poor ? ?Attention Span:Poor ? ?Recall:Poor ? ?Fund of Knowledge:Poor ? ?Language:Poor ? ? ?Psychomotor Activity  ?Psychomotor Activity:No data recorded ? ?Assets  ?Assets:Resilience ? ? ?Sleep  ?Sleep:No data recorded ? ? ?Physical Exam: ?Physical Exam ?Vitals and nursing note reviewed.  ?Constitutional:   ?   Appearance: Normal appearance. He is normal weight.  ?Neurological:  ?   General: No focal deficit present.  ?   Mental Status: He is alert and oriented to person, place, and time.  ?Psychiatric:     ?   Attention and Perception: Attention and perception normal.     ?   Mood and Affect: Mood and affect normal.     ?   Speech: Speech normal.     ?   Behavior: Behavior normal. Behavior is cooperative.     ?   Thought Content: Thought content is delusional.     ?   Cognition and Memory: Cognition is  impaired. Memory is impaired.     ?   Judgment: Judgment is impulsive.  ? ?Review of Systems  ?Constitutional: Negative.   ?HENT: Negative.    ?Eyes: Negative.   ?Respiratory: Negative.    ?Cardiovascular: Negative.   ?Gastrointestinal: Negative.   ?Genitourinary: Negative.   ?Musculoskeletal: Negative.   ?Skin: Negative.   ?Neurological: Negative.   ?Endo/Heme/Allergies: Negative.   ?Psychiatric/Behavioral: Negative.    ?Blood pressure 117/73, pulse 77, temperature 98 ?F (36.7 ?C), temperature source Oral, resp. rate 20, height 5\' 8"  (1.727 m), weight 76.2 kg, SpO2 97 %. Body mass index is 25.54 kg/m?. ? ? ?Social History  ? ?Tobacco Use  ?Smoking Status Never  ?Smokeless Tobacco Never  ? ?Tobacco Cessation:  N/A, patient does not currently use tobacco products ? ? ?Blood Alcohol level:  ?Lab Results  ?Component Value Date  ? ETH <10 04/28/2021  ? ETH <10 03/13/2020  ? ? ?Metabolic Disorder Labs:  ?Lab Results  ?Component Value Date  ? HGBA1C 5.7 (H) 05/06/2021  ? MPG 117 05/06/2021  ? MPG 105.41 04/28/2021  ? ?No results found  for: PROLACTIN ?Lab Results  ?Component Value Date  ? CHOL 103 05/06/2021  ? TRIG 66 05/06/2021  ? HDL 41 05/06/2021  ? CHOLHDL 2.5 05/06/2021  ? VLDL 13 05/06/2021  ? LDLCALC 49 05/06/2021  ? LDLCALC 40

## 2021-06-02 NOTE — Progress Notes (Signed)
Patient was pleasant and cooperative on shift. He interacted well with peers and staff on shift. He denies, SI, HI, AVH, depression and anxiety. He seemed to rest well through out the night with no new behavioral issues to report on shift at this time.  ?

## 2021-06-02 NOTE — Progress Notes (Signed)

## 2021-06-02 NOTE — Plan of Care (Signed)
?  Problem: Education: ?Goal: Knowledge of [facility] Education information/materials will improve ?06/02/2021 1827 by Nolon Bussing, RN ?Outcome: Adequate for Discharge ?06/02/2021 1054 by Nolon Bussing, RN ?Outcome: Progressing ?Goal: Mental status will improve ?06/02/2021 1827 by Nolon Bussing, RN ?Outcome: Adequate for Discharge ?06/02/2021 1054 by Nolon Bussing, RN ?Outcome: Progressing ?  ?Problem: Health Behavior/Discharge Planning: ?Goal: Identification of resources available to assist in meeting health care needs will improve ?06/02/2021 1827 by Nolon Bussing, RN ?Outcome: Adequate for Discharge ?06/02/2021 1054 by Nolon Bussing, RN ?Outcome: Progressing ?Goal: Compliance with treatment plan for underlying cause of condition will improve ?06/02/2021 1827 by Nolon Bussing, RN ?Outcome: Adequate for Discharge ?06/02/2021 1054 by Nolon Bussing, RN ?Outcome: Progressing ?  ?Problem: Safety: ?Goal: Periods of time without injury will increase ?06/02/2021 1827 by Nolon Bussing, RN ?Outcome: Adequate for Discharge ?06/02/2021 1054 by Nolon Bussing, RN ?Outcome: Progressing ?  ?Problem: Education: ?Goal: Knowledge of General Education information will improve ?Description: Including pain rating scale, medication(s)/side effects and non-pharmacologic comfort measures ?06/02/2021 1827 by Nolon Bussing, RN ?Outcome: Adequate for Discharge ?06/02/2021 1054 by Nolon Bussing, RN ?Outcome: Progressing ?  ?Problem: Health Behavior/Discharge Planning: ?Goal: Ability to manage health-related needs will improve ?06/02/2021 1827 by Nolon Bussing, RN ?Outcome: Adequate for Discharge ?06/02/2021 1054 by Nolon Bussing, RN ?Outcome: Progressing ?  ?Problem: Clinical Measurements: ?Goal: Ability to maintain clinical measurements within normal limits will improve ?06/02/2021 1827 by Nolon Bussing, RN ?Outcome: Adequate for Discharge ?06/02/2021 1054 by Nolon Bussing,  RN ?Outcome: Progressing ?Goal: Will remain free from infection ?06/02/2021 1827 by Nolon Bussing, RN ?Outcome: Adequate for Discharge ?06/02/2021 1054 by Nolon Bussing, RN ?Outcome: Progressing ?Goal: Diagnostic test results will improve ?06/02/2021 1827 by Nolon Bussing, RN ?Outcome: Adequate for Discharge ?06/02/2021 1054 by Nolon Bussing, RN ?Outcome: Progressing ?Goal: Respiratory complications will improve ?06/02/2021 1827 by Nolon Bussing, RN ?Outcome: Adequate for Discharge ?06/02/2021 1054 by Nolon Bussing, RN ?Outcome: Progressing ?Goal: Cardiovascular complication will be avoided ?06/02/2021 1827 by Nolon Bussing, RN ?Outcome: Adequate for Discharge ?06/02/2021 1054 by Nolon Bussing, RN ?Outcome: Progressing ?  ?Problem: Activity: ?Goal: Risk for activity intolerance will decrease ?06/02/2021 1827 by Nolon Bussing, RN ?Outcome: Adequate for Discharge ?06/02/2021 1054 by Nolon Bussing, RN ?Outcome: Progressing ?  ?Problem: Nutrition: ?Goal: Adequate nutrition will be maintained ?06/02/2021 1827 by Nolon Bussing, RN ?Outcome: Adequate for Discharge ?06/02/2021 1054 by Nolon Bussing, RN ?Outcome: Progressing ?  ?Problem: Coping: ?Goal: Level of anxiety will decrease ?06/02/2021 1827 by Nolon Bussing, RN ?Outcome: Adequate for Discharge ?06/02/2021 1054 by Nolon Bussing, RN ?Outcome: Progressing ?  ?Problem: Elimination: ?Goal: Will not experience complications related to bowel motility ?06/02/2021 1827 by Nolon Bussing, RN ?Outcome: Adequate for Discharge ?06/02/2021 1054 by Nolon Bussing, RN ?Outcome: Progressing ?Goal: Will not experience complications related to urinary retention ?06/02/2021 1827 by Nolon Bussing, RN ?Outcome: Adequate for Discharge ?06/02/2021 1054 by Nolon Bussing, RN ?Outcome: Progressing ?  ?Problem: Pain Managment: ?Goal: General experience of comfort will improve ?06/02/2021 1827 by Nolon Bussing, RN ?Outcome: Adequate for  Discharge ?06/02/2021 1054 by Nolon Bussing, RN ?Outcome: Progressing ?  ?Problem: Skin Integrity: ?Goal: Risk for impaired skin integrity will decrease ?06/02/2021 1827 by Nolon Bussing, RN ?Outcome: Adequate for Discharge ?06/02/2021 1054 by Nolon Bussing, RN ?Outcome: Progressing ?  ?

## 2021-06-02 NOTE — Plan of Care (Signed)
Patient alert and oriented times 3. Patient is calm and pleasant. Denies pain at this time. Speech is soft and logical. Thoughts are delusional. Denies AVH, SI, and HI. Denies anxiety and depression. Uses a walker. States he didn't get much sleep last night. Morning meds given whole by mouth W/O difficulty. Ate breakfast in day room- appetite good. Patient remains on unit with Q15 minute checks in place. . ? ? ?Problem: Education: ?Goal: Knowledge of [facility] Education information/materials will improve ?Outcome: Progressing ?Goal: Mental status will improve ?Outcome: Progressing ?  ?Problem: Health Behavior/Discharge Planning: ?Goal: Identification of resources available to assist in meeting health care needs will improve ?Outcome: Progressing ?Goal: Compliance with treatment plan for underlying cause of condition will improve ?Outcome: Progressing ?  ?Problem: Safety: ?Goal: Periods of time without injury will increase ?Outcome: Progressing ?  ?Problem: Education: ?Goal: Knowledge of General Education information will improve ?Description: Including pain rating scale, medication(s)/side effects and non-pharmacologic comfort measures ?Outcome: Progressing ?  ?Problem: Health Behavior/Discharge Planning: ?Goal: Ability to manage health-related needs will improve ?Outcome: Progressing ?  ?Problem: Clinical Measurements: ?Goal: Ability to maintain clinical measurements within normal limits will improve ?Outcome: Progressing ?Goal: Will remain free from infection ?Outcome: Progressing ?Goal: Diagnostic test results will improve ?Outcome: Progressing ?Goal: Respiratory complications will improve ?Outcome: Progressing ?Goal: Cardiovascular complication will be avoided ?Outcome: Progressing ?  ?Problem: Activity: ?Goal: Risk for activity intolerance will decrease ?Outcome: Progressing ?  ?Problem: Nutrition: ?Goal: Adequate nutrition will be maintained ?Outcome: Progressing ?  ?Problem: Coping: ?Goal: Level of  anxiety will decrease ?Outcome: Progressing ?  ?Problem: Elimination: ?Goal: Will not experience complications related to bowel motility ?Outcome: Progressing ?Goal: Will not experience complications related to urinary retention ?Outcome: Progressing ?  ?Problem: Pain Managment: ?Goal: General experience of comfort will improve ?Outcome: Progressing ?  ?Problem: Safety: ?Goal: Ability to remain free from injury will improve ?Outcome: Progressing ?  ?Problem: Skin Integrity: ?Goal: Risk for impaired skin integrity will decrease ?Outcome: Progressing ?  ?

## 2021-06-02 NOTE — Progress Notes (Addendum)
?  [facility] Adult Case Management Discharge Plan : ? ?Will you be returning to the same living situation after discharge:  No.Patient is returning to ALF - [facility] ?At discharge, do you have transportation home?: Yes,  pt's daughter is providing transportation at discharge ?Do you have the ability to pay for your medications: Yes,  pt has Medicare Part A & B ? ?Release of information consent forms completed and in the chart;  Patient's signature needed at discharge. ? ?Patient to Follow up at: ? Follow-up Information   ? ? [facility] Follow up on 06/09/2021.   ?Why: You have an appointment scheduled for hosptial follow up on Monday at 9am in person. Thanks! Please contact facility if you need to reschedule appointment. ?Contact information: ?[address] ?[place] [address] ?[phone] ? ? ?  ?  ? ?  ?  ? ?  ? ? ?Next level of care provider has access to [facility] Link:no ? ?Safety Planning and Suicide Prevention discussed: Yes, completed with pt's son, Luisdavid Hamblin ? ?  ? ?Has patient been referred to the Quitline?: N/A patient is not a smoker ? ?Patient has been referred for addiction treatment: N/A ? ?Julis Haubner A [place], LCSWA ?06/02/2021, 1:27 PM ?

## 2021-06-02 NOTE — Care Management Important Message (Signed)
Important Message ? ?Patient Details  ?Name: Austin Rhodes ?MRN: [id number] ?Date of Birth: 12-07-1950 ? ? ?Medicare Important Message Given:  Yes ? ? ? ? ?Maidie Streight A [place], LCSWA ?06/02/2021, 1:02 PM ?

## 2021-06-02 NOTE — [facility] Suicide Risk Assessment (Signed)
[facility] Discharge Suicide Risk Assessment ? ? ?Principal Problem: Bipolar I disorder, current or most recent episode manic, with psychotic features ([facility]) ?Discharge Diagnoses: Principal Problem: ?  Bipolar I disorder, current or most recent episode manic, with psychotic features ([facility]) ?Active Problems: ?  Severe manic bipolar 1 disorder with psychotic behavior ([facility]) ?  Bipolar 1 disorder ([facility]) ? ? ?Total Time spent with patient: 1 hour ? ?Musculoskeletal: ?Strength & Muscle Tone: within normal limits ?Gait & Station: unsteady ?Patient leans: N/A ? ?Psychiatric Specialty Exam ? ?Presentation  ?General Appearance: Disheveled ? ?Eye Contact:Fair ? ?Speech:Slow ? ?Speech Volume:Decreased ? ?Handedness:Right ? ? ?Mood and Affect  ?Mood:Angry; Anxious; Irritable ? ?Duration of Depression Symptoms: No data recorded ?Affect:Restricted ? ? ?Thought Process  ?Thought Processes:Disorganized ? ?Descriptions of Associations:Loose ? ?Orientation:Partial ? ?Thought Content:Delusions; Illogical; Paranoid Ideation; Perseveration; Scattered ? ?History of Schizophrenia/Schizoaffective disorder:No ? ?Duration of Psychotic Symptoms:Greater than six months ? ?Hallucinations:No data recorded ?Ideas of Reference:Paranoia ? ?Suicidal Thoughts:No data recorded ?Homicidal Thoughts:No data recorded ? ?Sensorium  ?Memory:Immediate Poor; Recent Poor; Remote Poor ? ?Judgment:Impaired ? ?Insight:Poor ? ? ?Executive Functions  ?Concentration:Poor ? ?Attention Span:Poor ? ?Recall:Poor ? ?Fund of Knowledge:Poor ? ?Language:Poor ? ? ?Psychomotor Activity  ?Psychomotor Activity:No data recorded ? ?Assets  ?Assets:Resilience ? ? ?Sleep  ?Sleep:No data recorded ? ? ?Blood pressure 117/73, pulse 77, temperature 98 ?F (36.7 ?C), temperature source Oral, resp. rate 20, height 5\' 8"  (1.727 m), weight 76.2 kg, SpO2 97 %. Body mass index is 25.54 kg/m?. ? ?Mental Status Per Nursing Assessment::   ?On Admission:  NA ? ?Demographic Factors:  ?Male, Age 42 or older,  and Caucasian ? ?Loss Factors: ?NA ? ?Historical Factors: ?Impulsivity ? ?Risk Reduction Factors:   ?Positive social support ? ?Continued Clinical Symptoms:  ?Bipolar Disorder:   Mixed State ? ?Cognitive Features That Contribute To Risk:  ?None   ? ?Suicide Risk:  ?Minimal: No identifiable suicidal ideation.  Patients presenting with no risk factors but with morbid ruminations; may be classified as minimal risk based on the severity of the depressive symptoms ? ? Follow-up Information   ? ? [facility] Follow up.   ?Contact information: ?[address] ?[place] ?[phone] ? ? ?  ?  ? ?  ?  ? ?  ? ? ?Plan Of Care/Follow-up recommendations: [facility] ? ? ?[phone], DO ?06/02/2021, 12:13 PM ?

## 2021-06-03 ENCOUNTER — Emergency Department: Payer: Medicare Other

## 2021-06-03 ENCOUNTER — Emergency Department
Admission: EM | Admit: 2021-06-03 | Discharge: 2021-06-03 | Disposition: A | Discharge status: Home / Self Care | Payer: Medicare Other | Attending: Emergency Medicine | Admitting: Emergency Medicine

## 2021-06-03 ENCOUNTER — Other Ambulatory Visit: Payer: Self-pay

## 2021-06-03 ENCOUNTER — Encounter: Payer: Self-pay | Admitting: Emergency Medicine

## 2021-06-03 DIAGNOSIS — W06XXXA Fall from bed, initial encounter: Secondary | ICD-10-CM | POA: Insufficient documentation

## 2021-06-03 DIAGNOSIS — W19XXXA Unspecified fall, initial encounter: Secondary | ICD-10-CM

## 2021-06-03 DIAGNOSIS — S8001XA Contusion of right knee, initial encounter: Secondary | ICD-10-CM | POA: Diagnosis not present

## 2021-06-03 DIAGNOSIS — S8991XA Unspecified injury of right lower leg, initial encounter: Secondary | ICD-10-CM | POA: Diagnosis present

## 2021-06-03 DIAGNOSIS — Y92003 Bedroom of unspecified non-institutional (private) residence as the place of occurrence of the external cause: Secondary | ICD-10-CM | POA: Diagnosis not present

## 2021-06-03 DIAGNOSIS — S0990XA Unspecified injury of head, initial encounter: Secondary | ICD-10-CM | POA: Diagnosis not present

## 2021-06-03 DIAGNOSIS — I10 Essential (primary) hypertension: Secondary | ICD-10-CM | POA: Diagnosis not present

## 2021-06-03 DIAGNOSIS — M25521 Pain in right elbow: Secondary | ICD-10-CM | POA: Insufficient documentation

## 2021-06-03 DIAGNOSIS — M25551 Pain in right hip: Secondary | ICD-10-CM | POA: Diagnosis not present

## 2021-06-03 LAB — COMPREHENSIVE METABOLIC PANEL
ALT: 12 U/L (ref 0–44)
AST: 16 U/L (ref 15–41)
Albumin: 3.7 g/dL (ref 3.5–5.0)
Alkaline Phosphatase: 50 U/L (ref 38–126)
Anion gap: 6 (ref 5–15)
BUN: 16 mg/dL (ref 8–23)
CO2: 30 mmol/L (ref 22–32)
Calcium: 8.9 mg/dL (ref 8.9–10.3)
Chloride: 98 mmol/L (ref 98–111)
Creatinine, Ser: 1.15 mg/dL (ref 0.61–1.24)
GFR, Estimated: >60 mL/min (ref >60)
Glucose, Bld: 104 mg/dL — ABNORMAL HIGH (ref 70–99)
Potassium: 3.9 mmol/L (ref 3.5–5.1)
Sodium: 134 mmol/L — ABNORMAL LOW (ref 135–145)
Total Bilirubin: 0.5 mg/dL (ref 0.3–1.2)
Total Protein: 6.4 g/dL — ABNORMAL LOW (ref 6.5–8.1)

## 2021-06-03 LAB — CBC WITH DIFFERENTIAL/PLATELET
Abs Immature Granulocytes: 0.03 10*3/uL (ref 0.00–0.07)
Basophils Absolute: 0.1 10*3/uL (ref 0.0–0.1)
Basophils Relative: 1 %
Eosinophils Absolute: 0.1 10*3/uL (ref 0.0–0.5)
Eosinophils Relative: 2 %
HCT: 44.1 % (ref 39.0–52.0)
Hemoglobin: 14.4 g/dL (ref 13.0–17.0)
Immature Granulocytes: 1 %
Lymphocytes Relative: 29 %
Lymphs Abs: 1.5 10*3/uL (ref 0.7–4.0)
MCH: 30.6 pg (ref 26.0–34.0)
MCHC: 32.7 g/dL (ref 30.0–36.0)
MCV: 93.6 fL (ref 80.0–100.0)
Monocytes Absolute: 0.6 10*3/uL (ref 0.1–1.0)
Monocytes Relative: 12 %
Neutro Abs: 2.9 10*3/uL (ref 1.7–7.7)
Neutrophils Relative %: 55 %
Platelets: 153 10*3/uL (ref 150–400)
RBC: 4.71 MIL/uL (ref 4.22–5.81)
RDW: 13 % (ref 11.5–15.5)
WBC: 5.3 10*3/uL (ref 4.0–10.5)
nRBC: 0 % (ref 0.0–0.2)

## 2021-06-03 NOTE — Discharge Instructions (Signed)
Your tests today were all okay. Please continue taking your medications as prescribed and follow up with your doctor as usual. ?

## 2021-06-03 NOTE — ED Notes (Signed)
Spoke with daughter Amil Amen  and [facility]     report given ?

## 2021-06-03 NOTE — ED Triage Notes (Signed)
Presents via EMS s/p fall   states he rolled out of bed  having pain to right elbow and knee  states he was not able to get up on his own  roommate helped up was ambulatory at the scene ?

## 2021-06-03 NOTE — ED Notes (Signed)
Ambulated without issues. ?

## 2021-06-03 NOTE — ED Notes (Signed)
Given slipper socks for ambulating ? ?

## 2021-06-03 NOTE — ED Provider Notes (Signed)
? ?[facility] ?Provider Note ? ? ? Event Date/Time  ? First MD Initiated Contact with Patient 06/03/21 0730   ?  (approximate) ? ? ?History  ? ?Fall (/) ? ? ?HPI ? ?Austin Rhodes is a 71 y.o. male with a past history of hypertension, bipolar disorder, GERD, schizoaffective who comes ED today complaining of right elbow pain, right hip pain, right knee pain after rolling out of bed and falling on the floor.  He states that this happened because his bed is very small, twin size.  He is not sure if he hit his head but does feel a little bit of a "buzzing" in his head without frank pain.  No neck pain.  No loss of consciousness.  No preceding symptoms such as chest pain palpitations racing heart shortness of breath abdominal pain or back pain.  He reports he was recently hospitalized for mental health issues which are now much better. ?  ? ? ?Physical Exam  ? ?Triage Vital Signs: ?ED Triage Vitals  ?Enc Vitals Group  ?   BP 06/03/21 0733 (!) 142/87  ?   Pulse Rate 06/03/21 0733 87  ?   Resp 06/03/21 0733 20  ?   Temp 06/03/21 0733 98.3 ?F (36.8 ?C)  ?   Temp Source 06/03/21 0733 Oral  ?   SpO2 06/03/21 0733 96 %  ?   Weight 06/03/21 0733 141 lb (64 kg)  ?   Height 06/03/21 0733 5\' 10"  (1.778 m)  ?   Head Circumference --   ?   Peak Flow --   ?   Pain Score 06/03/21 0741 3  ?   Pain Loc --   ?   Pain Edu? --   ?   Excl. in GC? --   ? ? ?Most recent vital signs: ?Vitals:  ? 06/03/21 0733 06/03/21 0914  ?BP: (!) 142/87 131/74  ?Pulse: 87 70  ?Resp: 20 20  ?Temp: 98.3 ?F (36.8 ?C)   ?SpO2: 96% 98%  ? ? ? ?General: Awake, no distress.  ?CV:  Good peripheral perfusion.  Regular rate and rhythm ?Resp:  Normal effort.  Clear to auscultation bilaterally ?Abd:  No distention.  Soft and nontender ?Other:  Upper extremities with full range of motion and nontender.  He does have some mild tenderness over the right patella and right hip with intact range of motion.  No crepitus or instability. ? ? ?ED  Results / Procedures / Treatments  ? ?Labs ?(all labs ordered are listed, but only abnormal results are displayed) ?Labs Reviewed  ?COMPREHENSIVE METABOLIC PANEL - Abnormal; Notable for the following components:  ?    Result Value  ? Sodium 134 (*)   ? Glucose, Bld 104 (*)   ? Total Protein 6.4 (*)   ? All other components within normal limits  ?CBC WITH DIFFERENTIAL/PLATELET  ? ? ? ?EKG ? ? ? ? ?RADIOLOGY ?CT head viewed and interpreted by me, negative for intracranial hemorrhage.  Radiology report reviewed. ? ?X-ray right hip and x-ray right knee unremarkable. ? ? ? ?PROCEDURES: ? ?Critical Care performed: No ? ?Procedures ? ? ?MEDICATIONS ORDERED IN ED: ?Medications - No data to display ? ? ?IMPRESSION / MDM / ASSESSMENT AND PLAN / ED COURSE  ?I reviewed the triage vital signs and the nursing notes. ?             ?               ? ?  Differential diagnosis includes, but is not limited to, intracranial hemorrhage, knee fracture, hip fracture, dehydration, electrolyte abnormality, anemia ? ? ? ?Patient presents with likely mechanical fall.  Will obtain CT head and x-rays for trauma evaluation.  Due to his multiple medications for mood stabilization, will obtain labs. ? ?----------------------------------------- ?9:27 AM on 06/03/2021 ?----------------------------------------- ?X-rays, CT head, labs all unremarkable.  Patient ambulatory with steady gait.  Does not require admission and is suitable for outpatient follow-up. ?  ? ? ?FINAL CLINICAL IMPRESSION(S) / ED DIAGNOSES  ? ?Final diagnoses:  ?Fall, initial encounter  ?Contusion of right knee, initial encounter  ? ? ? ?Rx / DC Orders  ? ?ED Discharge Orders   ? ? None  ? ?  ? ? ? ?Note:  This document was prepared using Dragon voice recognition software and may include unintentional dictation errors. ?  ?Sharman Cheek, MD ?06/03/21 [phone] ? ?

## 2021-06-03 NOTE — ED Notes (Signed)
Patient transported to CT 

## 2021-06-16 ENCOUNTER — Encounter: Payer: Self-pay | Admitting: Podiatry

## 2021-06-16 ENCOUNTER — Ambulatory Visit (INDEPENDENT_AMBULATORY_CARE_PROVIDER_SITE_OTHER): Payer: Medicaid Other | Admitting: Podiatry

## 2021-06-16 DIAGNOSIS — M79674 Pain in right toe(s): Secondary | ICD-10-CM | POA: Diagnosis not present

## 2021-06-16 DIAGNOSIS — B351 Tinea unguium: Secondary | ICD-10-CM | POA: Diagnosis not present

## 2021-06-16 DIAGNOSIS — M79675 Pain in left toe(s): Secondary | ICD-10-CM | POA: Diagnosis not present

## 2021-06-17 NOTE — Progress Notes (Signed)
?  Subjective:  ?Patient ID: Tadeusz Nealy, male    DOB: Jun 26, 1950,  MRN: [id number] ? ?Chief Complaint  ?Patient presents with  ? Nail Problem  ?  Thick painful toenails  ? ? ?71 y.o. male presents with the above complaint. History confirmed with patient.  He is here today with a caretaker.  Nails are thickened elongated and causing discomfort and pain in shoe gear. ? ?Objective:  ?Physical Exam: ?warm, good capillary refill, no trophic changes or ulcerative lesions, normal DP and PT pulses, and normal sensory exam. ?Left Foot: dystrophic yellowed discolored nail plates with subungual debris ?Right Foot: dystrophic yellowed discolored nail plates with subungual debris ? ?Assessment:  ? ?1. Pain due to onychomycosis of toenails of both feet   ? ? ? ?Plan:  ?Patient was evaluated and treated and all questions answered. ? ?Discussed the etiology and treatment options for the condition in detail with the patient. Educated patient on the topical and oral treatment options for mycotic nails. Recommended debridement of the nails today. Sharp and mechanical debridement performed of all painful and mycotic nails today. Nails debrided in length and thickness using a nail nipper to level of comfort. Discussed treatment options including appropriate shoe gear. Follow up as needed for painful nails. ? ? ? ?Return in about 3 months (around 09/16/2021) for painful thickened nails .  ? ?

## 2021-06-25 ENCOUNTER — Encounter: Payer: Self-pay | Admitting: Emergency Medicine

## 2021-06-25 ENCOUNTER — Ambulatory Visit
Admission: EM | Admit: 2021-06-25 | Discharge: 2021-06-25 | Disposition: A | Discharge status: Home / Self Care | Payer: Medicare Other | Attending: Emergency Medicine | Admitting: Emergency Medicine

## 2021-06-25 ENCOUNTER — Ambulatory Visit (INDEPENDENT_AMBULATORY_CARE_PROVIDER_SITE_OTHER): Payer: Medicare Other

## 2021-06-25 ENCOUNTER — Other Ambulatory Visit: Payer: Self-pay

## 2021-06-25 DIAGNOSIS — J069 Acute upper respiratory infection, unspecified: Secondary | ICD-10-CM

## 2021-06-25 DIAGNOSIS — Z20822 Contact with and (suspected) exposure to covid-19: Secondary | ICD-10-CM | POA: Diagnosis not present

## 2021-06-25 DIAGNOSIS — R051 Acute cough: Secondary | ICD-10-CM

## 2021-06-25 LAB — POCT RAPID STREP A (OFFICE): Rapid Strep A Screen: NEGATIVE

## 2021-06-25 MED ORDER — BENZONATATE 200 MG PO CAPS: 200.0000 mg | ORAL_CAPSULE | Freq: Three times a day (TID) | ORAL | 0 refills | Status: AC | PRN

## 2021-06-25 MED ORDER — GUAIFENESIN ER 600 MG PO TB12: 600.0000 mg | ORAL_TABLET | Freq: Two times a day (BID) | ORAL | 0 refills | Status: AC

## 2021-06-25 MED ORDER — GUAIFENESIN ER 600 MG PO TB12: 600.0000 mg | ORAL_TABLET | Freq: Two times a day (BID) | ORAL | 0 refills | Status: DC

## 2021-06-25 MED ORDER — FLUTICASONE PROPIONATE 50 MCG/ACT NA SUSP: 2.0000 | Freq: Every day | NASAL | 0 refills | Status: AC

## 2021-06-25 NOTE — ED Provider Notes (Incomplete)
HPI  SUBJECTIVE:  Austin Rhodes is a 71 y.o. male who presents with a cough, body aches, headache, right ear pain, nasal congestion, sore throat, postnasal drip, sinus pain and pressure, upper dental pain, loss of sense of smell and taste, clear rhinorrhea, tinnitus, wheezing, diarrhea since coming home from the [facility] ED yesterday.  No fevers, change in hearing, otorrhea, nausea, vomiting, abdominal pain.  Shortness of breath due to cough.  No other shortness of breath.  He reports a bad taste in the back of his throat, but denies other GERD symptoms.  No allergy symptoms.  No known COVID exposure.  He is unable to sleep at night because of the cough.  No antibiotics in the past 3 months.  No antipyretic in the past 6 hours.  No aggravating or alleviating factors.  He has not tried anything for this.  Patient has a past medical history of coronary artery disease, thrombocytopenia, stroke, on Plavix, bipolar disorder, GERD, schizophrenia.  Additional history obtained from daughter.  Patient lives in a group home.  Past Medical History:  Diagnosis Date   Bipolar disorder ([place])    Coronary artery disease    GERD (gastroesophageal reflux disease)    History of multiple strokes    PTSD (post-traumatic stress disorder)     Past Surgical History:  Procedure Laterality Date   CARDIAC CATHETERIZATION      History reviewed. No pertinent family history.  Social History   Tobacco Use   Smoking status: Never   Smokeless tobacco: Never  Vaping Use   Vaping Use: Never used  Substance Use Topics   Alcohol use: No   Drug use: Never    No current facility-administered medications for this encounter.  Current Outpatient Medications:    benzonatate (TESSALON) 200 MG capsule, Take 1 capsule (200 mg total) by mouth 3 (three) times daily as needed for cough., Disp: 30 capsule, Rfl: 0   clopidogrel (PLAVIX) 75 MG tablet, Take 1 tablet (75 mg total) by mouth daily., Disp: 30 tablet, Rfl: 3    divalproex (DEPAKOTE ER) 500 MG 24 hr tablet, Take 2 tablets (1,000 mg total) by mouth at bedtime., Disp: 30 tablet, Rfl: 3   fluPHENAZine (PROLIXIN) 2.5 MG tablet, Take 1 tablet (2.5 mg total) by mouth daily with breakfast., Disp: 30 tablet, Rfl: 3   fluticasone (FLONASE) 50 MCG/ACT nasal spray, Place 2 sprays into both nostrils daily., Disp: 16 g, Rfl: 0   lamoTRIgine (LAMICTAL) 25 MG tablet, Take 2 tablets (50 mg total) by mouth 2 (two) times daily., Disp: 60 tablet, Rfl: 3   losartan (COZAAR) 100 MG tablet, Take 1 tablet (100 mg total) by mouth daily., Disp: 30 tablet, Rfl: 3   QUEtiapine (SEROQUEL) 400 MG tablet, Take 1 tablet (400 mg total) by mouth at bedtime., Disp: 30 tablet, Rfl: 3   rosuvastatin (CRESTOR) 10 MG tablet, Take 1 tablet (10 mg total) by mouth daily., Disp: 30 tablet, Rfl: 3   acetaminophen (TYLENOL) 325 MG tablet, Take by mouth., Disp: , Rfl:    fluPHENAZine (PROLIXIN) 2.5 MG tablet, Take 3 tablets (7.5 mg total) by mouth at bedtime., Disp: 90 tablet, Rfl: 3   guaiFENesin (MUCINEX) 600 MG 12 hr tablet, Take 1 tablet (600 mg total) by mouth 2 (two) times daily., Disp: 20 tablet, Rfl: 0   metoprolol succinate (TOPROL-XL) 50 MG 24 hr tablet, Take 1 tablet by mouth daily., Disp: , Rfl:   Allergies  Allergen Reactions   Procaine Other (See Comments)  ROS  As noted in HPI.   Physical Exam  BP (!) 150/83   Pulse 92   Temp 98.1 F (36.7 C)   Resp 18   SpO2 95%   Constitutional: Well developed, well nourished, no acute distress.  Coughing. Eyes:  EOMI, conjunctiva normal bilaterally HENT: Normocephalic, atraumatic,mucus membranes moist extensive clear nasal congestion.  No maxillary, frontal sinus tenderness.  Tonsils surgically absent.  Positive postnasal drip. Neck: No cervical lymphadenopathy Respiratory: Normal inspiratory effort, lungs clear bilaterally Cardiovascular: Normal rate, regular rhythm, no murmurs rubs or gallops GI: nondistended skin: No rash,  skin intact Musculoskeletal: no deformities Neurologic: Alert & oriented x 3, no focal neuro deficits Psychiatric: Speech and behavior appropriate   ED Course   Medications - No data to display  Orders Placed This Encounter  Procedures   Novel Coronavirus, NAA ([facility])    Standing Status:   Standing    Number of Occurrences:   1   DG Chest 2 View    Standing Status:   Standing    Number of Occurrences:   1    Order Specific Question:   Reason for Exam (SYMPTOM  OR DIAGNOSIS REQUIRED)    Answer:   cough r/o PNA   POCT rapid strep A    Standing Status:   Standing    Number of Occurrences:   1    Results for orders placed or performed during the hospital encounter of 06/25/21 (from the past 24 hour(s))  POCT rapid strep A     Status: None   Collection Time: 06/25/21  6:56 PM  Result Value Ref Range   Rapid Strep A Screen Negative Negative   DG Chest 2 View  Result Date: 06/25/2021 CLINICAL DATA:  Productive cough EXAM: CHEST - 2 VIEW COMPARISON:  12/16/2018, 05/09/2015 FINDINGS: No focal opacity, pleural effusion or pneumothorax. Normal cardiac size. Air distended high-riding bowel beneath the right diaphragm. IMPRESSION: No active cardiopulmonary disease. Electronically Signed   By: Jasmine Pang M.D.   On: 06/25/2021 19:50    ED Clinical Impression  1. Viral upper respiratory tract infection with cough   2. Acute cough   3. Encounter for laboratory testing for COVID-19 virus      ED Assessment/Plan  Strep negative.  Checking COVID as patient will be a candidate for antivirals if it is positive.  Also checking chest x-ray as I am not sure how reliable historian he is. .  Reviewed imaging independently.  No acute changes.  See radiology report for full details.  Chest x-ray negative for pneumonia.  Presentation consistent with a viral respiratory infection. In the meantime, home with saline nasal irrigation, Flonase, Tessalon, Mucinex.  Follow-up with PCP as needed.   Written ER return precautions given. We can try Flonase, Mucinex until the pharmacy opens tomorrow for the [place].    COVID pending at the time of initial signing of this note.  Discussed labs, imaging, MDM, treatment plan, and plan for follow-up with family member and patient.  They agree with plan.   Meds ordered this encounter  Medications   benzonatate (TESSALON) 200 MG capsule    Sig: Take 1 capsule (200 mg total) by mouth 3 (three) times daily as needed for cough.    Dispense:  30 capsule    Refill:  0   DISCONTD: guaiFENesin (MUCINEX) 600 MG 12 hr tablet    Sig: Take 1 tablet (600 mg total) by mouth 2 (two) times daily.    Dispense:  14  tablet    Refill:  0   fluticasone (FLONASE) 50 MCG/ACT nasal spray    Sig: Place 2 sprays into both nostrils daily.    Dispense:  16 g    Refill:  0   guaiFENesin (MUCINEX) 600 MG 12 hr tablet    Sig: Take 1 tablet (600 mg total) by mouth 2 (two) times daily.    Dispense:  20 tablet    Refill:  0      *This clinic note was created using Lobby[employer]. Therefore, there may be occasional mistakes despite careful proofreading.  ?    Melynda Ripple, MD 06/26/21 0740    Melynda Ripple, MD 06/26/21 [phone]

## 2021-06-25 NOTE — Discharge Instructions (Addendum)
COVID test will be back in a day or 2.  We will contact you if your results are positive and we need to start you on an antiviral.  Your chest x-ray is normal tonight.  Start saline nasal irrigation with a [facility] [employer] Med rinse and distilled water as often as you want for the nasal congestion, Flonase 2 sprays in each nostril once daily, Mucinex to help with the cough and to thin out the mucus and Tessalon for the cough.  Follow-up with your doctor if not getting any better in 5 to 7 days.  Go to the ER if you get worse.

## 2021-06-25 NOTE — ED Triage Notes (Signed)
Patient c/o productive cough w/ "yellow" sputum and sore throat x 1 day.   Patient denies fever at home.   Patient endorses RT sided ear pain.   Patient endorses runny nose.   Patient hasn't taken any medications for symptoms.

## 2021-06-27 LAB — NOVEL CORONAVIRUS, NAA: SARS-CoV-2, NAA: NOT DETECTED

## 2021-07-20 ENCOUNTER — Emergency Department
Admission: EM | Admit: 2021-07-20 | Discharge: 2021-07-20 | Disposition: A | Discharge status: Home / Self Care | Payer: Medicare Other | Attending: Emergency Medicine | Admitting: Emergency Medicine

## 2021-07-20 ENCOUNTER — Other Ambulatory Visit: Payer: Self-pay

## 2021-07-20 ENCOUNTER — Emergency Department: Payer: Medicare Other

## 2021-07-20 DIAGNOSIS — K5900 Constipation, unspecified: Secondary | ICD-10-CM | POA: Insufficient documentation

## 2021-07-20 DIAGNOSIS — M549 Dorsalgia, unspecified: Secondary | ICD-10-CM | POA: Diagnosis not present

## 2021-07-20 DIAGNOSIS — R000 Tachycardia, unspecified: Secondary | ICD-10-CM | POA: Insufficient documentation

## 2021-07-20 LAB — COMPREHENSIVE METABOLIC PANEL
ALT: 12 U/L (ref 0–44)
AST: 21 U/L (ref 15–41)
Albumin: 3.7 g/dL (ref 3.5–5.0)
Alkaline Phosphatase: 54 U/L (ref 38–126)
Anion gap: 12 (ref 5–15)
BUN: 19 mg/dL (ref 8–23)
CO2: 21 mmol/L — ABNORMAL LOW (ref 22–32)
Calcium: 9.4 mg/dL (ref 8.9–10.3)
Chloride: 102 mmol/L (ref 98–111)
Creatinine, Ser: 1.16 mg/dL (ref 0.61–1.24)
GFR, Estimated: >60 mL/min (ref >60)
Glucose, Bld: 146 mg/dL — ABNORMAL HIGH (ref 70–99)
Potassium: 4.1 mmol/L (ref 3.5–5.1)
Sodium: 135 mmol/L (ref 135–145)
Total Bilirubin: 0.6 mg/dL (ref 0.3–1.2)
Total Protein: 7.1 g/dL (ref 6.5–8.1)

## 2021-07-20 LAB — CBC WITH DIFFERENTIAL/PLATELET
Abs Immature Granulocytes: 0.02 10*3/uL (ref 0.00–0.07)
Basophils Absolute: 0 10*3/uL (ref 0.0–0.1)
Basophils Relative: 0 %
Eosinophils Absolute: 0 10*3/uL (ref 0.0–0.5)
Eosinophils Relative: 0 %
HCT: 40.6 % (ref 39.0–52.0)
Hemoglobin: 13.6 g/dL (ref 13.0–17.0)
Immature Granulocytes: 0 %
Lymphocytes Relative: 14 %
Lymphs Abs: 1 10*3/uL (ref 0.7–4.0)
MCH: 30.2 pg (ref 26.0–34.0)
MCHC: 33.5 g/dL (ref 30.0–36.0)
MCV: 90 fL (ref 80.0–100.0)
Monocytes Absolute: 0.6 10*3/uL (ref 0.1–1.0)
Monocytes Relative: 9 %
Neutro Abs: 5.8 10*3/uL (ref 1.7–7.7)
Neutrophils Relative %: 77 %
Platelets: 168 10*3/uL (ref 150–400)
RBC: 4.51 MIL/uL (ref 4.22–5.81)
RDW: 13.2 % (ref 11.5–15.5)
WBC: 7.5 10*3/uL (ref 4.0–10.5)
nRBC: 0 % (ref 0.0–0.2)

## 2021-07-20 MED ORDER — DOCUSATE SODIUM 100 MG PO CAPS: 100.0000 mg | ORAL_CAPSULE | Freq: Two times a day (BID) | ORAL | 2 refills | Status: AC

## 2021-07-20 MED ORDER — IOHEXOL 300 MG/ML  SOLN
100.0000 mL | Freq: Once | INTRAMUSCULAR | Status: AC | PRN
  Administered 2021-07-20: 100 mL via INTRAVENOUS

## 2021-07-20 MED ORDER — POLYETHYLENE GLYCOL 3350 17 GM/SCOOP PO POWD: 17.0000 g | Freq: Three times a day (TID) | ORAL | 0 refills | Status: AC | PRN

## 2021-07-20 MED ORDER — POLYETHYLENE GLYCOL 3350 17 G PO PACK
17.0000 g | PACK | Freq: Every day | ORAL | Status: DC
  Administered 2021-07-20: 17 g via ORAL
  Filled 2021-07-20: qty 1

## 2021-07-20 MED ORDER — DOCUSATE SODIUM 100 MG PO CAPS
100.0000 mg | ORAL_CAPSULE | Freq: Once | ORAL | Status: AC
  Administered 2021-07-20: 100 mg via ORAL
  Filled 2021-07-20: qty 1

## 2021-07-20 NOTE — ED Notes (Signed)
Spoke to group home and daughter and they are working out who will come pick up pt

## 2021-07-20 NOTE — ED Triage Notes (Signed)
Pt states he has not had a BM in 3 days and 5 days ago he had a fall to his knees where he twisted his back- pt denies hitting his head- pt states that he has had increased weakness since his fall- pt denies difficulty urinating- pt does endorse mid back pain

## 2021-07-20 NOTE — Discharge Instructions (Signed)
Please use your MiraLAX 17 g/1 capful with a large glass of water 3 times a day until you begin having bowel movements.  Please also take your Colace capsule twice daily.  Once you begin having bowel movements you may discontinue MiraLAX but continue to take the Colace twice daily to help prevent constipation in the future.  Return to the emergency department for any abdominal pain, vomiting or any other symptom personally concerning to yourself.

## 2021-07-20 NOTE — ED Provider Triage Note (Signed)
Emergency Medicine Provider Triage Evaluation Note  Austin Rhodes , a 71 y.o. male  was evaluated in triage.  Pt complains of constipation x3 days. No abdominal pain. Denies passing gas. Denies vomiting. No history of this before. No abdominal surgery. Lives in assisted living facility. He has tried a "pill" but does not know the name of the pill. No problems urinating. Reports he had a fall while walking 5 days ago. Denies HS or LOC. Landed on his knees. Feels full body weakness since that time. Reports that he twisted back when he fell and he has had pain in his low to middle of his back since then. Fells " a little bit" distended.  Patient Active Problem List   Diagnosis Date Noted   Severe manic bipolar 1 disorder with psychotic behavior (HCC) 04/29/2021   Bipolar 1 disorder (HCC) 04/29/2021   Bipolar affective disorder, current episode manic with psychotic symptoms (HCC) 03/15/2020   HLD (hyperlipidemia) 03/28/2019   SVT (supraventricular tachycardia) (HCC) 03/28/2019   Bipolar disorder (HCC) 12/26/2018   Head injury 12/25/2018   Paranoid schizophrenia (HCC) 12/25/2018   Schizophrenia, paranoid (HCC)    Acute encephalopathy    Affective psychosis, bipolar (HCC) 12/18/2018   HTN (hypertension) 11/01/2017   Bipolar I disorder, current or most recent episode manic, with psychotic features (HCC) 10/31/2017   CAD (coronary artery disease) 08/31/2016   GERD (gastroesophageal reflux disease) 08/31/2016   History of CVA (cerebrovascular accident) 08/31/2016   History of syncope 08/31/2016   History of thrombocytopenia 08/31/2016   Prediabetes 08/31/2016   BPH (benign prostatic hyperplasia) 08/31/2016   Chest pain 05/09/2015     Review of Systems  Positive: Constipation, back pain Negative: Vomiting, abdominal pain, fever  Physical Exam  BP 117/89   Pulse (!) 115   Temp 98 F (36.7 C) (Oral)   Resp 18   SpO2 97%  Gen:   Awake, no distress   Resp:  Normal effort  MSK:    Moves extremities without difficulty  Other:  Mild lumbar tenderness. Able to flex and extend bilateral hips with 5/5 strength, normal flexion and extension at knees against resistance No abdominal tenderness, no rebound or guarding  Medical Decision Making  Medically screening exam initiated at 1:22 PM.  Appropriate orders placed.  Austin Rhodes was informed that the remainder of the evaluation will be completed by another provider, this initial triage assessment does not replace that evaluation, and the importance of remaining in the ED until their evaluation is complete.     Jackelyn Hoehn, PA-C 07/20/21 1334

## 2021-07-20 NOTE — ED Notes (Signed)
PT d/c by charge RN Shanda Bumps

## 2021-07-20 NOTE — ED Notes (Signed)
Pt to bedside toilet, pt had small hard bm, with some bleeding, md notified

## 2021-07-20 NOTE — ED Provider Notes (Signed)
[facility] Provider Note    Event Date/Time   First MD Initiated Contact with Patient 07/20/21 1539     (approximate)  History   Chief Complaint: Abdominal Pain and Weakness  HPI  Austin Rhodes is a 71 y.o. male with a past medical history of bipolar, gastric reflux, CVA, presents to the emergency department from his assisted living facility with constipation.  According to the patient for the past 3 days he has been constipated.  He states he normally has a bowel movement every day.  He states the assisted living facility will not give him any over-the-counter medications unless it is prescribed by a doctor so he came to the emergency department for his constipation.  Denies any abdominal pain.  Denies any nausea or vomiting.  As a secondary complaint patient does state approximately 1 or 2 weeks ago he had a fall but does not really recall how he fell has had some back pain since that time.  Denies any weakness or numbness of any leg or trouble urinating.  Physical Exam   Triage Vital Signs: ED Triage Vitals  Enc Vitals Group     BP 07/20/21 1319 117/89     Pulse Rate 07/20/21 1319 (!) 115     Resp 07/20/21 1319 18     Temp 07/20/21 1319 98 F (36.7 C)     Temp Source 07/20/21 1319 Oral     SpO2 07/20/21 1319 97 %     Weight 07/20/21 1331 180 lb (81.6 kg)     Height 07/20/21 1331 5\' 10"  (1.778 m)     Head Circumference --      Peak Flow --      Pain Score 07/20/21 1330 4     Pain Loc --      Pain Edu? --      Excl. in GC? --     Most recent vital signs: Vitals:   07/20/21 1319 07/20/21 1555  BP: 117/89 132/88  Pulse: (!) 115 98  Resp: 18 17  Temp: 98 F (36.7 C) 97.8 F (36.6 C)  SpO2: 97% 100%    General: Awake, no distress.  CV:  Good peripheral perfusion.  Regular rate and rhythm  Resp:  Normal effort.  Equal breath sounds bilaterally.  Abd:  No distention.  Soft, nontender.  No rebound or guarding.   ED Results /  Procedures / Treatments   EKG  EKG viewed and interpreted by myself shows sinus tachycardia 117 bpm with a narrow QRS, normal axis, normal intervals, nonspecific ST changes.  No ST elevation.  RADIOLOGY  I have personally interpreted the CT images patient appears to be constipated but no other obvious abnormality seen on my evaluation. Radiology has read the CT as large stool burden consistent with constipation no acute finding. Lumbar CT negative for acute finding   MEDICATIONS ORDERED IN ED: Medications  iohexol (OMNIPAQUE) 300 MG/ML solution 100 mL (100 mLs Intravenous Contrast Given 07/20/21 1522)     IMPRESSION / MDM / ASSESSMENT AND PLAN / ED COURSE  I reviewed the triage vital signs and the nursing notes.  Patient's presentation is most consistent with acute presentation with potential threat to life or bodily function.  Patient presents to the emergency department for 3 days of constipation.  States he was trying to have a bowel movement today was able to get some stool out but still feels constipated so he came to the emergency department.  Patient reassuringly has  a benign abdomen.  Lab work is reassuring including a normal CBC, normal chemistry.  CT imaging does appear to show constipation.  CT read as constipation.  Lumbar spine CT shows no acute finding.  Given these findings we will start the patient on a bowel regimen of MiraLAX 3 times daily and Colace twice daily until he has multiple bowel movements then he may stop the MiraLAX but continue taking Colace to prevent constipation in the future.  Patient agreeable to plan of care.  FINAL CLINICAL IMPRESSION(S) / ED DIAGNOSES   Constipation  Rx / DC Orders   MiraLAX Colace  Note:  This document was prepared using Dragon voice recognition software and may include unintentional dictation errors.   Minna Antis, MD 07/20/21 [phone]

## 2021-09-18 ENCOUNTER — Encounter: Payer: Self-pay | Admitting: Podiatry

## 2021-09-18 ENCOUNTER — Ambulatory Visit (INDEPENDENT_AMBULATORY_CARE_PROVIDER_SITE_OTHER): Payer: Medicare Other | Admitting: Podiatry

## 2021-09-18 DIAGNOSIS — B351 Tinea unguium: Secondary | ICD-10-CM | POA: Diagnosis not present

## 2021-09-18 DIAGNOSIS — M79674 Pain in right toe(s): Secondary | ICD-10-CM

## 2021-09-18 DIAGNOSIS — M79675 Pain in left toe(s): Secondary | ICD-10-CM

## 2021-09-18 NOTE — Progress Notes (Signed)

## 2021-09-19 ENCOUNTER — Encounter: Payer: Self-pay | Admitting: Emergency Medicine

## 2021-09-19 ENCOUNTER — Emergency Department
Admission: EM | Admit: 2021-09-19 | Discharge: 2021-09-19 | Disposition: A | Discharge status: Home / Self Care | Payer: Medicare Other | Attending: Emergency Medicine | Admitting: Emergency Medicine

## 2021-09-19 ENCOUNTER — Emergency Department: Payer: Medicare Other

## 2021-09-19 ENCOUNTER — Other Ambulatory Visit: Payer: Self-pay

## 2021-09-19 DIAGNOSIS — R319 Hematuria, unspecified: Secondary | ICD-10-CM | POA: Diagnosis present

## 2021-09-19 DIAGNOSIS — R300 Dysuria: Secondary | ICD-10-CM | POA: Insufficient documentation

## 2021-09-19 DIAGNOSIS — I2510 Atherosclerotic heart disease of native coronary artery without angina pectoris: Secondary | ICD-10-CM | POA: Diagnosis not present

## 2021-09-19 LAB — URINALYSIS, ROUTINE W REFLEX MICROSCOPIC
Bacteria, UA: NONE SEEN
Bilirubin Urine: NEGATIVE
Glucose, UA: NEGATIVE mg/dL
Ketones, ur: 5 mg/dL — AB
Leukocytes,Ua: NEGATIVE
Nitrite: NEGATIVE
Protein, ur: NEGATIVE mg/dL
RBC / HPF: >50 RBC/hpf — ABNORMAL HIGH (ref 0–5)
Specific Gravity, Urine: 1.015 (ref 1.005–1.030)
Squamous Epithelial / HPF: NONE SEEN (ref 0–5)
pH: 6 (ref 5.0–8.0)

## 2021-09-19 LAB — CBC
HCT: 42.7 % (ref 39.0–52.0)
Hemoglobin: 14.1 g/dL (ref 13.0–17.0)
MCH: 30.6 pg (ref 26.0–34.0)
MCHC: 33 g/dL (ref 30.0–36.0)
MCV: 92.6 fL (ref 80.0–100.0)
Platelets: 158 10*3/uL (ref 150–400)
RBC: 4.61 MIL/uL (ref 4.22–5.81)
RDW: 13.1 % (ref 11.5–15.5)
WBC: 5.6 10*3/uL (ref 4.0–10.5)
nRBC: 0 % (ref 0.0–0.2)

## 2021-09-19 LAB — BASIC METABOLIC PANEL
Anion gap: 9 (ref 5–15)
BUN: 18 mg/dL (ref 8–23)
CO2: 28 mmol/L (ref 22–32)
Calcium: 9.6 mg/dL (ref 8.9–10.3)
Chloride: 100 mmol/L (ref 98–111)
Creatinine, Ser: 0.93 mg/dL (ref 0.61–1.24)
GFR, Estimated: >60 mL/min (ref >60)
Glucose, Bld: 95 mg/dL (ref 70–99)
Potassium: 4 mmol/L (ref 3.5–5.1)
Sodium: 137 mmol/L (ref 135–145)

## 2021-09-19 MED ORDER — CEPHALEXIN 500 MG PO CAPS: 500.0000 mg | ORAL_CAPSULE | Freq: Three times a day (TID) | ORAL | 0 refills | Status: DC

## 2021-09-19 MED ORDER — CEPHALEXIN 500 MG PO CAPS
500.0000 mg | ORAL_CAPSULE | Freq: Once | ORAL | Status: AC
  Administered 2021-09-19: 500 mg via ORAL
  Filled 2021-09-19: qty 1

## 2021-09-19 NOTE — ED Triage Notes (Signed)
Pt here via [facility] from [facility] group home. Pt has some blood in his urine and it burns when he urinates. Pt is ambulatory on arrival to ED.

## 2021-09-19 NOTE — Discharge Instructions (Addendum)
Please take antibiotics as prescribed for their entire course.  Please follow-up with urology by calling the number provided to arrange a follow-up appointment soon as possible.  Return to the emergency department for any worsening pain, fever or if you are unable to urinate.

## 2021-09-19 NOTE — ED Triage Notes (Signed)
Patient c/o hematuria and dysuria onset of this morning. Reports having a burning pain to penis. Denies any trauma. Patient on blood thinners.

## 2021-09-19 NOTE — ED Provider Notes (Signed)
   [facility] Provider Note    Event Date/Time   First MD Initiated Contact with Patient 09/19/21 1804     (approximate)  History   Chief Complaint: Hematuria  HPI  Austin Rhodes is a 71 y.o. male with a past medical history of bipolar, gastric reflux, CAD presents to the emergency department with dysuria and hematuria.  According to the patient since this morning he had noticed some pain with urination and noticed blood in his urine.  Patient denies any history of hematuria previously.  Patient denies any fever.  Denies any abdominal pain.  Denies any trauma.  Physical Exam   Triage Vital Signs: ED Triage Vitals  Enc Vitals Group     BP 09/19/21 1518 (!) 136/90     Pulse Rate 09/19/21 1518 86     Resp 09/19/21 1518 18     Temp 09/19/21 1518 98.5 F (36.9 C)     Temp Source 09/19/21 1518 Oral     SpO2 09/19/21 1518 100 %     Weight 09/19/21 1519 180 lb (81.6 kg)     Height 09/19/21 1519 5\' 10"  (1.778 m)     Head Circumference --      Peak Flow --      Pain Score 09/19/21 1519 8     Pain Loc --      Pain Edu? --      Excl. in GC? --     Most recent vital signs: Vitals:   09/19/21 1518  BP: (!) 136/90  Pulse: 86  Resp: 18  Temp: 98.5 F (36.9 C)  SpO2: 100%    General: Awake, no distress.  CV:  Good peripheral perfusion.  Regular rate and rhythm  Resp:  Normal effort.  Equal breath sounds bilaterally.  Abd:  No distention.  Soft, nontender.  No rebound or guarding.  Benign abdomen.   ED Results / Procedures / Treatments   RADIOLOGY  I have reviewed and interpreted the CT images.  Patient has a somewhat distended bladder but no other concerning findings on CT imaging.  Patient's CT read as negative for acute abnormality.   MEDICATIONS ORDERED IN ED: Medications - No data to display   IMPRESSION / MDM / ASSESSMENT AND PLAN / ED COURSE  I reviewed the triage vital signs and the nursing notes.  Patient's presentation is  most consistent with acute presentation with potential threat to life or bodily function.  Patient presents emergency department for dysuria and hematuria starting this morning.  No history of either previously per patient.  No fever.  Benign abdomen on exam.  Patient's work-up so far reassuring occluding a normal chemistry, normal CBC and a reassuring CT scan.  Urinalysis is pending.  Patient's urinalysis shows greater than 50 red cells but no white cells or bacteria.  Given the patient's dysuria we will cover with antibiotics but we will also have the patient follow-up with urology this week for recheck/reevaluation.  I have also sent a urine culture.  Patient agreeable to plan of care.  FINAL CLINICAL IMPRESSION(S) / ED DIAGNOSES   Hematuria   Note:  This document was prepared using Dragon voice recognition software and may include unintentional dictation errors.   09/21/21, MD 09/19/21 2018

## 2021-09-21 LAB — URINE CULTURE: Culture: <10000 — AB

## 2021-09-29 ENCOUNTER — Other Ambulatory Visit: Payer: Self-pay

## 2021-09-29 ENCOUNTER — Emergency Department: Payer: Medicare Other

## 2021-09-29 DIAGNOSIS — R600 Localized edema: Secondary | ICD-10-CM | POA: Insufficient documentation

## 2021-09-29 DIAGNOSIS — I890 Lymphedema, not elsewhere classified: Secondary | ICD-10-CM | POA: Diagnosis not present

## 2021-09-29 DIAGNOSIS — K5900 Constipation, unspecified: Secondary | ICD-10-CM | POA: Insufficient documentation

## 2021-09-29 DIAGNOSIS — R310 Gross hematuria: Secondary | ICD-10-CM | POA: Insufficient documentation

## 2021-09-29 DIAGNOSIS — M7989 Other specified soft tissue disorders: Secondary | ICD-10-CM | POA: Diagnosis present

## 2021-09-29 LAB — COMPREHENSIVE METABOLIC PANEL
ALT: 11 U/L (ref 0–44)
AST: 18 U/L (ref 15–41)
Albumin: 3.9 g/dL (ref 3.5–5.0)
Alkaline Phosphatase: 42 U/L (ref 38–126)
Anion gap: 7 (ref 5–15)
BUN: 12 mg/dL (ref 8–23)
CO2: 28 mmol/L (ref 22–32)
Calcium: 9.2 mg/dL (ref 8.9–10.3)
Chloride: 96 mmol/L — ABNORMAL LOW (ref 98–111)
Creatinine, Ser: 1.03 mg/dL (ref 0.61–1.24)
GFR, Estimated: >60 mL/min (ref >60)
Glucose, Bld: 101 mg/dL — ABNORMAL HIGH (ref 70–99)
Potassium: 4 mmol/L (ref 3.5–5.1)
Sodium: 131 mmol/L — ABNORMAL LOW (ref 135–145)
Total Bilirubin: 0.7 mg/dL (ref 0.3–1.2)
Total Protein: 6.6 g/dL (ref 6.5–8.1)

## 2021-09-29 LAB — CBC
HCT: 39.9 % (ref 39.0–52.0)
Hemoglobin: 13.4 g/dL (ref 13.0–17.0)
MCH: 31.2 pg (ref 26.0–34.0)
MCHC: 33.6 g/dL (ref 30.0–36.0)
MCV: 92.8 fL (ref 80.0–100.0)
Platelets: 145 10*3/uL — ABNORMAL LOW (ref 150–400)
RBC: 4.3 MIL/uL (ref 4.22–5.81)
RDW: 12.9 % (ref 11.5–15.5)
WBC: 7.2 10*3/uL (ref 4.0–10.5)
nRBC: 0 % (ref 0.0–0.2)

## 2021-09-29 LAB — BRAIN NATRIURETIC PEPTIDE: B Natriuretic Peptide: 23.7 pg/mL (ref 0.0–100.0)

## 2021-09-29 NOTE — ED Triage Notes (Signed)
Pt presents via POV c/o left leg swelling. Pt reports recently dx with UTI, on PO Keflex at group home. Reports constipation at home. Had PRN Miralax per med list however has not taken per facility [facility]. Report bilateral leg pain.

## 2021-09-29 NOTE — ED Triage Notes (Signed)
First Nurse Note:  Arrives with multiple complaints... bilateral swelling to feet, recheck from recent UTI.  AAOx3.  Skin warm and dry. With daughter. NAD

## 2021-09-29 NOTE — ED Provider Triage Note (Signed)
Emergency Medicine Provider Triage Evaluation Note  Austin Rhodes, a 71 y.o. male  was evaluated in triage.  Pt complains of multiple complaints according to his adult daughter.  Patient arrives with some swelling to the bilateral feet and concern for persistent UTI.  Patient also notes some persistent constipation but no evidence of MiraLAX being provided per his MAR.  Review of Systems  Positive: BLE edema, constipation Negative: FCS  Physical Exam  BP 127/76   Pulse 76   Temp 98.4 F (36.9 C) (Oral)   Resp 14   Wt 81 kg   SpO2 100%   BMI 25.62 kg/m  Gen:   Awake, no distress  NAD Resp:  Normal effort CTA MSK:   Moves extremities without difficulty  Other:  RRR  Medical Decision Making  Medically screening exam initiated at 8:06 PM.  Appropriate orders placed.  Austin Rhodes was informed that the remainder of the evaluation will be completed by another provider, this initial triage assessment does not replace that evaluation, and the importance of remaining in the ED until their evaluation is complete.  Geriatric patient to the ED with concerns for bilateral lower extremity edema as well as some constipation.   Lissa Hoard, PA-C 09/29/21 2009

## 2021-09-30 ENCOUNTER — Emergency Department: Payer: Medicare Other

## 2021-09-30 ENCOUNTER — Emergency Department
Admission: EM | Admit: 2021-09-30 | Discharge: 2021-09-30 | Disposition: A | Discharge status: Home / Self Care | Payer: Medicare Other | Attending: Emergency Medicine | Admitting: Emergency Medicine

## 2021-09-30 ENCOUNTER — Other Ambulatory Visit: Payer: Self-pay

## 2021-09-30 ENCOUNTER — Emergency Department
Admission: EM | Admit: 2021-09-30 | Discharge: 2021-09-30 | Disposition: A | Discharge status: Home / Self Care | Payer: Medicare Other | Source: Home / Self Care | Attending: Emergency Medicine | Admitting: Emergency Medicine

## 2021-09-30 DIAGNOSIS — K5900 Constipation, unspecified: Secondary | ICD-10-CM

## 2021-09-30 DIAGNOSIS — R600 Localized edema: Secondary | ICD-10-CM | POA: Insufficient documentation

## 2021-09-30 DIAGNOSIS — R310 Gross hematuria: Secondary | ICD-10-CM | POA: Insufficient documentation

## 2021-09-30 DIAGNOSIS — I890 Lymphedema, not elsewhere classified: Secondary | ICD-10-CM

## 2021-09-30 DIAGNOSIS — M7989 Other specified soft tissue disorders: Secondary | ICD-10-CM

## 2021-09-30 LAB — URINALYSIS, ROUTINE W REFLEX MICROSCOPIC
Bacteria, UA: NONE SEEN
Bilirubin Urine: NEGATIVE
Bilirubin Urine: NEGATIVE
Glucose, UA: NEGATIVE mg/dL
Glucose, UA: NEGATIVE mg/dL
Hgb urine dipstick: NEGATIVE
Ketones, ur: NEGATIVE mg/dL
Ketones, ur: NEGATIVE mg/dL
Leukocytes,Ua: NEGATIVE
Leukocytes,Ua: NEGATIVE
Nitrite: NEGATIVE
Nitrite: NEGATIVE
Protein, ur: NEGATIVE mg/dL
Protein, ur: NEGATIVE mg/dL
RBC / HPF: >50 RBC/hpf — ABNORMAL HIGH (ref 0–5)
Specific Gravity, Urine: 1.01 (ref 1.005–1.030)
Specific Gravity, Urine: 1.009 (ref 1.005–1.030)
Squamous Epithelial / HPF: NONE SEEN (ref 0–5)
pH: 7 (ref 5.0–8.0)
pH: 7 (ref 5.0–8.0)

## 2021-09-30 MED ORDER — DOXYCYCLINE HYCLATE 100 MG PO TABS: 100.0000 mg | ORAL_TABLET | Freq: Two times a day (BID) | ORAL | 0 refills | Status: DC

## 2021-09-30 MED ORDER — CEFTRIAXONE SODIUM 1 G IJ SOLR
1.0000 g | Freq: Once | INTRAMUSCULAR | Status: AC
  Administered 2021-09-30: 1 g via INTRAMUSCULAR
  Filled 2021-09-30: qty 10

## 2021-09-30 NOTE — ED Provider Notes (Signed)
[facility] Provider Note    Event Date/Time   First MD Initiated Contact with Patient 09/30/21 0037     (approximate)   History   Leg Swelling   HPI  Austin Rhodes is a 71 y.o. male who presents to the ED for evaluation of Leg Swelling   Patient presents to the ED with his daughter for evaluation of subacute bilateral leg swelling and constipation.  Reports he had a large bowel movement this morning which improved his abdominal bloating sensation.  Reports bilateral leg swelling without trauma or injuries, left greater than right.  Reports he finished his Keflex prescription and his urinary symptoms got better.  Wants to make sure based off of urinalysis.   Physical Exam   Triage Vital Signs: ED Triage Vitals  Enc Vitals Group     BP 09/29/21 1947 127/76     Pulse Rate 09/29/21 1947 76     Resp 09/29/21 1947 14     Temp 09/29/21 1947 98.4 F (36.9 C)     Temp Source 09/29/21 1947 Oral     SpO2 09/29/21 1947 100 %     Weight 09/29/21 1948 178 lb 9.2 oz (81 kg)     Height --      Head Circumference --      Peak Flow --      Pain Score 09/29/21 1947 7     Pain Loc --      Pain Edu? --      Excl. in GC? --     Most recent vital signs: Vitals:   09/29/21 1947 09/30/21 0054  BP: 127/76 131/64  Pulse: 76 79  Resp: 14 18  Temp: 98.4 F (36.9 C) 98.2 F (36.8 C)  SpO2: 100% 99%    General: Awake, no distress.  Pleasant and conversational full sentences.  Looks systemically well. CV:  Good peripheral perfusion.  Resp:  Normal effort.  Abd:  No distention.  Soft and benign throughout MSK:  No deformity noted.  1+ pitting edema to bilateral lower extremities symmetrically without overlying skin changes or signs of trauma. Neuro:  No focal deficits appreciated. Cranial nerves II through XII intact 5/5 strength and sensation in all 4 extremities Other:     ED Results / Procedures / Treatments   Labs (all labs ordered are listed,  but only abnormal results are displayed) Labs Reviewed  COMPREHENSIVE METABOLIC PANEL - Abnormal; Notable for the following components:      Result Value   Sodium 131 (*)    Chloride 96 (*)    Glucose, Bld 101 (*)    All other components within normal limits  CBC - Abnormal; Notable for the following components:   Platelets 145 (*)    All other components within normal limits  URINALYSIS, ROUTINE W REFLEX MICROSCOPIC - Abnormal; Notable for the following components:   Color, Urine YELLOW (*)    APPearance CLEAR (*)    All other components within normal limits  BRAIN NATRIURETIC PEPTIDE    EKG   RADIOLOGY CXR interpreted by me without evidence of acute cardiopulmonary pathology. Plain film of the abdomen interpreted by me without evidence of SBO  Official radiology report(s): US Venous Img Lower Bilateral  Result Date: 09/30/2021 CLINICAL DATA:  Bilateral leg pain and swelling EXAM: BILATERAL LOWER EXTREMITY VENOUS DOPPLER ULTRASOUND TECHNIQUE: Gray-scale sonography with graded compression, as well as color Doppler and duplex ultrasound were performed to evaluate the lower extremity deep venous systems  from the level of the common femoral vein and including the common femoral, femoral, profunda femoral, popliteal and calf veins including the posterior tibial, peroneal and gastrocnemius veins when visible. The superficial great saphenous vein was also interrogated. Spectral Doppler was utilized to evaluate flow at rest and with distal augmentation maneuvers in the common femoral, femoral and popliteal veins. COMPARISON:  None Available. FINDINGS: RIGHT LOWER EXTREMITY Common Femoral Vein: No evidence of thrombus. Normal compressibility, respiratory phasicity and response to augmentation. Saphenofemoral Junction: No evidence of thrombus. Normal compressibility and flow on color Doppler imaging. Profunda Femoral Vein: No evidence of thrombus. Normal compressibility and flow on color Doppler  imaging. Femoral Vein: No evidence of thrombus. Normal compressibility, respiratory phasicity and response to augmentation. Popliteal Vein: No evidence of thrombus. Normal compressibility, respiratory phasicity and response to augmentation. Calf Veins: No evidence of thrombus. Normal compressibility and flow on color Doppler imaging. Superficial Great Saphenous Vein: No evidence of thrombus. Normal compressibility. Venous Reflux:  None. Other Findings:  None. LEFT LOWER EXTREMITY Common Femoral Vein: No evidence of thrombus. Normal compressibility, respiratory phasicity and response to augmentation. Saphenofemoral Junction: No evidence of thrombus. Normal compressibility and flow on color Doppler imaging. Profunda Femoral Vein: No evidence of thrombus. Normal compressibility and flow on color Doppler imaging. Femoral Vein: No evidence of thrombus. Normal compressibility, respiratory phasicity and response to augmentation. Popliteal Vein: No evidence of thrombus. Normal compressibility, respiratory phasicity and response to augmentation. Calf Veins: No evidence of thrombus. Normal compressibility and flow on color Doppler imaging. Superficial Great Saphenous Vein: No evidence of thrombus. Normal compressibility. Venous Reflux:  None. Other Findings:  None. IMPRESSION: No evidence of deep venous thrombosis in either lower extremity. Electronically Signed   By: Alcide Clever M.D.   On: 09/30/2021 02:21   DG Abdomen 1 View  Result Date: 09/29/2021 CLINICAL DATA:  Constipation, recent UTI EXAM: ABDOMEN - 1 VIEW COMPARISON:  CT abdomen/pelvis dated 09/19/2021 FINDINGS: Nonobstructive bowel gas pattern. Mild to moderate left colonic stool burden, raising the possibility of mild constipation. Mild degenerative changes of the lower lumbar spine. IMPRESSION: Possible mild constipation. Electronically Signed   By: Charline Bills M.D.   On: 09/29/2021 20:43   DG Chest 2 View  Result Date: 09/29/2021 CLINICAL DATA:   Left leg swelling EXAM: CHEST - 2 VIEW COMPARISON:  06/25/2021 FINDINGS: Left basilar scarring/atelectasis. Right lung is clear. No focal consolidation. No frank interstitial edema. No pleural effusion or pneumothorax. The heart is normal in size. Visualized osseous structures are within normal limits. IMPRESSION: Left basilar scarring/atelectasis. No evidence of acute cardiopulmonary disease. Electronically Signed   By: Charline Bills M.D.   On: 09/29/2021 20:42    PROCEDURES and INTERVENTIONS:  Procedures  Medications - No data to display   IMPRESSION / MDM / ASSESSMENT AND PLAN / ED COURSE  I reviewed the triage vital signs and the nursing notes.  Differential diagnosis includes, but is not limited to, constipation, SBO, cystitis, sepsis, DVT, lymphedema, hypoalbuminemia  {Patient presents with symptoms of an acute illness or injury that is potentially life-threatening.  71 year old male presents to the ED without evidence of acute pathology and suitable for outpatient management.  Looks systemically well.  He has normal vital signs and reassuring examination with a benign abdomen.  No evidence of neurologic or vascular deficits.  No signs of trauma or injury.  Negative BNP, normal CBC and urinalysis.  No signs of residual cystitis or hematuria.  Mild hyponatremia is noted on his metabolic panel.  Plain films of chest and abdomen are reassuring, as well as a DVT ultrasound without evidence of venous pathology.  No signs of arterial ischemia on examination.  No barriers to outpatient management.  We discussed return precautions.      FINAL CLINICAL IMPRESSION(S) / ED DIAGNOSES   Final diagnoses:  Constipation, unspecified constipation type  Leg swelling  Lymphedema     Rx / DC Orders   ED Discharge Orders     None        Note:  This document was prepared using Dragon voice recognition software and may include unintentional dictation errors.   Delton [place], MD 09/30/21  0400

## 2021-09-30 NOTE — ED Triage Notes (Signed)
Pt was seen earlier today for constipation and is back because he states "My penis bled twice while taking a shower."

## 2021-09-30 NOTE — Discharge Instructions (Addendum)
Please make sure Austin Rhodes is getting Miralax for his constipation. Continue all other meds.

## 2021-09-30 NOTE — ED Notes (Signed)
Patient discharged to home per MD order. Patient in stable condition, and deemed medically cleared by ED provider for discharge. Discharge instructions reviewed with patient/family using "Teach Back"; verbalized understanding of medication education and administration, and information about follow-up care. Denies further concerns. ° °

## 2021-09-30 NOTE — ED Notes (Signed)
Pt. States he was seen earlier today for constipation, states his penis was bleeding twice today.

## 2021-09-30 NOTE — ED Provider Notes (Signed)
[facility] Provider Note  Patient Contact: 5:21 PM (approximate)   History   Hematuria   HPI  Austin Rhodes is a 71 y.o. male who presents the emergency department for complaint of possible hematuria.  Patient is a somewhat limited historian, does live at a group home.  Patient states that he was here last night, diagnosed with some constipation.  He has ongoing constipation, denies any abdominal pain.  He states that he went home and had an episode where he had dysuria and hematuria.  He states that he peed frank blood.  He has peed since without hematuria.  Patient denies pain with his second urination as well.  No history of kidney stone.  Patient was here last night, received labs, imaging, diagnosed with constipation.  He was also complaining of bilateral lower extremity edema.  Patient received ultrasounds, labs, x-rays with reassuring work-up and a diagnosis of constipation.     Physical Exam   Triage Vital Signs: ED Triage Vitals  Enc Vitals Group     BP 09/30/21 1538 129/82     Pulse Rate 09/30/21 1538 75     Resp 09/30/21 1538 17     Temp 09/30/21 1538 98.9 F (37.2 C)     Temp Source 09/30/21 1538 Oral     SpO2 09/30/21 1538 100 %     Weight 09/30/21 1540 180 lb (81.6 kg)     Height 09/30/21 1540 5\' 10"  (1.778 m)     Head Circumference --      Peak Flow --      Pain Score 09/30/21 1540 2     Pain Loc --      Pain Edu? --      Excl. in GC? --     Most recent vital signs: Vitals:   09/30/21 1538  BP: 129/82  Pulse: 75  Resp: 17  Temp: 98.9 F (37.2 C)  SpO2: 100%     General: Alert and in no acute distress.   Cardiovascular:  Good peripheral perfusion Respiratory: Normal respiratory effort without tachypnea or retractions. Lungs CTAB.  Gastrointestinal: Bowel sounds 4 quadrants. Soft and nontender to palpation. No guarding or rigidity. No palpable masses. No distention. No CVA tenderness. Urogenital: Evaluation of  the penis reveals no visible signs of external trauma.  Patient does have some mild blood in the meatus.  There is no ongoing hemorrhage.  No tenderness along the penile shaft.  No tenderness over the testicles. Musculoskeletal: Full range of motion to all extremities.  Neurologic:  No gross focal neurologic deficits are appreciated.  Skin:   No rash noted Other:   ED Results / Procedures / Treatments   Labs (all labs ordered are listed, but only abnormal results are displayed) Labs Reviewed  URINALYSIS, ROUTINE W REFLEX MICROSCOPIC - Abnormal; Notable for the following components:      Result Value   Color, Urine YELLOW (*)    APPearance CLEAR (*)    Hgb urine dipstick LARGE (*)    RBC / HPF >50 (*)    All other components within normal limits     EKG     RADIOLOGY  I personally viewed, evaluated, and interpreted these images as part of my medical decision making, as well as reviewing the written report by the radiologist.  ED Provider Interpretation: No acute findings on CT abdomen pelvis, specifically no findings to explain hematuria  CT Renal Stone Study  Result Date: 09/30/2021 CLINICAL DATA:  Flank  pain.  Suspected hematuria. EXAM: CT ABDOMEN AND PELVIS WITHOUT CONTRAST TECHNIQUE: Multidetector CT imaging of the abdomen and pelvis was performed following the standard protocol without IV contrast. RADIATION DOSE REDUCTION: This exam was performed according to the departmental dose-optimization program which includes automated exposure control, adjustment of the mA and/or kV according to patient size and/or use of iterative reconstruction technique. COMPARISON:  CT renal stone 09/19/2021 FINDINGS: Lower chest: No acute abnormality. Hepatobiliary: There is an 11 mm cyst in the left lobe of the liver. Gallbladder and bile ducts are within normal limits. Pancreas: Unremarkable. No pancreatic ductal dilatation or surrounding inflammatory changes. Spleen: Normal in size without  focal abnormality. Adrenals/Urinary Tract: Adrenal glands are unremarkable. Kidneys are normal, without renal calculi, focal lesion, or hydronephrosis. Bladder is unremarkable. Stomach/Bowel: Appendix appears normal. No evidence of bowel wall thickening, distention, or inflammatory changes. There is a large amount of stool throughout the colon. There is a small hiatal hernia. Stomach is otherwise within normal limits. Vascular/Lymphatic: Aortic atherosclerosis. No enlarged abdominal or pelvic lymph nodes. Reproductive: Prostate is unremarkable. Other: No abdominal wall hernia or abnormality. No abdominopelvic ascites. Musculoskeletal: Degenerative changes affect the spine. IMPRESSION: 1. No acute process in the abdomen or pelvis. 2. Stable small hepatic cyst.  No follow-up necessary. 3. Small hiatal hernia. 4.  Aortic Atherosclerosis (ICD10-I70.0). Electronically Signed   By: Darliss Cheney M.D.   On: 09/30/2021 18:18   PROCEDURES:  Critical Care performed: No  Procedures   MEDICATIONS ORDERED IN ED: Medications  cefTRIAXone (ROCEPHIN) injection 1 g (has no administration in time range)     IMPRESSION / MDM / ASSESSMENT AND PLAN / ED COURSE  I reviewed the triage vital signs and the nursing notes.                              Differential diagnosis includes, but is not limited to, hematuria, penile trauma, urethritis, UTI, pyelonephritis, nephrolithiasis  Patient's presentation is most consistent with acute presentation with potential threat to life or bodily function.   Patient's diagnosis is consistent with hematuria.  Patient presented to the ED with reports of bleeding from the penis.  Patient lives in a group home, is a somewhat poor historian.  Patient was seen earlier today for complaints of abdominal pain and bilateral lower extremity edema.  Patient was noted to have constipation and the remainder of the work-up was reassuring.  Patient states that tonight he urinated, had pain with  urination and noticed some blood.  Then patient states that he urinated without any hematuria.  He did have hematuria here in the emergency department but denies any pain.  There was a small amount of blood in the meatus.  At this time, urinalysis reveals hematuria without signs of infection.  CT scan of the abdomen pelvis revealed no findings to explain patient's symptoms.  At this time differential still includes local trauma to the urethra, recently passed kidney stone, urethritis.  We will treat with antibiotics to ensure no evidence of urethritis.  Recommend follow-up with urology if symptoms of hematuria persist..  Patient is given ED precautions to return to the ED for any worsening or new symptoms.        FINAL CLINICAL IMPRESSION(S) / ED DIAGNOSES   Final diagnoses:  Gross hematuria     Rx / DC Orders   ED Discharge Orders          Ordered    doxycycline (  VIBRA-TABS) 100 MG tablet  2 times daily        09/30/21 2004             Note:  This document was prepared using Dragon voice recognition software and may include unintentional dictation errors.   Lanette Hampshire 09/30/21 2006    Merwyn Katos, MD 09/30/21 [phone]

## 2021-09-30 NOTE — ED Notes (Signed)
Notified provider JC that daughter at bedside would like prescriptions sent to [facility].

## 2021-09-30 NOTE — ED Notes (Signed)
Pt taken back to [facility] by daughter. Pt and daughter educated and given d/c paperwork. Prescriptions sent to [facility] as requested by pt's daughter as states this is only pharm facility will pick up from.

## 2021-09-30 NOTE — ED Notes (Signed)
This RN to bedside to assist pt. To stand and urinate. Pt. Had blood dripping from penis, and blood in his brief. Pt. Was able to begin stream, large clot was dislodged from penis.

## 2021-10-02 ENCOUNTER — Encounter: Payer: Self-pay | Admitting: Urology

## 2021-10-02 ENCOUNTER — Ambulatory Visit (INDEPENDENT_AMBULATORY_CARE_PROVIDER_SITE_OTHER): Payer: Medicare Other | Admitting: Urology

## 2021-10-02 VITALS — BP 119/80 | HR 109 | Ht 70.0 in | Wt 177.0 lb

## 2021-10-02 DIAGNOSIS — R300 Dysuria: Secondary | ICD-10-CM | POA: Diagnosis not present

## 2021-10-02 DIAGNOSIS — R310 Gross hematuria: Secondary | ICD-10-CM | POA: Diagnosis not present

## 2021-10-02 NOTE — Patient Instructions (Signed)
We will see you back in 1 month for a repeat urine test, if no microscopic blood is seen at that time we can defer any further work-up, but if still have microscopic blood will perform cystoscopy at that visit to rule out any other abnormalities in the bladder.  Cystoscopy Cystoscopy is a procedure that is used to help diagnose and sometimes treat conditions that affect the lower urinary tract. The lower urinary tract includes the bladder and the urethra. The urethra is the tube that drains urine from the bladder. Cystoscopy is done using a thin, tube-shaped instrument with a light and camera at the end (cystoscope). The cystoscope may be hard or flexible, depending on the goal of the procedure. The cystoscope is inserted through the urethra, into the bladder. Cystoscopy may be recommended if you have: Urinary tract infections that keep coming back. Blood in the urine (hematuria). An inability to control when you urinate (urinary incontinence) or an overactive bladder. Unusual cells found in a urine sample. A blockage in the urethra, such as a urinary stone. Painful urination. An abnormality in the bladder found during an intravenous pyelogram (IVP) or CT scan. What are the risks? Generally, this is a safe procedure. However, problems may occur, including: Infection. Bleeding.  What happens during the procedure?  You will be given one or more of the following: A medicine to numb the area (local anesthetic). The area around the opening of your urethra will be cleaned. The cystoscope will be passed through your urethra into your bladder. Germ-free (sterile) fluid will flow through the cystoscope to fill your bladder. The fluid will stretch your bladder so that your health care provider can clearly examine your bladder walls. Your doctor will look at the urethra and bladder. The cystoscope will be removed The procedure may vary among health care providers  What can I expect after the  procedure? After the procedure, it is common to have: Some soreness or pain in your urethra. Urinary symptoms. These include: Mild pain or burning when you urinate. Pain should stop within a few minutes after you urinate. This may last for up to a few days after the procedure. A small amount of blood in your urine for several days. Feeling like you need to urinate but producing only a small amount of urine. Follow these instructions at home: General instructions Return to your normal activities as told by your health care provider.  Drink plenty of fluids after the procedure. Keep all follow-up visits as told by your health care provider. This is important. Contact a health care provider if you: Have pain that gets worse or does not get better with medicine, especially pain when you urinate lasting longer than 72 hours after the procedure. Have trouble urinating. Get help right away if you: Have blood clots in your urine. Have a fever or chills. Are unable to urinate. Summary Cystoscopy is a procedure that is used to help diagnose and sometimes treat conditions that affect the lower urinary tract. Cystoscopy is done using a thin, tube-shaped instrument with a light and camera at the end. After the procedure, it is common to have some soreness or pain in your urethra. It is normal to have blood in your urine after the procedure.  If you were prescribed an antibiotic medicine, take it as told by your health care provider.  This information is not intended to replace advice given to you by your health care provider. Make sure you discuss any questions you have with  your health care provider. Document Revised: 01/11/2018 Document Reviewed: 01/11/2018 Elsevier Patient Education  2020 [employer].

## 2021-10-02 NOTE — Progress Notes (Signed)
   10/02/21 11:04 AM   Austin Rhodes [birth year] [id number]  CC: Gross hematuria, possible UTI  HPI: 71 year old male with bipolar disorder who resides in a group home and has had multiple ER visits over the last month for hematuria and dysuria.  He has had multiple CT scans, all of which were benign.  Urinalysis in the ED showed microscopic hematuria as well as some pyuria, but culture was negative.  He was ultimately treated with doxycycline, which is completely resolved his symptoms, and he denies any further gross hematuria.  He is a never smoker.  He was unable to void for urinalysis today.  On anticoagulation with Plavix for history of CAD.   PMH: Past Medical History:  Diagnosis Date   Bipolar disorder (HCC)    Coronary artery disease    GERD (gastroesophageal reflux disease)    History of multiple strokes    PTSD (post-traumatic stress disorder)     Surgical History: Past Surgical History:  Procedure Laterality Date   CARDIAC CATHETERIZATION      Social History:  reports that he has never smoked. He has never been exposed to tobacco smoke. He has never used smokeless tobacco. He reports that he does not drink alcohol and does not use drugs.  Physical Exam: BP 119/80   Pulse (!) 109   Ht 5\' 10"  (1.778 m)   Wt 177 lb (80.3 kg)   BMI 25.40 kg/m    Constitutional:  Alert and oriented, No acute distress. Cardiovascular: No clubbing, cyanosis, or edema. Respiratory: Normal respiratory effort, no increased work of breathing. GI: Abdomen is soft, nontender, nondistended, no abdominal masses  Laboratory Data: Reviewed, see HPI  Pertinent Imaging: I have personally viewed and interpreted the multiple CT scans with and without contrast showing no evidence of hydronephrosis, stones, or bladder lesion.  Assessment & Plan:   71 year old male with hematuria and dysuria, potentially related to infection as urinalysis does show pyuria and his symptoms completely  resolved with antibiotics.  We discussed common possible etiologies of hematuria including infection, BPH, malignancy, urolithiasis, medical renal disease, and idiopathic. Standard workup recommended by the AUA includes imaging with CT urogram to assess the upper tracts, and cystoscopy. Cytology is performed on patient's with gross hematuria to look for malignant cells in the urine.  Using shared decision making, he opted to defer cystoscopy at this time with multiple negative imaging and improvement on antibiotics.  It he was amenable to follow-up in 1 month with repeat urinalysis, and willing to undergo cystoscopy at that time if any evidence of microscopic hematuria   80, MD 10/02/2021  [facility] [address], [address] [place], [place] [phone]

## 2021-10-19 ENCOUNTER — Emergency Department
Admission: EM | Admit: 2021-10-19 | Discharge: 2021-10-19 | Disposition: A | Discharge status: Home / Self Care | Payer: Medicare Other | Attending: Emergency Medicine | Admitting: Emergency Medicine

## 2021-10-19 ENCOUNTER — Emergency Department: Payer: Medicare Other

## 2021-10-19 ENCOUNTER — Other Ambulatory Visit: Payer: Self-pay

## 2021-10-19 DIAGNOSIS — K5900 Constipation, unspecified: Secondary | ICD-10-CM | POA: Insufficient documentation

## 2021-10-19 DIAGNOSIS — M7989 Other specified soft tissue disorders: Secondary | ICD-10-CM | POA: Insufficient documentation

## 2021-10-19 DIAGNOSIS — R1031 Right lower quadrant pain: Secondary | ICD-10-CM | POA: Diagnosis present

## 2021-10-19 DIAGNOSIS — I6782 Cerebral ischemia: Secondary | ICD-10-CM | POA: Insufficient documentation

## 2021-10-19 DIAGNOSIS — R7989 Other specified abnormal findings of blood chemistry: Secondary | ICD-10-CM | POA: Insufficient documentation

## 2021-10-19 DIAGNOSIS — S0990XA Unspecified injury of head, initial encounter: Secondary | ICD-10-CM | POA: Diagnosis not present

## 2021-10-19 DIAGNOSIS — R778 Other specified abnormalities of plasma proteins: Secondary | ICD-10-CM | POA: Insufficient documentation

## 2021-10-19 DIAGNOSIS — R300 Dysuria: Secondary | ICD-10-CM | POA: Insufficient documentation

## 2021-10-19 DIAGNOSIS — R0602 Shortness of breath: Secondary | ICD-10-CM | POA: Diagnosis not present

## 2021-10-19 DIAGNOSIS — Z20822 Contact with and (suspected) exposure to covid-19: Secondary | ICD-10-CM | POA: Diagnosis not present

## 2021-10-19 LAB — LIPASE, BLOOD: Lipase: 35 U/L (ref 11–51)

## 2021-10-19 LAB — COMPREHENSIVE METABOLIC PANEL
ALT: 10 U/L (ref 0–44)
AST: 25 U/L (ref 15–41)
Albumin: 4 g/dL (ref 3.5–5.0)
Alkaline Phosphatase: 47 U/L (ref 38–126)
Anion gap: 12 (ref 5–15)
BUN: 19 mg/dL (ref 8–23)
CO2: 22 mmol/L (ref 22–32)
Calcium: 9.5 mg/dL (ref 8.9–10.3)
Chloride: 103 mmol/L (ref 98–111)
Creatinine, Ser: 1.07 mg/dL (ref 0.61–1.24)
GFR, Estimated: >60 mL/min (ref >60)
Glucose, Bld: 104 mg/dL — ABNORMAL HIGH (ref 70–99)
Potassium: 4.3 mmol/L (ref 3.5–5.1)
Sodium: 137 mmol/L (ref 135–145)
Total Bilirubin: 0.6 mg/dL (ref 0.3–1.2)
Total Protein: 6.8 g/dL (ref 6.5–8.1)

## 2021-10-19 LAB — URINALYSIS, ROUTINE W REFLEX MICROSCOPIC
Bilirubin Urine: NEGATIVE
Glucose, UA: NEGATIVE mg/dL
Hgb urine dipstick: NEGATIVE
Ketones, ur: 5 mg/dL — AB
Leukocytes,Ua: NEGATIVE
Nitrite: NEGATIVE
Protein, ur: NEGATIVE mg/dL
Specific Gravity, Urine: 1.036 — ABNORMAL HIGH (ref 1.005–1.030)
pH: 7 (ref 5.0–8.0)

## 2021-10-19 LAB — CBC
HCT: 44.8 % (ref 39.0–52.0)
Hemoglobin: 14.5 g/dL (ref 13.0–17.0)
MCH: 30.5 pg (ref 26.0–34.0)
MCHC: 32.4 g/dL (ref 30.0–36.0)
MCV: 94.3 fL (ref 80.0–100.0)
Platelets: 151 10*3/uL (ref 150–400)
RBC: 4.75 MIL/uL (ref 4.22–5.81)
RDW: 12.8 % (ref 11.5–15.5)
WBC: 4.9 10*3/uL (ref 4.0–10.5)
nRBC: 0 % (ref 0.0–0.2)

## 2021-10-19 LAB — TROPONIN I (HIGH SENSITIVITY)
Troponin I (High Sensitivity): 64 ng/L — ABNORMAL HIGH (ref <18)
Troponin I (High Sensitivity): 61 ng/L — ABNORMAL HIGH (ref <18)

## 2021-10-19 LAB — BRAIN NATRIURETIC PEPTIDE: B Natriuretic Peptide: 169.2 pg/mL — ABNORMAL HIGH (ref 0.0–100.0)

## 2021-10-19 LAB — SARS CORONAVIRUS 2 BY RT PCR: SARS Coronavirus 2 by RT PCR: NEGATIVE

## 2021-10-19 MED ORDER — ONDANSETRON 4 MG PO TBDP: 4.0000 mg | ORAL_TABLET | Freq: Once | ORAL | Status: DC | PRN

## 2021-10-19 MED ORDER — POLYETHYLENE GLYCOL 3350 17 G PO PACK
17.0000 g | PACK | Freq: Once | ORAL | Status: AC
  Administered 2021-10-19: 17 g via ORAL
  Filled 2021-10-19: qty 1

## 2021-10-19 MED ORDER — IOHEXOL 350 MG/ML SOLN
100.0000 mL | Freq: Once | INTRAVENOUS | Status: AC | PRN
  Administered 2021-10-19: 100 mL via INTRAVENOUS

## 2021-10-19 NOTE — ED Notes (Signed)
Patient is sitting in a wheelchair in the lobby. NAD.

## 2021-10-19 NOTE — Discharge Instructions (Addendum)
  Please call his cardiologist to make a follow-up appointment due to the CT scan and his slightly elevated troponin but given it was downtrending and no active chest pain we have elected to have him follow-up outpatient.  But if he develops chest pain or severe shortness of breath then please return to the ER for repeat evaluation.  He may need an echocardiogram given his leg swelling to make sure that there is no signs of any heart failure as well.  He had no signs of any edema in his lungs which is a good sign  His CT scans of his head was negative and his urine with that was without UTI.  I am not sure what was causing him to be more confused earlier today but he seems to be doing well now.  If things are changing he can always have him return to the ER for repeat evaluation  IMPRESSION: 1. No pulmonary embolus. 2. Small hiatal hernia. 3. Stool throughout the colon consistent with history of constipation. No bowel obstruction or perforation. No stercoral colitis. 4. Aortic Atherosclerosis (ICD10-I70.0) including four-vessel coronary calcification.

## 2021-10-19 NOTE — ED Notes (Signed)
Pt given snacks per request. Denies ability to urinate at this time.

## 2021-10-19 NOTE — ED Notes (Signed)
Patient transported to x-ray. ?

## 2021-10-19 NOTE — ED Triage Notes (Signed)
Per EMS report, patient comes from [facility] and staff states patient hasn't had a bowel movement for two weeks and abdomen is distended. Patient has a history of AMS.   EMS VS  130/80 96 pulse 16RR 95%room air

## 2021-10-19 NOTE — ED Triage Notes (Signed)
Pt via EMS from [facility] reporting UTI and constipation with no BM x 2 weeks. Pt reports burning urination, bladder pain, and abdominal pain. Pt states he has problems with his memory and is not sure if he has other complaints at this time.

## 2021-10-19 NOTE — ED Provider Triage Note (Signed)
Emergency Medicine Provider Triage Evaluation Note  Austin Rhodes , a 71 y.o. male  was evaluated in triage.  Pt complains of dysuria and constipation. Constipation x 2 weeks with abdominal pain. No fever he can recall.  Physical Exam  There were no vitals taken for this visit. Gen:   Awake, no distress   Resp:  Normal effort  MSK:   Moves extremities without difficulty  Other:    Medical Decision Making  Medically screening exam initiated at 3:47 PM.  Appropriate orders placed.  Austin Rhodes was informed that the remainder of the evaluation will be completed by another provider, this initial triage assessment does not replace that evaluation, and the importance of remaining in the ED until their evaluation is complete.    Victorino Dike, FNP 10/19/21 1548

## 2021-10-19 NOTE — ED Provider Notes (Signed)
[facility] Provider Note    Event Date/Time   First MD Initiated Contact with Patient 10/19/21 1714     (approximate)   History   Urinary Tract Infection   HPI  Austin Rhodes is a 71 y.o. male with history of memory issues who comes in from [facility] concern for no bowel movement for 2 weeks as well as some abdominal pain bladder pain and burning with urination.  When I went in the room the patient's chief complaint to me was left leg swelling and redness.  He denies ever having this previously.  He reports some increasing shortness of breath that started today as well.  When asked him about the constipation he reports not having a bowel movement for 2 weeks and states that he does have some right lower quadrant pain.  He denies ever having pain like this before.  He does report history of memory issues but does state that this stuff is all new for him. Physical Exam   Triage Vital Signs: ED Triage Vitals [10/19/21 1548]  Enc Vitals Group     BP 132/78     Pulse Rate 86     Resp 16     Temp 97.9 F (36.6 C)     Temp Source Oral     SpO2 100 %     Weight 177 lb (80.3 kg)     Height 5\' 10"  (1.778 m)     Head Circumference      Peak Flow      Pain Score 5     Pain Loc      Pain Edu?      Excl. in [place]?     Most recent vital signs: Vitals:   10/19/21 1548  BP: 132/78  Pulse: 86  Resp: 16  Temp: 97.9 F (36.6 C)  SpO2: 100%     General: Awake, no distress.  CV:  Good peripheral perfusion.  Resp:  Normal effort.  Abd:  No distention.  He has some tenderness in his lower abdomen. Other:  Patient has swelling noted in the left leg with a little bit of redness noted.  He is got good distal pulses bilaterally.   ED Results / Procedures / Treatments   Labs (all labs ordered are listed, but only abnormal results are displayed) Labs Reviewed  COMPREHENSIVE METABOLIC PANEL - Abnormal; Notable for the following components:       Result Value   Glucose, Bld 104 (*)    All other components within normal limits  LIPASE, BLOOD  CBC  URINALYSIS, ROUTINE W REFLEX MICROSCOPIC     EKG  My interpretation of EKG:  Normal sinus rhythm 71 without any ST elevation or T wave inversions, normal intervals  RADIOLOGY I have reviewed the xray personally and interpreted no evidence of obstruction looks consistent with more constipation    PROCEDURES:  Critical Care performed: No  .1-3 Lead EKG Interpretation  Performed by: Vanessa [place], MD Authorized by: Vanessa Neabsco, MD     Interpretation: normal     ECG rate:  80   ECG rate assessment: normal     Rhythm: sinus rhythm     Ectopy: none     Conduction: normal      MEDICATIONS ORDERED IN ED: Medications  ondansetron (ZOFRAN-ODT) disintegrating tablet 4 mg (has no administration in time range)     IMPRESSION / MDM / ASSESSMENT AND PLAN / ED COURSE  I reviewed  the triage vital signs and the nursing notes.   Patient's presentation is most consistent with acute presentation with potential threat to life or bodily function.  Differential includes acute abdominal pathology, PE, DVT, ACS given the concern for shortness of breath we will proceed with cardiac work-up and CT imaging  CBC reassuring.  Lipase normal.  CMP reassuring.  X-ray consistent with constipation with large colonic stool burden.  BNP is slightly elevated  CT imaging does not show any PE but does show evidence of constipation  Ultrasounds are negative for DVT.    10:19 PM I talked to patient's daughter who is his POA.  We discussed the cardiac stuff and given no active chest pain and or shortness of breath they would prefer to follow-up with her own cardiologist that they have in [place] where she currently works.  They understand that if he develops chest pain or shortness of breath to return to the ER that he is got coronary disease noted on CT scan but given downtrending troponin  that would be reasonable to follow-up outpatient and they would prefer to do that.  They stated that they were really bringing him in because of the altered mental status.  This is not told to [place] by EMS.  She reports the facility told her that he was not acting his normal self and he was putting 2 diapers on his head and has had some recurrent falls.  Will get CT head, CT cervical.   UA without evidence of   CT imaging is negative.  I called patient's daughter back and she is going to come pick him up and take him back to the facility and make sure that he is acting around his baseline self.  Considered admission given elevated troponin but given downtrending in the setting of no active chest pain patient can follow-up outpatient after discussion decision with patient's POA.  The patient is on the cardiac monitor to evaluate for evidence of arrhythmia and/or significant heart rate changes.      FINAL CLINICAL IMPRESSION(S) / ED DIAGNOSES   Final diagnoses:  Constipation, unspecified constipation type  Troponin level elevated     Rx / DC Orders   ED Discharge Orders     None        Note:  This document was prepared using Dragon voice recognition software and may include unintentional dictation errors.   Vanessa [place], MD 10/19/21 2312

## 2021-10-21 LAB — URINE CULTURE: Culture: NO GROWTH

## 2021-10-30 ENCOUNTER — Ambulatory Visit (INDEPENDENT_AMBULATORY_CARE_PROVIDER_SITE_OTHER): Payer: Medicare Other | Admitting: Urology

## 2021-10-30 ENCOUNTER — Encounter: Payer: Self-pay | Admitting: Urology

## 2021-10-30 VITALS — BP 123/86 | HR 108 | Ht 70.0 in | Wt 177.0 lb

## 2021-10-30 DIAGNOSIS — N390 Urinary tract infection, site not specified: Secondary | ICD-10-CM

## 2021-10-30 DIAGNOSIS — R300 Dysuria: Secondary | ICD-10-CM | POA: Diagnosis not present

## 2021-10-30 DIAGNOSIS — R310 Gross hematuria: Secondary | ICD-10-CM

## 2021-10-30 NOTE — Progress Notes (Signed)
   10/30/2021 10:37 AM   Dorna Bloom Faust 05-30-1950 [id number]  Reason for visit: Follow up urinary symptoms, possible hematuria  HPI: 71 year old male with bipolar disorder who resides in a group home and has had multiple visits in the ER for possible hematuria dysuria.  He has had multiple CT scans all of which have been benign.  Cultures were negative, but he was ultimately treated with doxycycline which completely resolved his urinary symptoms and hematuria.  At our visit on 10/02/2021 we opted for a 1 month follow-up for repeat urinalysis, and if this was benign defer cystoscopy for further evaluation.  I reviewed the notes from his recent ER visit for 10/19/2021 for confusion.  Urinalysis at that time was benign, urine culture negative, and I personally viewed and interpreted the CT abdomen and pelvis with contrast showing no urologic abnormalities.  Constipation was present.  Using shared decision making opted to defer cystoscopy today.  We discussed the risks of potentially missing additional pathology, however that the procedure can be uncomfortable and there is a small risk of UTI.  He is in agreement to defer cystoscopy.    Recommend follow-up in 1 year with UA and PVR, consider cystoscopy in the future if recurrent gross hematuria in the absence of infection   Billey Co, MD  [place] [address], [place], [place] [address] [phone]

## 2021-11-03 ENCOUNTER — Ambulatory Visit: Payer: Medicare Other | Admitting: Podiatry

## 2021-11-04 ENCOUNTER — Ambulatory Visit: Payer: Medicare Other | Admitting: Podiatry

## 2021-11-13 ENCOUNTER — Ambulatory Visit (INDEPENDENT_AMBULATORY_CARE_PROVIDER_SITE_OTHER): Payer: Medicare Other | Admitting: Podiatry

## 2021-11-13 DIAGNOSIS — R601 Generalized edema: Secondary | ICD-10-CM

## 2021-11-13 NOTE — Progress Notes (Signed)
Subjective:  Patient ID: Austin Rhodes, male    DOB: 1951-01-26,  MRN: [id number]  Chief Complaint  Patient presents with   Foot Pain    71 y.o. male presents with the above complaint.  Patient presents with complaint of generalized swelling left greater than right side.  Patient states it started few weeks ago is progressive gotten worse he denies any calf pain no pain in the back of the leg.  He noticed more more swelling in the ankle.  He denies any other acute issues would like to discuss treatment options for it.  He tries to elevate but is mostly in the dependent position   Review of Systems: Negative except as noted in the HPI. Denies N/V/F/Ch.  Past Medical History:  Diagnosis Date   Bipolar disorder ([place])    Coronary artery disease    GERD (gastroesophageal reflux disease)    History of multiple strokes    PTSD (post-traumatic stress disorder)     Current Outpatient Medications:    acetaminophen (TYLENOL) 325 MG tablet, Take by mouth., Disp: , Rfl:    benzonatate (TESSALON) 200 MG capsule, Take 1 capsule (200 mg total) by mouth 3 (three) times daily as needed for cough., Disp: 30 capsule, Rfl: 0   Cholecalciferol (VITAMIN D3) 125 MCG (5000 UT) TABS, 1 tablet WEEKLY (route: oral), Disp: , Rfl:    clopidogrel (PLAVIX) 75 MG tablet, Take 1 tablet (75 mg total) by mouth daily., Disp: 30 tablet, Rfl: 3   divalproex (DEPAKOTE ER) 500 MG 24 hr tablet, Take 2 tablets (1,000 mg total) by mouth at bedtime., Disp: 30 tablet, Rfl: 3   docusate sodium (COLACE) 100 MG capsule, Take 1 capsule (100 mg total) by mouth 2 (two) times daily., Disp: 60 capsule, Rfl: 2   fluPHENAZine (PROLIXIN) 2.5 MG tablet, Take by mouth., Disp: , Rfl:    fluticasone (FLONASE) 50 MCG/ACT nasal spray, Place 2 sprays into both nostrils daily., Disp: 16 g, Rfl: 0   guaiFENesin (MUCINEX) 600 MG 12 hr tablet, Take 1 tablet (600 mg total) by mouth 2 (two) times daily., Disp: 20 tablet, Rfl: 0   lamoTRIgine  (LAMICTAL) 25 MG tablet, Take 2 tablets (50 mg total) by mouth 2 (two) times daily., Disp: 60 tablet, Rfl: 3   losartan (COZAAR) 100 MG tablet, Take 1 tablet (100 mg total) by mouth daily., Disp: 30 tablet, Rfl: 3   metFORMIN (GLUCOPHAGE) 500 MG tablet, Take 500 mg by mouth 2 (two) times daily., Disp: , Rfl:    metoprolol succinate (TOPROL-XL) 25 MG 24 hr tablet, Take 25 mg by mouth daily., Disp: , Rfl:    polyethylene glycol powder (GLYCOLAX/MIRALAX) 17 GM/SCOOP powder, Take 17 g by mouth 3 (three) times daily as needed for moderate constipation., Disp: 255 g, Rfl: 0   QUEtiapine (SEROQUEL) 400 MG tablet, Take 1 tablet (400 mg total) by mouth at bedtime., Disp: 30 tablet, Rfl: 3   risperiDONE (RISPERDAL) 1 MG tablet, Take 1 mg by mouth at bedtime., Disp: , Rfl:    rosuvastatin (CRESTOR) 10 MG tablet, Take 1 tablet (10 mg total) by mouth daily., Disp: 30 tablet, Rfl: 3  Social History   Tobacco Use  Smoking Status Never   Passive exposure: Never  Smokeless Tobacco Never    Allergies  Allergen Reactions   Procaine Other (See Comments)   Objective:  There were no vitals filed for this visit. There is no height or weight on file to calculate BMI. Constitutional Well developed. Well  nourished.  Vascular Dorsalis pedis pulses palpable bilaterally. Posterior tibial pulses palpable bilaterally. Capillary refill normal to all digits.  No cyanosis or clubbing noted. Pedal hair growth normal.  Neurologic Normal speech. Oriented to person, place, and time. Epicritic sensation to light touch grossly present bilaterally.  Dermatologic General lysed swelling noted without any open ulcers or venous stasis ulcers noted.  2+ pitting edema left greater than right side.  Orthopedic: Normal joint ROM without pain or crepitus bilaterally. No visible deformities. No bony tenderness.   Radiographs: None Assessment:   1. Generalized edema    Plan:  Patient was evaluated and treated and all  questions answered.  Bilateral generalized edema -Questions and concerns were discussed with the patient in extensive detail.  Given the amount of swelling that is present he will benefit from aggressive elevation I discussed the importance of elevation as well as compression socks.  He states understanding will work on that.   No follow-ups on file.

## 2021-11-20 ENCOUNTER — Emergency Department: Payer: Medicare Other

## 2021-11-20 ENCOUNTER — Encounter: Payer: Self-pay | Admitting: Intensive Care

## 2021-11-20 ENCOUNTER — Other Ambulatory Visit: Payer: Self-pay

## 2021-11-20 ENCOUNTER — Emergency Department
Admission: EM | Admit: 2021-11-20 | Discharge: 2021-11-20 | Disposition: A | Discharge status: Home / Self Care | Payer: Medicare Other | Attending: Emergency Medicine | Admitting: Emergency Medicine

## 2021-11-20 DIAGNOSIS — R531 Weakness: Secondary | ICD-10-CM | POA: Diagnosis present

## 2021-11-20 DIAGNOSIS — Z1152 Encounter for screening for COVID-19: Secondary | ICD-10-CM | POA: Insufficient documentation

## 2021-11-20 LAB — URINALYSIS, ROUTINE W REFLEX MICROSCOPIC
Bilirubin Urine: NEGATIVE
Glucose, UA: NEGATIVE mg/dL
Hgb urine dipstick: NEGATIVE
Ketones, ur: 20 mg/dL — AB
Leukocytes,Ua: NEGATIVE
Nitrite: NEGATIVE
Protein, ur: NEGATIVE mg/dL
Specific Gravity, Urine: 1.017 (ref 1.005–1.030)
pH: 6 (ref 5.0–8.0)

## 2021-11-20 LAB — CBC
HCT: 41.7 % (ref 39.0–52.0)
Hemoglobin: 13.6 g/dL (ref 13.0–17.0)
MCH: 30.5 pg (ref 26.0–34.0)
MCHC: 32.6 g/dL (ref 30.0–36.0)
MCV: 93.5 fL (ref 80.0–100.0)
Platelets: 140 10*3/uL — ABNORMAL LOW (ref 150–400)
RBC: 4.46 MIL/uL (ref 4.22–5.81)
RDW: 12.6 % (ref 11.5–15.5)
WBC: 7.6 10*3/uL (ref 4.0–10.5)
nRBC: 0 % (ref 0.0–0.2)

## 2021-11-20 LAB — BASIC METABOLIC PANEL
Anion gap: 8 (ref 5–15)
BUN: 19 mg/dL (ref 8–23)
CO2: 24 mmol/L (ref 22–32)
Calcium: 9.1 mg/dL (ref 8.9–10.3)
Chloride: 104 mmol/L (ref 98–111)
Creatinine, Ser: 1.08 mg/dL (ref 0.61–1.24)
GFR, Estimated: >60 mL/min (ref >60)
Glucose, Bld: 97 mg/dL (ref 70–99)
Potassium: 4.1 mmol/L (ref 3.5–5.1)
Sodium: 136 mmol/L (ref 135–145)

## 2021-11-20 LAB — TROPONIN I (HIGH SENSITIVITY)
Troponin I (High Sensitivity): 5 ng/L (ref <18)
Troponin I (High Sensitivity): 6 ng/L (ref <18)

## 2021-11-20 LAB — SARS CORONAVIRUS 2 BY RT PCR: SARS Coronavirus 2 by RT PCR: NEGATIVE

## 2021-11-20 MED ORDER — SODIUM CHLORIDE 0.9 % IV BOLUS
500.0000 mL | Freq: Once | INTRAVENOUS | Status: AC
  Administered 2021-11-20: 500 mL via INTRAVENOUS

## 2021-11-20 NOTE — ED Notes (Signed)
This RN made an attempt to contact Austin Rhodes without success. Family at the bedside and provided an update on D/C information and plan of care.

## 2021-11-20 NOTE — ED Notes (Signed)
Pts daughter Gregary Signs [phone]) provided an update.

## 2021-11-20 NOTE — ED Notes (Signed)
Patient bladder scanned as he states he is unable to void. 744mL found after bladder scan.

## 2021-11-20 NOTE — ED Provider Notes (Signed)
[facility] Provider Note    Event Date/Time   First MD Initiated Contact with Patient 11/20/21 1557     (approximate)  History   Chief Complaint: Weakness  HPI  Austin Rhodes is a 71 y.o. male with a past medical history of bipolar, gastric reflux, presents to the emergency department for weakness.  According to the patient he called EMS today because he was feeling weak and has had multiple falls recently.  EMS states they went to the patient's group home to pick him up and could not find any staff present at the group home and they transported the patient to the emergency department for evaluation.  Here the patient appears well he is able to give a good history, states that he has had several falls over the last couple days does not believe he has hit his head and denies LOC.  Patient states generalized weakness but denies any focal weakness.  Patient states at baseline he uses a walker, but is not clear if the patient was using his walker when he had his falls.  Physical Exam   Triage Vital Signs: ED Triage Vitals [11/20/21 1030]  Enc Vitals Group     BP      Pulse      Resp      Temp      Temp src      SpO2      Weight 180 lb (81.6 kg)     Height 5\' 10"  (1.778 m)     Head Circumference      Peak Flow      Pain Score 0     Pain Loc      Pain Edu?      Excl. in [place]?     Most recent vital signs: There were no vitals filed for this visit.  General: Awake, no distress.  CV:  Good peripheral perfusion.  Regular rate and rhythm  Resp:  Normal effort.  Equal breath sounds bilaterally.  Abd:  No distention.  Soft, nontender.  No rebound or guarding. Other:  Patient has left greater than right lower extremity edema approximately 1+ in the left and minimal to pedal edema on the right.  No tenderness.     ED Results / Procedures / Treatments   EKG  EKG viewed and interpreted by myself shows a normal sinus rhythm 82 bpm with a narrow QRS,  normal axis, normal intervals, no concerning ST changes.  RADIOLOGY  I have reviewed and interpreted the CT head images.  No significant abnormality or bleed on my evaluation. Radiology has read the CT scan as no acute intracranial abnormality.   MEDICATIONS ORDERED IN ED: Medications - No data to display   IMPRESSION / MDM / [place] / ED COURSE  I reviewed the triage vital signs and the nursing notes.  Patient's presentation is most consistent with acute presentation with potential threat to life or bodily function.  Patient presents to the emergency department for generalized weakness and multiple falls over the past several days.  Overall the patient appears well, no distress.  He does appear somewhat weak but is able to get out of the wheelchair onto the bed as well as adjust himself in the bed.  Denies any focal weakness.  No focal deficit identified on exam but there is some generalized decrease in strength throughout.  Patient CT scan of the head is reassuringly normal.  EKG reassuring.  Lab work including CBC  chemistry and troponin are normal.  I have added on a COVID swab as a precaution.  We will also obtain a urine sample as the patient states a history of urinary infections.  States he has recently seen a urologist but does not recall why he saw the urologist.  Patient had approximately 700 cc of urine in his bladder but did not feel like he can urinate.  In and out catheter was performed, urinalysis appears normal besides a slight amount of ketones.  No sign of infection.  The remainder of the patient's lab work is reassuring as well with a negative troponin, negative COVID test, reassuring chemistry and a normal CBC including normal white blood cell count.  Imaging shows a normal CT scan of the head.  EKG is reassuring as well.  Given the patient's reassuring work-up I discussed with the patient supportive care at home including increasing the amount of fluids he is  taking and as well as increase in the amount of rest.  Patient is agreeable to plan of care and will follow-up with his doctor.  FINAL CLINICAL IMPRESSION(S) / ED DIAGNOSES   Weakness    Note:  This document was prepared using Dragon voice recognition software and may include unintentional dictation errors.   Minna Antis, MD 11/20/21 2053

## 2021-11-20 NOTE — ED Triage Notes (Signed)
Arrived by EMS from group home. No staff at home upon EMS arrival. [facility]. Patient reports three unwitnessed falls last night. No obvious injuries per EMS. Baseline uses walker and EMS reports weakness in legs.   Alert to self, place, and situation. Disoriented to time.   Patient reports seeing urologist last week  V/S WNL per EMS

## 2021-11-20 NOTE — Discharge Instructions (Signed)
You have been seen in the emergency department today for weakness.  Your work-up is reassuring.  Your labs have resulted normal, normal urinalysis and a normal CT scan of the head.  COVID test is negative.   Please drink plenty of fluids over the next 2 days.  Obtain plenty of rest and follow-up with your doctor within the next 2 days for recheck/reevaluation.  Return to the emergency department for any symptom concerning to yourself.

## 2021-11-20 NOTE — ED Notes (Signed)
Pt states that he is unable to void and feels like nothing is in him - Pt bladder scanned 73mL - EPD notified and In & Out Cath performed - >8102mL obtained - specimen sent.

## 2021-11-20 NOTE — ED Notes (Signed)
Patient stated he needed to have BM. Patient given socks to help with traction. Patient currently in restroom. Patient verbalized understanding to pull cord when he is done.

## 2022-01-21 ENCOUNTER — Ambulatory Visit: Payer: Medicare Other | Admitting: Urology

## 2022-01-22 ENCOUNTER — Ambulatory Visit: Payer: Medicare Other | Admitting: Podiatry

## 2022-03-26 ENCOUNTER — Ambulatory Visit: Payer: Medicare Other | Admitting: Urology

## 2022-05-22 ENCOUNTER — Encounter: Payer: Self-pay | Admitting: Physician Assistant

## 2022-05-22 ENCOUNTER — Ambulatory Visit: Payer: Medicare Other

## 2022-05-22 ENCOUNTER — Ambulatory Visit (INDEPENDENT_AMBULATORY_CARE_PROVIDER_SITE_OTHER): Payer: Medicare Other | Admitting: Physician Assistant

## 2022-05-22 VITALS — HR 63 | Resp 18 | Ht 70.0 in

## 2022-05-22 DIAGNOSIS — Z79899 Other long term (current) drug therapy: Secondary | ICD-10-CM | POA: Diagnosis not present

## 2022-05-22 DIAGNOSIS — R413 Other amnesia: Secondary | ICD-10-CM
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
## 2022-05-22 NOTE — Patient Instructions (Signed)
July 19 at 11:30  MRI brain Make sure that the psychiatrists work on the Seroquel dose to see if it can be weaned down  Follow closely  wit psychiatry for PTSD and Bipolar disorder

## 2022-06-14 ENCOUNTER — Other Ambulatory Visit: Payer: Medicare Other

## 2022-06-20 ENCOUNTER — Other Ambulatory Visit: Payer: Medicare Other

## 2022-07-05 ENCOUNTER — Other Ambulatory Visit: Payer: Medicare Other

## 2022-07-19 ENCOUNTER — Ambulatory Visit
Admission: RE | Admit: 2022-07-19 | Discharge: 2022-07-19 | Disposition: A | Discharge status: Home / Self Care | Payer: Medicare Other | Source: Ambulatory Visit | Attending: Physician Assistant | Admitting: Physician Assistant

## 2022-07-27 NOTE — Progress Notes (Signed)
MRI brain shows some  global atrophy especially around the temporal lobes,  and chronic vascular changes with very little change for over a decade. There is a chronic, old small stroke, in the R area of the brain, no acute findings, no new strokes, no acute bleeding or masses. Thanks

## 2022-08-11 ENCOUNTER — Ambulatory Visit: Payer: Medicare Other | Admitting: Diagnostic Neuroimaging

## 2022-08-21 ENCOUNTER — Ambulatory Visit: Payer: Medicare Other | Admitting: Physician Assistant

## 2022-08-28 ENCOUNTER — Ambulatory Visit: Payer: Medicare Other | Admitting: Physician Assistant

## 2022-10-29 ENCOUNTER — Ambulatory Visit: Payer: Medicare Other | Admitting: Urology

## 2023-09-18 LAB — LAB REPORT - SCANNED
A1c: 5.6
EGFR: 84.3
TSH: 1.36
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
## 2023-12-21 ENCOUNTER — Encounter: Payer: Self-pay | Admitting: Physician Assistant

## 2023-12-21 ENCOUNTER — Ambulatory Visit (INDEPENDENT_AMBULATORY_CARE_PROVIDER_SITE_OTHER): Admitting: Physician Assistant

## 2023-12-21 VITALS — BP 133/84 | HR 76 | Resp 20 | Wt 191.0 lb

## 2023-12-21 DIAGNOSIS — R413 Other amnesia: Secondary | ICD-10-CM | POA: Diagnosis not present

## 2023-12-21 DIAGNOSIS — R269 Unspecified abnormalities of gait and mobility: Secondary | ICD-10-CM | POA: Diagnosis not present

## 2023-12-21 NOTE — Patient Instructions (Signed)
 Mri at [facility] [phone].

## 2024-02-01 ENCOUNTER — Other Ambulatory Visit

## 2024-04-25 ENCOUNTER — Other Ambulatory Visit

## 2024-06-22 ENCOUNTER — Ambulatory Visit: Admitting: Physician Assistant
